# Patient Record
Sex: Female | Born: 1935 | Race: White | Hispanic: No | Smoking: Never smoker
Health system: Southern US, Community
[De-identification: ages and names within clinical notes are randomized; demographics above are authoritative.]

## PROBLEM LIST (undated history)

## (undated) DIAGNOSIS — N183 Chronic kidney disease, stage 3 (moderate): Secondary | ICD-10-CM

## (undated) DIAGNOSIS — K859 Acute pancreatitis without necrosis or infection, unspecified: Secondary | ICD-10-CM

## (undated) DIAGNOSIS — N185 Chronic kidney disease, stage 5: Secondary | ICD-10-CM

## (undated) DIAGNOSIS — I2510 Atherosclerotic heart disease of native coronary artery without angina pectoris: Secondary | ICD-10-CM

## (undated) DIAGNOSIS — M109 Gout, unspecified: Secondary | ICD-10-CM

## (undated) DIAGNOSIS — E119 Type 2 diabetes mellitus without complications: Secondary | ICD-10-CM

## (undated) DIAGNOSIS — I429 Cardiomyopathy, unspecified: Secondary | ICD-10-CM

## (undated) DIAGNOSIS — I4891 Unspecified atrial fibrillation: Secondary | ICD-10-CM

## (undated) DIAGNOSIS — IMO0002 Reserved for concepts with insufficient information to code with codable children: Secondary | ICD-10-CM

## (undated) DIAGNOSIS — I10 Essential (primary) hypertension: Secondary | ICD-10-CM

## (undated) HISTORY — DX: Type 2 diabetes mellitus without complications: E11.9

## (undated) HISTORY — DX: Unspecified atrial fibrillation: I48.91

## (undated) HISTORY — DX: Atherosclerotic heart disease of native coronary artery without angina pectoris: I25.10

## (undated) HISTORY — PX: TONSILLECTOMY: SUR1361

## (undated) HISTORY — DX: Chronic kidney disease, stage 5: N18.5

## (undated) HISTORY — PX: CHOLECYSTECTOMY: SHX55

## (undated) HISTORY — DX: Chronic kidney disease, stage 3 (moderate): N18.3

## (undated) HISTORY — DX: Cardiomyopathy, unspecified: I42.9

## (undated) HISTORY — DX: Essential (primary) hypertension: I10

---

## 2015-07-22 DIAGNOSIS — L89892 Pressure ulcer of other site, stage 2: Secondary | ICD-10-CM | POA: Diagnosis not present

## 2015-07-22 DIAGNOSIS — E1140 Type 2 diabetes mellitus with diabetic neuropathy, unspecified: Secondary | ICD-10-CM | POA: Diagnosis not present

## 2015-07-22 DIAGNOSIS — E1151 Type 2 diabetes mellitus with diabetic peripheral angiopathy without gangrene: Secondary | ICD-10-CM | POA: Diagnosis not present

## 2015-08-12 DIAGNOSIS — E1140 Type 2 diabetes mellitus with diabetic neuropathy, unspecified: Secondary | ICD-10-CM | POA: Diagnosis not present

## 2015-08-12 DIAGNOSIS — L89892 Pressure ulcer of other site, stage 2: Secondary | ICD-10-CM | POA: Diagnosis not present

## 2015-08-28 DIAGNOSIS — M109 Gout, unspecified: Secondary | ICD-10-CM | POA: Diagnosis not present

## 2015-08-28 DIAGNOSIS — E782 Mixed hyperlipidemia: Secondary | ICD-10-CM | POA: Diagnosis not present

## 2015-08-28 DIAGNOSIS — E1165 Type 2 diabetes mellitus with hyperglycemia: Secondary | ICD-10-CM | POA: Diagnosis not present

## 2015-08-28 DIAGNOSIS — I10 Essential (primary) hypertension: Secondary | ICD-10-CM | POA: Diagnosis not present

## 2015-09-04 DIAGNOSIS — I482 Chronic atrial fibrillation: Secondary | ICD-10-CM | POA: Diagnosis not present

## 2015-09-04 DIAGNOSIS — M545 Low back pain: Secondary | ICD-10-CM | POA: Diagnosis not present

## 2015-09-04 DIAGNOSIS — E782 Mixed hyperlipidemia: Secondary | ICD-10-CM | POA: Diagnosis not present

## 2015-09-04 DIAGNOSIS — R609 Edema, unspecified: Secondary | ICD-10-CM | POA: Diagnosis not present

## 2015-09-04 DIAGNOSIS — M109 Gout, unspecified: Secondary | ICD-10-CM | POA: Diagnosis not present

## 2015-09-04 DIAGNOSIS — E1165 Type 2 diabetes mellitus with hyperglycemia: Secondary | ICD-10-CM | POA: Diagnosis not present

## 2015-09-04 DIAGNOSIS — I10 Essential (primary) hypertension: Secondary | ICD-10-CM | POA: Diagnosis not present

## 2015-09-23 DIAGNOSIS — E1140 Type 2 diabetes mellitus with diabetic neuropathy, unspecified: Secondary | ICD-10-CM | POA: Diagnosis not present

## 2015-09-23 DIAGNOSIS — E1151 Type 2 diabetes mellitus with diabetic peripheral angiopathy without gangrene: Secondary | ICD-10-CM | POA: Diagnosis not present

## 2015-11-04 DIAGNOSIS — E1140 Type 2 diabetes mellitus with diabetic neuropathy, unspecified: Secondary | ICD-10-CM | POA: Diagnosis not present

## 2015-11-04 DIAGNOSIS — E1151 Type 2 diabetes mellitus with diabetic peripheral angiopathy without gangrene: Secondary | ICD-10-CM | POA: Diagnosis not present

## 2015-11-04 DIAGNOSIS — L89892 Pressure ulcer of other site, stage 2: Secondary | ICD-10-CM | POA: Diagnosis not present

## 2015-11-18 DIAGNOSIS — L89892 Pressure ulcer of other site, stage 2: Secondary | ICD-10-CM | POA: Diagnosis not present

## 2015-11-18 DIAGNOSIS — E1140 Type 2 diabetes mellitus with diabetic neuropathy, unspecified: Secondary | ICD-10-CM | POA: Diagnosis not present

## 2015-12-16 DIAGNOSIS — E1140 Type 2 diabetes mellitus with diabetic neuropathy, unspecified: Secondary | ICD-10-CM | POA: Diagnosis not present

## 2015-12-16 DIAGNOSIS — E1151 Type 2 diabetes mellitus with diabetic peripheral angiopathy without gangrene: Secondary | ICD-10-CM | POA: Diagnosis not present

## 2016-01-27 DIAGNOSIS — E1151 Type 2 diabetes mellitus with diabetic peripheral angiopathy without gangrene: Secondary | ICD-10-CM | POA: Diagnosis not present

## 2016-01-27 DIAGNOSIS — E1140 Type 2 diabetes mellitus with diabetic neuropathy, unspecified: Secondary | ICD-10-CM | POA: Diagnosis not present

## 2016-02-17 DIAGNOSIS — I10 Essential (primary) hypertension: Secondary | ICD-10-CM | POA: Diagnosis not present

## 2016-02-17 DIAGNOSIS — E782 Mixed hyperlipidemia: Secondary | ICD-10-CM | POA: Diagnosis not present

## 2016-02-17 DIAGNOSIS — I482 Chronic atrial fibrillation: Secondary | ICD-10-CM | POA: Diagnosis not present

## 2016-02-17 DIAGNOSIS — E1165 Type 2 diabetes mellitus with hyperglycemia: Secondary | ICD-10-CM | POA: Diagnosis not present

## 2016-02-19 DIAGNOSIS — I482 Chronic atrial fibrillation: Secondary | ICD-10-CM | POA: Diagnosis not present

## 2016-02-19 DIAGNOSIS — Z6841 Body Mass Index (BMI) 40.0 and over, adult: Secondary | ICD-10-CM | POA: Diagnosis not present

## 2016-02-19 DIAGNOSIS — M109 Gout, unspecified: Secondary | ICD-10-CM | POA: Diagnosis not present

## 2016-02-19 DIAGNOSIS — M545 Low back pain: Secondary | ICD-10-CM | POA: Diagnosis not present

## 2016-02-19 DIAGNOSIS — I10 Essential (primary) hypertension: Secondary | ICD-10-CM | POA: Diagnosis not present

## 2016-02-19 DIAGNOSIS — E782 Mixed hyperlipidemia: Secondary | ICD-10-CM | POA: Diagnosis not present

## 2016-02-19 DIAGNOSIS — R609 Edema, unspecified: Secondary | ICD-10-CM | POA: Diagnosis not present

## 2016-02-19 DIAGNOSIS — E1165 Type 2 diabetes mellitus with hyperglycemia: Secondary | ICD-10-CM | POA: Diagnosis not present

## 2016-02-26 DIAGNOSIS — H2513 Age-related nuclear cataract, bilateral: Secondary | ICD-10-CM | POA: Diagnosis not present

## 2016-02-26 DIAGNOSIS — E119 Type 2 diabetes mellitus without complications: Secondary | ICD-10-CM | POA: Diagnosis not present

## 2016-03-23 DIAGNOSIS — E1140 Type 2 diabetes mellitus with diabetic neuropathy, unspecified: Secondary | ICD-10-CM | POA: Diagnosis not present

## 2016-03-23 DIAGNOSIS — E1151 Type 2 diabetes mellitus with diabetic peripheral angiopathy without gangrene: Secondary | ICD-10-CM | POA: Diagnosis not present

## 2016-06-22 DIAGNOSIS — I10 Essential (primary) hypertension: Secondary | ICD-10-CM | POA: Diagnosis not present

## 2016-06-22 DIAGNOSIS — R609 Edema, unspecified: Secondary | ICD-10-CM | POA: Diagnosis not present

## 2016-06-22 DIAGNOSIS — I482 Chronic atrial fibrillation: Secondary | ICD-10-CM | POA: Diagnosis not present

## 2016-06-22 DIAGNOSIS — E782 Mixed hyperlipidemia: Secondary | ICD-10-CM | POA: Diagnosis not present

## 2016-06-22 DIAGNOSIS — E1165 Type 2 diabetes mellitus with hyperglycemia: Secondary | ICD-10-CM | POA: Diagnosis not present

## 2016-06-22 DIAGNOSIS — M109 Gout, unspecified: Secondary | ICD-10-CM | POA: Diagnosis not present

## 2016-06-24 DIAGNOSIS — I482 Chronic atrial fibrillation: Secondary | ICD-10-CM | POA: Diagnosis not present

## 2016-06-24 DIAGNOSIS — M109 Gout, unspecified: Secondary | ICD-10-CM | POA: Diagnosis not present

## 2016-06-24 DIAGNOSIS — E782 Mixed hyperlipidemia: Secondary | ICD-10-CM | POA: Diagnosis not present

## 2016-06-24 DIAGNOSIS — R609 Edema, unspecified: Secondary | ICD-10-CM | POA: Diagnosis not present

## 2016-06-24 DIAGNOSIS — E1165 Type 2 diabetes mellitus with hyperglycemia: Secondary | ICD-10-CM | POA: Diagnosis not present

## 2016-06-24 DIAGNOSIS — R079 Chest pain, unspecified: Secondary | ICD-10-CM | POA: Diagnosis not present

## 2016-06-24 DIAGNOSIS — M545 Low back pain: Secondary | ICD-10-CM | POA: Diagnosis not present

## 2016-06-24 DIAGNOSIS — I10 Essential (primary) hypertension: Secondary | ICD-10-CM | POA: Diagnosis not present

## 2016-08-26 DIAGNOSIS — I10 Essential (primary) hypertension: Secondary | ICD-10-CM | POA: Diagnosis not present

## 2016-08-26 DIAGNOSIS — E11621 Type 2 diabetes mellitus with foot ulcer: Secondary | ICD-10-CM | POA: Diagnosis not present

## 2016-08-29 ENCOUNTER — Inpatient Hospital Stay (HOSPITAL_COMMUNITY)
Admission: EM | Admit: 2016-08-29 | Discharge: 2016-09-16 | DRG: 246 | Disposition: A | Discharge status: Home Health | Payer: PPO | Attending: Family Medicine | Admitting: Family Medicine

## 2016-08-29 ENCOUNTER — Encounter (HOSPITAL_COMMUNITY): Payer: Self-pay | Admitting: Emergency Medicine

## 2016-08-29 ENCOUNTER — Emergency Department (HOSPITAL_COMMUNITY): Payer: PPO

## 2016-08-29 DIAGNOSIS — N184 Chronic kidney disease, stage 4 (severe): Secondary | ICD-10-CM | POA: Diagnosis not present

## 2016-08-29 DIAGNOSIS — Z79899 Other long term (current) drug therapy: Secondary | ICD-10-CM

## 2016-08-29 DIAGNOSIS — I25110 Atherosclerotic heart disease of native coronary artery with unstable angina pectoris: Principal | ICD-10-CM | POA: Diagnosis present

## 2016-08-29 DIAGNOSIS — I482 Chronic atrial fibrillation: Secondary | ICD-10-CM | POA: Diagnosis present

## 2016-08-29 DIAGNOSIS — E1151 Type 2 diabetes mellitus with diabetic peripheral angiopathy without gangrene: Secondary | ICD-10-CM | POA: Diagnosis not present

## 2016-08-29 DIAGNOSIS — N170 Acute kidney failure with tubular necrosis: Secondary | ICD-10-CM | POA: Diagnosis not present

## 2016-08-29 DIAGNOSIS — I248 Other forms of acute ischemic heart disease: Secondary | ICD-10-CM | POA: Diagnosis not present

## 2016-08-29 DIAGNOSIS — Z955 Presence of coronary angioplasty implant and graft: Secondary | ICD-10-CM

## 2016-08-29 DIAGNOSIS — I97630 Postprocedural hematoma of a circulatory system organ or structure following a cardiac catheterization: Secondary | ICD-10-CM | POA: Diagnosis not present

## 2016-08-29 DIAGNOSIS — D62 Acute posthemorrhagic anemia: Secondary | ICD-10-CM | POA: Diagnosis not present

## 2016-08-29 DIAGNOSIS — Z6841 Body Mass Index (BMI) 40.0 and over, adult: Secondary | ICD-10-CM

## 2016-08-29 DIAGNOSIS — I509 Heart failure, unspecified: Secondary | ICD-10-CM

## 2016-08-29 DIAGNOSIS — I5021 Acute systolic (congestive) heart failure: Secondary | ICD-10-CM

## 2016-08-29 DIAGNOSIS — I2583 Coronary atherosclerosis due to lipid rich plaque: Secondary | ICD-10-CM | POA: Diagnosis not present

## 2016-08-29 DIAGNOSIS — I959 Hypotension, unspecified: Secondary | ICD-10-CM | POA: Diagnosis not present

## 2016-08-29 DIAGNOSIS — I4819 Other persistent atrial fibrillation: Secondary | ICD-10-CM | POA: Diagnosis present

## 2016-08-29 DIAGNOSIS — R262 Difficulty in walking, not elsewhere classified: Secondary | ICD-10-CM | POA: Diagnosis not present

## 2016-08-29 DIAGNOSIS — R0602 Shortness of breath: Secondary | ICD-10-CM | POA: Diagnosis not present

## 2016-08-29 DIAGNOSIS — Z7901 Long term (current) use of anticoagulants: Secondary | ICD-10-CM

## 2016-08-29 DIAGNOSIS — E1165 Type 2 diabetes mellitus with hyperglycemia: Secondary | ICD-10-CM | POA: Diagnosis not present

## 2016-08-29 DIAGNOSIS — R609 Edema, unspecified: Secondary | ICD-10-CM | POA: Diagnosis not present

## 2016-08-29 DIAGNOSIS — I130 Hypertensive heart and chronic kidney disease with heart failure and stage 1 through stage 4 chronic kidney disease, or unspecified chronic kidney disease: Secondary | ICD-10-CM | POA: Diagnosis present

## 2016-08-29 DIAGNOSIS — I5043 Acute on chronic combined systolic (congestive) and diastolic (congestive) heart failure: Secondary | ICD-10-CM | POA: Diagnosis present

## 2016-08-29 DIAGNOSIS — E1159 Type 2 diabetes mellitus with other circulatory complications: Secondary | ICD-10-CM | POA: Diagnosis not present

## 2016-08-29 DIAGNOSIS — L97509 Non-pressure chronic ulcer of other part of unspecified foot with unspecified severity: Secondary | ICD-10-CM

## 2016-08-29 DIAGNOSIS — Z7902 Long term (current) use of antithrombotics/antiplatelets: Secondary | ICD-10-CM

## 2016-08-29 DIAGNOSIS — R0600 Dyspnea, unspecified: Secondary | ICD-10-CM | POA: Diagnosis not present

## 2016-08-29 DIAGNOSIS — Z794 Long term (current) use of insulin: Secondary | ICD-10-CM | POA: Diagnosis not present

## 2016-08-29 DIAGNOSIS — I200 Unstable angina: Secondary | ICD-10-CM

## 2016-08-29 DIAGNOSIS — Z8249 Family history of ischemic heart disease and other diseases of the circulatory system: Secondary | ICD-10-CM

## 2016-08-29 DIAGNOSIS — I70208 Unspecified atherosclerosis of native arteries of extremities, other extremity: Secondary | ICD-10-CM | POA: Diagnosis not present

## 2016-08-29 DIAGNOSIS — S7001XA Contusion of right hip, initial encounter: Secondary | ICD-10-CM | POA: Diagnosis not present

## 2016-08-29 DIAGNOSIS — L97519 Non-pressure chronic ulcer of other part of right foot with unspecified severity: Secondary | ICD-10-CM | POA: Diagnosis present

## 2016-08-29 DIAGNOSIS — E1122 Type 2 diabetes mellitus with diabetic chronic kidney disease: Secondary | ICD-10-CM | POA: Diagnosis not present

## 2016-08-29 DIAGNOSIS — I481 Persistent atrial fibrillation: Secondary | ICD-10-CM | POA: Diagnosis not present

## 2016-08-29 DIAGNOSIS — I9789 Other postprocedural complications and disorders of the circulatory system, not elsewhere classified: Secondary | ICD-10-CM | POA: Diagnosis not present

## 2016-08-29 DIAGNOSIS — E1169 Type 2 diabetes mellitus with other specified complication: Secondary | ICD-10-CM

## 2016-08-29 DIAGNOSIS — I2510 Atherosclerotic heart disease of native coronary artery without angina pectoris: Secondary | ICD-10-CM

## 2016-08-29 DIAGNOSIS — R197 Diarrhea, unspecified: Secondary | ICD-10-CM | POA: Diagnosis not present

## 2016-08-29 DIAGNOSIS — N179 Acute kidney failure, unspecified: Secondary | ICD-10-CM | POA: Diagnosis not present

## 2016-08-29 DIAGNOSIS — Z23 Encounter for immunization: Secondary | ICD-10-CM | POA: Diagnosis not present

## 2016-08-29 DIAGNOSIS — I5023 Acute on chronic systolic (congestive) heart failure: Secondary | ICD-10-CM | POA: Diagnosis not present

## 2016-08-29 DIAGNOSIS — S301XXA Contusion of abdominal wall, initial encounter: Secondary | ICD-10-CM | POA: Diagnosis not present

## 2016-08-29 DIAGNOSIS — I97410 Intraoperative hemorrhage and hematoma of a circulatory system organ or structure complicating a cardiac catheterization: Secondary | ICD-10-CM | POA: Diagnosis not present

## 2016-08-29 DIAGNOSIS — N183 Chronic kidney disease, stage 3 (moderate): Secondary | ICD-10-CM | POA: Diagnosis not present

## 2016-08-29 DIAGNOSIS — M109 Gout, unspecified: Secondary | ICD-10-CM | POA: Diagnosis present

## 2016-08-29 DIAGNOSIS — R0601 Orthopnea: Secondary | ICD-10-CM | POA: Diagnosis not present

## 2016-08-29 DIAGNOSIS — E7800 Pure hypercholesterolemia, unspecified: Secondary | ICD-10-CM | POA: Diagnosis not present

## 2016-08-29 DIAGNOSIS — E11621 Type 2 diabetes mellitus with foot ulcer: Secondary | ICD-10-CM | POA: Diagnosis not present

## 2016-08-29 DIAGNOSIS — E119 Type 2 diabetes mellitus without complications: Secondary | ICD-10-CM

## 2016-08-29 DIAGNOSIS — I252 Old myocardial infarction: Secondary | ICD-10-CM

## 2016-08-29 DIAGNOSIS — Z7982 Long term (current) use of aspirin: Secondary | ICD-10-CM

## 2016-08-29 HISTORY — DX: Acute pancreatitis without necrosis or infection, unspecified: K85.90

## 2016-08-29 HISTORY — DX: Gout, unspecified: M10.9

## 2016-08-29 HISTORY — DX: Reserved for concepts with insufficient information to code with codable children: IMO0002

## 2016-08-29 LAB — CBC
HCT: 42.6 % (ref 36.0–46.0)
Hemoglobin: 13.8 g/dL (ref 12.0–15.0)
MCH: 28.5 pg (ref 26.0–34.0)
MCHC: 32.4 g/dL (ref 30.0–36.0)
MCV: 87.8 fL (ref 78.0–100.0)
PLATELETS: 245 10*3/uL (ref 150–400)
RBC: 4.85 MIL/uL (ref 3.87–5.11)
RDW: 14.7 % (ref 11.5–15.5)
WBC: 10.1 10*3/uL (ref 4.0–10.5)

## 2016-08-29 LAB — I-STAT TROPONIN, ED: TROPONIN I, POC: 0.03 ng/mL (ref 0.00–0.08)

## 2016-08-29 LAB — BASIC METABOLIC PANEL
Anion gap: 10 (ref 5–15)
BUN: 35 mg/dL — ABNORMAL HIGH (ref 6–20)
CALCIUM: 9.2 mg/dL (ref 8.9–10.3)
CO2: 21 mmol/L — ABNORMAL LOW (ref 22–32)
CREATININE: 1.64 mg/dL — AB (ref 0.44–1.00)
Chloride: 103 mmol/L (ref 101–111)
GFR, EST AFRICAN AMERICAN: 33 mL/min — AB (ref >60)
GFR, EST NON AFRICAN AMERICAN: 29 mL/min — AB (ref >60)
Glucose, Bld: 285 mg/dL — ABNORMAL HIGH (ref 65–99)
Potassium: 3.9 mmol/L (ref 3.5–5.1)
SODIUM: 134 mmol/L — AB (ref 135–145)

## 2016-08-29 LAB — BRAIN NATRIURETIC PEPTIDE: B Natriuretic Peptide: 862.2 pg/mL — ABNORMAL HIGH (ref 0.0–100.0)

## 2016-08-29 MED ORDER — NITROGLYCERIN 2 % TD OINT
1.0000 [in_us] | TOPICAL_OINTMENT | Freq: Once | TRANSDERMAL | Status: AC
  Administered 2016-08-29: 1 [in_us] via TOPICAL
  Filled 2016-08-29: qty 1

## 2016-08-29 MED ORDER — FUROSEMIDE 10 MG/ML IJ SOLN
40.0000 mg | Freq: Once | INTRAMUSCULAR | Status: AC
  Administered 2016-08-29: 40 mg via INTRAVENOUS
  Filled 2016-08-29: qty 4

## 2016-08-29 MED ORDER — METOPROLOL TARTRATE 12.5 MG HALF TABLET
12.5000 mg | ORAL_TABLET | Freq: Four times a day (QID) | ORAL | Status: DC
  Administered 2016-08-30 (×2): 12.5 mg via ORAL
  Filled 2016-08-29 (×2): qty 1

## 2016-08-29 MED ORDER — ONDANSETRON HCL 4 MG/2ML IJ SOLN: 4.0000 mg | Freq: Four times a day (QID) | INTRAMUSCULAR | Status: DC | PRN

## 2016-08-29 MED ORDER — SODIUM CHLORIDE 0.9 % IV SOLN: 250.0000 mL | INTRAVENOUS | Status: DC | PRN

## 2016-08-29 MED ORDER — INSULIN GLARGINE 100 UNIT/ML ~~LOC~~ SOLN
10.0000 [IU] | Freq: Every day | SUBCUTANEOUS | Status: DC
  Administered 2016-08-30: 10 [IU] via SUBCUTANEOUS
  Filled 2016-08-29: qty 0.1

## 2016-08-29 MED ORDER — SODIUM CHLORIDE 0.9% FLUSH
3.0000 mL | Freq: Two times a day (BID) | INTRAVENOUS | Status: DC
  Administered 2016-08-30 – 2016-09-07 (×12): 3 mL via INTRAVENOUS

## 2016-08-29 MED ORDER — SODIUM CHLORIDE 0.9% FLUSH: 3.0000 mL | INTRAVENOUS | Status: DC | PRN

## 2016-08-29 MED ORDER — ALLOPURINOL 300 MG PO TABS
300.0000 mg | ORAL_TABLET | Freq: Every day | ORAL | Status: DC
  Administered 2016-08-30 – 2016-09-16 (×17): 300 mg via ORAL
  Filled 2016-08-29 (×17): qty 1

## 2016-08-29 MED ORDER — ACETAMINOPHEN 325 MG PO TABS: 650.0000 mg | ORAL_TABLET | ORAL | Status: DC | PRN

## 2016-08-29 MED ORDER — APIXABAN 5 MG PO TABS: 5.0000 mg | ORAL_TABLET | Freq: Every day | ORAL | Status: DC

## 2016-08-29 MED ORDER — APIXABAN 2.5 MG PO TABS
2.5000 mg | ORAL_TABLET | Freq: Two times a day (BID) | ORAL | Status: DC
  Administered 2016-08-30 – 2016-08-31 (×4): 2.5 mg via ORAL
  Filled 2016-08-29 (×4): qty 1

## 2016-08-29 MED ORDER — FUROSEMIDE 10 MG/ML IJ SOLN
40.0000 mg | Freq: Every day | INTRAMUSCULAR | Status: DC
  Administered 2016-08-30: 40 mg via INTRAVENOUS
  Filled 2016-08-29: qty 4

## 2016-08-29 MED ORDER — INSULIN ASPART 100 UNIT/ML ~~LOC~~ SOLN
0.0000 [IU] | Freq: Three times a day (TID) | SUBCUTANEOUS | Status: DC
  Administered 2016-08-30: 3 [IU] via SUBCUTANEOUS
  Administered 2016-08-30: 7 [IU] via SUBCUTANEOUS
  Administered 2016-08-30 – 2016-08-31 (×3): 3 [IU] via SUBCUTANEOUS
  Administered 2016-08-31: 5 [IU] via SUBCUTANEOUS
  Administered 2016-09-01: 3 [IU] via SUBCUTANEOUS

## 2016-08-29 NOTE — ED Provider Notes (Signed)
[facility] DEPT Provider Note   CSN: [id number] Arrival date & time: 08/29/16  1844     History   Chief Complaint Chief Complaint  Patient presents with  . Shortness of Breath  . Leg Swelling    HPI Kristina Howell is a 81 y.o. female.  This a 81 year old female who is morbidly obese with number of chronic medical conditions which include hypertension, diabetes, atrial fibrillation who reports that for the past 3 weeks.  She's had progressively increasing shortness of breath.  She's had to use more pillows at night to sleep comfortably.  She states for the past 2 days.  She's been unable to ambulate due to the shortness of breath. She does have a diabetic ulcer on her right foot and has been referred to the wound care clinic by her PCP.       Past Medical History:  Diagnosis Date  . A-fib ([place])   . Diabetes mellitus without complication ([place])   . Gout   . Hypertension   . Pancreatitis, recurrent [facility])     Patient Active Problem List   Diagnosis Date Noted  . Coronary artery disease involving native coronary artery of native heart with unstable angina pectoris ([place])   . Coronary artery disease due to lipid rich plaque   . Chronic anticoagulation   . Acute systolic heart failure ([place])   . Unstable angina ([place])   . Type 2 diabetes mellitus with diabetic foot ulcer ([place]) 08/30/2016  . Acute kidney injury ([place]) 08/30/2016  . Dyspnea 08/30/2016  . Peripheral edema   . Diabetes mellitus ([place])   . Persistent atrial fibrillation ([place])   . Morbidly obese [facility])     Past Surgical History:  Procedure Laterality Date  . CHOLECYSTECTOMY    . CORONARY STENT INTERVENTION N/A 09/08/2016   Procedure: Coronary Stent Intervention;  Surgeon: Belva Crome, MD;  Location: [facility] CV LAB;  Service: Cardiovascular;  Laterality: N/A;  . LEFT HEART CATH AND CORONARY ANGIOGRAPHY N/A 09/03/2016   Procedure: Left Heart Cath and Coronary Angiography;  Surgeon: Nelva Bush, MD;   Location: [place] CV LAB;  Service: Cardiovascular;  Laterality: N/A;  . TONSILLECTOMY      OB History    No data available       Home Medications    Prior to Admission medications   Medication Sig Start Date End Date Taking? Authorizing Provider  allopurinol (ZYLOPRIM) 300 MG tablet Take 300 mg by mouth daily.   Yes Historical Provider, MD  apixaban (ELIQUIS) 5 MG TABS tablet Take 5 mg by mouth daily.   Yes Historical Provider, MD  benazepril (LOTENSIN) 40 MG tablet Take 40 mg by mouth daily. 08/26/16  Yes Historical Provider, MD  furosemide (LASIX) 20 MG tablet Take 20 mg by mouth daily. 08/11/16  Yes Historical Provider, MD  insulin glargine (LANTUS) 100 UNIT/ML injection Inject 20 Units into the skin at bedtime.   Yes Historical Provider, MD  metoprolol succinate (TOPROL-XL) 50 MG 24 hr tablet Take 50 mg by mouth at bedtime. 08/26/16  Yes Historical Provider, MD    Family History Family History  Problem Relation Age of Onset  . Heart disease Father     Social History Social History  Substance Use Topics  . Smoking status: Never Smoker  . Smokeless tobacco: Never Used  . Alcohol use No     Allergies   Patient has no known allergies.   Review of Systems Review of Systems  Constitutional: Negative for fever.  Respiratory: Positive for shortness of breath. Negative for cough.   Cardiovascular: Positive for leg swelling. Negative for chest pain.  Skin: Positive for wound.  All other systems reviewed and are negative.    Physical Exam Updated Vital Signs BP (!) 122/45   Pulse 73   Temp 97.6 F (36.4 C) (Oral)   Resp (!) 22   Ht 5\' 6"  (1.676 m)   Wt 129.6 kg   SpO2 99%   BMI 46.12 kg/m   Physical Exam  Constitutional: She appears well-developed and well-nourished.  Eyes: Pupils are equal, round, and reactive to light.  Cardiovascular: Normal rate.   Pulmonary/Chest: Effort normal.  Musculoskeletal: She exhibits edema and tenderness.        Legs: Neurological: She is alert.  Skin: Skin is warm. There is pallor.  Nursing note and vitals reviewed.    ED Treatments / Results  Labs (all labs ordered are listed, but only abnormal results are displayed) Labs Reviewed  BASIC METABOLIC PANEL - Abnormal; Notable for the following:       Result Value   Sodium 134 (*)    CO2 21 (*)    Glucose, Bld 285 (*)    BUN 35 (*)    Creatinine, Ser 1.64 (*)    GFR calc non Af Amer 29 (*)    GFR calc Af Amer 33 (*)    All other components within normal limits  BRAIN NATRIURETIC PEPTIDE - Abnormal; Notable for the following:    B Natriuretic Peptide 862.2 (*)    All other components within normal limits  BASIC METABOLIC PANEL - Abnormal; Notable for the following:    Potassium 3.4 (*)    Glucose, Bld 246 (*)    BUN 32 (*)    Creatinine, Ser 1.58 (*)    GFR calc non Af Amer 30 (*)    GFR calc Af Amer 35 (*)    All other components within normal limits  HEMOGLOBIN A1C - Abnormal; Notable for the following:    Hgb A1c MFr Bld 10.3 (*)    All other components within normal limits  TROPONIN I - Abnormal; Notable for the following:    Troponin I 0.05 (*)    All other components within normal limits  TROPONIN I - Abnormal; Notable for the following:    Troponin I 0.05 (*)    All other components within normal limits  LIPID PANEL - Abnormal; Notable for the following:    Cholesterol 242 (*)    HDL 36 (*)    LDL Cholesterol 180 (*)    All other components within normal limits  GLUCOSE, CAPILLARY - Abnormal; Notable for the following:    Glucose-Capillary 206 (*)    All other components within normal limits  GLUCOSE, CAPILLARY - Abnormal; Notable for the following:    Glucose-Capillary 288 (*)    All other components within normal limits  GLUCOSE, CAPILLARY - Abnormal; Notable for the following:    Glucose-Capillary 240 (*)    All other components within normal limits  GLUCOSE, CAPILLARY - Abnormal; Notable for the following:     Glucose-Capillary 302 (*)    All other components within normal limits  BASIC METABOLIC PANEL - Abnormal; Notable for the following:    Glucose, Bld 216 (*)    BUN 32 (*)    Creatinine, Ser 1.72 (*)    GFR calc non Af Amer 27 (*)    GFR calc Af Amer 32 (*)    All other  components within normal limits  GLUCOSE, CAPILLARY - Abnormal; Notable for the following:    Glucose-Capillary 314 (*)    All other components within normal limits  GLUCOSE, CAPILLARY - Abnormal; Notable for the following:    Glucose-Capillary 213 (*)    All other components within normal limits  GLUCOSE, CAPILLARY - Abnormal; Notable for the following:    Glucose-Capillary 254 (*)    All other components within normal limits  BASIC METABOLIC PANEL - Abnormal; Notable for the following:    Glucose, Bld 271 (*)    BUN 38 (*)    Creatinine, Ser 1.87 (*)    Calcium 8.8 (*)    GFR calc non Af Amer 25 (*)    GFR calc Af Amer 29 (*)    All other components within normal limits  HEPARIN LEVEL (UNFRACTIONATED) - Abnormal; Notable for the following:    Heparin Unfractionated 1.05 (*)    All other components within normal limits  GLUCOSE, CAPILLARY - Abnormal; Notable for the following:    Glucose-Capillary 230 (*)    All other components within normal limits  GLUCOSE, CAPILLARY - Abnormal; Notable for the following:    Glucose-Capillary 388 (*)    All other components within normal limits  APTT - Abnormal; Notable for the following:    aPTT 48 (*)    All other components within normal limits  APTT - Abnormal; Notable for the following:    aPTT 114 (*)    All other components within normal limits  GLUCOSE, CAPILLARY - Abnormal; Notable for the following:    Glucose-Capillary 250 (*)    All other components within normal limits  GLUCOSE, CAPILLARY - Abnormal; Notable for the following:    Glucose-Capillary 246 (*)    All other components within normal limits  APTT - Abnormal; Notable for the following:    aPTT 61  (*)    All other components within normal limits  GLUCOSE, CAPILLARY - Abnormal; Notable for the following:    Glucose-Capillary 278 (*)    All other components within normal limits  HEPARIN LEVEL (UNFRACTIONATED) - Abnormal; Notable for the following:    Heparin Unfractionated 0.99 (*)    All other components within normal limits  GLUCOSE, CAPILLARY - Abnormal; Notable for the following:    Glucose-Capillary 283 (*)    All other components within normal limits  APTT - Abnormal; Notable for the following:    aPTT 139 (*)    All other components within normal limits  GLUCOSE, CAPILLARY - Abnormal; Notable for the following:    Glucose-Capillary 208 (*)    All other components within normal limits  APTT - Abnormal; Notable for the following:    aPTT 122 (*)    All other components within normal limits  HEPARIN LEVEL (UNFRACTIONATED) - Abnormal; Notable for the following:    Heparin Unfractionated 0.90 (*)    All other components within normal limits  BASIC METABOLIC PANEL - Abnormal; Notable for the following:    Glucose, Bld 230 (*)    BUN 39 (*)    Creatinine, Ser 1.91 (*)    GFR calc non Af Amer 24 (*)    GFR calc Af Amer 28 (*)    All other components within normal limits  GLUCOSE, CAPILLARY - Abnormal; Notable for the following:    Glucose-Capillary 194 (*)    All other components within normal limits  APTT - Abnormal; Notable for the following:    aPTT 119 (*)  All other components within normal limits  GLUCOSE, CAPILLARY - Abnormal; Notable for the following:    Glucose-Capillary 242 (*)    All other components within normal limits  BASIC METABOLIC PANEL - Abnormal; Notable for the following:    Glucose, Bld 262 (*)    BUN 42 (*)    Creatinine, Ser 1.82 (*)    Calcium 8.6 (*)    GFR calc non Af Amer 25 (*)    GFR calc Af Amer 30 (*)    All other components within normal limits  HEPARIN LEVEL (UNFRACTIONATED) - Abnormal; Notable for the following:    Heparin  Unfractionated 0.74 (*)    All other components within normal limits  CBC - Abnormal; Notable for the following:    Hemoglobin 11.7 (*)    All other components within normal limits  HEPATIC FUNCTION PANEL - Abnormal; Notable for the following:    Total Protein 5.5 (*)    Albumin 2.5 (*)    Bilirubin, Direct <0.1 (*)    All other components within normal limits  GLUCOSE, CAPILLARY - Abnormal; Notable for the following:    Glucose-Capillary 244 (*)    All other components within normal limits  GLUCOSE, CAPILLARY - Abnormal; Notable for the following:    Glucose-Capillary 259 (*)    All other components within normal limits  APTT - Abnormal; Notable for the following:    aPTT 87 (*)    All other components within normal limits  BASIC METABOLIC PANEL - Abnormal; Notable for the following:    Sodium 134 (*)    CO2 21 (*)    Glucose, Bld 249 (*)    BUN 40 (*)    Creatinine, Ser 1.78 (*)    Calcium 8.8 (*)    GFR calc non Af Amer 26 (*)    GFR calc Af Amer 30 (*)    All other components within normal limits  GLUCOSE, CAPILLARY - Abnormal; Notable for the following:    Glucose-Capillary 242 (*)    All other components within normal limits  GLUCOSE, CAPILLARY - Abnormal; Notable for the following:    Glucose-Capillary 225 (*)    All other components within normal limits  GLUCOSE, CAPILLARY - Abnormal; Notable for the following:    Glucose-Capillary 169 (*)    All other components within normal limits  HEPARIN LEVEL (UNFRACTIONATED) - Abnormal; Notable for the following:    Heparin Unfractionated 0.19 (*)    All other components within normal limits  APTT - Abnormal; Notable for the following:    aPTT 49 (*)    All other components within normal limits  GLUCOSE, CAPILLARY - Abnormal; Notable for the following:    Glucose-Capillary 256 (*)    All other components within normal limits  CBC - Abnormal; Notable for the following:    Hemoglobin 11.9 (*)    All other components  within normal limits  BASIC METABOLIC PANEL - Abnormal; Notable for the following:    Glucose, Bld 207 (*)    BUN 36 (*)    Creatinine, Ser 1.78 (*)    GFR calc non Af Amer 26 (*)    GFR calc Af Amer 30 (*)    All other components within normal limits  GLUCOSE, CAPILLARY - Abnormal; Notable for the following:    Glucose-Capillary 227 (*)    All other components within normal limits  HEPARIN LEVEL (UNFRACTIONATED) - Abnormal; Notable for the following:    Heparin Unfractionated 0.29 (*)  All other components within normal limits  GLUCOSE, CAPILLARY - Abnormal; Notable for the following:    Glucose-Capillary 186 (*)    All other components within normal limits  GLUCOSE, CAPILLARY - Abnormal; Notable for the following:    Glucose-Capillary 230 (*)    All other components within normal limits  GLUCOSE, CAPILLARY - Abnormal; Notable for the following:    Glucose-Capillary 251 (*)    All other components within normal limits  BASIC METABOLIC PANEL - Abnormal; Notable for the following:    Glucose, Bld 211 (*)    BUN 40 (*)    Creatinine, Ser 2.05 (*)    GFR calc non Af Amer 22 (*)    GFR calc Af Amer 26 (*)    All other components within normal limits  GLUCOSE, CAPILLARY - Abnormal; Notable for the following:    Glucose-Capillary 271 (*)    All other components within normal limits  GLUCOSE, CAPILLARY - Abnormal; Notable for the following:    Glucose-Capillary 196 (*)    All other components within normal limits  GLUCOSE, CAPILLARY - Abnormal; Notable for the following:    Glucose-Capillary 272 (*)    All other components within normal limits  GLUCOSE, CAPILLARY - Abnormal; Notable for the following:    Glucose-Capillary 278 (*)    All other components within normal limits  GLUCOSE, CAPILLARY - Abnormal; Notable for the following:    Glucose-Capillary 287 (*)    All other components within normal limits  GLUCOSE, CAPILLARY - Abnormal; Notable for the following:     Glucose-Capillary 212 (*)    All other components within normal limits  BASIC METABOLIC PANEL - Abnormal; Notable for the following:    Chloride 100 (*)    Glucose, Bld 258 (*)    BUN 37 (*)    Creatinine, Ser 1.92 (*)    GFR calc non Af Amer 24 (*)    GFR calc Af Amer 28 (*)    All other components within normal limits  BASIC METABOLIC PANEL - Abnormal; Notable for the following:    Chloride 98 (*)    Glucose, Bld 295 (*)    BUN 39 (*)    Creatinine, Ser 2.08 (*)    GFR calc non Af Amer 22 (*)    GFR calc Af Amer 25 (*)    All other components within normal limits  GLUCOSE, CAPILLARY - Abnormal; Notable for the following:    Glucose-Capillary 244 (*)    All other components within normal limits  GLUCOSE, CAPILLARY - Abnormal; Notable for the following:    Glucose-Capillary 316 (*)    All other components within normal limits  BASIC METABOLIC PANEL - Abnormal; Notable for the following:    Chloride 100 (*)    Glucose, Bld 165 (*)    BUN 36 (*)    Creatinine, Ser 1.78 (*)    GFR calc non Af Amer 26 (*)    GFR calc Af Amer 30 (*)    All other components within normal limits  CBC - Abnormal; Notable for the following:    Hemoglobin 11.9 (*)    All other components within normal limits  GLUCOSE, CAPILLARY - Abnormal; Notable for the following:    Glucose-Capillary 219 (*)    All other components within normal limits  GLUCOSE, CAPILLARY - Abnormal; Notable for the following:    Glucose-Capillary 141 (*)    All other components within normal limits  GLUCOSE, CAPILLARY - Abnormal; Notable for the  following:    Glucose-Capillary 174 (*)    All other components within normal limits  BASIC METABOLIC PANEL - Abnormal; Notable for the following:    Glucose, Bld 210 (*)    BUN 32 (*)    Creatinine, Ser 1.84 (*)    GFR calc non Af Amer 25 (*)    GFR calc Af Amer 29 (*)    All other components within normal limits  CBC - Abnormal; Notable for the following:    Hemoglobin 11.5 (*)     All other components within normal limits  GLUCOSE, CAPILLARY - Abnormal; Notable for the following:    Glucose-Capillary 263 (*)    All other components within normal limits  GLUCOSE, CAPILLARY - Abnormal; Notable for the following:    Glucose-Capillary 278 (*)    All other components within normal limits  GLUCOSE, CAPILLARY - Abnormal; Notable for the following:    Glucose-Capillary 189 (*)    All other components within normal limits  GLUCOSE, CAPILLARY - Abnormal; Notable for the following:    Glucose-Capillary 187 (*)    All other components within normal limits  CBC - Abnormal; Notable for the following:    WBC 17.1 (*)    RBC 3.38 (*)    Hemoglobin 9.6 (*)    HCT 29.9 (*)    All other components within normal limits  BASIC METABOLIC PANEL - Abnormal; Notable for the following:    CO2 21 (*)    Glucose, Bld 269 (*)    BUN 35 (*)    Creatinine, Ser 2.14 (*)    Calcium 8.8 (*)    GFR calc non Af Amer 21 (*)    GFR calc Af Amer 24 (*)    All other components within normal limits  CBC - Abnormal; Notable for the following:    WBC 15.0 (*)    RBC 3.72 (*)    Hemoglobin 10.5 (*)    HCT 33.1 (*)    All other components within normal limits  GLUCOSE, CAPILLARY - Abnormal; Notable for the following:    Glucose-Capillary 255 (*)    All other components within normal limits  GLUCOSE, CAPILLARY - Abnormal; Notable for the following:    Glucose-Capillary 274 (*)    All other components within normal limits  CBC WITH DIFFERENTIAL/PLATELET - Abnormal; Notable for the following:    WBC 13.2 (*)    RBC 3.16 (*)    Hemoglobin 9.0 (*)    HCT 27.8 (*)    Neutro Abs 11.1 (*)    All other components within normal limits  BASIC METABOLIC PANEL - Abnormal; Notable for the following:    Glucose, Bld 213 (*)    BUN 38 (*)    Creatinine, Ser 2.32 (*)    Calcium 8.7 (*)    GFR calc non Af Amer 19 (*)    GFR calc Af Amer 22 (*)    All other components within normal limits  CBC -  Abnormal; Notable for the following:    WBC 12.6 (*)    RBC 2.99 (*)    Hemoglobin 8.5 (*)    HCT 26.2 (*)    All other components within normal limits  GLUCOSE, CAPILLARY - Abnormal; Notable for the following:    Glucose-Capillary 217 (*)    All other components within normal limits  CBC - Abnormal; Notable for the following:    WBC 13.4 (*)    RBC 2.64 (*)    Hemoglobin 7.5 (*)  HCT 23.3 (*)    RDW 16.0 (*)    All other components within normal limits  GLUCOSE, CAPILLARY - Abnormal; Notable for the following:    Glucose-Capillary 171 (*)    All other components within normal limits  GLUCOSE, CAPILLARY - Abnormal; Notable for the following:    Glucose-Capillary 276 (*)    All other components within normal limits  CBC - Abnormal; Notable for the following:    WBC 12.2 (*)    RBC 2.85 (*)    Hemoglobin 8.2 (*)    HCT 25.1 (*)    All other components within normal limits  BASIC METABOLIC PANEL - Abnormal; Notable for the following:    Sodium 134 (*)    Glucose, Bld 247 (*)    BUN 44 (*)    Creatinine, Ser 2.41 (*)    Calcium 8.6 (*)    GFR calc non Af Amer 18 (*)    GFR calc Af Amer 21 (*)    All other components within normal limits  HEMOGLOBIN AND HEMATOCRIT, BLOOD - Abnormal; Notable for the following:    Hemoglobin 8.7 (*)    HCT 27.1 (*)    All other components within normal limits  GLUCOSE, CAPILLARY - Abnormal; Notable for the following:    Glucose-Capillary 290 (*)    All other components within normal limits  BASIC METABOLIC PANEL - Abnormal; Notable for the following:    Glucose, Bld 203 (*)    BUN 42 (*)    Creatinine, Ser 2.44 (*)    Calcium 8.6 (*)    GFR calc non Af Amer 18 (*)    GFR calc Af Amer 21 (*)    All other components within normal limits  GLUCOSE, CAPILLARY - Abnormal; Notable for the following:    Glucose-Capillary 205 (*)    All other components within normal limits  HEMOGLOBIN AND HEMATOCRIT, BLOOD - Abnormal; Notable for the  following:    Hemoglobin 8.8 (*)    HCT 26.7 (*)    All other components within normal limits  GLUCOSE, CAPILLARY - Abnormal; Notable for the following:    Glucose-Capillary 275 (*)    All other components within normal limits  GLUCOSE, CAPILLARY - Abnormal; Notable for the following:    Glucose-Capillary 219 (*)    All other components within normal limits  MRSA PCR SCREENING  CBC  CBC  CBC  HEPARIN LEVEL (UNFRACTIONATED)  CBC  PROTIME-INR  HEPARIN LEVEL (UNFRACTIONATED)  CBC  HEPARIN LEVEL (UNFRACTIONATED)  HEPARIN LEVEL (UNFRACTIONATED)  CBC  HEPARIN LEVEL (UNFRACTIONATED)  PROTIME-INR  HEPARIN LEVEL (UNFRACTIONATED)  I-STAT TROPOININ, ED  POCT ACTIVATED CLOTTING TIME  POCT ACTIVATED CLOTTING TIME  POCT ACTIVATED CLOTTING TIME  POCT ACTIVATED CLOTTING TIME  TYPE AND SCREEN  ABO/RH  PREPARE RBC (CROSSMATCH)    EKG  EKG Interpretation  Date/Time:  Sunday August 29 2016 19:06:50 EST Ventricular Rate:  86 PR Interval:    QRS Duration: 88 QT Interval:  376 QTC Calculation: 449 R Axis:   80 Text Interpretation:  Atrial fibrillation Anteroseptal infarct , age undetermined Abnormal ECG No previous tracing Confirmed by KNOTT MD, DANIEL ([id number]) on 08/29/2016 7:39:35 PM       Radiology No results found.  Procedures Procedures (including critical care time)  Medications Ordered in ED Medications  allopurinol (ZYLOPRIM) tablet 300 mg (300 mg Oral Given 09/10/16 0904)  silver sulfADIAZINE (SILVADENE) 1 % cream ( Topical Given 09/10/16 1728)  amLODipine (NORVASC) tablet 10 mg (10 mg  Oral Given 09/10/16 0904)  metoprolol succinate (TOPROL-XL) 24 hr tablet 50 mg (50 mg Oral Given 09/10/16 0904)  hydrALAZINE (APRESOLINE) tablet 25 mg (25 mg Oral Given 09/10/16 1500)  rosuvastatin (CRESTOR) tablet 20 mg (20 mg Oral Given 09/10/16 1727)  insulin aspart (novoLOG) injection 0-20 Units (7 Units Subcutaneous Given 09/10/16 1727)  insulin aspart (novoLOG) injection 0-5 Units (3  Units Subcutaneous Given 09/09/16 2221)  labetalol (NORMODYNE,TRANDATE) injection 10 mg (not administered)  hydrALAZINE (APRESOLINE) injection 5 mg (not administered)  acetaminophen (TYLENOL) tablet 650 mg (not administered)  ondansetron (ZOFRAN) injection 4 mg (not administered)  sodium chloride flush (NS) 0.9 % injection 3 mL (3 mLs Intravenous Given 09/10/16 1000)  sodium chloride flush (NS) 0.9 % injection 3 mL (not administered)  0.9 %  sodium chloride infusion (not administered)  aspirin chewable tablet 81 mg (81 mg Oral Given 09/10/16 0904)  clopidogrel (PLAVIX) tablet 75 mg (75 mg Oral Given 09/10/16 0846)  HYDROcodone-acetaminophen (NORCO/VICODIN) 5-325 MG per tablet 1-2 tablet (not administered)  0.9 %  sodium chloride infusion ( Intravenous Stopped 09/09/16 1047)  insulin glargine (LANTUS) injection 15 Units (15 Units Subcutaneous Given 09/09/16 2221)  nitroGLYCERIN (NITROGLYN) 2 % ointment 1 inch (1 inch Topical Given 08/29/16 2033)  furosemide (LASIX) injection 40 mg (40 mg Intravenous Given 08/29/16 2036)  potassium chloride SA (K-DUR,KLOR-CON) CR tablet 20 mEq (20 mEq Oral Given 08/30/16 2137)  metoprolol tartrate (LOPRESSOR) tablet 12.5 mg (12.5 mg Oral Given 08/31/16 2125)  living well with diabetes book MISC ( Does not apply Given 09/06/16 1702)  aspirin chewable tablet 81 mg (81 mg Oral Given 09/03/16 0500)  insulin glargine (LANTUS) injection 8 Units (8 Units Subcutaneous Given 09/02/16 2218)  pneumococcal 23 valent vaccine (PNU-IMMUNE) injection 0.5 mL (0.5 mLs Intramuscular Given 09/05/16 1338)  aspirin chewable tablet 81 mg (81 mg Oral Given 09/08/16 0524)  insulin glargine (LANTUS) injection 10 Units (10 Units Subcutaneous Given 09/07/16 2217)  furosemide (LASIX) injection 40 mg (40 mg Intravenous Given 09/08/16 1948)  insulin glargine (LANTUS) injection 10 Units (10 Units Subcutaneous Given 09/08/16 2214)  angioplasty book ( Does not apply Given 09/08/16 2045)  0.9 %  sodium chloride  infusion ( Intravenous New Bag/Given 09/09/16 1700)  furosemide (LASIX) injection 40 mg (40 mg Intravenous Given 09/10/16 1511)     Initial Impression / Assessment and Plan / ED Course  I have reviewed the triage vital signs and the nursing notes.  Pertinent labs & imaging results that were available during my care of the patient were reviewed by me and considered in my medical decision making (see chart for details).        Final Clinical Impressions(s) / ED Diagnoses   Final diagnoses:  Peripheral edema  Acute congestive heart failure, unspecified congestive heart failure type [facility])    New Prescriptions Current Discharge Medication List       Junius Creamer, NP 09/10/16 2046    Leo Grosser, MD 09/11/16 [phone]

## 2016-08-29 NOTE — H&P (Signed)
[facility] Admission History and Physical Service Pager: [phone]  Patient name: Kristina Howell Medical record number: [id number] Date of birth: [birth year] Age: 81 y.o. Gender: female  Primary Care Provider: Tawni Carnes, PA-C Consultants: None Code Status: Full (confirmed on admission)  Chief Complaint: Dyspnea  Assessment and Plan: Elayah Klooster is a 81 y.o. female presenting with worsening dyspnea. PMH is significant for T2DM on insulin, persistent afib, morbid obesity, HTN, gout, and pancreatitis.    Worsening Dyspnea: Patient grossly fluid overloaded on exam with pitting edema to buttocks and decreased breath sounds at bases. CXR with c/w mild CHF with bibasilar atelectatic changes and effusions. EKG with afib (chronic) and possible old anterolateral infarct. BNP 862.2. I-stat troponin 0.03. Symptoms improved by nitroglycerin and IV diuresis, making CHF exacerbation most likely diagnosis. Patient reports dry weight close to 270 but was 280 at doctor's visit last week. Possible inciting factors include persistent afib and uncontrolled HTN (but could also be 2/2 fluid overload).  - Admit inpatient to telemetry, attending Dr. McDiarmid - s/p 40 mg IV lasix and nitroglycerin patch in ED - Initial good UOP with 40 mg lasix IV; Continue - Strict I/Os - Daily weights - Obtain ECHO - Trend troponins - Check lipid panel  - Consider Heart Failure consult, pending ECHO - PT/OT  T2DM: Takes lantus 20 U QHS, took prior to admission.  - Check hgb A1c - CBGs qACHS - Lantus 10 U nightly (half home dose) - Sensitive SSI  HTN: BP elevated up to 202/133 prior to administration of IV lasix. On benazepril 40 mg daily (takes in morning) and toprol 50 mg daily (at night). - Continue IV lasix in setting of suspected CHF exacerbation. - Switch toprol to metoprolol tartrate 12.5 mg q6h until EF known - Restart benazepril as able based on renal function - May need IV  prns in the interim  AKI: SCr 1.64 on admission. Baseline may be ~ 1.0 (2015). Suspect prerenal in setting of CHF exacerbation as BUN:Cr 21.  - Hold home benazepril until SCr improves  R foot ulcer: Callused, does not appear infected. PCP had referred to wound care for further debridement but has not yet had appointment.  - Consult WOC while inpatient  Persistent afib: On eliquis 5 mg daily. Used to be on BID dosing but caused too much pinpoint bleeding per patient. Rate controlled on metoprolol.  - Switch to 2.5 mg BID, per pharmacy  Hx of gout: - Continue home allopurinol  FEN/GI: Heart healthy/carb modified Prophylaxis: On eliquis  Disposition: Pending improvement in volume status  History of Present Illness:  Kristina Howell is a 81 y.o. female presenting with worsening dyspnea. Symptoms began a couple of months ago but became acutely worse over last 1.5 weeks. When her children found out about her symptoms, they urged her to go to the hospital. She reports dyspnea with minimal exertion, even "changing positions," but says she has been able to do chores around the house. Standing to cook dinner, however, has been difficult lately. She sleeps upright in an easy chair. She has had sensation of chest tightness, "like [she] can't take a deep breath or get enough air," that has improved with taking her husband's nitroglycerin he has for CAD. She takes lasix 20 mg at home for "general fluid overload." She says lower extremity edema has been an issue for years but does seem worse lately and didn't use to involve R leg. Thinks dry weight is around 270 lbs.  Was scheduled  to see Dr. Domenic Polite of [facility] in January but had to reschedule to 09/07/16 due to snowstorm. Per Gastroenterology note in Care Everywhere from 03/2014, pt had normal ECHO in 2014 but actual ECHO not available. She denies cough, chest palpitations, fever, chills.  In ED, patient was given 40 mg IV lasix, and  nitroglycerin paste was applied. This improved sensation of dyspnea. She did not have O2 desaturations and never required supplemental O2.   Review Of Systems: Per HPI with the following additions:  Review of Systems  Constitutional: Negative for chills and fever.  HENT: Negative for congestion and sore throat.   Eyes: Negative for blurred vision and pain.  Respiratory: Positive for shortness of breath. Negative for cough.   Cardiovascular: Positive for chest pain (chest tightness), orthopnea and leg swelling.  Gastrointestinal: Negative for abdominal pain, constipation, diarrhea, nausea and vomiting.  Genitourinary: Negative for dysuria and urgency.  Musculoskeletal: Negative for falls and joint pain.  Skin: Negative for itching and rash.  Neurological: Negative for focal weakness and headaches.    There are no active problems to display for this patient.   Past Medical History: Past Medical History:  Diagnosis Date  . A-fib ([place])   . Diabetes mellitus without complication ([place])   . Hypertension     Past Surgical History: Past Surgical History:  Procedure Laterality Date  . CHOLECYSTECTOMY    . TONSILLECTOMY      Social History: Social History  Substance Use Topics  . Smoking status: Never Smoker  . Smokeless tobacco: Never Used  . Alcohol use No   Additional social history: Lives with husband. Both now retired.   Please also refer to relevant sections of EMR.  Family History: No family history on file. Parents both had heart disease, T2DM  Allergies and Medications: No Known Allergies No current facility-administered medications on file prior to encounter.    No current outpatient prescriptions on file prior to encounter.  Avoids glipizide and HCTZ due to her history of pancreatitis.   Objective: BP (!) 202/133 (BP Location: Right Arm)   Pulse 84   Temp 98.2 F (36.8 C) (Oral)   Resp 20   Ht 5\' 5"  (1.651 m)   Wt 280 lb (127 kg)   SpO2 99%   BMI 46.59  kg/m  Exam: General: Very pleasant female, in NAD, sitting up in bed Eyes: PERRLA, EOMI ENTM: MMM. Normal posterior oropharynx. Poor dentition.  Neck: Supple, FROM Cardiovascular: Irregularly, irregular. No m/r/g noted. Difficult to assess for JVD given habitus.  Respiratory: Decreased breath sounds over lung bases Gastrointestinal: Obese abdomen. +BS, soft, NT. Small umbilical hernia.  MSK: 3+ pitting edema of feet and ankles. 2+ pitting edema to shins. 1+ pitting edema to upper thigh and buttocks.  Derm: Hyperpigmentation venous status changes to lower leg. Callused region to R plantar eminence below great toe about 2cmx2.5cm.  Neuro: AOx3. No focal deficits. Psych: Normal mood and affect.      Labs and Imaging: CBC BMET   Recent Labs Lab 08/29/16 1918  WBC 10.1  HGB 13.8  HCT 42.6  PLT 245    Recent Labs Lab 08/29/16 1918  NA 134*  K 3.9  CL 103  CO2 21*  BUN 35*  CREATININE 1.64*  GLUCOSE 285*  CALCIUM 9.2     Dg Chest 2 View  Result Date: 08/29/2016 CLINICAL DATA:  Shortness of breath for 2 months EXAM: CHEST  2 VIEW COMPARISON:  None. FINDINGS: Cardiac shadow is  at the upper limits of normal in size. Mild vascular congestion is seen. Small bilateral pleural effusions and bibasilar atelectatic changes are seen. No bony abnormality is noted. IMPRESSION: Mild CHF with bibasilar atelectatic changes and effusions. Electronically Signed   By: Inez Catalina M.D.   On: 08/29/2016 19:56    Hillary Corinda Gubler, MD 08/29/2016, 9:54 PM PGY-2, [place] Intern pager: [phone], text pages welcome

## 2016-08-29 NOTE — ED Triage Notes (Signed)
Pt c/o shortness of breath for more than a week. Pt had EMS come out to house and her BP  210/88. Pt did not want to ride in the ambulance. Pt BP here is 196/97

## 2016-08-29 NOTE — Progress Notes (Signed)
Patient arrived on [place]. Patient alert and oriented. Patient advised of belongings policy. Patient verbalized understanding. CCMD notified. Call bell within reach. Patient in bed resting. Will continue to monitor patient.

## 2016-08-30 ENCOUNTER — Encounter (HOSPITAL_COMMUNITY): Payer: Self-pay | Admitting: Family Medicine

## 2016-08-30 ENCOUNTER — Inpatient Hospital Stay (HOSPITAL_COMMUNITY): Payer: PPO

## 2016-08-30 DIAGNOSIS — E11621 Type 2 diabetes mellitus with foot ulcer: Secondary | ICD-10-CM | POA: Diagnosis present

## 2016-08-30 DIAGNOSIS — I4819 Other persistent atrial fibrillation: Secondary | ICD-10-CM | POA: Diagnosis present

## 2016-08-30 DIAGNOSIS — I509 Heart failure, unspecified: Secondary | ICD-10-CM

## 2016-08-30 DIAGNOSIS — L97509 Non-pressure chronic ulcer of other part of unspecified foot with unspecified severity: Secondary | ICD-10-CM

## 2016-08-30 DIAGNOSIS — E119 Type 2 diabetes mellitus without complications: Secondary | ICD-10-CM

## 2016-08-30 DIAGNOSIS — R609 Edema, unspecified: Secondary | ICD-10-CM | POA: Diagnosis present

## 2016-08-30 DIAGNOSIS — R600 Localized edema: Secondary | ICD-10-CM | POA: Diagnosis present

## 2016-08-30 DIAGNOSIS — R0600 Dyspnea, unspecified: Secondary | ICD-10-CM | POA: Diagnosis present

## 2016-08-30 DIAGNOSIS — N179 Acute kidney failure, unspecified: Secondary | ICD-10-CM | POA: Diagnosis present

## 2016-08-30 LAB — BASIC METABOLIC PANEL
Anion gap: 9 (ref 5–15)
BUN: 32 mg/dL — AB (ref 6–20)
CHLORIDE: 106 mmol/L (ref 101–111)
CO2: 24 mmol/L (ref 22–32)
Calcium: 9.2 mg/dL (ref 8.9–10.3)
Creatinine, Ser: 1.58 mg/dL — ABNORMAL HIGH (ref 0.44–1.00)
GFR calc Af Amer: 35 mL/min — ABNORMAL LOW (ref >60)
GFR calc non Af Amer: 30 mL/min — ABNORMAL LOW (ref >60)
GLUCOSE: 246 mg/dL — AB (ref 65–99)
POTASSIUM: 3.4 mmol/L — AB (ref 3.5–5.1)
Sodium: 139 mmol/L (ref 135–145)

## 2016-08-30 LAB — LIPID PANEL
CHOLESTEROL: 242 mg/dL — AB (ref 0–200)
HDL: 36 mg/dL — AB (ref >40)
LDL Cholesterol: 180 mg/dL — ABNORMAL HIGH (ref 0–99)
TRIGLYCERIDES: 130 mg/dL (ref <150)
Total CHOL/HDL Ratio: 6.7 RATIO
VLDL: 26 mg/dL (ref 0–40)

## 2016-08-30 LAB — GLUCOSE, CAPILLARY
GLUCOSE-CAPILLARY: 240 mg/dL — AB (ref 65–99)
GLUCOSE-CAPILLARY: 302 mg/dL — AB (ref 65–99)
GLUCOSE-CAPILLARY: 314 mg/dL — AB (ref 65–99)
Glucose-Capillary: 206 mg/dL — ABNORMAL HIGH (ref 65–99)
Glucose-Capillary: 288 mg/dL — ABNORMAL HIGH (ref 65–99)

## 2016-08-30 LAB — TROPONIN I
TROPONIN I: 0.05 ng/mL — AB (ref <0.03)
TROPONIN I: 0.05 ng/mL — AB (ref <0.03)

## 2016-08-30 LAB — ECHOCARDIOGRAM COMPLETE
Height: 66 in
Weight: 4440 oz

## 2016-08-30 MED ORDER — METOPROLOL TARTRATE 12.5 MG HALF TABLET
12.5000 mg | ORAL_TABLET | Freq: Two times a day (BID) | ORAL | Status: AC
  Administered 2016-08-30 – 2016-08-31 (×3): 12.5 mg via ORAL
  Filled 2016-08-30 (×4): qty 1

## 2016-08-30 MED ORDER — AMLODIPINE BESYLATE 5 MG PO TABS
5.0000 mg | ORAL_TABLET | Freq: Every day | ORAL | Status: DC
  Administered 2016-08-30: 5 mg via ORAL
  Filled 2016-08-30: qty 1

## 2016-08-30 MED ORDER — POTASSIUM CHLORIDE CRYS ER 20 MEQ PO TBCR
20.0000 meq | EXTENDED_RELEASE_TABLET | Freq: Two times a day (BID) | ORAL | Status: AC
  Administered 2016-08-30 (×2): 20 meq via ORAL
  Filled 2016-08-30 (×2): qty 1

## 2016-08-30 MED ORDER — PERFLUTREN LIPID MICROSPHERE
1.0000 mL | INTRAVENOUS | Status: DC | PRN
  Administered 2016-08-30: 2 mL via INTRAVENOUS
  Filled 2016-08-30: qty 10

## 2016-08-30 MED ORDER — HYDRALAZINE HCL 20 MG/ML IJ SOLN
5.0000 mg | INTRAMUSCULAR | Status: DC | PRN
  Administered 2016-08-30 – 2016-08-31 (×3): 5 mg via INTRAVENOUS
  Filled 2016-08-30 (×3): qty 1

## 2016-08-30 MED ORDER — SILVER SULFADIAZINE 1 % EX CREA
TOPICAL_CREAM | Freq: Every day | CUTANEOUS | Status: DC
  Administered 2016-08-30 – 2016-09-12 (×12): via TOPICAL
  Administered 2016-09-13: 1 via TOPICAL
  Administered 2016-09-14 – 2016-09-16 (×3): via TOPICAL
  Filled 2016-08-30: qty 85

## 2016-08-30 NOTE — Consult Note (Signed)
[facility] Nurse wound consult note Reason for Consult: right foot wound Patient reports she had an open wound before and was followed by podiatry.  It had healed and recently opened back up, she is mostly non ambulatory.  She does not have an offloading boot/shoe.  I have educated her on the rationale for offloading this ulcer.  Her primary care MD has made a referral for the [place] [facility] care center recently.  I have contacted the wound care center and they report that they will need to discuss with primary care MD related to insurance, they will contact the patient early next week for a new patient appointment. Wound type: neuropathic foot ulcer Measurement: 2.5cm x 2.5cm x 0.5cm  Wound bed: 98% yellow, but hardened like callous, 2% centrally is pink Drainage (amount, consistency, odor) none at the time of my assessment, no dressing in place Periwound:hyperkeratosis Dressing procedure/placement/frequency: silvadene for antimicrobial effect, cover with non adherent dressing.  Follow up with wound care center as soon as possible    Discussed POC with patient and bedside nurse.  Re consult if needed, will not follow at this time. Thanks  Kylii Ennis [employer], RN,CWOCN, CNS [phone])

## 2016-08-30 NOTE — Progress Notes (Signed)
  Echocardiogram 2D Echocardiogram with Definity has been performed.  Diamond Nickel 08/30/2016, 1:31 PM

## 2016-08-30 NOTE — Discharge Instructions (Addendum)
Your first follow up appointment will be with PA in [place] then he will have you follow up after that with Dr. Domenic Polite in [place].  Call if problems with your leg.  Always take the Eliquis twice a day.    No strenous activity for at least a week.    Information on my medicine - ELIQUIS (apixaban)  This medication education was reviewed with me or my healthcare representative as part of my discharge preparation.  The pharmacist that spoke with me during my hospital stay was:  Wayland Salinas, [facility]  Why was Eliquis prescribed for you? Eliquis was prescribed for you to reduce the risk of a blood clot forming that can cause a stroke if you have a medical condition called atrial fibrillation (a type of irregular heartbeat).  What do You need to know about Eliquis ? Take your Eliquis TWICE DAILY - one tablet in the morning and one tablet in the evening with or without food. If you have difficulty swallowing the tablet whole please discuss with your pharmacist how to take the medication safely.  Take Eliquis exactly as prescribed by your doctor and DO NOT stop taking Eliquis without talking to the doctor who prescribed the medication.  Stopping may increase your risk of developing a stroke.  Refill your prescription before you run out.  After discharge, you should have regular check-up appointments with your healthcare provider that is prescribing your Eliquis.  In the future your dose may need to be changed if your kidney function or weight changes by a significant amount or as you get older.  What do you do if you miss a dose? If you miss a dose, take it as soon as you remember on the same day and resume taking twice daily.  Do not take more than one dose of ELIQUIS at the same time to make up a missed dose.  Important Safety Information A possible side effect of Eliquis is bleeding. You should call your healthcare provider right away if you experience any of the  following: ? Bleeding from an injury or your nose that does not stop. ? Unusual colored urine (red or dark brown) or unusual colored stools (red or black). ? Unusual bruising for unknown reasons. ? A serious fall or if you hit your head (even if there is no bleeding).  Some medicines may interact with Eliquis and might increase your risk of bleeding or clotting while on Eliquis. To help avoid this, consult your healthcare provider or pharmacist prior to using any new prescription or non-prescription medications, including herbals, vitamins, non-steroidal anti-inflammatory drugs (NSAIDs) and supplements.  This website has more information on Eliquis (apixaban): http://www.eliquis.com/eliquis/home

## 2016-08-30 NOTE — Progress Notes (Signed)
Nurse received report of patients elevated Bp. Nurse rechecked BP manually. Patients BP 180/100. MD paged.

## 2016-08-30 NOTE — Progress Notes (Signed)
CRITICAL VALUE ALERT  Critical value received:  Troponin 0.05  Date of notification:  08/30/2016  Time of notification:  0305  Critical value read back: Yes  Nurse who received alert:  Hermina Barters  MD notified (1st page):  Tye Savoy  Time of first page:  0306  MD notified (2nd page):  Time of second page:  Responding MD:  Sharman Cheek  Time MD responded:  [phone]

## 2016-08-30 NOTE — Evaluation (Signed)
Physical Therapy Evaluation Patient Details Name: Kristina Howell MRN: [id number] DOB: [birth year] Today's Date: 08/30/2016   History of Present Illness  81 y.o. female presenting with worsening dyspnea and admitted with CHF exacerbation. PMH is significant for T2DM on insulin, persistent afib, morbid obesity, HTN, gout, and pancreatitis.    Clinical Impression  Pt admitted with above diagnosis. Pt currently with functional limitations due to the deficits listed below (see PT Problem List). On eval, pt required min guard assist transfer and gait 15 feet with RW. Gait distance limited by fatigue and SOB. Pt will benefit from skilled PT to increase their independence and safety with mobility to allow discharge to the venue listed below.       Follow Up Recommendations Home health PT;Supervision for mobility/OOB    Equipment Recommendations  None recommended by PT    Recommendations for Other Services       Precautions / Restrictions Precautions Precautions: None      Mobility  Bed Mobility Overal bed mobility: Modified Independent                Transfers Overall transfer level: Needs assistance Equipment used: Ambulation equipment used Transfers: Sit to/from Stand;Stand Pivot Transfers Sit to Stand: Min guard Stand pivot transfers: Min guard       General transfer comment: min guard for safety only  Ambulation/Gait Ambulation/Gait assistance: Min guard Ambulation Distance (Feet): 15 Feet Assistive device: Rolling walker (2 wheeled) Gait Pattern/deviations: Step-through pattern;Decreased stride length Gait velocity: decreased Gait velocity interpretation: Below normal speed for age/gender General Gait Details: gait distance limited by fatigue and SOB  Stairs            Wheelchair Mobility    Modified Rankin (Stroke Patients Only)       Balance Overall balance assessment: Needs assistance Sitting-balance support: No upper extremity supported;Feet  supported Sitting balance-Leahy Scale: Good     Standing balance support: Bilateral upper extremity supported;During functional activity Standing balance-Leahy Scale: Fair Standing balance comment: RW needed for ambulation                             Pertinent Vitals/Pain Pain Assessment: No/denies pain    Home Living Family/patient expects to be discharged to:: Private residence Living Arrangements: Spouse/significant other Available Help at Discharge: Family;Available 24 hours/day Type of Home: House Home Access: Level entry     Home Layout: One level (one step into den) Home Equipment: Gilford Rile - 2 wheels;Cane - single point;Bedside commode;Shower seat      Prior Function Level of Independence: Independent with assistive device(s)         Comments: Cane or RW used for ambulation. Independent with all ADLs.     Hand Dominance        Extremity/Trunk Assessment   Upper Extremity Assessment Upper Extremity Assessment: Defer to OT evaluation    Lower Extremity Assessment Lower Extremity Assessment: Generalized weakness    Cervical / Trunk Assessment Cervical / Trunk Assessment: Normal  Communication   Communication: No difficulties  Cognition Arousal/Alertness: Awake/alert Behavior During Therapy: WFL for tasks assessed/performed Overall Cognitive Status: Within Functional Limits for tasks assessed                      General Comments      Exercises     Assessment/Plan    PT Assessment Patient needs continued PT services  PT Problem List Decreased strength;Decreased activity tolerance;Decreased mobility;Cardiopulmonary status limiting  activity;Decreased balance          PT Treatment Interventions Gait training;Stair training;Functional mobility training;Balance training;Therapeutic exercise;Therapeutic activities;Patient/family education    PT Goals (Current goals can be found in the Care Plan section)  Acute Rehab PT  Goals Patient Stated Goal: independence PT Goal Formulation: With patient Time For Goal Achievement: 08/30/16 Potential to Achieve Goals: Good    Frequency Min 3X/week   Barriers to discharge        Co-evaluation               End of Session Equipment Utilized During Treatment: Gait belt Activity Tolerance: Patient tolerated treatment well Patient left: in chair;with call bell/phone within reach;with nursing/sitter in room Nurse Communication: Mobility status         Time: 8867-7373 PT Time Calculation (min) (ACUTE ONLY): 16 min   Charges:   PT Evaluation $PT Eval Moderate Complexity: 1 Procedure     PT G Codes:        Lorriane Shire 08/30/2016, 11:53 AM

## 2016-08-30 NOTE — Progress Notes (Signed)
Family Medicine Teaching Service Daily Progress Note Intern Pager: [phone]  Patient name: Kristina Howell Medical record number: [id number] Date of birth: June 17, 1936 Age: 81 y.o. Gender: female  Primary Care Provider: Tawni Carnes, PA-C Consultants: None Code Status: Full (confirmed this admission)  Pt Overview and Major Events to Date:  08/29/2016: Admit to FPTS  Assessment and Plan: Kristina Howell is a 81 y.o. female presenting with worsening dyspnea. PMH is significant for T2DM on insulin, persistent afib, morbid obesity, HTN, gout, and pancreatitis.    Worsening Dyspnea: Fluid overloaded. CXR with c/w mild CHF with bibasilar atelectatic changes and effusions. EKG with afib (chronic) and possible old anterolateral infarct. BNP 862.2.  Pt reports dry weight ~270lbs. Possible inciting factors include persistent afib and uncontrolled HTN (but could also be 2/2 fluid overload).  - s/p 40 mg IV lasix and nitroglycerin patch in ED - Initial good UOP with 40 mg lasix IV; Continue - Strict I/Os: net -1.5 since admit - Daily wts 280 lbs >>277 lb 8 oz - Echo pending - Trend troponin - 0.03 > 0.05>0.05 likely demand ischemia - Consider Heart Failure consult, pending ECHO - PT/OT  T2DM: Takes lantus 20 U QHS, took prior to admission.  - Check hgb A1c - CBGs qACHS - Lantus 10 U nightly (half home dose) - Sensitive SSI  Diarrhea - non-bloody, two episodes this morning - monitor, no recent abx use, likely not c diff - may be caused by potassium given this admission  HTN: BP elevated up to 202/133 prior to administration of IV lasix. On benazepril 40 mg daily (takes in morning) and toprol 50 mg daily (at night). Elevated this AM at 182/106. Patient received PRN hydralazine prior to new 5 mg amlodipine. BP 144/70. Will continue to monitor. - Continue IV lasix in setting of suspected CHF exacerbation. - Switch toprol to metoprolol tartrate 12.5 mg BID - Restart benazepril as able based on  renal function - PRN Hydral added this AM for SBP > 180, DBP > 110 - Added amlodipine 5 mg this AM while holding ACE for AKI  AKI: SCr 1.64 > 1.58  Baseline may be ~ 1.0 (2015). Suspect prerenal in setting of CHF exacerbation as BUN:Cr 21.  - Hold home benazepril until SCr improves  R foot ulcer: Callused, does not appear infected. PCP had referred to wound care for further debridement but has not yet had appointment.  - dead skin edges of the callus removed, ulcer present below, does not seem to palpate to bone - Consult WOC while inpatient  Persistent afib: On eliquis 5 mg daily. Used to be on BID dosing but caused too much pinpoint bleeding per patient. Rate controlled on metoprolol.  - Switch to 2.5 mg Eliquis BID, per pharmacy - rate control metop 12.5 mg BID  Hx of gout: - Continue home allopurinol  FEN/GI: Heart healthy/carb modified Prophylaxis: On eliquis  Disposition: will follow PT recs, maybe home  Subjective:  Patient endorses 2 episodes of nonbloody diarrhea this morning, with some burning in the anal region. No abdominal pain, no N/V.   Objective: Temp:  [97.5 F (36.4 C)-98.5 F (36.9 C)] 97.8 F (36.6 C) (02/12 1135) Pulse Rate:  [71-92] 88 (02/12 1135) Resp:  [18-33] 18 (02/12 0602) BP: (144-202)/(70-133) 165/110 (02/12 1135) SpO2:  [96 %-100 %] 100 % (02/12 1135) Weight:  [277 lb 8 oz (125.9 kg)-281 lb 9.6 oz (127.7 kg)] 277 lb 8 oz (125.9 kg) (02/12 0602) Physical Exam: General: NAD, rests comfortably Cardiovascular: RRR,  no m/r/g Respiratory: CTA bil, no W/R/R Abdomen: obese, soft, nontender, nondistended Extremities: 2+ pitting edema to shins Derm: chronic hyperpigmentation venous changes over lower leg. Callused region to R plantar eminence below great toe about 2cmx2.5cm.   Laboratory:  Recent Labs Lab 08/29/16 1918  WBC 10.1  HGB 13.8  HCT 42.6  PLT 245    Recent Labs Lab 08/29/16 1918 08/30/16 0206  NA 134* 139  K 3.9 3.4*   CL 103 106  CO2 21* 24  BUN 35* 32*  CREATININE 1.64* 1.58*  CALCIUM 9.2 9.2  GLUCOSE 285* 246*   Pertinent labs this admission Lipid panel Chol 242 Tri 130 HDL 36 LDL 180 Troponin 0.03 >0.05>0.05  Imaging/Diagnostic Tests: Dg Chest 2 View: 08/29/2016 FINDINGS: Cardiac shadow is at the upper limits of normal in size. Mild vascular congestion is seen. Small bilateral pleural effusions and bibasilar atelectatic changes are seen. No bony abnormality is noted. IMPRESSION: Mild CHF with bibasilar atelectatic changes and effusions.   Everrett Coombe, MD 08/30/2016, 4:32 PM PGY-1, [place] Intern pager: [phone], text pages welcome

## 2016-08-30 NOTE — Progress Notes (Addendum)
Inpatient Diabetes Program Recommendations  AACE/ADA: New Consensus Statement on Inpatient Glycemic Control (2015)  Target Ranges:  Prepandial:   less than 140 mg/dL      Peak postprandial:   less than 180 mg/dL (1-2 hours)      Critically ill patients:  140 - 180 mg/dL   Lab Results  Component Value Date   GLUCAP 206 (H) 08/30/2016    Review of Glycemic Control  Diabetes history:     DM2, Morbid Obesity, EGFR=30 Outpatient Diabetes medications:     Lantus 20 units daily Current orders for Inpatient glycemic control:     Sensitive correction scale Novolog 0-9 units TIDAC    Lantus 10 units QHS  Inpatient Diabetes Program Recommendations:     Noted patient had blood glucose of 285 mg/dL and had taken Lantus 20 units prior to being admitted.     Please consider increasing Lantus to 15 units daily and adding Meal Coverage of Novolog 3 units TIDAC if patient eats > 50% of meal.     Will follow A1C results.  Thank you,  Windy Carina, RN, MSN Diabetes Coordinator Inpatient Diabetes Program [phone] (Team Pager)

## 2016-08-31 ENCOUNTER — Encounter (HOSPITAL_COMMUNITY): Payer: Self-pay | Admitting: Cardiology

## 2016-08-31 DIAGNOSIS — N179 Acute kidney failure, unspecified: Secondary | ICD-10-CM

## 2016-08-31 DIAGNOSIS — I5021 Acute systolic (congestive) heart failure: Secondary | ICD-10-CM

## 2016-08-31 DIAGNOSIS — E11621 Type 2 diabetes mellitus with foot ulcer: Secondary | ICD-10-CM

## 2016-08-31 DIAGNOSIS — L97509 Non-pressure chronic ulcer of other part of unspecified foot with unspecified severity: Secondary | ICD-10-CM

## 2016-08-31 DIAGNOSIS — E1169 Type 2 diabetes mellitus with other specified complication: Secondary | ICD-10-CM

## 2016-08-31 DIAGNOSIS — I200 Unstable angina: Secondary | ICD-10-CM

## 2016-08-31 DIAGNOSIS — Z794 Long term (current) use of insulin: Secondary | ICD-10-CM

## 2016-08-31 DIAGNOSIS — I481 Persistent atrial fibrillation: Secondary | ICD-10-CM

## 2016-08-31 LAB — GLUCOSE, CAPILLARY
GLUCOSE-CAPILLARY: 230 mg/dL — AB (ref 65–99)
Glucose-Capillary: 213 mg/dL — ABNORMAL HIGH (ref 65–99)
Glucose-Capillary: 254 mg/dL — ABNORMAL HIGH (ref 65–99)
Glucose-Capillary: 388 mg/dL — ABNORMAL HIGH (ref 65–99)

## 2016-08-31 LAB — CBC
HEMATOCRIT: 40.5 % (ref 36.0–46.0)
HEMOGLOBIN: 13.1 g/dL (ref 12.0–15.0)
MCH: 28.4 pg (ref 26.0–34.0)
MCHC: 32.3 g/dL (ref 30.0–36.0)
MCV: 87.9 fL (ref 78.0–100.0)
Platelets: 250 10*3/uL (ref 150–400)
RBC: 4.61 MIL/uL (ref 3.87–5.11)
RDW: 14.9 % (ref 11.5–15.5)
WBC: 8.2 10*3/uL (ref 4.0–10.5)

## 2016-08-31 LAB — BASIC METABOLIC PANEL
ANION GAP: 11 (ref 5–15)
BUN: 32 mg/dL — ABNORMAL HIGH (ref 6–20)
CALCIUM: 9.1 mg/dL (ref 8.9–10.3)
CO2: 25 mmol/L (ref 22–32)
Chloride: 103 mmol/L (ref 101–111)
Creatinine, Ser: 1.72 mg/dL — ABNORMAL HIGH (ref 0.44–1.00)
GFR, EST AFRICAN AMERICAN: 32 mL/min — AB (ref >60)
GFR, EST NON AFRICAN AMERICAN: 27 mL/min — AB (ref >60)
GLUCOSE: 216 mg/dL — AB (ref 65–99)
POTASSIUM: 3.6 mmol/L (ref 3.5–5.1)
Sodium: 139 mmol/L (ref 135–145)

## 2016-08-31 LAB — HEMOGLOBIN A1C
Hgb A1c MFr Bld: 10.3 % — ABNORMAL HIGH (ref 4.8–5.6)
MEAN PLASMA GLUCOSE: 249 mg/dL

## 2016-08-31 MED ORDER — AMLODIPINE BESYLATE 10 MG PO TABS
10.0000 mg | ORAL_TABLET | Freq: Every day | ORAL | Status: DC
  Administered 2016-08-31 – 2016-09-13 (×13): 10 mg via ORAL
  Filled 2016-08-31 (×13): qty 1

## 2016-08-31 MED ORDER — METOPROLOL SUCCINATE ER 50 MG PO TB24
50.0000 mg | ORAL_TABLET | Freq: Every day | ORAL | Status: DC
  Administered 2016-09-01 – 2016-09-16 (×15): 50 mg via ORAL
  Filled 2016-08-31 (×15): qty 1

## 2016-08-31 MED ORDER — LIVING WELL WITH DIABETES BOOK
Freq: Once | Status: AC
  Administered 2016-09-06: 17:00:00
  Filled 2016-08-31: qty 1

## 2016-08-31 MED ORDER — HYDRALAZINE HCL 20 MG/ML IJ SOLN
5.0000 mg | INTRAMUSCULAR | Status: DC | PRN
  Filled 2016-08-31: qty 1

## 2016-08-31 MED ORDER — HEPARIN (PORCINE) IN NACL 100-0.45 UNIT/ML-% IJ SOLN
1150.0000 [IU]/h | INTRAMUSCULAR | Status: DC
  Administered 2016-08-31: 1350 [IU]/h via INTRAVENOUS
  Administered 2016-09-01: 1550 [IU]/h via INTRAVENOUS
  Administered 2016-09-02: 1350 [IU]/h via INTRAVENOUS
  Administered 2016-09-02: 1600 [IU]/h via INTRAVENOUS
  Filled 2016-08-31 (×4): qty 250

## 2016-08-31 MED ORDER — ISOSORBIDE MONONITRATE ER 30 MG PO TB24
30.0000 mg | ORAL_TABLET | Freq: Every day | ORAL | Status: DC
  Administered 2016-08-31 – 2016-09-09 (×9): 30 mg via ORAL
  Filled 2016-08-31 (×9): qty 1

## 2016-08-31 MED ORDER — FUROSEMIDE 10 MG/ML IJ SOLN
40.0000 mg | Freq: Every day | INTRAMUSCULAR | Status: DC
  Administered 2016-08-31 – 2016-09-01 (×2): 40 mg via INTRAVENOUS
  Filled 2016-08-31 (×2): qty 4

## 2016-08-31 MED ORDER — HYDRALAZINE HCL 10 MG PO TABS
10.0000 mg | ORAL_TABLET | Freq: Three times a day (TID) | ORAL | Status: DC
  Administered 2016-08-31 – 2016-09-02 (×6): 10 mg via ORAL
  Filled 2016-08-31 (×6): qty 1

## 2016-08-31 MED ORDER — FUROSEMIDE 40 MG PO TABS: 40.0000 mg | ORAL_TABLET | Freq: Every day | ORAL | Status: DC

## 2016-08-31 MED ORDER — INSULIN GLARGINE 100 UNIT/ML ~~LOC~~ SOLN
11.0000 [IU] | Freq: Every day | SUBCUTANEOUS | Status: DC
  Administered 2016-08-31: 11 [IU] via SUBCUTANEOUS
  Filled 2016-08-31: qty 0.11

## 2016-08-31 NOTE — Progress Notes (Signed)
Physical Therapy Treatment Patient Details Name: Kristina Howell MRN: [id number] DOB: 1936/01/05 Today's Date: 08/31/2016    History of Present Illness 81 y.o. female presenting with worsening dyspnea and admitted with CHF exacerbation. PMH is significant for T2DM on insulin, persistent afib, morbid obesity, HTN, gout, and pancreatitis.      PT Comments    Increased DOE noted today as compared to yesterday's session. Pt very motivated to participate with therapy and increase mobility. PT to continue per POC.  Follow Up Recommendations  Home health PT;Supervision for mobility/OOB     Equipment Recommendations  None recommended by PT    Recommendations for Other Services       Precautions / Restrictions Precautions Precautions: None    Mobility  Bed Mobility Overal bed mobility: Modified Independent                Transfers   Equipment used: Ambulation equipment used   Sit to Stand: Min guard Stand pivot transfers: Min guard       General transfer comment: Pt demo good transfer technique for sit to stand and SPT to [facility].  Ambulation/Gait Ambulation/Gait assistance: Min guard Ambulation Distance (Feet): 25 Feet Assistive device: Rolling walker (2 wheeled) Gait Pattern/deviations: Step-through pattern;Decreased stride length Gait velocity: decreased Gait velocity interpretation: Below normal speed for age/gender General Gait Details: DOE with ambulation short distances. Pt ambulated on RA with sats at 95%.   Stairs            Wheelchair Mobility    Modified Rankin (Stroke Patients Only)       Balance   Sitting-balance support: Feet supported;No upper extremity supported Sitting balance-Leahy Scale: Good     Standing balance support: Bilateral upper extremity supported;During functional activity Standing balance-Leahy Scale: Fair Standing balance comment: RW needed for ambulation                    Cognition Arousal/Alertness:  Awake/alert Behavior During Therapy: WFL for tasks assessed/performed Overall Cognitive Status: Within Functional Limits for tasks assessed                      Exercises      General Comments        Pertinent Vitals/Pain Pain Assessment: No/denies pain    Home Living                      Prior Function            PT Goals (current goals can now be found in the care plan section) Acute Rehab PT Goals Patient Stated Goal: independence PT Goal Formulation: With patient Time For Goal Achievement: 09/13/16 Potential to Achieve Goals: Good    Frequency    Min 3X/week      PT Plan      Co-evaluation             End of Session Equipment Utilized During Treatment: Gait belt Activity Tolerance: Patient tolerated treatment well Patient left: in chair;with call bell/phone within reach     Time: 0850-0914 PT Time Calculation (min) (ACUTE ONLY): 24 min  Charges:  $Gait Training: 23-37 mins                    G Codes:      Lorriane Shire 08/31/2016, 9:55 AM

## 2016-08-31 NOTE — Progress Notes (Signed)
ANTICOAGULATION CONSULT NOTE - Initial Consult  Pharmacy Consult for Eliquis to Heparin Indication: chest pain/ACS  No Known Allergies  Patient Measurements: Height: 5\' 6"  (167.6 cm) Weight: 275 lb 8 oz (125 kg) (scale b) IBW/kg (Calculated) : 59.3 Heparin Dosing Weight: 91  Vital Signs: Temp: 98.7 F (37.1 C) (02/13 1333) Temp Source: Oral (02/13 1333) BP: 156/82 (02/13 1333) Pulse Rate: 82 (02/13 1333)  Labs:  Recent Labs  08/29/16 1918 08/30/16 0206 08/30/16 0806 08/31/16 0537  HGB 13.8  --   --  13.1  HCT 42.6  --   --  40.5  PLT 245  --   --  250  CREATININE 1.64* 1.58*  --  1.72*  TROPONINI  --  0.05* 0.05*  --     Estimated Creatinine Clearance: 36.4 mL/min (by C-G formula based on SCr of 1.72 mg/dL (H)).   Medical History: Past Medical History:  Diagnosis Date  . A-fib ([place])   . Diabetes mellitus without complication ([place])   . Gout   . Hypertension   . Pancreatitis, recurrent ([place])     Assessment: Transitioning from Eliquis to heparin for chest pain Last dose of Eliquis this AM at 10 am Will use PTT and heparin level until values coorelate  Goal of Therapy:  aPTT 66-102 seconds seconds Monitor platelets by anticoagulation protocol: Yes  Heparin level = 0.3-0.7   Plan:  To start heparin at 2200 pm at 1350 units / hr Daily heparin level, PTT, CBC  Thank you Anette Guarneri, PharmD [phone]  08/31/2016,4:01 PM

## 2016-08-31 NOTE — Progress Notes (Signed)
Newly diagnosed CHF pt continues with mild shortness of breath. Breath sounds clear and diminished. Pt with hx of A fib, but it is rate controlled. Spoke with MD. No new orders at this time. Will continue to monitor.

## 2016-08-31 NOTE — Progress Notes (Signed)
  Spoke briefly with patient regarding A1C and diabetes management.  Gave her handout on "Know your Numbers" and A1C goals.  She states that she was taken off Metformin due to renal function and is now taking Lantus 20 units daily.  She admits that her fasting blood sugars are the highest.  Discussed the liver and gluconeogenesis, and that this can effect morning blood sugars.  Discussed goal blood sugars in the morning and post-prandial.  She has PA that she see's in [place].  May need titration up of Lantus.   Please increase Lantus to 20 units and titrate according to fasting blood sugars.  Thanks, Adah Perl, RN, BC-ADM Inpatient Diabetes Coordinator Pager [phone] (8a-5p)

## 2016-08-31 NOTE — Progress Notes (Signed)
Family Medicine Teaching Service Daily Progress Note Intern Pager: [phone]  Patient name: Kristina Howell Medical record number: [id number] Date of birth: [birth year] Age: 81 y.o. Gender: female  Primary Care Provider: Tawni Carnes, PA-C Consultants: None Code Status: Full (confirmed this admission)  Pt Overview and Major Events to Date:  08/29/2016: Admit to FPTS  Assessment and Plan: Kristina Howell is a 81 y.o. female presenting with worsening dyspnea. PMH is significant for T2DM on insulin, persistent afib, morbid obesity, HTN, gout, and pancreatitis.    Worsening Dyspnea, new CHF diagnosis: Fluid overloaded. CXR with c/w mild CHF with bibasilar atelectatic changes and effusions. EKG with afib (chronic) and possible old anterolateral infarct. BNP 862.2.  Pt reports dry weight ~270lbs.  - Echo 08/30/16 - LVEF 45%, mild LV hypertrophy, +severe hypokinesis of mid anteroseptal wall, apical septal wall, true apex, mid to apical ant wall. Indeterminant diastolic function (afib) - Consider transitioning from 40 IV lasix Qd to 40 PO Lasix Qd as a maintenance dose - will call HF team - Initial good UOP with 40 mg lasix IV; Continue - Strict I/Os: net -3.8L since admit - Daily wts 280 lbs >>275 lb 8 oz - Troponin plateued - 0.03 > 0.05>0.05 > 0.05  likely demand ischemia - PT/OT - HH PT (ordered F2F)  T2DM: Takes lantus 20 U QHS, took prior to admission.  - Check hgb A1c - CBGs qACHS - Lantus 10 U nightly (half home dose) >> inc to 11u nightly  - Sensitive SSI  Diarrhea - non-bloody, two episodes yesterday - monitor, no recent abx use, likely not c diff - may be caused by potassium given this admission  HTN: BP elevated up to 202/133 on admission, has stayed hypertensive this admission. Added 5 mg Amlodipine yesterday because benazepril held for AKI. Added PRN hydralazine for high pressures.  - BP high overnight, 144/77 - 195/78 - hold benazepril, plan to restart as able based on renal  function - inc to norvasc 10 mg this AM while holding ACE for AKI  AKI: SCr 1.64 > 1.72  Baseline may be ~ 1.0 (2015). Suspect prerenal in setting of CHF exacerbation as BUN:Cr 21.  - Hold home benazepril until SCr improves  R foot ulcer: Callused, does not appear infected. PCP had referred to wound care for further debridement but has not yet had appointment.  - dead skin edges of the callus removed, ulcer present below, does not seem to palpate to bone - Consult WOC while inpatient  Persistent afib: On eliquis 5 mg daily. Used to be on BID dosing but caused too much pinpoint bleeding per patient. Rate controlled on metoprolol.  - Switch to 2.5 mg Eliquis BID, per pharmacy - rate control metop 12.5 mg BID  Hx of gout: - Continue home allopurinol  FEN/GI: Heart healthy/carb modified Prophylaxis: On eliquis  Disposition: will follow PT recs, maybe home  Subjective:  Patient is eating breakfast this morning. Diarrhea has resolved. Explained the result of her echo. She would like [place] to come by later and explained this to her family when they are available.  Objective: Temp:  [97.8 F (36.6 C)-98.4 F (36.9 C)] 98.2 F (36.8 C) (02/13 0455) Pulse Rate:  [80-98] 91 (02/13 0652) Resp:  [18-22] 20 (02/13 0455) BP: (144-195)/(63-110) 167/73 (02/13 0652) SpO2:  [96 %-100 %] 98 % (02/13 0455) Weight:  [275 lb 8 oz (125 kg)] 275 lb 8 oz (125 kg) (02/13 0455) Physical Exam: General: NAD, rests comfortably Cardiovascular: RRR, no m/r/g Respiratory:  CTA bil, diminished sounds at the lung bases, no W/R/R Abdomen: obese, soft, nontender, nondistended Extremities: 2+ pitting edema to shins Derm: chronic hyperpigmentation venous changes over lower leg. Callused region to R plantar eminence below great toe about 2cmx2.5cm.   Laboratory:  Recent Labs Lab 08/29/16 1918 08/31/16 0537  WBC 10.1 8.2  HGB 13.8 13.1  HCT 42.6 40.5  PLT 245 250    Recent Labs Lab 08/29/16 1918  08/30/16 0206 08/31/16 0537  NA 134* 139 139  K 3.9 3.4* 3.6  CL 103 106 103  CO2 21* 24 25  BUN 35* 32* 32*  CREATININE 1.64* 1.58* 1.72*  CALCIUM 9.2 9.2 9.1  GLUCOSE 285* 246* 216*   Pertinent labs this admission Lipid panel Chol 242 Tri 130 HDL 36 LDL 180 Troponin 0.03 >0.05>0.05  Imaging/Diagnostic Tests: Dg Chest 2 View: 08/29/2016 FINDINGS: Cardiac shadow is at the upper limits of normal in size. Mild vascular congestion is seen. Small bilateral pleural effusions and bibasilar atelectatic changes are seen. No bony abnormality is noted. IMPRESSION: Mild CHF with bibasilar atelectatic changes and effusions.   Everrett Coombe, MD 08/31/2016, 7:00 AM PGY-1, [facility] Intern pager: [phone], text pages welcome

## 2016-08-31 NOTE — Consult Note (Signed)
Cardiology Consult    Patient ID: Kristina Howell MRN: [id number], DOB/AGE: 09-04-1935   Admit date: 08/29/2016 Date of Consult: 08/31/2016  Primary Physician: Jeri Modena Reason for Consult: CHF/ chest pain Primary Cardiologist: New  Requesting Provider: Dr. McDiarmid   History of Present Illness    Kristina Howell is a 81 y.o. female with past medical history significant for Type 2 DM on insulin, persistent afib since 2015, morbid obesity, HTN, gout, and pancreatitis who presented to [facility] ED on the evening of 08/29/2016 for worsening shortness of breath and chest pain.   She has been having progressive DOE and chest burning/tightness over the past 2 months. Her symptoms occur with activity like walking to the back of her house and resolve with rest. About a week and a half ago she had a more severe episode of chest burning with shortness of breath for which she took a spray of her husband's NTG and the symptoms resolved. She has had chronic LE edema for about the last year. She has orthopnea and sleeps in a recliner. She had an episode of lightheadedness with elevated BP a week ago.   She is being diuresed and reports that breathing is much improved. Her lower leg edema has just started to decrease as of this morning. Her last episode of chest burning was last night and was brief.  She was last seen for atrial fib by Dr. Hamilton Capri at [facility] cardiology in [place] on 04/04/2014 at which time she was stable on beta blocker. She was only seen once there for per-procedure clearance. She had a CHADs score of 3 and anticoagulation was started. She has since been monitored by her PCP. She says that her kidney function was reported to be worse recently and they were planning to do a stress test to evaluate her symptoms.   EKG shows atrial fibrillation 86 bpm with T wave inversion anterolaterally, possible old anterospeptal infarct Troponins have been mildly elevated in a flat  pattern, 0.03, 0.05, 0.05 BNP 862.2 Creatinine is elevated 1.64-->1.58-->1.72  Echocardiogram on 08/30/16 showed reduced LV function with EF 45%, mild LVH, severe hypokinesis of the mid anteroseptal wall, apical septal wall, true apex, mid to apical anterior wall, indeterminant diastolic function.  Past Medical History   Past Medical History:  Diagnosis Date  . A-fib ([place])   . Diabetes mellitus without complication ([place])   . Gout   . Hypertension   . Pancreatitis, recurrent ([place])     Past Surgical History:  Procedure Laterality Date  . CHOLECYSTECTOMY    . TONSILLECTOMY       Allergies  No Known Allergies  Inpatient Medications    . allopurinol  300 mg Oral Daily  . amLODipine  10 mg Oral Daily  . furosemide  40 mg Intravenous Daily  . hydrALAZINE  10 mg Oral Q8H  . insulin aspart  0-9 Units Subcutaneous TID WC  . insulin glargine  11 Units Subcutaneous QHS  . isosorbide mononitrate  30 mg Oral Daily  . [START ON 09/01/2016] metoprolol succinate  50 mg Oral Daily  . metoprolol tartrate  12.5 mg Oral BID  . silver sulfADIAZINE   Topical Daily  . sodium chloride flush  3 mL Intravenous Q12H    Family History    Family History  Problem Relation Age of Onset  . Heart disease Father     Social History    Social History   Social History  . Marital status: Married  Spouse name: N/A  . Number of children: N/A  . Years of education: N/A   Occupational History  . Not on file.   Social History Main Topics  . Smoking status: Never Smoker  . Smokeless tobacco: Never Used  . Alcohol use No  . Drug use: No  . Sexual activity: Not on file   Other Topics Concern  . Not on file   Social History Narrative  . No narrative on file     Review of Systems   General:  No chills, fever, night sweats or weight changes.  Cardiovascular:  Positive for exertional chest pain and dyspnea on exertion, lower leg edema orthopnea, No palpitations, paroxysmal nocturnal  dyspnea. Dermatological: No rash, lesions/masses Respiratory: No cough Urologic: No hematuria, dysuria Abdominal:   No nausea, vomiting, diarrhea, bright red blood per rectum, melena, or hematemesis Neurologic:  No visual changes, wkns, changes in mental status. All other systems reviewed and are otherwise negative except as noted above.  Physical Exam   Blood pressure (!) 156/82, pulse 82, temperature 98.7 F (37.1 C), temperature source Oral, resp. rate 18, height 5\' 6"  (1.676 m), weight 275 lb 8 oz (125 kg), SpO2 98 %.  General: Pleasant, NAD Psych: Normal affect. Neuro: Alert and oriented X 3. Moves all extremities spontaneously. HEENT: Normal  Neck: Supple without bruits or JVD. Lungs:  Resp regular and unlabored, CTA. Heart: RRR no s3, s4, or murmurs. Abdomen: Soft, non-tender, non-distended, BS + x 4.  Extremities: No clubbing, cyanosis. 3+ lower leg edema. DP/PT/Radials 2+ and equal bilaterally.  Labs    Troponin [facility] of Care Test)  Recent Labs  08/29/16 1939  TROPIPOC 0.03    Recent Labs  08/30/16 0206 08/30/16 0806  TROPONINI 0.05* 0.05*   Lab Results  Component Value Date   WBC 8.2 08/31/2016   HGB 13.1 08/31/2016   HCT 40.5 08/31/2016   MCV 87.9 08/31/2016   PLT 250 08/31/2016     Recent Labs Lab 08/31/16 0537  NA 139  K 3.6  CL 103  CO2 25  BUN 32*  CREATININE 1.72*  CALCIUM 9.1  GLUCOSE 216*   Lab Results  Component Value Date   CHOL 242 (H) 08/30/2016   HDL 36 (L) 08/30/2016   LDLCALC 180 (H) 08/30/2016   TRIG 130 08/30/2016   No results found for: [facility]   Radiology Studies    Dg Chest 2 View  Result Date: 08/29/2016 CLINICAL DATA:  Shortness of breath for 2 months EXAM: CHEST  2 VIEW COMPARISON:  None. FINDINGS: Cardiac shadow is at the upper limits of normal in size. Mild vascular congestion is seen. Small bilateral pleural effusions and bibasilar atelectatic changes are seen. No bony abnormality is noted. IMPRESSION: Mild  CHF with bibasilar atelectatic changes and effusions. Electronically Signed   By: Inez Catalina M.D.   On: 08/29/2016 19:56    EKG & Cardiac Imaging   EKG: EKG shows atrial fibrillation 86 bpm with T wave inversion anterolaterally, Q waves in V1-V3- -possible old anterospeptal infarct   Echocardiogram:  08/30/2016 Study Conclusions  - Left ventricle: The cavity size was normal. Wall thickness was   increased in a pattern of mild LVH. Severe hypokinesis of the mid   anteroseptal wall, apical septal wall, true apex, mid to apical   anterior wall. Indeterminant diastolic function (atrial   fibrillation). The estimated ejection fraction was 45%. - Aortic valve: There was no stenosis. - Mitral valve: Mildly to moderately calcified annulus. There  was   trivial regurgitation. - Left atrium: The atrium was mildly to moderately dilated. - Right ventricle: The cavity size was normal. Systolic function   was normal. - Pulmonary arteries: No complete TR doppler jet so unable to   estimate PA systolic pressure. - Systemic veins: IVC measured 2.3 cm with < 50% respirophasic   variation, suggesting RA pressure 15 mmHg.  Impressions:  - Normal LV size with mild LV hypertrophy. EF 45% with wall motion   abnormalities as described above, in LAD coronary distribution.   Normal RV size and systolic function. No significant valvular   abnormalities.  Assessment & Plan    Acute systolic heart failure -BNP 862.2 -Creatinine is elevated 1.64-->1.58-->1.72 -Echocardiogram on 08/30/16 showed reduced LV function with EF 45%, mild LVH, severe hypokinesis of the mid anteroseptal wall, apical septal wall, true apex, mid to apical anterior wall, indeterminant diastolic function. -Lasix 40 mg IV daily with good response.  -Wt 281-->277-->275.  Dry weight is about 270 pounds.  -I&O net negative 2230 over 24h, net negative 3827 since admission -Continue to diurese -Will switch metoprolol to her home  Toprol 50 mg   Chest pain -Is typical in nature with DOE and chest burnign/tightness with exertion, resolving with rest. Progressive over the last 2 months -EKG shows atrial fibrillation 86 bpm with T wave inversion anterolaterally,possible old anterospeptal infarct -Troponins have been mildly elevated in a flat pattern, 0.03, 0.05, 0.05 -Wall motion abnormalities on echo and EKG suggestive of CAD  -Continue beta blocker. Will add Imdur. -Advise continue with diuresis through tomorrow and hold diuresis on Thursday in anticipation of cardiac cath on Friday. Monitor renal function. -Will add Imdur -Discontinue Eliquis and start IV heparin  Acute renal insufficiency -Creatinine is elevated 1.64-->1.58-->1.72. Baseline around 1.0 -Probably related to acute CHF.  -ACE-I on hold until Scr improves  Persistent atrial fibrillation -Since at least 2015 (seen at [facility]) -Rate controlled on toprol XL 50 mg daily, continue. BP stable. -This patients CHA2DS2-VASc Score is 5  (HTN, CHF, age (2), female). She is anticoagulated with Eliquis, switched to 2.5 mg bid. Will discontinue Eliquis in anticipation of cardiac cath and start heparin per pharmacy.  Hypertension -Elevated BP 144-186/73-102 -ACE-I on hold due to renal function -Continue amlodipine (increased this morning to 10 mg) and metoprolol, with add hydralazine 10 mg tid and Imdur  Type 2 diabetes -Hgb A1c 10.3 -Needs better diabetes control for optimal cardiac health -managed by IM  Hypercholesterolemia: LDL 180 - recommend statin therapy- will start Crestor 20 mg daily.   Signed, Timoth Schara [place], NP-C 08/31/2016, 4:08 PM Pager: [phone] Patient seen and examined and history reviewed. Agree with above findings and plan. 81 yo WF with history of DM, HTN, obesity, HLD, and family history of CAD presents with 2 month history of worsening CHF and chest pain c/w unstable angina. She has chronic AFib managed with rate control and  Eliquis. [place] Vasc score of 5. Typical anginal symptoms with exertion. Since in hospital symptoms have improved significantly with diuresis. Weight is down 6 lbs. She has CKD stage 3.  On exam she is obese. Unable to assess JV pulse Lungs are clear.  CV IRR without gallop or murmur Abd obese soft NT 2+ edema.  Ecg shows AFib with controlled rate. Evidence of old septal infarct. Troponin minimally elevated. Echo shows EF 45% with regional variation.  Impression:  1. Acute on chronic combined systolic and diastolic CHF. Agree with IV diuresis. Resume Toprol XL 50  mg daily. Will add oral nitrates and hydralazine. Hold ACEi/ARB/Aldactone due to CKD. Would like to diurese more next 2 days then hold diuretics on Thursday in anticipation for cardiac cath Fri. 2. Unstable angina. Patient at high risk for CAD with typical symptoms, Ecg  And Echo findings. I don't think stress testing would be very useful here. Would recommend cardiac cath this admission. Will hold Eliquis. Cover with IV heparin. Will need to protect renal function as much as possible. Tentatively plan for angiogram on Fri. 3. HTN. Adjust meds as noted in #1 4. CKD. Monitor with diuresis. Probably hold diuretics for day prior to cath 5. HLD will start crestor 20 mg daily. 6. AFib continue to manage with rate control and anticoagulation 7. DM poorly controlled- management per primary team.  Elyon Zoll [place], [place] 08/31/2016 4:09 PM

## 2016-08-31 NOTE — Evaluation (Deleted)
Physical Therapy Evaluation Patient Details Name: Nohelia Valenza MRN: [id number] DOB: 11/19/1935 Today's Date: 08/31/2016   History of Present Illness  81 y.o. female presenting with worsening dyspnea and admitted with CHF exacerbation. PMH is significant for T2DM on insulin, persistent afib, morbid obesity, HTN, gout, and pancreatitis.    Clinical Impression  Increased DOE noted today as compared to yesterday's session. Pt very motivated to participate with therapy and increase mobility. PT to continue per POC.    Follow Up Recommendations Home health PT;Supervision for mobility/OOB    Equipment Recommendations  None recommended by PT    Recommendations for Other Services       Precautions / Restrictions Precautions Precautions: None      Mobility  Bed Mobility Overal bed mobility: Modified Independent                Transfers   Equipment used: Ambulation equipment used   Sit to Stand: Min guard Stand pivot transfers: Min guard       General transfer comment: Pt demo good transfer technique for sit to stand and SPT to [facility].  Ambulation/Gait Ambulation/Gait assistance: Min guard Ambulation Distance (Feet): 25 Feet Assistive device: Rolling walker (2 wheeled) Gait Pattern/deviations: Step-through pattern;Decreased stride length Gait velocity: decreased Gait velocity interpretation: Below normal speed for age/gender General Gait Details: DOE with ambulation short distances. Pt ambulated on RA with sats at 95%.  Stairs            Wheelchair Mobility    Modified Rankin (Stroke Patients Only)       Balance   Sitting-balance support: Feet supported;No upper extremity supported Sitting balance-Leahy Scale: Good     Standing balance support: Bilateral upper extremity supported;During functional activity Standing balance-Leahy Scale: Fair Standing balance comment: RW needed for ambulation                             Pertinent  Vitals/Pain Pain Assessment: No/denies pain    Home Living                        Prior Function                 Hand Dominance        Extremity/Trunk Assessment                Communication      Cognition Arousal/Alertness: Awake/alert Behavior During Therapy: WFL for tasks assessed/performed Overall Cognitive Status: Within Functional Limits for tasks assessed                      General Comments      Exercises     Assessment/Plan    PT Assessment Patient needs continued PT services  PT Problem List Decreased strength;Decreased activity tolerance;Decreased mobility;Cardiopulmonary status limiting activity;Decreased balance          PT Treatment Interventions Gait training;Stair training;Functional mobility training;Balance training;Therapeutic exercise;Therapeutic activities;Patient/family education    PT Goals (Current goals can be found in the Care Plan section)  Acute Rehab PT Goals Patient Stated Goal: independence PT Goal Formulation: With patient Time For Goal Achievement: 09/13/16 Potential to Achieve Goals: Good    Frequency Min 3X/week   Barriers to discharge        Co-evaluation               End of Session Equipment Utilized During Treatment: Gait belt Activity Tolerance:  Patient tolerated treatment well Patient left: in chair;with call bell/phone within reach Nurse Communication: Mobility status         Time: 4373-5789 PT Time Calculation (min) (ACUTE ONLY): 24 min   Charges:     PT Treatments $Gait Training: 23-37 mins   PT G Codes:        Lorriane Shire 08/31/2016, 9:53 AM

## 2016-08-31 NOTE — Progress Notes (Addendum)
Inpatient Diabetes Program Recommendations  AACE/ADA: New Consensus Statement on Inpatient Glycemic Control (2015)  Target Ranges:  Prepandial:   less than 140 mg/dL      Peak postprandial:   less than 180 mg/dL (1-2 hours)      Critically ill patients:  140 - 180 mg/dL   Lab Results  Component Value Date   GLUCAP 213 (H) 08/31/2016   HGBA1C 10.3 (H) 08/30/2016    Review of Glycemic Control:  Results for CORALEIGH, SHEERAN (MRN [id number]) as of 08/31/2016 11:26  Ref. Range 08/30/2016 07:42 08/30/2016 11:35 08/30/2016 14:10 08/30/2016 16:37 08/30/2016 21:11 08/31/2016 07:47  Glucose-Capillary Latest Ref Range: 65 - 99 mg/dL 206 (H) 288 (H) 240 (H) 302 (H) 314 (H) 213 (H)   Inpatient Diabetes Program Recommendations:   Please consider increasing Lantus to 20 units q HS-this is home dose.  Note that A1C is elevated as well.  Needs improved glycemic control.  Thanks, Adah Perl, RN, BC-ADM Inpatient Diabetes Coordinator Pager [phone] (8a-5p)

## 2016-08-31 NOTE — Evaluation (Signed)
Occupational Therapy Evaluation Patient Details Name: Kristina Howell MRN: [id number] DOB: 10/26/1935 Today's Date: 08/31/2016    History of Present Illness 81 y.o. female presenting with worsening dyspnea and admitted with CHF exacerbation. PMH is significant for T2DM on insulin, persistent afib, morbid obesity, HTN, gout, and pancreatitis.     Clinical Impression   Pt was walking with a walker and avoided socks due to inability to reach her feet.. Presents with decreased activity tolerance, generalized weakness, obesity and impaired standing balance interfering with ability to perform ADL and ADL transfers at her baseline. Will follow acutely and educate in energy conservation and use of AE for LB bathing and dressing.     Follow Up Recommendations  Home health OT    Equipment Recommendations  None recommended by OT    Recommendations for Other Services       Precautions / Restrictions Precautions Precautions: None Restrictions Weight Bearing Restrictions: No      Mobility Bed Mobility Pt in chair.                Transfers Overall transfer level: Needs assistance Equipment used: Rolling walker (2 wheeled) Transfers: Sit to/from Stand Sit to Stand: Supervision        General transfer comment: no physical assist, supervision for safety    Balance   Sitting-balance support: Feet supported;No upper extremity supported Sitting balance-Leahy Scale: Good     Standing balance support: Bilateral upper extremity supported;During functional activity Standing balance-Leahy Scale: Fair Standing balance comment: RW needed for ambulation                            ADL Overall ADL's : Needs assistance/impaired Eating/Feeding: Independent;Sitting   Grooming: Oral care;Standing;Supervision/safety;Wash/dry hands;Wash/dry face;Set up;Sitting   Upper Body Bathing: Minimal assistance;Sitting Upper Body Bathing Details (indicate cue type and reason):  assisted for back Lower Body Bathing: Maximal assistance;Sit to/from stand   Upper Body Dressing : Set up;Sitting   Lower Body Dressing: Maximal assistance;Sit to/from stand   Toilet Transfer: Supervision/safety;Ambulation;BSC;RW   Toileting- Clothing Manipulation and Hygiene: Minimal assistance;Sit to/from stand       Functional mobility during ADLs: Supervision/safety;Rolling walker       Vision     Perception     Praxis      Pertinent Vitals/Pain Pain Assessment: No/denies pain     Hand Dominance Right   Extremity/Trunk Assessment Upper Extremity Assessment Upper Extremity Assessment: Generalized weakness   Lower Extremity Assessment Lower Extremity Assessment: Defer to PT evaluation   Cervical / Trunk Assessment Cervical / Trunk Assessment: Normal   Communication Communication Communication: No difficulties   Cognition Arousal/Alertness: Awake/alert Behavior During Therapy: WFL for tasks assessed/performed Overall Cognitive Status: Within Functional Limits for tasks assessed                     General Comments       Exercises       Shoulder Instructions      Home Living Family/patient expects to be discharged to:: Private residence Living Arrangements: Spouse/significant other (spouse can help minimally) Available Help at Discharge: Family;Available 24 hours/day Type of Home: House (one step into den) Home Access: Level entry     Home Layout: One level     Bathroom Shower/Tub: Occupational psychologist: Handicapped height     Home Equipment: Environmental consultant - 2 wheels;Cane - single point;Bedside commode;Shower seat  Prior Functioning/Environment Level of Independence: Independent with assistive device(s)        Comments: used cane or RW, avoided socks, husband cut toenails        OT Problem List: Decreased activity tolerance;Decreased strength;Impaired balance (sitting and/or standing);Decreased knowledge of use  of DME or AE;Cardiopulmonary status limiting activity;Obesity   OT Treatment/Interventions: Self-care/ADL training;Energy conservation;DME and/or AE instruction;Patient/family education;Therapeutic activities    OT Goals(Current goals can be found in the care plan section) Acute Rehab OT Goals Patient Stated Goal: independence OT Goal Formulation: With patient Time For Goal Achievement: 09/07/16 Potential to Achieve Goals: Good ADL Goals Pt Will Perform Grooming: with modified independence;standing (3 activites) Pt Will Perform Lower Body Bathing: with modified independence;with adaptive equipment;sit to/from stand Pt Will Perform Lower Body Dressing: with modified independence;sit to/from stand;with adaptive equipment Pt Will Transfer to Toilet: with modified independence;ambulating Pt Will Perform Toileting - Clothing Manipulation and hygiene: with modified independence;sit to/from stand Pt Will Perform Tub/Shower Transfer: Shower transfer;ambulating;shower seat;with supervision;rolling walker Additional ADL Goal #1: Pt will state at least 3 energy conservation strategies as instructed.  OT Frequency: Min 2X/week   Barriers to D/C:            Co-evaluation              End of Session Equipment Utilized During Treatment: Rolling walker;Oxygen (1L)  Activity Tolerance: Patient limited by fatigue (DOE) Patient left:     Time: 7619-5093 OT Time Calculation (min): 40 min Charges:  OT General Charges $OT Visit: 1 Procedure OT Evaluation $OT Eval Moderate Complexity: 1 Procedure OT Treatments $Self Care/Home Management : 23-37 mins G-Codes:    Malka So 08/31/2016, 10:37 AM  [phone]

## 2016-09-01 DIAGNOSIS — Z7901 Long term (current) use of anticoagulants: Secondary | ICD-10-CM

## 2016-09-01 LAB — APTT
APTT: 61 s — AB (ref 24–36)
aPTT: 48 seconds — ABNORMAL HIGH (ref 24–36)
aPTT: 114 seconds — ABNORMAL HIGH (ref 24–36)

## 2016-09-01 LAB — CBC
HEMATOCRIT: 41.1 % (ref 36.0–46.0)
HEMOGLOBIN: 13.2 g/dL (ref 12.0–15.0)
MCH: 28.4 pg (ref 26.0–34.0)
MCHC: 32.1 g/dL (ref 30.0–36.0)
MCV: 88.6 fL (ref 78.0–100.0)
PLATELETS: 253 10*3/uL (ref 150–400)
RBC: 4.64 MIL/uL (ref 3.87–5.11)
RDW: 15.2 % (ref 11.5–15.5)
WBC: 10.1 10*3/uL (ref 4.0–10.5)

## 2016-09-01 LAB — BASIC METABOLIC PANEL
ANION GAP: 10 (ref 5–15)
BUN: 38 mg/dL — ABNORMAL HIGH (ref 6–20)
CHLORIDE: 101 mmol/L (ref 101–111)
CO2: 25 mmol/L (ref 22–32)
Calcium: 8.8 mg/dL — ABNORMAL LOW (ref 8.9–10.3)
Creatinine, Ser: 1.87 mg/dL — ABNORMAL HIGH (ref 0.44–1.00)
GFR calc Af Amer: 29 mL/min — ABNORMAL LOW (ref >60)
GFR, EST NON AFRICAN AMERICAN: 25 mL/min — AB (ref >60)
GLUCOSE: 271 mg/dL — AB (ref 65–99)
POTASSIUM: 3.8 mmol/L (ref 3.5–5.1)
SODIUM: 136 mmol/L (ref 135–145)

## 2016-09-01 LAB — GLUCOSE, CAPILLARY
GLUCOSE-CAPILLARY: 246 mg/dL — AB (ref 65–99)
GLUCOSE-CAPILLARY: 278 mg/dL — AB (ref 65–99)
Glucose-Capillary: 250 mg/dL — ABNORMAL HIGH (ref 65–99)
Glucose-Capillary: 283 mg/dL — ABNORMAL HIGH (ref 65–99)

## 2016-09-01 LAB — HEPARIN LEVEL (UNFRACTIONATED)
HEPARIN UNFRACTIONATED: 1.05 [IU]/mL — AB (ref 0.30–0.70)
Heparin Unfractionated: 0.7 IU/mL (ref 0.30–0.70)

## 2016-09-01 MED ORDER — INSULIN GLARGINE 100 UNIT/ML ~~LOC~~ SOLN
13.0000 [IU] | Freq: Every day | SUBCUTANEOUS | Status: DC
  Administered 2016-09-01: 13 [IU] via SUBCUTANEOUS
  Filled 2016-09-01: qty 0.13

## 2016-09-01 MED ORDER — INSULIN ASPART 100 UNIT/ML ~~LOC~~ SOLN
0.0000 [IU] | Freq: Three times a day (TID) | SUBCUTANEOUS | Status: DC
  Administered 2016-09-01: 5 [IU] via SUBCUTANEOUS
  Administered 2016-09-01: 8 [IU] via SUBCUTANEOUS
  Administered 2016-09-02: 3 [IU] via SUBCUTANEOUS
  Administered 2016-09-02 (×2): 5 [IU] via SUBCUTANEOUS

## 2016-09-01 NOTE — Progress Notes (Signed)
Family Medicine Teaching Service Daily Progress Note Intern Pager: [phone]  Patient name: Kristina Howell Medical record number: [id number] Date of birth: June 07, 1936 Age: 81 y.o. Gender: female  Primary Care Provider: Tawni Carnes, PA-C Consultants: None Code Status: Full (confirmed this admission)  Pt Overview and Major Events to Date:  08/29/2016: Admit to FPTS  Assessment and Plan: Kristina Howell is a 81 y.o. female presenting with worsening dyspnea. PMH is significant for T2DM on insulin, persistent afib, morbid obesity, HTN, gout, and pancreatitis.    Worsening Dyspnea, new CHF diagnosis: Fluid overloaded. CXR with c/w mild CHF with bibasilar atelectatic changes and effusions. EKG with afib (chronic) and possible old anterolateral infarct. BNP 862.2.  Pt reports dry weight ~270lbs.  - Echo 08/30/16 - LVEF 45%, mild LV hypertrophy, +severe hypokinesis of mid anteroseptal wall, apical septal wall, true apex, mid to apical ant wall. Indeterminant diastolic function (afib) - Consider transitioning from 40 IV lasix Qd to 40 PO Lasix Qd as a maintenance dose - Cards Recs: continue to diurese (but hold thurs for cath friday), added imdur  - Initial good UOP with 40 mg lasix IV; Continue - Strict I/Os: net -4.5L since admit - Daily wts 280 lbs >>275 lb 8 oz - Troponin plateued - 0.03 > 0.05>0.05 > 0.05  likely demand ischemia - PT/OT - HH PT (ordered F2F)  T2DM: Takes lantus 20 U QHS, took prior to admission.  - hgb A1c 13.1 - CBGs qACHS - Lantus 11 U nightly >> inc to 13u nightly due to elevated sugars  - Sensitive SSI >> moderate sliding scale  Diarrhea - non-bloody, two episodes yesterday - monitor, no recent abx use, likely not c diff - may be caused by potassium given this admission  HTN: BP elevated up to 202/133 on admission, has stayed hypertensive this admission. Added 5 mg Amlodipine yesterday because benazepril held for AKI. Added PRN hydralazine for high pressures.  -  BP better controlled overnight - hold benazepril, consider switching to losartan at DC - cont norvasc 10 mg while holding ACE for AKI  AKI: SCr 1.64 > >> >1.87  Baseline may be ~ 1.0 (2015). Suspect prerenal in setting of CHF exacerbation as BUN:Cr 21.  - Hold home benazepril until SCr improves  Hypercholesterolemia: LDL 180 - recommend statin therapy- Crestor 20 mg daily started per cards  R foot ulcer: Callused, does not appear infected. PCP had referred to wound care for further debridement but has not yet had appointment.  - dead skin edges of the callus removed, ulcer present below, does not seem to palpate to bone - Consult WOC while inpatient  Persistent afib: On eliquis 5 mg daily. Used to be on BID dosing but caused too much pinpoint bleeding per patient. Rate controlled on metoprolol.  - Per cards recs >>DC Eliquis and start IV heparin for anticipated cath friday - transitioned back to home toprol 50 mg  Hx of gout: - Continue home allopurinol  FEN/GI: Heart healthy/carb modified Prophylaxis: On eliquis  Disposition: will follow PT recs, maybe home  Subjective:  Patient comfortable and without complaints this morning. No acute events overnight. Voices understanding of the plan for her care from cardiology.  Objective: Temp:  [97.5 F (36.4 C)-98.7 F (37.1 C)] 97.7 F (36.5 C) (02/14 0423) Pulse Rate:  [79-100] 79 (02/14 0423) Resp:  [18] 18 (02/14 0423) BP: (128-157)/(60-82) 157/72 (02/14 0423) SpO2:  [98 %-100 %] 98 % (02/14 0423) Weight:  [277 lb 4.8 oz (125.8 kg)] 277 lb  4.8 oz (125.8 kg) (02/14 0423) Physical Exam: General: NAD, rests comfortably with breakfast tray Cardiovascular: RRR, no m/r/g Respiratory: CTA bil, diminished sounds at the lung bases, no W/R/R Abdomen: obese, soft, nontender, nondistended Extremities: 1-2+ pitting edema, dependent, now in upper thighs Derm: chronic hyperpigmentation venous changes over lower leg. Callused region to R  plantar eminence below great toe about 2cmx2.5cm.   Laboratory:  Recent Labs Lab 08/29/16 1918 08/31/16 0537  WBC 10.1 8.2  HGB 13.8 13.1  HCT 42.6 40.5  PLT 245 250    Recent Labs Lab 08/30/16 0206 08/31/16 0537 09/01/16 0353  NA 139 139 136  K 3.4* 3.6 3.8  CL 106 103 101  CO2 24 25 25   BUN 32* 32* 38*  CREATININE 1.58* 1.72* 1.87*  CALCIUM 9.2 9.1 8.8*  GLUCOSE 246* 216* 271*   Pertinent labs this admission Lipid panel Chol 242 Tri 130 HDL 36 LDL 180 Troponin 0.03 >0.05>0.05  Imaging/Diagnostic Tests: Dg Chest 2 View: 08/29/2016 FINDINGS: Cardiac shadow is at the upper limits of normal in size. Mild vascular congestion is seen. Small bilateral pleural effusions and bibasilar atelectatic changes are seen. No bony abnormality is noted. IMPRESSION: Mild CHF with bibasilar atelectatic changes and effusions.   Everrett Coombe, MD 09/01/2016, 9:00 AM PGY-1, [place] Intern pager: [phone], text pages welcome

## 2016-09-01 NOTE — Progress Notes (Signed)
ANTICOAGULATION CONSULT NOTE - Follow Up Consult  Pharmacy Consult for Heparin Indication: chest pain/ACS + ACS  No Known Allergies  Patient Measurements: Height: 5\' 6"  (167.6 cm) Weight: 277 lb 4.8 oz (125.8 kg) IBW/kg (Calculated) : 59.3 Heparin Dosing Weight:   Vital Signs: Temp: 98.2 F (36.8 C) (02/14 1154) Temp Source: Oral (02/14 1154) BP: 152/68 (02/14 1154) Pulse Rate: 90 (02/14 1154)  Labs:  Recent Labs  08/29/16 1918 08/30/16 0206 08/30/16 0806 08/31/16 0537 09/01/16 0353 09/01/16 1336  HGB 13.8  --   --  13.1  --  13.2  HCT 42.6  --   --  40.5  --  41.1  PLT 245  --   --  250  --  253  APTT  --   --   --   --  48* 114*  HEPARINUNFRC  --   --   --   --  1.05*  --   CREATININE 1.64* 1.58*  --  1.72* 1.87*  --   TROPONINI  --  0.05* 0.05*  --   --   --     Estimated Creatinine Clearance: 33.6 mL/min (by C-G formula based on SCr of 1.87 mg/dL (H)).  Assessment:  Anticoag: Eliquis for afib.  Eliquis>>IV heparin on 2/13 for planned cath on Friday.. - aPTT 114. CBC WNL  Goal of Therapy:  aPTT 66-102 seconds Monitor platelets by anticoagulation protocol: Yes   Plan:  Decrease IV heparin to 1550 units/hr Recheck aPTT and HL in 6 hrs.   Ulis Kaps S. Alford Highland, PharmD, BCPS Clinical Staff Pharmacist Pager [phone]  Eilene Ghazi Stillinger 09/01/2016,2:40 PM

## 2016-09-01 NOTE — Progress Notes (Signed)
Progress Note  Patient Name: Kristina Howell Date of Encounter: 09/01/2016  Primary Cardiologist: New/Jenetta Wease  Subjective   Feeling better today. Breathing is improved. Less edema. No chest burning.  Inpatient Medications    Scheduled Meds: . allopurinol  300 mg Oral Daily  . amLODipine  10 mg Oral Daily  . furosemide  40 mg Intravenous Daily  . hydrALAZINE  10 mg Oral Q8H  . insulin aspart  0-9 Units Subcutaneous TID WC  . insulin glargine  11 Units Subcutaneous QHS  . isosorbide mononitrate  30 mg Oral Daily  . living well with diabetes book   Does not apply Once  . metoprolol succinate  50 mg Oral Daily  . silver sulfADIAZINE   Topical Daily  . sodium chloride flush  3 mL Intravenous Q12H   Continuous Infusions: . heparin 1,700 Units/hr (09/01/16 0545)   PRN Meds: sodium chloride, acetaminophen, hydrALAZINE, ondansetron (ZOFRAN) IV, sodium chloride flush   Vital Signs    Vitals:   08/31/16 1705 08/31/16 2028 09/01/16 0423 09/01/16 1016  BP: (!) 152/82 128/60 (!) 157/72 (!) 168/66  Pulse:  90 79 73  Resp:  18 18   Temp:  97.5 F (36.4 C) 97.7 F (36.5 C)   TempSrc:  Oral Oral   SpO2:  100% 98%   Weight:   277 lb 4.8 oz (125.8 kg)   Height:        Intake/Output Summary (Last 24 hours) at 09/01/16 1027 Last data filed at 09/01/16 1020  Gross per 24 hour  Intake          1302.93 ml  Output             1700 ml  Net          -397.07 ml   Filed Weights   08/30/16 0602 08/31/16 0455 09/01/16 0423  Weight: 277 lb 8 oz (125.9 kg) 275 lb 8 oz (125 kg) 277 lb 4.8 oz (125.8 kg)    Telemetry    Atrial fibrillation with controlled rate- Personally Reviewed  ECG    Afib with anterolateral T wave inversion. Q V1-3- Personally Reviewed  Physical Exam   GEN: No acute distress.  obese Neck: No JVD Cardiac: IRRR, no murmurs, rubs, or gallops. 2+ LE edema Respiratory: Clear to auscultation bilaterally. GI: Soft, nontender, non-distended  MS: No edema; No  deformity. Neuro:  Nonfocal  Psych: Normal affect   Labs    Chemistry Recent Labs Lab 08/30/16 0206 08/31/16 0537 09/01/16 0353  NA 139 139 136  K 3.4* 3.6 3.8  CL 106 103 101  CO2 24 25 25   GLUCOSE 246* 216* 271*  BUN 32* 32* 38*  CREATININE 1.58* 1.72* 1.87*  CALCIUM 9.2 9.1 8.8*  GFRNONAA 30* 27* 25*  GFRAA 35* 32* 29*  ANIONGAP 9 11 10      Hematology Recent Labs Lab 08/29/16 1918 08/31/16 0537  WBC 10.1 8.2  RBC 4.85 4.61  HGB 13.8 13.1  HCT 42.6 40.5  MCV 87.8 87.9  MCH 28.5 28.4  MCHC 32.4 32.3  RDW 14.7 14.9  PLT 245 250    Cardiac Enzymes Recent Labs Lab 08/30/16 0206 08/30/16 0806  TROPONINI 0.05* 0.05*    Recent Labs Lab 08/29/16 1939  TROPIPOC 0.03     BNP Recent Labs Lab 08/29/16 1940  BNP 862.2*     DDimer No results for input(s): DDIMER in the last 168 hours.   Radiology    No results found.  Cardiac Studies  Echocardiogram:  08/30/2016 Study Conclusions  - Left ventricle: The cavity size was normal. Wall thickness was increased in a pattern of mild LVH. Severe hypokinesis of the mid anteroseptal wall, apical septal wall, true apex, mid to apical anterior wall. Indeterminant diastolic function (atrial fibrillation). The estimated ejection fraction was 45%. - Aortic valve: There was no stenosis. - Mitral valve: Mildly to moderately calcified annulus. There was trivial regurgitation. - Left atrium: The atrium was mildly to moderately dilated. - Right ventricle: The cavity size was normal. Systolic function was normal. - Pulmonary arteries: No complete TR doppler jet so unable to estimate PA systolic pressure. - Systemic veins: IVC measured 2.3 cm with < 50% respirophasic variation, suggesting RA pressure 15 mmHg.  Impressions:  - Normal LV size with mild LV hypertrophy. EF 45% with wall motion abnormalities as described above, in LAD coronary distribution. Normal RV size and systolic  function. No significant valvular abnormalities.  Patient Profile     81 y.o. female with past medical history significant for Type 2 DM on insulin, persistent afib since 2015, morbid obesity, HTN, gout, and pancreatitis who presented to [facility] ED on the evening of 08/29/2016 for worsening shortness of breath and chest pain. She has evidence of acute CHF and elevated troponin.  Assessment & Plan    1. Acute systolic heart failure -BNP 862.2 -Creatinine is elevated 1.64-->1.58-->1.72-->1.87 -Echocardiogram on 08/30/16 showed reduced LV function with EF 45%, mild LVH, severe hypokinesis of the mid anteroseptal wall, apical septal wall, true apex, mid to apical anterior wall, indeterminant diastolic function. -Lasix 40 mg IV daily with good response.  -Wt 281-->277-->275.  Dry weight is about 270 pounds.  -I&O net negative  4284 since admission -Continue to diurese today. Plan to hold lasix after this am dose to allow kidney function to equilibrate prior to cardiac cath on Friday. - Toprol 50 mg  - Avoid ACEi,ARB,aldactone due to acute on chronic RF.  Chest pain -Is typical in nature with DOE and chest burnign/tightness with exertion, resolving with rest. Progressive over the last 2 months -EKG shows atrial fibrillation 86 bpm with T wave inversion anterolaterally,possible old anterospeptal infarct -Troponins have been mildly elevated in a flat pattern, 0.03, 0.05, 0.05 -Wall motion abnormalities on echo and EKG suggestive of CAD  -Continue beta blocker. Will add Imdur. -Plan cardiac cath on Friday if renal function stable. -Will add Imdur -Discontinue Eliquis and start IV heparin  Acute renal insufficiency -Creatinine is elevated 1.64-->1.58-->1.72. Baseline around 1.0 -Probably related to acute CHF.  -ACE-I on hold until Scr improves  Persistent atrial fibrillation -Since at least 2015 (seen at [facility]) -Rate controlled on toprol XL 50 mg daily, continue. BP  stable. -This patients CHA2DS2-VASc Score is 5  (HTN, CHF, age (2), female). She is anticoagulated with Eliquis, switched to 2.5 mg bid. Will discontinue Eliquis in anticipation of cardiac cath and start heparin per pharmacy.  Hypertension -Elevated BP 144-186/73-102- improved -ACE-I on hold due to renal function -Continue amlodipine , and metoprolol, with add hydralazine 10 mg tid and Imdur  Type 2 diabetes -Hgb A1c 10.3 -Needs better diabetes control for optimal cardiac health -managed by IM  Hypercholesterolemia: LDL 180 - recommend statin therapy- will start Crestor 20 mg daily.    Signed, Jaunita Mikels [place], MD  09/01/2016, 10:27 AM

## 2016-09-01 NOTE — Progress Notes (Signed)
ANTICOAGULATION CONSULT NOTE - Follow Up Consult  Pharmacy Consult for heparin Indication: Afib and USAP  Labs:  Recent Labs  08/29/16 1918 08/30/16 0206 08/30/16 0806 08/31/16 0537 09/01/16 0353  HGB 13.8  --   --  13.1  --   HCT 42.6  --   --  40.5  --   PLT 245  --   --  250  --   APTT  --   --   --   --  48*  HEPARINUNFRC  --   --   --   --  1.05*  CREATININE 1.64* 1.58*  --  1.72* 1.87*  TROPONINI  --  0.05* 0.05*  --   --     Assessment: 80yo female subtherapeutic on heparin with initial dosing while Eliquis on hold.  Goal of Therapy:  aPTT 66-102 seconds   Plan:  Will increase heparin gtt by ~3 units/kgABW/hr to 1700 units/hr and check PTT in 8hr.  Wynona Neat, PharmD, BCPS  09/01/2016,5:37 AM

## 2016-09-01 NOTE — Progress Notes (Signed)
Occupational Therapy Treatment Patient Details Name: Kristina Howell MRN: [id number] DOB: 09-30-1935 Today's Date: 09/01/2016    History of present illness 81 y.o. female presenting with worsening dyspnea and admitted with CHF exacerbation. PMH is significant for T2DM on insulin, persistent afib, morbid obesity, HTN, gout, and pancreatitis.     OT comments  Educated pt in energy conservation and provided handout. Practiced use of AE for LB dressing. Pt is aware where she may purchase.  Follow Up Recommendations  Home health OT    Equipment Recommendations  None recommended by OT    Recommendations for Other Services      Precautions / Restrictions Restrictions Weight Bearing Restrictions: No       Mobility Bed Mobility Overal bed mobility: Modified Independent             General bed mobility comments: increased time  Transfers Overall transfer level: Needs assistance Equipment used: Rolling walker (2 wheeled) Transfers: Sit to/from Stand Sit to Stand: Supervision         General transfer comment: no physical assist, supervision for safety, uses momentum from bed    Balance               Standing balance comment: RW needed for ambulation                   ADL Overall ADL's : Needs assistance/impaired               Lower Body Bathing Details (indicate cue type and reason): educated in use of long handled bath sponge and reacher     Lower Body Dressing: Min guard;Sit to/from stand Lower Body Dressing Details (indicate cue type and reason): educated and practiced use of sock aide, reacher, long shoe horn Toilet Transfer: Supervision/safety;Ambulation;BSC;RW   Toileting- Clothing Manipulation and Hygiene: Sit to/from stand;Min guard       Functional mobility during ADLs: Supervision/safety;Rolling walker General ADL Comments: Instructed in energy conservation and provided handout to reinforce.      Vision                      Perception     Praxis      Cognition   Behavior During Therapy: WFL for tasks assessed/performed Overall Cognitive Status: Within Functional Limits for tasks assessed                       Extremity/Trunk Assessment               Exercises     Shoulder Instructions       General Comments      Pertinent Vitals/ Pain       Pain Assessment: No/denies pain  Home Living                                          Prior Functioning/Environment              Frequency  Min 2X/week        Progress Toward Goals  OT Goals(current goals can now be found in the care plan section)  Progress towards OT goals: Progressing toward goals  Acute Rehab OT Goals Patient Stated Goal: independence Time For Goal Achievement: 09/07/16 Potential to Achieve Goals: Good  Plan Discharge plan remains appropriate    Co-evaluation  End of Session Equipment Utilized During Treatment: Rolling walker;Oxygen   Activity Tolerance Patient tolerated treatment well   Patient Left in chair;with call bell/phone within reach   Nurse Communication          Time: 9471-2527 OT Time Calculation (min): 21 min  Charges: OT General Charges $OT Visit: 1 Procedure OT Treatments $Self Care/Home Management : 8-22 mins  Malka So 09/01/2016, 10:11 AM  [phone]

## 2016-09-01 NOTE — Progress Notes (Signed)
ANTICOAGULATION CONSULT NOTE - Follow Up Consult  Pharmacy Consult for Heparin Indication: chest pain/ACS   No Known Allergies  Patient Measurements: Height: 5\' 6"  (167.6 cm) Weight: 277 lb 4.8 oz (125.8 kg) IBW/kg (Calculated) : 59.3 Heparin Dosing Weight:   Vital Signs: Temp: 97.9 F (36.6 C) (02/14 1928) Temp Source: Oral (02/14 1928) BP: 134/42 (02/14 1928) Pulse Rate: 73 (02/14 1928)  Labs:  Recent Labs  08/30/16 0206 08/30/16 0806 08/31/16 0537 09/01/16 0353 09/01/16 1336 09/01/16 2050  HGB  --   --  13.1  --  13.2  --   HCT  --   --  40.5  --  41.1  --   PLT  --   --  250  --  253  --   APTT  --   --   --  48* 114* 61*  HEPARINUNFRC  --   --   --  1.05*  --  0.70  CREATININE 1.58*  --  1.72* 1.87*  --   --   TROPONINI 0.05* 0.05*  --   --   --   --     Estimated Creatinine Clearance: 33.6 mL/min (by C-G formula based on SCr of 1.87 mg/dL (H)).  Assessment:  Anticoag: Eliquis for afib.  Eliquis>>IV heparin on 2/13 for planned cath on Friday.. - aPTT 114. CBC WNL  Repeat aPTT is slightly subtherapeutic at 61 seconds on heparin 1550 units/hr. Nurse reports no issues with infusion or bleeding.   Goal of Therapy:  aPTT 66-102 seconds Monitor platelets by anticoagulation protocol: Yes   Plan:  Increase heparin to 1600 units/hr Daily aPTT/HL Monitor s/sx of bleeding   Andrey Cota. Diona Foley, PharmD, BCPS Clinical Pharmacist [phone] 09/01/2016,9:20 PM

## 2016-09-01 NOTE — Consult Note (Signed)
   [facility] CM Inpatient Consult   09/01/2016  Kristina Howell [birth year] [id number]   Patient evaluated for community based chronic disease management services with [facility] as a benefit of patient's [employer] Medicare Insurance. Chart review reveals the patient is Kristina Howell a 81 y.o.femalepresenting with worsening dyspnea. PMH is significant for T2DM on insulin, persistent afib, morbid obesity, HTN, gout, and pancreatitis. Also, noted that the patient's Hemoglobin A1c is 10.3 per charted documentation.  Spoke with patient at bedside to explain [place] [facility].Patient states she is on insulin but she probably doesn't monitor her blood sugar as she needs to.  She states she has no difficulty with medications, her daughter is a Software engineer.  She lives with her husband has good transportation and support. Consent form signed.  Patient will receive post hospital discharge calls for post hospital monitoring and education.  visits for assessments and disease process education.  Left contact information and [facility] literature at bedside. Of note, [facility] Care Management services does not replace or interfere with any services that are arranged by inpatient case management or social work.  For additional questions or referrals please contact:     Natividad Brood, RN BSN [facility] Liaison  [phone] business mobile phone Toll free office [phone]

## 2016-09-02 DIAGNOSIS — Z7901 Long term (current) use of anticoagulants: Secondary | ICD-10-CM

## 2016-09-02 LAB — CBC
HCT: 39.7 % (ref 36.0–46.0)
Hemoglobin: 12.7 g/dL (ref 12.0–15.0)
MCH: 28.4 pg (ref 26.0–34.0)
MCHC: 32 g/dL (ref 30.0–36.0)
MCV: 88.8 fL (ref 78.0–100.0)
PLATELETS: 237 10*3/uL (ref 150–400)
RBC: 4.47 MIL/uL (ref 3.87–5.11)
RDW: 15.1 % (ref 11.5–15.5)
WBC: 7.4 10*3/uL (ref 4.0–10.5)

## 2016-09-02 LAB — BASIC METABOLIC PANEL
Anion gap: 9 (ref 5–15)
BUN: 39 mg/dL — ABNORMAL HIGH (ref 6–20)
CO2: 28 mmol/L (ref 22–32)
Calcium: 8.9 mg/dL (ref 8.9–10.3)
Chloride: 102 mmol/L (ref 101–111)
Creatinine, Ser: 1.91 mg/dL — ABNORMAL HIGH (ref 0.44–1.00)
GFR calc Af Amer: 28 mL/min — ABNORMAL LOW (ref >60)
GFR calc non Af Amer: 24 mL/min — ABNORMAL LOW (ref >60)
Glucose, Bld: 230 mg/dL — ABNORMAL HIGH (ref 65–99)
Potassium: 4.1 mmol/L (ref 3.5–5.1)
SODIUM: 139 mmol/L (ref 135–145)

## 2016-09-02 LAB — GLUCOSE, CAPILLARY
GLUCOSE-CAPILLARY: 194 mg/dL — AB (ref 65–99)
GLUCOSE-CAPILLARY: 242 mg/dL — AB (ref 65–99)
GLUCOSE-CAPILLARY: 259 mg/dL — AB (ref 65–99)
Glucose-Capillary: 208 mg/dL — ABNORMAL HIGH (ref 65–99)
Glucose-Capillary: 244 mg/dL — ABNORMAL HIGH (ref 65–99)

## 2016-09-02 LAB — HEPARIN LEVEL (UNFRACTIONATED)
HEPARIN UNFRACTIONATED: 0.99 [IU]/mL — AB (ref 0.30–0.70)
Heparin Unfractionated: 0.9 IU/mL — ABNORMAL HIGH (ref 0.30–0.70)

## 2016-09-02 LAB — APTT
APTT: 122 s — AB (ref 24–36)
aPTT: 139 seconds — ABNORMAL HIGH (ref 24–36)

## 2016-09-02 MED ORDER — SODIUM CHLORIDE 0.9 % IV SOLN: 250.0000 mL | INTRAVENOUS | Status: DC | PRN

## 2016-09-02 MED ORDER — ROSUVASTATIN CALCIUM 20 MG PO TABS
20.0000 mg | ORAL_TABLET | Freq: Every day | ORAL | Status: DC
  Administered 2016-09-02 – 2016-09-12 (×11): 20 mg via ORAL
  Filled 2016-09-02 (×11): qty 1

## 2016-09-02 MED ORDER — SODIUM CHLORIDE 0.9% FLUSH
3.0000 mL | Freq: Two times a day (BID) | INTRAVENOUS | Status: DC
  Administered 2016-09-03: 3 mL via INTRAVENOUS

## 2016-09-02 MED ORDER — INSULIN GLARGINE 100 UNIT/ML ~~LOC~~ SOLN
16.0000 [IU] | Freq: Every day | SUBCUTANEOUS | Status: DC
Start: 2016-09-02 — End: 2016-09-02

## 2016-09-02 MED ORDER — SODIUM CHLORIDE 0.9 % IV SOLN: INTRAVENOUS | Status: DC

## 2016-09-02 MED ORDER — INSULIN GLARGINE 100 UNIT/ML ~~LOC~~ SOLN: 16.0000 [IU] | Freq: Every day | SUBCUTANEOUS | Status: DC

## 2016-09-02 MED ORDER — ASPIRIN 81 MG PO CHEW
81.0000 mg | CHEWABLE_TABLET | ORAL | Status: AC
  Administered 2016-09-03: 81 mg via ORAL
  Filled 2016-09-02: qty 1

## 2016-09-02 MED ORDER — INSULIN GLARGINE 100 UNIT/ML ~~LOC~~ SOLN
8.0000 [IU] | Freq: Every day | SUBCUTANEOUS | Status: AC
  Administered 2016-09-02: 8 [IU] via SUBCUTANEOUS
  Filled 2016-09-02: qty 0.08

## 2016-09-02 MED ORDER — SODIUM CHLORIDE 0.9% FLUSH: 3.0000 mL | INTRAVENOUS | Status: DC | PRN

## 2016-09-02 MED ORDER — HYDRALAZINE HCL 25 MG PO TABS
25.0000 mg | ORAL_TABLET | Freq: Three times a day (TID) | ORAL | Status: DC
  Administered 2016-09-02 – 2016-09-16 (×40): 25 mg via ORAL
  Filled 2016-09-02 (×41): qty 1

## 2016-09-02 MED ORDER — INSULIN GLARGINE 100 UNIT/ML ~~LOC~~ SOLN
16.0000 [IU] | Freq: Every day | SUBCUTANEOUS | Status: DC
  Administered 2016-09-03 – 2016-09-04 (×2): 16 [IU] via SUBCUTANEOUS
  Filled 2016-09-02 (×2): qty 0.16

## 2016-09-02 NOTE — Progress Notes (Signed)
ANTICOAGULATION CONSULT NOTE - Follow Up Consult  Pharmacy Consult for Heparin Indication: atrial fibrillation + ACS  No Known Allergies  Patient Measurements: Height: 5\' 6"  (167.6 cm) Weight: 278 lb 3.2 oz (126.2 kg) (Scale B) IBW/kg (Calculated) : 59.3 Heparin Dosing Weight: 90.2 kg  Vital Signs: Temp: 97.5 F (36.4 C) (02/15 1231) Temp Source: Oral (02/15 1231) BP: 165/76 (02/15 1231) Pulse Rate: 76 (02/15 1231)  Labs:  Recent Labs  08/31/16 0537  09/01/16 0353 09/01/16 1336 09/01/16 2050 09/02/16 0440 09/02/16 0757 09/02/16 0958 09/02/16 1525  HGB 13.1  --   --  13.2  --   --   --  12.7  --   HCT 40.5  --   --  41.1  --   --   --  39.7  --   PLT 250  --   --  253  --   --   --  237  --   APTT  --   < > 48* 114* 61*  --  139*  --  122*  HEPARINUNFRC  --   < > 1.05*  --  0.70 0.99*  --   --  0.90*  CREATININE 1.72*  --  1.87*  --   --   --   --  1.91*  --   < > = values in this interval not displayed.  Estimated Creatinine Clearance: 33 mL/min (by C-G formula based on SCr of 1.91 mg/dL (H)).   Assessment: daily - she just decided to do this a few months ago). Eliquis>>IV heparin on 2/13 for planned cath. CHADS2VASC 5 - aPTT 122, HL 0.9. CBC WNL; nurse reports no issues with infusion or bleeding.  Goal of Therapy:  Heparin level 0.3-0.7 units/ml aPTT 66-102 seconds  Monitor platelets by anticoagulation protocol: Yes   Plan:  Decrease heparin to 1350 units/hr 8h HL/aPTT Daily HL and CBC Will need CIN prophylaxis for cath  Andrey Cota. Diona Foley, PharmD, BCPS Clinical Pharmacist [phone] 09/02/2016,4:23 PM

## 2016-09-02 NOTE — Progress Notes (Signed)
Physical Therapy Treatment Patient Details Name: Kristina Howell MRN: [id number] DOB: [birth year] Today's Date: 09/02/2016    History of Present Illness 81 y.o. female presenting with worsening dyspnea and admitted with CHF exacerbation. PMH is significant for T2DM on insulin, persistent afib, morbid obesity, HTN, gout, and pancreatitis.      PT Comments    Pt is up to walk with PT and noted significant improvement from last visit, although pt is worried about how she will take back on her usual home care activities.  She does have help from husband, and may need to be assisted by more help from home care services.  Continue to follow acutely for strengthening and gait as tolerated.  Did need a sitting rest today but is able to maintain her O2 sats with the effort on room air to 97%.  Follow Up Recommendations  Home health PT;Supervision for mobility/OOB     Equipment Recommendations  None recommended by PT    Recommendations for Other Services       Precautions / Restrictions Precautions Precautions: Fall Restrictions Weight Bearing Restrictions: No    Mobility  Bed Mobility               General bed mobility comments: up when PT arrived  Transfers Overall transfer level: Needs assistance Equipment used: Rolling walker (2 wheeled) Transfers: Sit to/from Stand Sit to Stand: Min assist Stand pivot transfers: Min assist       General transfer comment: pt needed help to power up today  Ambulation/Gait Ambulation/Gait assistance: Min guard Ambulation Distance (Feet): 45 Feet (in 2 trips) Assistive device: Rolling walker (2 wheeled) Gait Pattern/deviations: Step-through pattern;Decreased stride length Gait velocity: decreased Gait velocity interpretation: Below normal speed for age/gender     Stairs            Wheelchair Mobility    Modified Rankin (Stroke Patients Only)       Balance     Sitting balance-Leahy Scale: Good     Standing balance  support: Bilateral upper extremity supported;During functional activity Standing balance-Leahy Scale: Fair Standing balance comment: RW needed for ambulation                    Cognition Arousal/Alertness: Awake/alert Behavior During Therapy: WFL for tasks assessed/performed Overall Cognitive Status: Within Functional Limits for tasks assessed                      Exercises General Exercises - Lower Extremity Ankle Circles/Pumps: AROM;Both;10 reps Long Arc Quad: AROM;Both;10 reps Heel Slides: AROM;Both;10 reps Hip ABduction/ADduction: AROM;Both;10 reps    General Comments        Pertinent Vitals/Pain Pain Assessment: No/denies pain    Home Living                      Prior Function            PT Goals (current goals can now be found in the care plan section) Acute Rehab PT Goals Patient Stated Goal: get home to usual activities Progress towards PT goals: Progressing toward goals    Frequency    Min 3X/week      PT Plan Current plan remains appropriate    Co-evaluation             End of Session Equipment Utilized During Treatment: Gait belt Activity Tolerance: Patient tolerated treatment well Patient left: in chair;with call bell/phone within reach;with chair alarm set  Time: 3354-5625 PT Time Calculation (min) (ACUTE ONLY): 34 min  Charges:  $Gait Training: 8-22 mins $Therapeutic Exercise: 8-22 mins                    G Codes:      Ramond Dial 03-Sep-2016, 1:27 PM   Mee Hives, PT MS Acute Rehab Dept. Number: [place] and [place]

## 2016-09-02 NOTE — Progress Notes (Signed)
Progress Note  Patient Name: Kristina Howell Date of Encounter: 09/02/2016  Primary Cardiologist: Dr. Martinique  Subjective   Still with orthopnea and lower extremity edema. No chest discomfort or palpitations overnight or this morning.   Inpatient Medications    Scheduled Meds: . allopurinol  300 mg Oral Daily  . amLODipine  10 mg Oral Daily  . hydrALAZINE  10 mg Oral Q8H  . insulin aspart  0-15 Units Subcutaneous TID WC  . insulin glargine  16 Units Subcutaneous QHS  . isosorbide mononitrate  30 mg Oral Daily  . living well with diabetes book   Does not apply Once  . metoprolol succinate  50 mg Oral Daily  . silver sulfADIAZINE   Topical Daily  . sodium chloride flush  3 mL Intravenous Q12H   Continuous Infusions: . heparin 1,600 Units/hr (09/02/16 0434)   PRN Meds: sodium chloride, acetaminophen, hydrALAZINE, ondansetron (ZOFRAN) IV, sodium chloride flush   Vital Signs    Vitals:   09/01/16 1016 09/01/16 1154 09/01/16 1928 09/02/16 0632  BP: (!) 168/66 (!) 152/68 (!) 134/42 (!) 173/66  Pulse: 73 90 73 75  Resp:  20 19 18   Temp:  98.2 F (36.8 C) 97.9 F (36.6 C) 98 F (36.7 C)  TempSrc:  Oral Oral Oral  SpO2:  100% 99% 98%  Weight:    278 lb 3.2 oz (126.2 kg)  Height:        Intake/Output Summary (Last 24 hours) at 09/02/16 0827 Last data filed at 09/02/16 7371  Gross per 24 hour  Intake              900 ml  Output             1450 ml  Net             -550 ml   Filed Weights   08/31/16 0455 09/01/16 0423 09/02/16 0626  Weight: 275 lb 8 oz (125 kg) 277 lb 4.8 oz (125.8 kg) 278 lb 3.2 oz (126.2 kg)    Telemetry    Atrial fibrillation, HR in mid-50's to 60's.  - Personally Reviewed  ECG   No new tracings.   Physical Exam   General: Well developed, overweight Caucasian female appearing in no acute distress. Head: Normocephalic, atraumatic.  Neck: Supple without bruits, JVD at 10cm. Lungs:  Resp regular and unlabored, decreased breath sounds at  bases bilaterally. Heart: Irregularly irregular, S1, S2, no S3, S4, or murmur; no rub. Abdomen: Soft, non-tender, non-distended with normoactive bowel sounds. No hepatomegaly. No rebound/guarding. No obvious abdominal masses. Extremities: No clubbing or cyanosis. 2+ pitting edema up to mid-shins bilaterally. Distal pedal pulses are 2+ bilaterally. Neuro: Alert and oriented X 3. Moves all extremities spontaneously. Psych: Normal affect.  Labs    Chemistry Recent Labs Lab 08/30/16 0206 08/31/16 0537 09/01/16 0353  NA 139 139 136  K 3.4* 3.6 3.8  CL 106 103 101  CO2 24 25 25   GLUCOSE 246* 216* 271*  BUN 32* 32* 38*  CREATININE 1.58* 1.72* 1.87*  CALCIUM 9.2 9.1 8.8*  GFRNONAA 30* 27* 25*  GFRAA 35* 32* 29*  ANIONGAP 9 11 10      Hematology Recent Labs Lab 08/29/16 1918 08/31/16 0537 09/01/16 1336  WBC 10.1 8.2 10.1  RBC 4.85 4.61 4.64  HGB 13.8 13.1 13.2  HCT 42.6 40.5 41.1  MCV 87.8 87.9 88.6  MCH 28.5 28.4 28.4  MCHC 32.4 32.3 32.1  RDW 14.7 14.9 15.2  PLT 245  250 253    Cardiac Enzymes Recent Labs Lab 08/30/16 0206 08/30/16 0806  TROPONINI 0.05* 0.05*    Recent Labs Lab 08/29/16 1939  TROPIPOC 0.03     BNP Recent Labs Lab 08/29/16 1940  BNP 862.2*     DDimer No results for input(s): DDIMER in the last 168 hours.   Radiology    No results found.  Cardiac Studies   Echocardiogram: 08/30/2016 Study Conclusions  - Left ventricle: The cavity size was normal. Wall thickness was increased in a pattern of mild LVH. Severe hypokinesis of the mid anteroseptal wall, apical septal wall, true apex, mid to apical anterior wall. Indeterminant diastolic function (atrial fibrillation). The estimated ejection fraction was 45%. - Aortic valve: There was no stenosis. - Mitral valve: Mildly to moderately calcified annulus. There was trivial regurgitation. - Left atrium: The atrium was mildly to moderately dilated. - Right ventricle: The cavity  size was normal. Systolic function was normal. - Pulmonary arteries: No complete TR doppler jet so unable to estimate PA systolic pressure. - Systemic veins: IVC measured 2.3 cm with < 50% respirophasic variation, suggesting RA pressure 15 mmHg.  Impressions:  - Normal LV size with mild LV hypertrophy. EF 45% with wall motion abnormalities as described above, in LAD coronary distribution. Normal RV size and systolic function. No significant valvular abnormalities.  Patient Profile     81 y.o. female w/ PMH of IDDM, persistent afib since 2015, morbid obesity, HTN, gout, and pancreatitis who presented to [facility] ED on the evening of 08/29/2016 for worsening shortness of breath and chest pain. She had evidence of acute CHF and elevated troponin. Echo shows reduced EF of 45%.  Assessment & Plan    1. Acute systolic heart failure -BNP 862.2 on admission. Echocardiogram on 08/30/16 showed reduced LV function with EF 45%, mild LVH, severe hypokinesis of the mid anteroseptal wall, apical septal wall, true apex, mid to apical anterior wall, indeterminant diastolic function. - diuresed with IV Lasix 40mg  daily, however creatinine has continued to rise (1.64-->1.58-->1.72-->1.87). Weight at 281 lbs on admission, 278 lbs today. Dry weight is about 275 pounds according to the patient. Net I&O -5.0L since admission.  - Lasix held after AM dose on 2/14 to allow kidney function to equilibrate prior to cardiac cath on Friday. - Continue Toprol-XL 50 mg daily along with Hydralazine and Imdur.  - Avoid ACE-I, ARB, and Aldactone due to acute on chronic RF.  2. Chest pain - Is typical in nature with DOE and chest burning/tightness with exertion, resolving with rest. Progressive over the last 2 months - EKG shows anterolateral TWI with possible old anterospeptal infarct. Troponin flat at 0.03, 0.05, and 0.05. - Wall motion abnormalities on echo and EKG suggestive of CAD  - Plan is for  cardiac cath tomorrow if renal function remains stable. Eliquis held and started on Heparin.   3. Acute renal insufficiency -Creatinine is elevated 1.64-->1.58-->1.72 --> 1.87. Baseline around 1.0 -Probably related to acute CHF.  - repeat BMET pending for this AM.   4. Persistent atrial fibrillation -Since at least 2015 (seen at [facility]). Rate controlled on Toprol-XL 50 mg daily. -This patients CHA2DS2-VASc Score is 5 (HTN, CHF, age (2), female). On Eliquis PTA. Held in anticipation of cardiac catheterization. Started on Heparin.   5. Hypertension - BP at 134/42 - 173/68 in the past 24 hours.  -Continue Amlodipine, Toprol-XL, Imdur, and Hydralazine. Will titrate Hydralazine from 10mg  TID to 25mg  TID with elevated readings.  6. Type 2 diabetes - Hgb A1c 10.3 - per admitting team  7. Hypercholesterolemia - lipid panel this admission shows total cholesterol 242, HDL 36, and LDL 180. - recommend statin therapy- will start Crestor 20 mg daily. Will check LFT's.     Arna Medici , PA-C 8:27 AM 09/02/2016 Pager: [phone] Patient seen and examined and history reviewed. Agree with above findings and plan. She is feeling Ok. Weight up one lb since lasix held. I/O negative 550 cc. Creatinine is higher today 1.91. Continuing to hold diuretics now. Will repeat BMET in am. If renal function stable or improved we will plan on hydrating in the am and proceed with cardiac cath as planned. If renal function worse we will consult nephrology. BP is still high so will increase hydralazine further.  Peter Martinique, [place] 09/02/2016 3:34 PM

## 2016-09-02 NOTE — Care Management Important Message (Signed)
Important Message  Patient Details  Name: Penni Penado MRN: [id number] Date of Birth: 1935/09/18   Medicare Important Message Given:  Yes    Meilani Edmundson 09/02/2016, 11:06 AM

## 2016-09-02 NOTE — Progress Notes (Signed)
Family Medicine Teaching Service Daily Progress Note Intern Pager: [phone]  Patient name: Kristina Howell Medical record number: [id number] Date of birth: Nov 23, 1935 Age: 81 y.o. Gender: female  Primary Care Provider: Tawni Carnes, PA-C Consultants: None Code Status: Full (confirmed this admission)  Pt Overview and Major Events to Date:  08/29/2016: Admit to FPTS  Assessment and Plan: Kristina Howell is a 81 y.o. female presenting with worsening dyspnea. PMH is significant for T2DM on insulin, persistent afib, morbid obesity, HTN, gout, and pancreatitis.    #Worsening Dyspnea, new CHF diagnosis, stable: Still hypervolemic. EKG with afib (chronic) and possible old anterolateral infarct.   Strict I/Os: net -5.0L since admit ~270lbs. - Daily wts 280 lbs >>278 - Cards Recs: continue to diurese (but hold today for cath friday), added imdur  - NPO at midnight  #T2DM: Takes lantus 20 U QHS. - CBGs qACHS - Lantus 16u nightly - dec to 8u tonight for NPO - moderate sliding scale - Q4H CBGs while NPO after midnight  #Diarrhea, resolved - non-bloody, two episodes,  may be caused by potassium given this admission - monitor, no recent abx use, likely not c diff - resolved  #HTN, improved - hold benazepril, consider switching to losartan at DC - cont norvasc 10 mg while holding ACE for AKI  #AKI: SCr 1.64 > >> >1.87  Baseline may be ~ 1.0 (2015). Suspect prerenal in setting of CHF exacerbation as BUN:Cr 21.  - Hold home ACE until SCr improves  #Hypercholesterolemia: LDL 180 - Crestor 20 mg daily started per cards  #R foot ulcer: Callused, does not appear infected.  - dead skin edges of the callus removed, ulcer present below, does not seem to palpate to bone - Consult WOC while inpatient  #Persistent afib: On eliquis 5 mg daily. Used to be on BID dosing but caused too much pinpoint bleeding per patient. Rate controlled on metoprolol.  - cont heparin gtt for cath tomorrow - cont home  toprol 50 mg  #Hx of gout: - Continue home allopurinol  FEN/GI: Heart healthy/carb modified Prophylaxis: On eliquis  Disposition: will follow PT recs, maybe home  Subjective:  No acute events overnight. Patient endorses continued shortness of breath on exertion. Notes two loose stools but no diarrhea.  Objective: Temp:  [97.5 F (36.4 C)-98 F (36.7 C)] 97.5 F (36.4 C) (02/15 1231) Pulse Rate:  [73-76] 76 (02/15 1231) Resp:  [18-20] 20 (02/15 1231) BP: (134-173)/(42-76) 165/76 (02/15 1231) SpO2:  [98 %-99 %] 98 % (02/15 1231) Weight:  [278 lb 3.2 oz (126.2 kg)] 278 lb 3.2 oz (126.2 kg) (02/15 5732) Physical Exam: General: NAD, rests comfortably with breakfast tray Cardiovascular: RRR, no m/r/g Respiratory: CTA bil, diminished sounds at the lung bases, no W/R/R Abdomen: obese, soft, nontender, nondistended Extremities: 1-2+ pitting edema, dependent, now in upper thighs Derm: chronic hyperpigmentation venous changes over lower leg. Callused region to R plantar eminence below great toe about 2cmx2.5cm.   Laboratory: Pertinent Labs BNP 862.2.  hgb A1c 13.1   Recent Labs Lab 08/31/16 0537 09/01/16 1336 09/02/16 0958  WBC 8.2 10.1 7.4  HGB 13.1 13.2 12.7  HCT 40.5 41.1 39.7  PLT 250 253 237    Recent Labs Lab 08/31/16 0537 09/01/16 0353 09/02/16 0958  NA 139 136 139  K 3.6 3.8 4.1  CL 103 101 102  CO2 25 25 28   BUN 32* 38* 39*  CREATININE 1.72* 1.87* 1.91*  CALCIUM 9.1 8.8* 8.9  GLUCOSE 216* 271* 230*   Pertinent labs  this admission Lipid panel Chol 242 Tri 130 HDL 36 LDL 180 Troponin 0.03 >0.05>0.05  Imaging/Diagnostic Tests: Dg Chest 2 View: 08/29/2016 FINDINGS: Cardiac shadow is at the upper limits of normal in size. Mild vascular congestion is seen. Small bilateral pleural effusions and bibasilar atelectatic changes are seen. No bony abnormality is noted. IMPRESSION: Mild CHF with bibasilar atelectatic changes and effusions.   Everrett Coombe,  MD 09/02/2016, 1:00 PM PGY-1, [place] Intern pager: [phone], text pages welcome

## 2016-09-02 NOTE — Progress Notes (Signed)
ANTICOAGULATION CONSULT NOTE - Follow Up Consult  Pharmacy Consult for Heparin Indication: atrial fibrillation + ACS  No Known Allergies  Patient Measurements: Height: 5\' 6"  (167.6 cm) Weight: 278 lb 3.2 oz (126.2 kg) (Scale B) IBW/kg (Calculated) : 59.3 Heparin Dosing Weight: 90.2 kg  Vital Signs: Temp: 98 F (36.7 C) (02/15 0632) Temp Source: Oral (02/15 0632) BP: 173/66 (02/15 5974) Pulse Rate: 75 (02/15 0632)  Labs:  Recent Labs  08/31/16 0537  09/01/16 0353 09/01/16 1336 09/01/16 2050 09/02/16 0440 09/02/16 0757  HGB 13.1  --   --  13.2  --   --   --   HCT 40.5  --   --  41.1  --   --   --   PLT 250  --   --  253  --   --   --   APTT  --   < > 48* 114* 61*  --  139*  HEPARINUNFRC  --   --  1.05*  --  0.70 0.99*  --   CREATININE 1.72*  --  1.87*  --   --   --   --   < > = values in this interval not displayed.  Estimated Creatinine Clearance: 33.7 mL/min (by C-G formula based on SCr of 1.87 mg/dL (H)).   Assessment: daily - she just decided to do this a few months ago). Eliquis>>IV heparin on 2/13 for planned cath. CHADS2VASC 5 - aPTT 0.99, aPTT 139. CBC WNL  Goal of Therapy:  Heparin level 0.3-0.7 units/ml  APTT 66-102 Monitor platelets by anticoagulation protocol: Yes   Plan:  Decrease IV heparin to 1450 units/hr Recheck HL and aPTT in 6 hrs. Daily HL and CBC Will need CIN prophylaxis for cath.  Madeeha Costantino S. Alford Highland, PharmD, BCPS Clinical Staff Pharmacist Pager [phone]  Eilene Ghazi Stillinger 09/02/2016,9:26 AM

## 2016-09-03 ENCOUNTER — Encounter (HOSPITAL_COMMUNITY): Payer: Self-pay | Admitting: Internal Medicine

## 2016-09-03 ENCOUNTER — Encounter (HOSPITAL_COMMUNITY)
Admission: EM | Disposition: A | Discharge status: Home Health | Payer: Self-pay | Source: Home / Self Care | Attending: Family Medicine

## 2016-09-03 ENCOUNTER — Other Ambulatory Visit: Payer: Self-pay | Admitting: *Deleted

## 2016-09-03 ENCOUNTER — Ambulatory Visit (HOSPITAL_COMMUNITY): Admission: RE | Admit: 2016-09-03 | Payer: PPO | Source: Ambulatory Visit | Admitting: Internal Medicine

## 2016-09-03 DIAGNOSIS — I2583 Coronary atherosclerosis due to lipid rich plaque: Secondary | ICD-10-CM

## 2016-09-03 DIAGNOSIS — I2510 Atherosclerotic heart disease of native coronary artery without angina pectoris: Secondary | ICD-10-CM

## 2016-09-03 DIAGNOSIS — I25110 Atherosclerotic heart disease of native coronary artery with unstable angina pectoris: Principal | ICD-10-CM

## 2016-09-03 HISTORY — PX: LEFT HEART CATH AND CORONARY ANGIOGRAPHY: CATH118249

## 2016-09-03 LAB — CBC
HCT: 37.1 % (ref 36.0–46.0)
Hemoglobin: 11.7 g/dL — ABNORMAL LOW (ref 12.0–15.0)
MCH: 27.7 pg (ref 26.0–34.0)
MCHC: 31.5 g/dL (ref 30.0–36.0)
MCV: 87.7 fL (ref 78.0–100.0)
PLATELETS: 257 10*3/uL (ref 150–400)
RBC: 4.23 MIL/uL (ref 3.87–5.11)
RDW: 14.9 % (ref 11.5–15.5)
WBC: 8.8 10*3/uL (ref 4.0–10.5)

## 2016-09-03 LAB — BASIC METABOLIC PANEL
Anion gap: 10 (ref 5–15)
Anion gap: 11 (ref 5–15)
BUN: 42 mg/dL — ABNORMAL HIGH (ref 6–20)
BUN: 40 mg/dL — AB (ref 6–20)
CALCIUM: 8.6 mg/dL — AB (ref 8.9–10.3)
CHLORIDE: 102 mmol/L (ref 101–111)
CO2: 21 mmol/L — ABNORMAL LOW (ref 22–32)
CO2: 23 mmol/L (ref 22–32)
CREATININE: 1.82 mg/dL — AB (ref 0.44–1.00)
Calcium: 8.8 mg/dL — ABNORMAL LOW (ref 8.9–10.3)
Chloride: 103 mmol/L (ref 101–111)
Creatinine, Ser: 1.78 mg/dL — ABNORMAL HIGH (ref 0.44–1.00)
GFR calc Af Amer: 30 mL/min — ABNORMAL LOW (ref >60)
GFR calc non Af Amer: 25 mL/min — ABNORMAL LOW (ref >60)
GFR, EST AFRICAN AMERICAN: 30 mL/min — AB (ref >60)
GFR, EST NON AFRICAN AMERICAN: 26 mL/min — AB (ref >60)
Glucose, Bld: 262 mg/dL — ABNORMAL HIGH (ref 65–99)
Glucose, Bld: 249 mg/dL — ABNORMAL HIGH (ref 65–99)
POTASSIUM: 4 mmol/L (ref 3.5–5.1)
Potassium: 3.8 mmol/L (ref 3.5–5.1)
SODIUM: 136 mmol/L (ref 135–145)
Sodium: 134 mmol/L — ABNORMAL LOW (ref 135–145)

## 2016-09-03 LAB — APTT
APTT: 87 s — AB (ref 24–36)
aPTT: 119 seconds — ABNORMAL HIGH (ref 24–36)

## 2016-09-03 LAB — GLUCOSE, CAPILLARY
GLUCOSE-CAPILLARY: 225 mg/dL — AB (ref 65–99)
Glucose-Capillary: 242 mg/dL — ABNORMAL HIGH (ref 65–99)
Glucose-Capillary: 169 mg/dL — ABNORMAL HIGH (ref 65–99)
Glucose-Capillary: 256 mg/dL — ABNORMAL HIGH (ref 65–99)
Glucose-Capillary: 227 mg/dL — ABNORMAL HIGH (ref 65–99)

## 2016-09-03 LAB — HEPATIC FUNCTION PANEL
ALT: 25 U/L (ref 14–54)
AST: 24 U/L (ref 15–41)
Albumin: 2.5 g/dL — ABNORMAL LOW (ref 3.5–5.0)
Alkaline Phosphatase: 79 U/L (ref 38–126)
Total Bilirubin: 0.4 mg/dL (ref 0.3–1.2)
Total Protein: 5.5 g/dL — ABNORMAL LOW (ref 6.5–8.1)

## 2016-09-03 LAB — PROTIME-INR
INR: 1.06
Prothrombin Time: 13.8 seconds (ref 11.4–15.2)

## 2016-09-03 LAB — HEPARIN LEVEL (UNFRACTIONATED): HEPARIN UNFRACTIONATED: 0.74 [IU]/mL — AB (ref 0.30–0.70)

## 2016-09-03 SURGERY — LEFT HEART CATH AND CORONARY ANGIOGRAPHY
Anesthesia: LOCAL

## 2016-09-03 MED ORDER — SODIUM CHLORIDE 0.9 % IV SOLN
INTRAVENOUS | Status: DC
  Administered 2016-09-03: 08:00:00 via INTRAVENOUS

## 2016-09-03 MED ORDER — LIDOCAINE HCL (PF) 1 % IJ SOLN
INTRAMUSCULAR | Status: DC | PRN
  Administered 2016-09-03: 2 mL

## 2016-09-03 MED ORDER — INSULIN ASPART 100 UNIT/ML ~~LOC~~ SOLN
0.0000 [IU] | Freq: Three times a day (TID) | SUBCUTANEOUS | Status: DC
  Administered 2016-09-03: 8 [IU] via SUBCUTANEOUS
  Administered 2016-09-03: 5 [IU] via SUBCUTANEOUS
  Administered 2016-09-03: 3 [IU] via SUBCUTANEOUS
  Administered 2016-09-04: 5 [IU] via SUBCUTANEOUS
  Administered 2016-09-04: 3 [IU] via SUBCUTANEOUS
  Administered 2016-09-04 – 2016-09-05 (×3): 8 [IU] via SUBCUTANEOUS
  Administered 2016-09-05: 3 [IU] via SUBCUTANEOUS

## 2016-09-03 MED ORDER — HEPARIN SODIUM (PORCINE) 1000 UNIT/ML IJ SOLN
INTRAMUSCULAR | Status: DC | PRN
Start: 2016-09-03 — End: 2016-09-03
  Administered 2016-09-03: 5000 [IU] via INTRAVENOUS

## 2016-09-03 MED ORDER — SODIUM CHLORIDE 0.9 % IV SOLN: 250.0000 mL | INTRAVENOUS | Status: DC | PRN

## 2016-09-03 MED ORDER — VERAPAMIL HCL 2.5 MG/ML IV SOLN
INTRAVENOUS | Status: DC | PRN
  Administered 2016-09-03: 10 mL via INTRA_ARTERIAL

## 2016-09-03 MED ORDER — FUROSEMIDE 10 MG/ML IJ SOLN
80.0000 mg | Freq: Two times a day (BID) | INTRAMUSCULAR | Status: DC
  Administered 2016-09-03 – 2016-09-05 (×5): 80 mg via INTRAVENOUS
  Filled 2016-09-03 (×5): qty 8

## 2016-09-03 MED ORDER — SODIUM CHLORIDE 0.9% FLUSH: 3.0000 mL | INTRAVENOUS | Status: DC | PRN

## 2016-09-03 MED ORDER — SODIUM CHLORIDE 0.9% FLUSH
3.0000 mL | Freq: Two times a day (BID) | INTRAVENOUS | Status: DC
  Administered 2016-09-04 – 2016-09-06 (×3): 3 mL via INTRAVENOUS

## 2016-09-03 MED ORDER — HEPARIN (PORCINE) IN NACL 100-0.45 UNIT/ML-% IJ SOLN
1450.0000 [IU]/h | INTRAMUSCULAR | Status: DC
  Administered 2016-09-03: 1150 [IU]/h via INTRAVENOUS
  Administered 2016-09-04 – 2016-09-06 (×3): 1450 [IU]/h via INTRAVENOUS
  Filled 2016-09-03 (×6): qty 250

## 2016-09-03 MED ORDER — IOPAMIDOL (ISOVUE-370) INJECTION 76%
INTRAVENOUS | Status: DC | PRN
Start: 2016-09-03 — End: 2016-09-03
  Administered 2016-09-03: 30 mL via INTRA_ARTERIAL

## 2016-09-03 SURGICAL SUPPLY — 11 items
CATH IMPULSE 5F ANG/FL3.5 (CATHETERS) ×2 IMPLANT
DEVICE RAD COMP TR BAND LRG (VASCULAR PRODUCTS) ×2 IMPLANT
GLIDESHEATH SLEND SS 6F .021 (SHEATH) ×2 IMPLANT
GUIDEWIRE INQWIRE 1.5J.035X260 (WIRE) ×1 IMPLANT
INQWIRE 1.5J .035X260CM (WIRE) ×2
KIT HEART LEFT (KITS) ×2 IMPLANT
PACK CARDIAC CATHETERIZATION (CUSTOM PROCEDURE TRAY) ×2 IMPLANT
SET INTRODUCER MICROPUNCT 5F (INTRODUCER) ×2 IMPLANT
TRANSDUCER W/STOPCOCK (MISCELLANEOUS) ×2 IMPLANT
TUBING CIL FLEX 10 FLL-RA (TUBING) ×2 IMPLANT
WIRE HI TORQ VERSACORE-J 145CM (WIRE) ×2 IMPLANT

## 2016-09-03 NOTE — Progress Notes (Signed)
Small hematoma to rt radial wrist PA Rosita Fire made aware came up to see patient. No concerns and will cont to monitor.

## 2016-09-03 NOTE — Progress Notes (Signed)
ANTICOAGULATION CONSULT NOTE - Follow Up Consult  Pharmacy Consult for Heparin (apixaban on hold) Indication: chest pain/ACS and atrial fibrillation  No Known Allergies  Patient Measurements: Height: 5\' 6"  (167.6 cm) Weight: 278 lb 3.2 oz (126.2 kg) (Scale B) IBW/kg (Calculated) : 59.3  Vital Signs: Temp: 98.3 F (36.8 C) (02/15 2014) Temp Source: Oral (02/15 2014) BP: 134/72 (02/15 2014) Pulse Rate: 75 (02/15 2014)  Labs:  Recent Labs  08/31/16 0537  09/01/16 0353 09/01/16 1336 09/01/16 2050 09/02/16 0440 09/02/16 0757 09/02/16 0958 09/02/16 1525 09/03/16 0017 09/03/16 0031  HGB 13.1  --   --  13.2  --   --   --  12.7  --   --  11.7*  HCT 40.5  --   --  41.1  --   --   --  39.7  --   --  37.1  PLT 250  --   --  253  --   --   --  237  --   --  257  APTT  --   < > 48* 114* 61*  --  139*  --  122* 119*  --   LABPROT  --   --   --   --   --   --   --   --   --   --  13.8  INR  --   --   --   --   --   --   --   --   --   --  1.06  HEPARINUNFRC  --   < > 1.05*  --  0.70 0.99*  --   --  0.90*  --   --   CREATININE 1.72*  --  1.87*  --   --   --   --  1.91*  --   --   --   < > = values in this interval not displayed.  Estimated Creatinine Clearance: 33 mL/min (by C-G formula based on SCr of 1.91 mg/dL (H)).  Assessment: Heparin while apixaban on hold in anticipation of cath, aPTT remains elevated despite rate decrease, using aPTT to dose for now given apixaban influence on anti-Xa levels. No issues per RN.  Goal of Therapy:  Heparin level 0.3-0.7 units/ml aPTT 66-102 seconds Monitor platelets by anticoagulation protocol: Yes   Plan:  -Dec heparin to 1150 units/hr -1000 aPTT/HL  Narda Bonds 09/03/2016,1:12 AM

## 2016-09-03 NOTE — Interval H&P Note (Signed)
History and Physical Interval Note:  09/03/2016 10:44 AM  Kristina Howell  has presented today for cardiac catheterization, with the diagnosis of unstable angina.  The various methods of treatment have been discussed with the patient and family. After consideration of risks, benefits and other options for treatment, the patient has consented to  Procedure(s): Left Heart Cath and Coronary Angiography (N/A) as a surgical intervention .  The patient's history has been reviewed, patient examined, no change in status, stable for surgery.  I have reviewed the patient's chart and labs.  Questions were answered to the patient's satisfaction.    Cath Lab Visit (complete for each Cath Lab visit)  Clinical Evaluation Leading to the Procedure:   ACS: Yes.    Non-ACS:    Anginal Classification: CCS IV  Anti-ischemic medical therapy: Minimal Therapy (1 class of medications)  Non-Invasive Test Results: No non-invasive testing performed (Echo with moderately reduced LV function and anterior wall motion abnormality).  Prior CABG: No previous CABG  Bunny Kleist

## 2016-09-03 NOTE — Progress Notes (Signed)
Family Medicine Teaching Service Daily Progress Note Intern Pager: [phone]  Patient name: Kristina Howell         Medical record number: [id number] Date of birth: 24-Oct-1935          Age: 81 y.o.    Gender: female  Primary Care Provider: Tawni Carnes, PA-C Consultants: None Code Status: Full (confirmed this admission)  Pt Overview and Major Events to Date:  08/29/2016: Admit to FPTS  Assessment and Plan: Kristina Howell a 81 y.o.femalepresenting with worsening dyspnea. PMH is significant for T2DM on insulin, persistent afib, morbid obesity, HTN, gout, and pancreatitis.   #new CHF diagnosis, stable: Still hypervolemic. Strict I/Os: net -6.1L since admit ~270lbs. - Daily wts 280 lbs >>279 - Cards Recs: possible cath today - plan to restart diet after - likely restart diuresis 2/17 after cath (40 IV Lasix qd)  #T2DM: Takes lantus 20 U QHS at home. On 16u nightly here, reduced dose 8u 2/15 NPO. CBG 242,259 overnight. Required 13u short acting over last 24h. - CBGs qACHS - Lantus 16u nightly - moderate sliding scale - q4H CBG while NPO  #HTN, worse 169/63 this AM - hold benazepril, consider switching to losartan at DC - cont imdur, hydralazine 25 mg TID, metoprolol XL norvasc 10 mg (new this admit while holding ACE) - monitor  #AKI:SCr 1.64 >>1.78  Baseline may be ~ 1.0 (2015). Suspect prerenal in setting of CHF exacerbation.  - Hold home ACE until SCr improves  #Hypercholesterolemia: LDL 180 - cont Crestor 20 mg qd  #R foot ulcer: Callused, does not appear infected. - dead skin edges of the callus removed, ulcer present below, does not seem to palpate to bone - Consult WOC while inpatient  #Persistent afib: On eliquis 5 mg daily at home. Rate controlled on metoprolol.  - cont heparin gtt for cath tomorrow - cont home toprol 50 mg  #Hx of gout: - Continue home allopurinol  FEN/GI: Heart healthy/carb modified Prophylaxis: On eliquis  Disposition:  will follow PT recs, maybe home  Subjective:  No acute events overnight. She rests comfortably this morning with no complaints.  Objective: Temp:  [97.5 F (36.4 C)-98.3 F (36.8 C)] 97.9 F (36.6 C) (02/16 0437) Pulse Rate:  [75-78] 78 (02/16 0437) Resp:  [18-20] 20 (02/16 0437) BP: (134-169)/(63-76) 169/63 (02/16 0437) SpO2:  [97 %-98 %] 97 % (02/16 0437) Weight:  [279 lb 14.4 oz (127 kg)] 279 lb 14.4 oz (127 kg) (02/16 0437) Physical Exam: General: NAD, rests comfortably in bed, pleasant Cardiovascular: RRR, no m/r/g Respiratory: CTA bil, diminished sounds at the lung bases, no W/R/R Abdomen: obese, soft, nontender, nondistended Extremities: 1-2+ pitting edema up to knees Derm: chronic hyperpigmentation venous changes over lower leg. Callused region to R plantar eminence below great toe about 2cmx2.5cm.   Laboratory: Pertinent Labs BNP 862.2.  hgb A1c 13.1   LastLabs   Recent Labs Lab 09/01/16 1336 09/02/16 0958 09/03/16 0031  WBC 10.1 7.4 8.8  HGB 13.2 12.7 11.7*  HCT 41.1 39.7 37.1  PLT 253 237 257      LastLabs   Recent Labs Lab 09/02/16 0958 09/03/16 0031 09/03/16 0430  NA 139 136 134*  K 4.1 3.8 4.0  CL 102 103 102  CO2 28 23 21*  BUN 39* 42* 40*  CREATININE 1.91* 1.82* 1.78*  CALCIUM 8.9 8.6* 8.8*  PROT  --  5.5*  --   BILITOT  --  0.4  --   ALKPHOS  --  79  --  ALT  --  25  --   AST  --  24  --   GLUCOSE 230* 262* 249*     Pertinent labs this admission Lipid panel Chol 242 Tri 130 HDL 36 LDL 180 Troponin 0.03 >0.05>0.05  Imaging/Diagnostic Tests: Dg Chest 2 View: 08/29/2016 FINDINGS: Cardiac shadow is at the upper limits of normal in size. Mild vascular congestion is seen. Small bilateral pleural effusions and bibasilar atelectatic changes are seen. No bony abnormality is noted. IMPRESSION: Mild CHF with bibasilar atelectatic changes and effusions.   Kristina Coombe, MD 09/03/2016, 7:32 AM PGY-1, [place] Intern pager: [phone], text pages welcome

## 2016-09-03 NOTE — Progress Notes (Signed)
Progress Note  Patient Name: Kristina Howell Date of Encounter: 09/03/2016  Primary Cardiologist: Dr. Martinique  Subjective   Still with orthopnea and lower extremity edema. No chest discomfort or palpitations overnight or this morning.   Inpatient Medications    Scheduled Meds: . allopurinol  300 mg Oral Daily  . amLODipine  10 mg Oral Daily  . hydrALAZINE  25 mg Oral Q8H  . insulin aspart  0-15 Units Subcutaneous TID WC  . insulin glargine  16 Units Subcutaneous QHS  . isosorbide mononitrate  30 mg Oral Daily  . living well with diabetes book   Does not apply Once  . metoprolol succinate  50 mg Oral Daily  . rosuvastatin  20 mg Oral q1800  . silver sulfADIAZINE   Topical Daily  . sodium chloride flush  3 mL Intravenous Q12H  . sodium chloride flush  3 mL Intravenous Q12H   Continuous Infusions: . sodium chloride    . sodium chloride 75 mL/hr at 09/03/16 0753  . heparin Stopped (09/03/16 1019)   PRN Meds: sodium chloride, sodium chloride, acetaminophen, hydrALAZINE, ondansetron (ZOFRAN) IV, sodium chloride flush, sodium chloride flush   Vital Signs    Vitals:   09/02/16 2014 09/03/16 0437 09/03/16 0810 09/03/16 1010  BP: 134/72 (!) 169/63  113/69  Pulse: 75 78  87  Resp: 18 20    Temp: 98.3 F (36.8 C) 97.9 F (36.6 C)    TempSrc: Oral Oral    SpO2: 98% 97% 97%   Weight:  279 lb 14.4 oz (127 kg)    Height:        Intake/Output Summary (Last 24 hours) at 09/03/16 1023 Last data filed at 09/03/16 0900  Gross per 24 hour  Intake           852.85 ml  Output             1550 ml  Net          -697.15 ml   Filed Weights   09/01/16 0423 09/02/16 0632 09/03/16 0437  Weight: 277 lb 4.8 oz (125.8 kg) 278 lb 3.2 oz (126.2 kg) 279 lb 14.4 oz (127 kg)    Telemetry    Atrial fibrillation, HR in mid-50's to 60's.  - Personally Reviewed  ECG   No new tracings.   Physical Exam   General: Well developed, overweight Caucasian female appearing in no acute  distress. Head: Normocephalic, atraumatic.  Neck: Supple without bruits, JVD at 10cm. Lungs:  Resp regular and unlabored, decreased breath sounds at bases bilaterally. Heart: Irregularly irregular, S1, S2, no S3, S4, or murmur; no rub. Abdomen: Soft, non-tender, non-distended with normoactive bowel sounds. No hepatomegaly. No rebound/guarding. No obvious abdominal masses. Extremities: No clubbing or cyanosis. 2+ pitting edema up to mid-shins bilaterally. Distal pedal pulses are 2+ bilaterally. Neuro: Alert and oriented X 3. Moves all extremities spontaneously. Psych: Normal affect.  Labs    Chemistry  Recent Labs Lab 09/02/16 0958 09/03/16 0031 09/03/16 0430  NA 139 136 134*  K 4.1 3.8 4.0  CL 102 103 102  CO2 28 23 21*  GLUCOSE 230* 262* 249*  BUN 39* 42* 40*  CREATININE 1.91* 1.82* 1.78*  CALCIUM 8.9 8.6* 8.8*  PROT  --  5.5*  --   ALBUMIN  --  2.5*  --   AST  --  24  --   ALT  --  25  --   ALKPHOS  --  79  --  BILITOT  --  0.4  --   GFRNONAA 24* 25* 26*  GFRAA 28* 30* 30*  ANIONGAP 9 10 11      Hematology  Recent Labs Lab 09/01/16 1336 09/02/16 0958 09/03/16 0031  WBC 10.1 7.4 8.8  RBC 4.64 4.47 4.23  HGB 13.2 12.7 11.7*  HCT 41.1 39.7 37.1  MCV 88.6 88.8 87.7  MCH 28.4 28.4 27.7  MCHC 32.1 32.0 31.5  RDW 15.2 15.1 14.9  PLT 253 237 257    Cardiac Enzymes  Recent Labs Lab 08/30/16 0206 08/30/16 0806  TROPONINI 0.05* 0.05*     Recent Labs Lab 08/29/16 1939  TROPIPOC 0.03     BNP  Recent Labs Lab 08/29/16 1940  BNP 862.2*     DDimer No results for input(s): DDIMER in the last 168 hours.   Radiology    No results found.  Cardiac Studies   Echocardiogram: 08/30/2016 Study Conclusions  - Left ventricle: The cavity size was normal. Wall thickness was increased in a pattern of mild LVH. Severe hypokinesis of the mid anteroseptal wall, apical septal wall, true apex, mid to apical anterior wall. Indeterminant diastolic  function (atrial fibrillation). The estimated ejection fraction was 45%. - Aortic valve: There was no stenosis. - Mitral valve: Mildly to moderately calcified annulus. There was trivial regurgitation. - Left atrium: The atrium was mildly to moderately dilated. - Right ventricle: The cavity size was normal. Systolic function was normal. - Pulmonary arteries: No complete TR doppler jet so unable to estimate PA systolic pressure. - Systemic veins: IVC measured 2.3 cm with < 50% respirophasic variation, suggesting RA pressure 15 mmHg.  Impressions:  - Normal LV size with mild LV hypertrophy. EF 45% with wall motion abnormalities as described above, in LAD coronary distribution. Normal RV size and systolic function. No significant valvular abnormalities.  Patient Profile     81 y.o. female w/ PMH of IDDM, persistent afib since 2015, morbid obesity, HTN, gout, and pancreatitis who presented to [facility] ED on the evening of 08/29/2016 for worsening shortness of breath and chest pain. She had evidence of acute CHF and elevated troponin. Echo shows reduced EF of 45%.  Assessment & Plan    1. Acute systolic heart failure -BNP 862.2 on admission. Echocardiogram on 08/30/16 showed reduced LV function with EF 45%, mild LVH, severe hypokinesis of the mid anteroseptal wall, apical septal wall, true apex, mid to apical anterior wall, indeterminant diastolic function. - diuresed with IV Lasix 40mg  daily, held after [facility] 2/14. creatinine (1.64-->1.58-->1.72-->1.87> 1.92>>1.78). Weight at 281 lbs on admission, 279 lbs today. Dry weight is about 275 pounds according to the patient. Net I&O -6.0L since admission.  - Lasix held after AM dose on 2/14 to allow kidney function to equilibrate prior to cardiac cath today - Continue Toprol-XL 50 mg daily along with Hydralazine and Imdur.  - Avoid ACE-I, ARB, and Aldactone due to acute on chronic RF.  2. Chest pain - Is typical in nature with  DOE and chest burning/tightness with exertion, resolving with rest. Progressive over the last 2 months - EKG shows anterolateral TWI with possible old anterospeptal infarct. Troponin flat at 0.03, 0.05, and 0.05. - Wall motion abnormalities on echo and EKG suggestive of CAD  - Plan is for cardiac cath today. Will hydrate with IV saline this am. Eliquis held and started on Heparin.   3. Acute renal insufficiency -Creatinine is elevated 1.64-->1.58-->1.72 --> 1.87-->1.92-->1.78. Baseline around 1.0 -Probably related to acute CHF.  - diuretics  on hold for cardiac cath. She is still volume overloaded and may need additional diuresis post cath.   4. Persistent atrial fibrillation -Since at least 2015 (seen at [facility]). Rate controlled on Toprol-XL 50 mg daily. -This patients CHA2DS2-VASc Score is 5 (HTN, CHF, age (2), female). On Eliquis PTA. Held in anticipation of cardiac catheterization. Started on Heparin. Plan to resume Eliquis post cath.  5. Hypertension - BP at 113/69- 169/63 in the past 24 hours.  -Continue Amlodipine, Toprol-XL, Imdur, and Hydralazine.   6. Type 2 diabetes - Hgb A1c 10.3 - per admitting team  7. Hypercholesterolemia - lipid panel this admission shows total cholesterol 242, HDL 36, and LDL 180. - recommend statin therapy- will start Crestor 20 mg daily. Will check LFT's.     Signed,  Lilac Hoff [place], [place] 09/03/2016 10:23 AM

## 2016-09-03 NOTE — Patient Outreach (Signed)
[facility]) Care Management  09/03/2016  Masa Lubin [birth year] [id number]  Referral received from hospital liaison 09/01/16 to follow upon patient's discharge. Admission date 08/29/2016. Patient is currently inpatient status.   Plan; Will follow upon hospital discharge.   [name], RN BSN [place] Management Coordinator [facility] Care Management  [phone]

## 2016-09-03 NOTE — H&P (View-Only) (Signed)
Progress Note  Patient Name: Kristina Howell Date of Encounter: 09/03/2016  Primary Cardiologist: Dr. Martinique  Subjective   Still with orthopnea and lower extremity edema. No chest discomfort or palpitations overnight or this morning.   Inpatient Medications    Scheduled Meds: . allopurinol  300 mg Oral Daily  . amLODipine  10 mg Oral Daily  . hydrALAZINE  25 mg Oral Q8H  . insulin aspart  0-15 Units Subcutaneous TID WC  . insulin glargine  16 Units Subcutaneous QHS  . isosorbide mononitrate  30 mg Oral Daily  . living well with diabetes book   Does not apply Once  . metoprolol succinate  50 mg Oral Daily  . rosuvastatin  20 mg Oral q1800  . silver sulfADIAZINE   Topical Daily  . sodium chloride flush  3 mL Intravenous Q12H  . sodium chloride flush  3 mL Intravenous Q12H   Continuous Infusions: . sodium chloride    . sodium chloride 75 mL/hr at 09/03/16 0753  . heparin Stopped (09/03/16 1019)   PRN Meds: sodium chloride, sodium chloride, acetaminophen, hydrALAZINE, ondansetron (ZOFRAN) IV, sodium chloride flush, sodium chloride flush   Vital Signs    Vitals:   09/02/16 2014 09/03/16 0437 09/03/16 0810 09/03/16 1010  BP: 134/72 (!) 169/63  113/69  Pulse: 75 78  87  Resp: 18 20    Temp: 98.3 F (36.8 C) 97.9 F (36.6 C)    TempSrc: Oral Oral    SpO2: 98% 97% 97%   Weight:  279 lb 14.4 oz (127 kg)    Height:        Intake/Output Summary (Last 24 hours) at 09/03/16 1023 Last data filed at 09/03/16 0900  Gross per 24 hour  Intake           852.85 ml  Output             1550 ml  Net          -697.15 ml   Filed Weights   09/01/16 0423 09/02/16 0632 09/03/16 0437  Weight: 277 lb 4.8 oz (125.8 kg) 278 lb 3.2 oz (126.2 kg) 279 lb 14.4 oz (127 kg)    Telemetry    Atrial fibrillation, HR in mid-50's to 60's.  - Personally Reviewed  ECG   No new tracings.   Physical Exam   General: Well developed, overweight Caucasian female appearing in no acute  distress. Head: Normocephalic, atraumatic.  Neck: Supple without bruits, JVD at 10cm. Lungs:  Resp regular and unlabored, decreased breath sounds at bases bilaterally. Heart: Irregularly irregular, S1, S2, no S3, S4, or murmur; no rub. Abdomen: Soft, non-tender, non-distended with normoactive bowel sounds. No hepatomegaly. No rebound/guarding. No obvious abdominal masses. Extremities: No clubbing or cyanosis. 2+ pitting edema up to mid-shins bilaterally. Distal pedal pulses are 2+ bilaterally. Neuro: Alert and oriented X 3. Moves all extremities spontaneously. Psych: Normal affect.  Labs    Chemistry  Recent Labs Lab 09/02/16 0958 09/03/16 0031 09/03/16 0430  NA 139 136 134*  K 4.1 3.8 4.0  CL 102 103 102  CO2 28 23 21*  GLUCOSE 230* 262* 249*  BUN 39* 42* 40*  CREATININE 1.91* 1.82* 1.78*  CALCIUM 8.9 8.6* 8.8*  PROT  --  5.5*  --   ALBUMIN  --  2.5*  --   AST  --  24  --   ALT  --  25  --   ALKPHOS  --  79  --  BILITOT  --  0.4  --   GFRNONAA 24* 25* 26*  GFRAA 28* 30* 30*  ANIONGAP 9 10 11      Hematology  Recent Labs Lab 09/01/16 1336 09/02/16 0958 09/03/16 0031  WBC 10.1 7.4 8.8  RBC 4.64 4.47 4.23  HGB 13.2 12.7 11.7*  HCT 41.1 39.7 37.1  MCV 88.6 88.8 87.7  MCH 28.4 28.4 27.7  MCHC 32.1 32.0 31.5  RDW 15.2 15.1 14.9  PLT 253 237 257    Cardiac Enzymes  Recent Labs Lab 08/30/16 0206 08/30/16 0806  TROPONINI 0.05* 0.05*     Recent Labs Lab 08/29/16 1939  TROPIPOC 0.03     BNP  Recent Labs Lab 08/29/16 1940  BNP 862.2*     DDimer No results for input(s): DDIMER in the last 168 hours.   Radiology    No results found.  Cardiac Studies   Echocardiogram: 08/30/2016 Study Conclusions  - Left ventricle: The cavity size was normal. Wall thickness was increased in a pattern of mild LVH. Severe hypokinesis of the mid anteroseptal wall, apical septal wall, true apex, mid to apical anterior wall. Indeterminant diastolic  function (atrial fibrillation). The estimated ejection fraction was 45%. - Aortic valve: There was no stenosis. - Mitral valve: Mildly to moderately calcified annulus. There was trivial regurgitation. - Left atrium: The atrium was mildly to moderately dilated. - Right ventricle: The cavity size was normal. Systolic function was normal. - Pulmonary arteries: No complete TR doppler jet so unable to estimate PA systolic pressure. - Systemic veins: IVC measured 2.3 cm with < 50% respirophasic variation, suggesting RA pressure 15 mmHg.  Impressions:  - Normal LV size with mild LV hypertrophy. EF 45% with wall motion abnormalities as described above, in LAD coronary distribution. Normal RV size and systolic function. No significant valvular abnormalities.  Patient Profile     81 y.o. female w/ PMH of IDDM, persistent afib since 2015, morbid obesity, HTN, gout, and pancreatitis who presented to [facility] ED on the evening of 08/29/2016 for worsening shortness of breath and chest pain. She had evidence of acute CHF and elevated troponin. Echo shows reduced EF of 45%.  Assessment & Plan    1. Acute systolic heart failure -BNP 862.2 on admission. Echocardiogram on 08/30/16 showed reduced LV function with EF 45%, mild LVH, severe hypokinesis of the mid anteroseptal wall, apical septal wall, true apex, mid to apical anterior wall, indeterminant diastolic function. - diuresed with IV Lasix 40mg  daily, held after [facility] 2/14. creatinine (1.64-->1.58-->1.72-->1.87> 1.92>>1.78). Weight at 281 lbs on admission, 279 lbs today. Dry weight is about 275 pounds according to the patient. Net I&O -6.0L since admission.  - Lasix held after AM dose on 2/14 to allow kidney function to equilibrate prior to cardiac cath today - Continue Toprol-XL 50 mg daily along with Hydralazine and Imdur.  - Avoid ACE-I, ARB, and Aldactone due to acute on chronic RF.  2. Chest pain - Is typical in nature with  DOE and chest burning/tightness with exertion, resolving with rest. Progressive over the last 2 months - EKG shows anterolateral TWI with possible old anterospeptal infarct. Troponin flat at 0.03, 0.05, and 0.05. - Wall motion abnormalities on echo and EKG suggestive of CAD  - Plan is for cardiac cath today. Will hydrate with IV saline this am. Eliquis held and started on Heparin.   3. Acute renal insufficiency -Creatinine is elevated 1.64-->1.58-->1.72 --> 1.87-->1.92-->1.78. Baseline around 1.0 -Probably related to acute CHF.  - diuretics  on hold for cardiac cath. She is still volume overloaded and may need additional diuresis post cath.   4. Persistent atrial fibrillation -Since at least 2015 (seen at [facility]). Rate controlled on Toprol-XL 50 mg daily. -This patients CHA2DS2-VASc Score is 5 (HTN, CHF, age (2), female). On Eliquis PTA. Held in anticipation of cardiac catheterization. Started on Heparin. Plan to resume Eliquis post cath.  5. Hypertension - BP at 113/69- 169/63 in the past 24 hours.  -Continue Amlodipine, Toprol-XL, Imdur, and Hydralazine.   6. Type 2 diabetes - Hgb A1c 10.3 - per admitting team  7. Hypercholesterolemia - lipid panel this admission shows total cholesterol 242, HDL 36, and LDL 180. - recommend statin therapy- will start Crestor 20 mg daily. Will check LFT's.     Signed,  Jerrilynn Mikowski [place], [place] 09/03/2016 10:23 AM

## 2016-09-03 NOTE — Progress Notes (Signed)
ANTICOAGULATION CONSULT NOTE - Follow Up Consult  Pharmacy Consult for Heparin Indication: atrial fibrillation + ACS  No Known Allergies  Patient Measurements: Height: 5\' 6"  (167.6 cm) Weight: 279 lb 14.4 oz (127 kg) IBW/kg (Calculated) : 59.3 Heparin Dosing Weight: 90.2 kg  Vital Signs: Temp: 97.4 F (36.3 C) (02/16 1238) Temp Source: Oral (02/16 1238) BP: 151/60 (02/16 1238) Pulse Rate: 72 (02/16 1238)  Labs:  Recent Labs  09/01/16 1336  09/02/16 0440  09/02/16 0958 09/02/16 1525 09/03/16 0017 09/03/16 0031 09/03/16 0036 09/03/16 0430 09/03/16 1241  HGB 13.2  --   --   --  12.7  --   --  11.7*  --   --   --   HCT 41.1  --   --   --  39.7  --   --  37.1  --   --   --   PLT 253  --   --   --  237  --   --  257  --   --   --   APTT 114*  < >  --   < >  --  122* 119*  --   --   --  87*  LABPROT  --   --   --   --   --   --   --  13.8  --   --   --   INR  --   --   --   --   --   --   --  1.06  --   --   --   HEPARINUNFRC  --   < > 0.99*  --   --  0.90*  --   --  0.74*  --   --   CREATININE  --   --   --   --  1.91*  --   --  1.82*  --  1.78*  --   < > = values in this interval not displayed.  Estimated Creatinine Clearance: 35.5 mL/min (by C-G formula based on SCr of 1.78 mg/dL (H)).   Assessment: Heparin to re-start post-cath 4 hours after TR band removal. Patient was on Eliquis at home for atrial fibrillation and was transitioned to heparin in anticipation of cath. Cath completed today and TR band removed around 1400. Prior to cath, Eliquis was still affecting the heparin levels.  Patient received 1 dose of heparin 5000 units in cath.  CBC is stable and no bleeding noted post procedure.    Goal of Therapy:  Heparin level 0.3-0.7 units/ml aPTT 66-102 seconds  Monitor platelets by anticoagulation protocol: Yes   Plan:  Re-start heparin at 1150 units/hr 4 hours post TR band removal- 1800  0000 heparin level and aPTT  Monitor for signs/symptoms of bleeding    Uvaldo Bristle, PharmD PGY1 Pharmacy Resident  Pager: [phone] 09/03/2016,3:36 PM

## 2016-09-03 NOTE — Brief Op Note (Signed)
Brief Cardiac Catheterization Note (Full Report to Follow)  Date: 09/03/2016 Time: 11:47 AM  PATIENT:  Kristina Howell  81 y.o. female  PRE-OPERATIVE DIAGNOSIS:  unstable angina, acute systolic heart failure  POST-OPERATIVE DIAGNOSIS:  Multivessel coronary artery disease  PROCEDURE:  Procedure(s): Left Heart Cath and Coronary Angiography (N/A)  SURGEON:  Surgeon(s) and Role:    * Erminia Mcnew, MD - Primary  FINDINGS: 1. Multivessel CAD, including 90% mid LAD, 70% ramus, 30% OM, and 80% proximal/mid RCA lesions. 2. Mildly elevated left ventricular filling pressure. 3. Right radial artery stenosis (likely combination of fixed plaque and spasm) successfully navigated with Versacore wire.  RECOMMENDATIONS: 1. Medical optimization and aggressive diuresis. Lasix 80 mg IV BID ordered; may need to be titrated to achieve net negative fluid balance. 2. Restart heparin infusion 4 hours after TR band removal. 3. Plan for staged PCI to mid LAD and RCA next week once volume status optimized and renal function improved/stable.  Nelva Bush, MD [facility] Pager: [phone]

## 2016-09-04 DIAGNOSIS — I2510 Atherosclerotic heart disease of native coronary artery without angina pectoris: Secondary | ICD-10-CM

## 2016-09-04 DIAGNOSIS — I2583 Coronary atherosclerosis due to lipid rich plaque: Secondary | ICD-10-CM

## 2016-09-04 LAB — GLUCOSE, CAPILLARY
GLUCOSE-CAPILLARY: 186 mg/dL — AB (ref 65–99)
GLUCOSE-CAPILLARY: 230 mg/dL — AB (ref 65–99)
Glucose-Capillary: 251 mg/dL — ABNORMAL HIGH (ref 65–99)
Glucose-Capillary: 271 mg/dL — ABNORMAL HIGH (ref 65–99)

## 2016-09-04 LAB — BASIC METABOLIC PANEL
ANION GAP: 12 (ref 5–15)
BUN: 36 mg/dL — AB (ref 6–20)
CHLORIDE: 101 mmol/L (ref 101–111)
CO2: 24 mmol/L (ref 22–32)
Calcium: 8.9 mg/dL (ref 8.9–10.3)
Creatinine, Ser: 1.78 mg/dL — ABNORMAL HIGH (ref 0.44–1.00)
GFR calc Af Amer: 30 mL/min — ABNORMAL LOW (ref >60)
GFR, EST NON AFRICAN AMERICAN: 26 mL/min — AB (ref >60)
GLUCOSE: 207 mg/dL — AB (ref 65–99)
POTASSIUM: 3.9 mmol/L (ref 3.5–5.1)
Sodium: 137 mmol/L (ref 135–145)

## 2016-09-04 LAB — CBC
HCT: 37.9 % (ref 36.0–46.0)
HEMOGLOBIN: 11.9 g/dL — AB (ref 12.0–15.0)
MCH: 27.9 pg (ref 26.0–34.0)
MCHC: 31.4 g/dL (ref 30.0–36.0)
MCV: 88.8 fL (ref 78.0–100.0)
PLATELETS: 243 10*3/uL (ref 150–400)
RBC: 4.27 MIL/uL (ref 3.87–5.11)
RDW: 15.1 % (ref 11.5–15.5)
WBC: 7.1 10*3/uL (ref 4.0–10.5)

## 2016-09-04 LAB — APTT: aPTT: 49 seconds — ABNORMAL HIGH (ref 24–36)

## 2016-09-04 LAB — HEPARIN LEVEL (UNFRACTIONATED)
HEPARIN UNFRACTIONATED: 0.29 [IU]/mL — AB (ref 0.30–0.70)
Heparin Unfractionated: 0.19 IU/mL — ABNORMAL LOW (ref 0.30–0.70)
Heparin Unfractionated: 0.37 IU/mL (ref 0.30–0.70)

## 2016-09-04 MED ORDER — PNEUMOCOCCAL VAC POLYVALENT 25 MCG/0.5ML IJ INJ
0.5000 mL | INJECTION | INTRAMUSCULAR | Status: AC
  Administered 2016-09-05: 0.5 mL via INTRAMUSCULAR
  Filled 2016-09-04: qty 0.5

## 2016-09-04 NOTE — Progress Notes (Signed)
ANTICOAGULATION CONSULT NOTE - Follow Up Consult  Pharmacy Consult for Heparin (apixaban on hold) Indication: chest pain/ACS and atrial fibrillation, s/p cath  No Known Allergies  Patient Measurements: Height: 5\' 6"  (167.6 cm) Weight: 276 lb 11.2 oz (125.5 kg) IBW/kg (Calculated) : 59.3  HDW: 90.2kg  Vital Signs: Temp: 97.8 F (36.6 C) (02/17 1928) Temp Source: Oral (02/17 1928) BP: 150/56 (02/17 1928) Pulse Rate: 78 (02/17 1928)  Labs:  Recent Labs  09/02/16 0958  09/03/16 0017 09/03/16 0031  09/03/16 0430 09/03/16 1241 09/03/16 2329 09/04/16 0521 09/04/16 1051 09/04/16 2003  HGB 12.7  --   --  11.7*  --   --   --   --  11.9*  --   --   HCT 39.7  --   --  37.1  --   --   --   --  37.9  --   --   PLT 237  --   --  257  --   --   --   --  243  --   --   APTT  --   < > 119*  --   --   --  87* 49*  --   --   --   LABPROT  --   --   --  13.8  --   --   --   --   --   --   --   INR  --   --   --  1.06  --   --   --   --   --   --   --   HEPARINUNFRC  --   < >  --   --   < >  --   --  0.19*  --  0.29* 0.37  CREATININE 1.91*  --   --  1.82*  --  1.78*  --   --  1.78*  --   --   < > = values in this interval not displayed.  Estimated Creatinine Clearance: 35.3 mL/min (by C-G formula based on SCr of 1.78 mg/dL (H)).  Assessment: Heparin while apixaban on hold for cath which was done 2/16, planning for heparin and staged PCI next week, heparin level is now therapeutic   Goal of Therapy:  Heparin level 0.3-0.7 units/mL Monitor platelets by anticoagulation protocol: Yes   Plan:  Continue heparin at 1450 units / hr Follow up AM labs  Thank you Anette Guarneri, PharmD [phone] 09/04/2016 8:53 PM

## 2016-09-04 NOTE — Progress Notes (Signed)
ANTICOAGULATION CONSULT NOTE - Follow Up Consult  Pharmacy Consult for Heparin (apixaban on hold) Indication: chest pain/ACS and atrial fibrillation, s/p cath  No Known Allergies  Patient Measurements: Height: 5\' 6"  (167.6 cm) Weight: 276 lb 11.2 oz (125.5 kg) IBW/kg (Calculated) : 59.3  HDW: 90.2kg  Vital Signs: Temp: 97.8 F (36.6 C) (02/17 0432) Temp Source: Oral (02/17 0432) BP: 122/74 (02/17 0432) Pulse Rate: 71 (02/17 0432)  Labs:  Recent Labs  09/02/16 0958  09/03/16 0017 09/03/16 0031 09/03/16 0036 09/03/16 0430 09/03/16 1241 09/03/16 2329 09/04/16 0521 09/04/16 1051  HGB 12.7  --   --  11.7*  --   --   --   --  11.9*  --   HCT 39.7  --   --  37.1  --   --   --   --  37.9  --   PLT 237  --   --  257  --   --   --   --  243  --   APTT  --   < > 119*  --   --   --  87* 49*  --   --   LABPROT  --   --   --  13.8  --   --   --   --   --   --   INR  --   --   --  1.06  --   --   --   --   --   --   HEPARINUNFRC  --   < >  --   --  0.74*  --   --  0.19*  --  0.29*  CREATININE 1.91*  --   --  1.82*  --  1.78*  --   --  1.78*  --   < > = values in this interval not displayed.  Estimated Creatinine Clearance: 35.3 mL/min (by C-G formula based on SCr of 1.78 mg/dL (H)).  Assessment: Heparin while apixaban on hold for cath which was done 2/16, planning for heparin and staged PCI next week, heparin level is subtherapeutic after re-start s/p cath with most recent HL 0.29,   Goal of Therapy:  Heparin level 0.3-0.7 units/mL Monitor platelets by anticoagulation protocol: Yes   Plan:  -Inc heparin to 1450 units/hr, no bolus -8 hour HL -Daily CBC/HL, monitor S/Sx bleeding  Myer Peer Grayland Ormond), PharmD  PGY1 Pharmacy Resident Pager: [phone] 09/04/2016 11:59 AM

## 2016-09-04 NOTE — Progress Notes (Signed)
Subjective:  Complains of worsening shortness of breath earlier today.  Catheterization showed significant mid LAD stenosis as well as sequential RCA stenosis.  Objective:  Vital Signs in the last 24 hours: BP 122/74 (BP Location: Left Arm)   Pulse 71   Temp 97.8 F (36.6 C) (Oral)   Resp 18   Ht 5\' 6"  (1.676 m)   Wt 125.5 kg (276 lb 11.2 oz)   SpO2 95%   BMI 44.66 kg/m   Physical Exam: Significant obese white female currently in no acute distress sitting in side of bed Lungs:  Use breath sounds at base  Cardiac:  Regular rhythm, normal S1 and S2, no S3 Abdomen:  Soft, nontender, no masses Extremities: 1+ edema present, radial cath site clean and dry  Intake/Output from previous day: 02/16 0701 - 02/17 0700 In: 810.6 [P.O.:700; I.V.:110.6] Out: 2100 [Urine:2100]  Weight Filed Weights   09/02/16 0301 09/03/16 0437 09/04/16 0432  Weight: 126.2 kg (278 lb 3.2 oz) 127 kg (279 lb 14.4 oz) 125.5 kg (276 lb 11.2 oz)    Lab Results: Basic Metabolic Panel:  Recent Labs  09/03/16 0430 09/04/16 0521  NA 134* 137  K 4.0 3.9  CL 102 101  CO2 21* 24  GLUCOSE 249* 207*  BUN 40* 36*  CREATININE 1.78* 1.78*   CBC:  Recent Labs  09/03/16 0031 09/04/16 0521  WBC 8.8 7.1  HGB 11.7* 11.9*  HCT 37.1 37.9  MCV 87.7 88.8  PLT 257 243   Telemetry: Reviewed by me, sinus rhythm  Assessment/Plan:  1.  Acute systolic congestive heart failure 2.  CAD with LAD and right coronary artery disease 3.  Acute renal failure may be due to contrast 4.  Insulin-dependent diabetes mellitus  Recommendations:  Currently on heparin.  The plan is for her to have possible PCI next week.  Will need to have continued diuresis and we'll watch her renal function.      Kerry Hough  MD [facility]  09/04/2016, 11:33 AM

## 2016-09-04 NOTE — Progress Notes (Signed)
Patient had no complaints, visiting with family.

## 2016-09-04 NOTE — Progress Notes (Signed)
ANTICOAGULATION CONSULT NOTE - Follow Up Consult  Pharmacy Consult for Heparin (apixaban on hold) Indication: chest pain/ACS and atrial fibrillation, s/p cath  No Known Allergies  Patient Measurements: Height: 5\' 6"  (167.6 cm) Weight: 279 lb 14.4 oz (127 kg) IBW/kg (Calculated) : 59.3  Vital Signs: Temp: 97.5 F (36.4 C) (02/16 2028) Temp Source: Oral (02/16 2028) BP: 165/57 (02/16 2028) Pulse Rate: 63 (02/16 2028)  Labs:  Recent Labs  09/01/16 1336  09/02/16 0958 09/02/16 1525 09/03/16 0017 09/03/16 0031 09/03/16 0036 09/03/16 0430 09/03/16 1241 09/03/16 2329  HGB 13.2  --  12.7  --   --  11.7*  --   --   --   --   HCT 41.1  --  39.7  --   --  37.1  --   --   --   --   PLT 253  --  237  --   --  257  --   --   --   --   APTT 114*  < >  --  122* 119*  --   --   --  87* 49*  LABPROT  --   --   --   --   --  13.8  --   --   --   --   INR  --   --   --   --   --  1.06  --   --   --   --   HEPARINUNFRC  --   < >  --  0.90*  --   --  0.74*  --   --  0.19*  CREATININE  --   --  1.91*  --   --  1.82*  --  1.78*  --   --   < > = values in this interval not displayed.  Estimated Creatinine Clearance: 35.5 mL/min (by C-G formula based on SCr of 1.78 mg/dL (H)).  Assessment: Heparin while apixaban on hold for cath which was done 2/16, planning for heparin and staged PCI next week, heparin level is low after re-start s/p cath, we were using aPTT due to apixaban use but aPTT and heparin level appear to correlate so will use heparin level only now, no issues per RN.   Goal of Therapy:  Heparin level 0.3-0.7 units/mL Monitor platelets by anticoagulation protocol: Yes   Plan:  -Inc heparin to 1300 units/hr -0900 HL  Narda Bonds 09/04/2016,12:49 AM

## 2016-09-04 NOTE — Progress Notes (Signed)
Family Medicine Teaching Service Daily Progress Note Intern Pager: [phone]  Patient name: Kristina Howell         Medical record number: [id number] Date of birth: December 25, 1935          Age: 81 y.o.    Gender: female  Primary Care Provider: Tawni Carnes, PA-C Consultants: None Code Status: Full (confirmed this admission)  Pt Overview and Major Events to Date:  08/29/2016: Admit to [facility] 09/03/2016: Left Heart Cath -- Multivessel CAD, including 90% mid LAD stenosis involving small diagonal branch, 70% ostial/proximal ramus lesion, 30% OM stenosis and sequential 70-80% proximal/mid RCA stenoses  Assessment and Plan: Kristina Scottonis a 81 y.o.femalepresenting with worsening dyspnea. PMH is significant for T2DM on insulin, persistent afib, morbid obesity, HTN, gout, and pancreatitis.   #new CHF diagnosis, stable: Still hypervolemic. Strict I/Os: net -7.2 L since admit ~270lbs. - Daily wts 280 lbs >>277 - Cards Recs: plan for stage PCI to mid LAD and proximal/mid RCA next week once volume status and renal function improved - Restart diuresis 2/17 after cath (80 IV Lasix BID per cards)  #T2DM: Takes lantus 20 U QHS at home. On 16u nightly here. CBG 227,256 overnight. Required 16u short acting over last 24h. - CBGs qACHS - Lantus 16u nightly - moderate sliding scale  #HTN, improved 122/74 this AM - hold benazepril, consider switching to losartan at DC - cont imdur, hydralazine 25 mg TID, metoprolol XL 50 mg, norvasc 10 mg (new this admit while holding ACE) - monitor  #AKI:Stable. SCr 1.64 >>1.78  Baseline may be ~ 1.0 (2015). Suspect prerenal in setting of CHF exacerbation.  - Hold home ACE until SCr improves - continue to monitor   #Hypercholesterolemia: LDL 180 - cont Crestor 20 mg qd  #R foot ulcer: Callused, does not appear infected. - dead skin edges of the callus removed, ulcer present below, does not seem to palpate to bone - Consult WOC while  inpatient  #Persistent afib: On eliquis 5 mg daily at home. Rate controlled on metoprolol.  - cont heparin gtt in preparation for re-cath; resume eliquis afterwards - cont home toprol 50 mg  #Hx of gout: - Continue home allopurinol  FEN/GI: Heart healthy/carb modified Prophylaxis: On eliquis  Disposition: will follow PT recs, maybe home  Subjective:  No acute events overnight. She feels shortness of breath has been improved but a little worse yesterday when had to lay flat for procedure. Says she can tell she has a lot of fluid off but "has a ways to go."  Objective: Temp:  [97.5 F (36.4 C)-98.3 F (36.8 C)] 97.9 F (36.6 C) (02/16 0437) Pulse Rate:  [75-78] 78 (02/16 0437) Resp:  [18-20] 20 (02/16 0437) BP: (134-169)/(63-76) 169/63 (02/16 0437) SpO2:  [97 %-98 %] 97 % (02/16 0437) Weight:  [279 lb 14.4 oz (127 kg)] 279 lb 14.4 oz (127 kg) (02/16 0437) Physical Exam: General: NAD, sitting up in chair, eating breakfast Cardiovascular: RRR, no m/r/g Respiratory: CTA bil, diminished sounds at the lung bases, no W/R/R Abdomen: obese, soft, nontender, nondistended Extremities: 1-2+ pitting edema up to knees, worst across dorsum of feet Derm: chronic hyperpigmentation venous changes over lower leg. Callused region to R plantar eminence below great toe about 2cmx2.5cm.   Laboratory: Pertinent Labs BNP 862.2.  hgb A1c 13.1   LastLabs   Recent Labs Lab 09/01/16 1336 09/02/16 0958 09/03/16 0031  WBC 10.1 7.4 8.8  HGB 13.2 12.7 11.7*  HCT 41.1 39.7 37.1  PLT 253 237 257  LastLabs   Recent Labs Lab 09/02/16 0958 09/03/16 0031 09/03/16 0430  NA 139 136 134*  K 4.1 3.8 4.0  CL 102 103 102  CO2 28 23 21*  BUN 39* 42* 40*  CREATININE 1.91* 1.82* 1.78*  CALCIUM 8.9 8.6* 8.8*  PROT  --  5.5*  --   BILITOT  --  0.4  --   ALKPHOS  --  79  --   ALT  --  25  --   AST  --  24  --   GLUCOSE 230* 262* 249*     Pertinent labs this  admission Lipid panel Chol 242 Tri 130 HDL 36 LDL 180 Troponin 0.03 >0.05>0.05  Imaging/Diagnostic Tests: Dg Chest 2 View: 08/29/2016 FINDINGS: Cardiac shadow is at the upper limits of normal in size. Mild vascular congestion is seen. Small bilateral pleural effusions and bibasilar atelectatic changes are seen. No bony abnormality is noted. IMPRESSION: Mild CHF with bibasilar atelectatic changes and effusions.  Olene Floss, MD 09/04/2016, 9:37 AM PGY-2, [place] Intern pager: [phone], text pages welcome

## 2016-09-05 DIAGNOSIS — R0600 Dyspnea, unspecified: Secondary | ICD-10-CM

## 2016-09-05 DIAGNOSIS — I10 Essential (primary) hypertension: Secondary | ICD-10-CM

## 2016-09-05 DIAGNOSIS — I482 Chronic atrial fibrillation: Secondary | ICD-10-CM

## 2016-09-05 LAB — BASIC METABOLIC PANEL
ANION GAP: 9 (ref 5–15)
BUN: 40 mg/dL — ABNORMAL HIGH (ref 6–20)
CHLORIDE: 101 mmol/L (ref 101–111)
CO2: 25 mmol/L (ref 22–32)
Calcium: 9.1 mg/dL (ref 8.9–10.3)
Creatinine, Ser: 2.05 mg/dL — ABNORMAL HIGH (ref 0.44–1.00)
GFR calc non Af Amer: 22 mL/min — ABNORMAL LOW (ref >60)
GFR, EST AFRICAN AMERICAN: 26 mL/min — AB (ref >60)
Glucose, Bld: 211 mg/dL — ABNORMAL HIGH (ref 65–99)
POTASSIUM: 3.7 mmol/L (ref 3.5–5.1)
Sodium: 135 mmol/L (ref 135–145)

## 2016-09-05 LAB — CBC
HEMATOCRIT: 38 % (ref 36.0–46.0)
Hemoglobin: 12 g/dL (ref 12.0–15.0)
MCH: 28 pg (ref 26.0–34.0)
MCHC: 31.6 g/dL (ref 30.0–36.0)
MCV: 88.6 fL (ref 78.0–100.0)
Platelets: 271 10*3/uL (ref 150–400)
RBC: 4.29 MIL/uL (ref 3.87–5.11)
RDW: 15.1 % (ref 11.5–15.5)
WBC: 8.8 10*3/uL (ref 4.0–10.5)

## 2016-09-05 LAB — GLUCOSE, CAPILLARY
GLUCOSE-CAPILLARY: 272 mg/dL — AB (ref 65–99)
GLUCOSE-CAPILLARY: 278 mg/dL — AB (ref 65–99)
GLUCOSE-CAPILLARY: 287 mg/dL — AB (ref 65–99)
Glucose-Capillary: 196 mg/dL — ABNORMAL HIGH (ref 65–99)

## 2016-09-05 LAB — HEPARIN LEVEL (UNFRACTIONATED): Heparin Unfractionated: 0.4 IU/mL (ref 0.30–0.70)

## 2016-09-05 MED ORDER — FUROSEMIDE 10 MG/ML IJ SOLN
40.0000 mg | Freq: Two times a day (BID) | INTRAMUSCULAR | Status: DC
  Administered 2016-09-05 – 2016-09-06 (×2): 40 mg via INTRAVENOUS
  Filled 2016-09-05 (×2): qty 4

## 2016-09-05 MED ORDER — INSULIN GLARGINE 100 UNIT/ML ~~LOC~~ SOLN
18.0000 [IU] | Freq: Every day | SUBCUTANEOUS | Status: DC
  Administered 2016-09-05: 18 [IU] via SUBCUTANEOUS
  Filled 2016-09-05: qty 0.18

## 2016-09-05 NOTE — Progress Notes (Signed)
Progress Note  Patient Name: Kristina Howell Date of Encounter: 09/05/2016  Primary Cardiologist: [place]  Subjective   Denies chest pain. Sitting up in chair. Says breathing and leg swelling have improved since yesterday.  Inpatient Medications    Scheduled Meds: . allopurinol  300 mg Oral Daily  . amLODipine  10 mg Oral Daily  . furosemide  80 mg Intravenous BID  . hydrALAZINE  25 mg Oral Q8H  . insulin aspart  0-15 Units Subcutaneous TID WC  . insulin glargine  18 Units Subcutaneous QHS  . isosorbide mononitrate  30 mg Oral Daily  . living well with diabetes book   Does not apply Once  . metoprolol succinate  50 mg Oral Daily  . pneumococcal 23 valent vaccine  0.5 mL Intramuscular Tomorrow-1000  . rosuvastatin  20 mg Oral q1800  . silver sulfADIAZINE   Topical Daily  . sodium chloride flush  3 mL Intravenous Q12H  . sodium chloride flush  3 mL Intravenous Q12H   Continuous Infusions: . heparin 1,450 Units/hr (09/04/16 2329)   PRN Meds: sodium chloride, sodium chloride, acetaminophen, hydrALAZINE, ondansetron (ZOFRAN) IV, sodium chloride flush, sodium chloride flush   Vital Signs    Vitals:   09/04/16 1206 09/04/16 1928 09/05/16 0457 09/05/16 0940  BP: (!) 166/58 (!) 150/56 (!) 117/57 (!) 146/45  Pulse: 81 78 67 73  Resp: 18 20 18 18   Temp: 97.8 F (36.6 C) 97.8 F (36.6 C) 97.9 F (36.6 C) 97.7 F (36.5 C)  TempSrc: Oral Oral Oral Oral  SpO2: 96% 98% 99% 98%  Weight:   275 lb 12.8 oz (125.1 kg)   Height:        Intake/Output Summary (Last 24 hours) at 09/05/16 1011 Last data filed at 09/05/16 0900  Gross per 24 hour  Intake          1277.35 ml  Output             2000 ml  Net          -722.65 ml   Filed Weights   09/03/16 0437 09/04/16 0432 09/05/16 0457  Weight: 279 lb 14.4 oz (127 kg) 276 lb 11.2 oz (125.5 kg) 275 lb 12.8 oz (125.1 kg)    Telemetry    Rate controlled atrial fibrillation - Personally Reviewed  ECG    A Fib- Personally  Reviewed  Physical Exam   GEN: No acute distress. Sitting up in chair. Neck: No JVD Cardiac: Regular rate, irregular rhythm, normal S1/S2, no S3, 1/6 systolic murmur along RSB,no rubs or gallops.  Respiratory: Clear to auscultation bilaterally. GI: Soft, nontender, obese. MS: 1+ pitting b/l pretibial edema with erythema Neuro:  Nonfocal  Psych: Normal affect   Labs    Chemistry Recent Labs Lab 09/03/16 0031 09/03/16 0430 09/04/16 0521 09/05/16 0311  NA 136 134* 137 135  K 3.8 4.0 3.9 3.7  CL 103 102 101 101  CO2 23 21* 24 25  GLUCOSE 262* 249* 207* 211*  BUN 42* 40* 36* 40*  CREATININE 1.82* 1.78* 1.78* 2.05*  CALCIUM 8.6* 8.8* 8.9 9.1  PROT 5.5*  --   --   --   ALBUMIN 2.5*  --   --   --   AST 24  --   --   --   ALT 25  --   --   --   ALKPHOS 79  --   --   --   BILITOT 0.4  --   --   --  GFRNONAA 25* 26* 26* 22*  GFRAA 30* 30* 30* 26*  ANIONGAP 10 11 12 9      Hematology Recent Labs Lab 09/03/16 0031 09/04/16 0521 09/05/16 0311  WBC 8.8 7.1 8.8  RBC 4.23 4.27 4.29  HGB 11.7* 11.9* 12.0  HCT 37.1 37.9 38.0  MCV 87.7 88.8 88.6  MCH 27.7 27.9 28.0  MCHC 31.5 31.4 31.6  RDW 14.9 15.1 15.1  PLT 257 243 271    Cardiac Enzymes Recent Labs Lab 08/30/16 0206 08/30/16 0806  TROPONINI 0.05* 0.05*    Recent Labs Lab 08/29/16 1939  TROPIPOC 0.03     BNP Recent Labs Lab 08/29/16 1940  BNP 862.2*     DDimer No results for input(s): DDIMER in the last 168 hours.   Radiology    No results found.  Cardiac Studies   Cath (09/03/16):  1. Multivessel CAD, including 90% mid LAD stenosis involving small diagonal branch, 70% ostial/proximal ramus lesion, 30% OM stenosis and sequential 70-80% proximal/mid RCA stenoses. 2. Mildly elevated left ventricular filling pressure. 3. Mild to moderate aortic valve gradient, which is suboptimally evaluated due to heart rate variability related to atrial fibrillation. 4. Right radial artery stenosis, which is  likely a combination of fixed disease and vasospasm. Stenosis was successfully navigated with angled micropuncture and Versacore wires.  Recommendations: 1. Medical optimization, including further diuresis and close monitoring of renal function. 2. Plan for stage PCI to mid LAD and proximal/mid RCA next week when volume status has improved and renal function has improved/stabilized.  Patient Profile     81 y.o. female with severe CAD and ischemic cardiomyopathy admitted with dyspnea and leg swelling and found to be in acute systolic heart failure with severe 2-vessel CAD.  Assessment & Plan    1. Acute systolic heart failure: Symptomatic improvement but creatinine rising. Will reduce IV Lasix to 40 mg bid. Continue Toprol-XL, nitrates, and hydralazine. No ACEI/ARB due to acute renal failure.  2. CAD: Symptomatically stable. On IV heparin. Continue beta blocker, nitrates, and statin. Plan for staged PCI of LAD and RCA this week after HF optimization and renal dysfunction stabilization.  3. Atrial fibrillation: HR controlled. On heparin. On Eliquis at home. Awaiting cath.  4. Hypertension: BP mildly elevated. Monitor.  5. Acute renal failure: Possibly due to contrast-induced nephropathy combined with diuretic requirement. Will reduce Lasix to IV 40 mg bid from 80 mg bid. Creatinine elevated at 2.05 today (1.78 yesterday).  Signed, Kate Sable, MD  09/05/2016, 10:11 AM

## 2016-09-05 NOTE — Progress Notes (Signed)
Pt HR dropped to 38 non sustained then back to 50-60's, no s/s, MD notified, will continue to monitor, Thanks Arvella Nigh RN

## 2016-09-05 NOTE — Progress Notes (Signed)
Family Medicine Teaching Service Daily Progress Note Intern Pager: [phone]  Patient name: Kristina Howell Medical record number: [id number] Date of birth: 1936-02-20 Age: 81 y.o. Gender: female  Primary Care Provider: Jeri Modena Consultants: Cardiology Code Status: full  Pt Overview and Major Events to Date:  08/29/2016: Admit to [facility] 09/03/2016: Left Heart Cath -- Multivessel CAD, including 90% mid LAD stenosis involving small diagonal branch, 70% ostial/proximal ramus lesion, 30% OM stenosis and sequential 70-80% proximal/mid RCA stenoses  Assessment and Plan: Kristina Howell a 81 y.o.femalepresenting with worsening dyspnea. PMH is significant for T2DM on insulin, persistent afib, morbid obesity, HTN, gout, and pancreatitis.   #new CHF diagnosis, stable: Still hypervolemic. 900cc UOP and 2 uncalculated urine occurrences in last 24hrs. Daily wts 280 lbs >>275 (Dry weight 270) - Cards Recs: plan for stage PCI to mid LAD and proximal/mid RCA next week once volume status and renal function improved - Continue to diurese with 80 IV Lasix BID  #T2DM:  CBG 211-271 and required 16u SSI over last 24h. - CBGs qACHS - Increase Lantus to 18u nightly (home dose 20u)  - moderate sliding scale  #HTNWell controlled - hold benazepril, consider switching to losartan at DC for uric acid lowering properties - cont imdur, hydralazine 25 mg TID, metoprolol XL 50 mg, norvasc 10 mg (new this admit while holding ACE)  #AKI:Slight worsening. SCr 1.64 >>1.78 >> 2.05Baseline may be ~ 1.0 (2015). Related to CHF exacerbation and diuresis.  - Hold home ACE until SCr improves - continue to monitor   #Hypercholesterolemia: LDL 180 - cont Crestor 20 mg qd  #R foot ulcer: Callused, does not appear infected. - Consult WOC while inpatient  #Persistent afib: Rate controlled - cont heparin gtt in preparation for re-cath; resume home eliquis afterwards - cont home toprol 50 mg  #Hx of  gout:  Continue home allopurinol  FEN/GI: Heart healthy/carb modified, SLIV Prophylaxis: On eliquis  Disposition: will follow PT recs, maybe home, pending further diuresis and cardiac workup/PCI  Subjective:  Breathing is starting to improve.  Urinating well. Denies abd/chest pain.  Objective: Temp:  [97.8 F (36.6 C)-97.9 F (36.6 C)] 97.9 F (36.6 C) (02/18 0457) Pulse Rate:  [67-81] 67 (02/18 0457) Resp:  [18-20] 18 (02/18 0457) BP: (117-166)/(56-58) 117/57 (02/18 0457) SpO2:  [96 %-99 %] 99 % (02/18 0457) Weight:  [275 lb 12.8 oz (125.1 kg)] 275 lb 12.8 oz (125.1 kg) (02/18 0457) Physical Exam: General: NAD, sitting up in bed Cardiovascular: RRR, no m/r/g Respiratory: CTA bil, diminished sounds at the lung bases, no W/R/R Abdomen: obese, soft, nontender, nondistended Extremities: 2+ pitting edema up to knees, worst across dorsum of feet Derm: chronic hyperpigmentation venous changes over lower leg. Callused region to R plantar eminence below great toe about 2cmx2.5cm.   Laboratory:  Recent Labs Lab 09/03/16 0031 09/04/16 0521 09/05/16 0311  WBC 8.8 7.1 8.8  HGB 11.7* 11.9* 12.0  HCT 37.1 37.9 38.0  PLT 257 243 271    Recent Labs Lab 09/03/16 0031 09/03/16 0430 09/04/16 0521 09/05/16 0311  NA 136 134* 137 135  K 3.8 4.0 3.9 3.7  CL 103 102 101 101  CO2 23 21* 24 25  BUN 42* 40* 36* 40*  CREATININE 1.82* 1.78* 1.78* 2.05*  CALCIUM 8.6* 8.8* 8.9 9.1  PROT 5.5*  --   --   --   BILITOT 0.4  --   --   --   ALKPHOS 79  --   --   --  ALT 25  --   --   --   AST 24  --   --   --   GLUCOSE 262* 249* 207* 211*    Imaging/Diagnostic Tests: No results found.   Virginia Crews, MD 09/05/2016, 6:56 AM PGY-3, [place] Intern pager: [phone], text pages welcome

## 2016-09-05 NOTE — Progress Notes (Signed)
ANTICOAGULATION CONSULT NOTE - Follow Up Consult  Pharmacy Consult for Heparin (apixaban on hold) Indication: chest pain/ACS and atrial fibrillation, s/p cath  No Known Allergies  Patient Measurements: Height: 5\' 6"  (167.6 cm) Weight: 275 lb 12.8 oz (125.1 kg) (scale b) IBW/kg (Calculated) : 59.3  HDW: 90.2kg  Vital Signs: Temp: 97.9 F (36.6 C) (02/18 0457) Temp Source: Oral (02/18 0457) BP: 117/57 (02/18 0457) Pulse Rate: 67 (02/18 0457)  Labs:  Recent Labs  09/03/16 0017 09/03/16 0031  09/03/16 0430 09/03/16 1241 09/03/16 2329 09/04/16 0521 09/04/16 1051 09/04/16 2003 09/05/16 0311  HGB  --  11.7*  --   --   --   --  11.9*  --   --  12.0  HCT  --  37.1  --   --   --   --  37.9  --   --  38.0  PLT  --  257  --   --   --   --  243  --   --  271  APTT 119*  --   --   --  87* 49*  --   --   --   --   LABPROT  --  13.8  --   --   --   --   --   --   --   --   INR  --  1.06  --   --   --   --   --   --   --   --   HEPARINUNFRC  --   --   < >  --   --  0.19*  --  0.29* 0.37 0.40  CREATININE  --  1.82*  --  1.78*  --   --  1.78*  --   --  2.05*  < > = values in this interval not displayed.  Estimated Creatinine Clearance: 30.6 mL/min (by C-G formula based on SCr of 2.05 mg/dL (H)).  Assessment: Heparin while apixaban on hold for cath which was done 2/16, planning for heparin and staged PCI next week -CBC WNL and stable -HL 0.40, therapeutic -No signs of bleeding  Goal of Therapy:  Heparin level 0.3-0.7 units/mL Monitor platelets by anticoagulation protocol: Yes   Plan:  -Continue heparin at 1450 units/hr -Daily CBC/HL, monitor S/Sx bleeding  Myer Peer Grayland Ormond), PharmD  PGY1 Pharmacy Resident Pager: [phone] 09/05/2016 8:47 AM

## 2016-09-06 DIAGNOSIS — I5023 Acute on chronic systolic (congestive) heart failure: Secondary | ICD-10-CM

## 2016-09-06 DIAGNOSIS — N183 Chronic kidney disease, stage 3 (moderate): Secondary | ICD-10-CM

## 2016-09-06 LAB — BASIC METABOLIC PANEL
Anion gap: 12 (ref 5–15)
Anion gap: 12 (ref 5–15)
BUN: 37 mg/dL — AB (ref 6–20)
BUN: 39 mg/dL — AB (ref 6–20)
CALCIUM: 9.2 mg/dL (ref 8.9–10.3)
CALCIUM: 9.4 mg/dL (ref 8.9–10.3)
CHLORIDE: 100 mmol/L — AB (ref 101–111)
CHLORIDE: 98 mmol/L — AB (ref 101–111)
CO2: 25 mmol/L (ref 22–32)
CO2: 27 mmol/L (ref 22–32)
CREATININE: 1.92 mg/dL — AB (ref 0.44–1.00)
CREATININE: 2.08 mg/dL — AB (ref 0.44–1.00)
GFR calc Af Amer: 25 mL/min — ABNORMAL LOW (ref >60)
GFR calc non Af Amer: 24 mL/min — ABNORMAL LOW (ref >60)
GFR calc non Af Amer: 22 mL/min — ABNORMAL LOW (ref >60)
GFR, EST AFRICAN AMERICAN: 28 mL/min — AB (ref >60)
Glucose, Bld: 258 mg/dL — ABNORMAL HIGH (ref 65–99)
Glucose, Bld: 295 mg/dL — ABNORMAL HIGH (ref 65–99)
Potassium: 3.7 mmol/L (ref 3.5–5.1)
Potassium: 3.7 mmol/L (ref 3.5–5.1)
SODIUM: 137 mmol/L (ref 135–145)
SODIUM: 137 mmol/L (ref 135–145)

## 2016-09-06 LAB — CBC
HCT: 40 % (ref 36.0–46.0)
Hemoglobin: 13.1 g/dL (ref 12.0–15.0)
MCH: 28.9 pg (ref 26.0–34.0)
MCHC: 32.8 g/dL (ref 30.0–36.0)
MCV: 88.1 fL (ref 78.0–100.0)
Platelets: 238 10*3/uL (ref 150–400)
RBC: 4.54 MIL/uL (ref 3.87–5.11)
RDW: 15 % (ref 11.5–15.5)
WBC: 8.4 10*3/uL (ref 4.0–10.5)

## 2016-09-06 LAB — GLUCOSE, CAPILLARY
GLUCOSE-CAPILLARY: 212 mg/dL — AB (ref 65–99)
GLUCOSE-CAPILLARY: 244 mg/dL — AB (ref 65–99)
Glucose-Capillary: 316 mg/dL — ABNORMAL HIGH (ref 65–99)
Glucose-Capillary: 219 mg/dL — ABNORMAL HIGH (ref 65–99)

## 2016-09-06 LAB — HEPARIN LEVEL (UNFRACTIONATED): Heparin Unfractionated: 0.33 IU/mL (ref 0.30–0.70)

## 2016-09-06 MED ORDER — INSULIN GLARGINE 100 UNIT/ML ~~LOC~~ SOLN
20.0000 [IU] | Freq: Every day | SUBCUTANEOUS | Status: DC
  Administered 2016-09-06: 20 [IU] via SUBCUTANEOUS
  Filled 2016-09-06: qty 0.2

## 2016-09-06 MED ORDER — INSULIN ASPART 100 UNIT/ML ~~LOC~~ SOLN
0.0000 [IU] | Freq: Every day | SUBCUTANEOUS | Status: DC
  Administered 2016-09-06: 2 [IU] via SUBCUTANEOUS
  Administered 2016-09-07 – 2016-09-09 (×3): 3 [IU] via SUBCUTANEOUS
  Administered 2016-09-10 – 2016-09-14 (×5): 2 [IU] via SUBCUTANEOUS
  Administered 2016-09-15: 3 [IU] via SUBCUTANEOUS

## 2016-09-06 MED ORDER — INSULIN ASPART 100 UNIT/ML ~~LOC~~ SOLN
0.0000 [IU] | Freq: Three times a day (TID) | SUBCUTANEOUS | Status: DC
  Administered 2016-09-06: 15 [IU] via SUBCUTANEOUS
  Administered 2016-09-06: 7 [IU] via SUBCUTANEOUS
  Administered 2016-09-07: 4 [IU] via SUBCUTANEOUS
  Administered 2016-09-07: 11 [IU] via SUBCUTANEOUS
  Administered 2016-09-07: 3 [IU] via SUBCUTANEOUS
  Administered 2016-09-08: 18:00:00 11 [IU] via SUBCUTANEOUS
  Administered 2016-09-09: 4 [IU] via SUBCUTANEOUS
  Administered 2016-09-09: 08:00:00 7 [IU] via SUBCUTANEOUS
  Administered 2016-09-09 – 2016-09-10 (×2): 11 [IU] via SUBCUTANEOUS
  Administered 2016-09-10 – 2016-09-11 (×3): 7 [IU] via SUBCUTANEOUS

## 2016-09-06 MED FILL — Heparin Sodium (Porcine) 2 Unit/ML in Sodium Chloride 0.9%: INTRAMUSCULAR | Qty: 500 | Status: AC

## 2016-09-06 NOTE — Care Management Important Message (Signed)
Important Message  Patient Details  Name: Kristina Howell MRN: [id number] Date of Birth: [birth year]   Medicare Important Message Given:  Yes    Aliviana Burdell 09/06/2016, 12:40 PM

## 2016-09-06 NOTE — Progress Notes (Signed)
Occupational Therapy Treatment Patient Details Name: Kristina Howell MRN: [id number] DOB: [birth year] Today's Date: 09/06/2016    History of present illness 81 y.o. female presenting with worsening dyspnea and admitted with CHF exacerbation. PMH is significant for T2DM on insulin, persistent afib, morbid obesity, HTN, gout, and pancreatitis.     OT comments  Pt with improved activity tolerance for standing grooming, toileting and LB ADL. Reinforced energy conservation strategies and use of AE for LB ADL.  Follow Up Recommendations  Home health OT    Equipment Recommendations  None recommended by OT    Recommendations for Other Services      Precautions / Restrictions Precautions Precautions: Fall Restrictions Weight Bearing Restrictions: No       Mobility Bed Mobility               General bed mobility comments: Received in recliner  Transfers   Equipment used: Rolling walker (2 wheeled) Transfers: Sit to/from Stand Sit to Stand: Min guard         General transfer comment: Pt demo safe technique.    Balance   Sitting-balance support: Feet supported;No upper extremity supported Sitting balance-Leahy Scale: Good     Standing balance support: Bilateral upper extremity supported Standing balance-Leahy Scale: Fair Standing balance comment: RW needed for ambulation                   ADL Overall ADL's : Needs assistance/impaired     Grooming: Wash/dry hands;Standing;Min guard       Lower Body Bathing: Maximal assistance;Sit to/from stand Lower Body Bathing Details (indicate cue type and reason): educated in use of long handled bath sponge and reacher     Lower Body Dressing: Minimal assistance;Sitting/lateral leans Lower Body Dressing Details (indicate cue type and reason): for socks Toilet Transfer: Min guard;Ambulation;RW;BSC   Toileting- Clothing Manipulation and Hygiene: Minimal assistance;Sit to/from stand Toileting - Clothing Manipulation  Details (indicate cue type and reason): assisted for gown     Functional mobility during ADLs: Min guard;Rolling walker General ADL Comments: Pt with improved activity tolerance.      Vision                     Perception     Praxis      Cognition   Behavior During Therapy: WFL for tasks assessed/performed Overall Cognitive Status: Within Functional Limits for tasks assessed                         Exercises     Shoulder Instructions       General Comments      Pertinent Vitals/ Pain       Pain Assessment: No/denies pain  Home Living                                          Prior Functioning/Environment              Frequency  Min 2X/week        Progress Toward Goals  OT Goals(current goals can now be found in the care plan section)  Progress towards OT goals: Progressing toward goals  Acute Rehab OT Goals Patient Stated Goal: get home to usual activities Time For Goal Achievement: 09/07/16 Potential to Achieve Goals: Good  Plan Discharge plan remains appropriate    Co-evaluation  End of Session Equipment Utilized During Treatment: Rolling walker  OT Visit Diagnosis: Other abnormalities of gait and mobility (R26.89);Unsteadiness on feet (R26.81);Muscle weakness (generalized) (M62.81)   Activity Tolerance Patient tolerated treatment well   Patient Left in chair;with call bell/phone within reach   Nurse Communication          Time: 7308-5694 OT Time Calculation (min): 27 min  Charges: OT General Charges $OT Visit: 1 Procedure OT Treatments $Self Care/Home Management : 23-37 mins     Malka So 09/06/2016, 2:31 PM  [phone]

## 2016-09-06 NOTE — Progress Notes (Signed)
Progress Note  Patient Name: Kristina Howell Date of Encounter: 09/06/2016  Primary Cardiologist:  Hamilton Capri  ([facility])    Subjective   Breathing better  Not at baseline  No CP    Inpatient Medications    Scheduled Meds: . allopurinol  300 mg Oral Daily  . amLODipine  10 mg Oral Daily  . furosemide  40 mg Intravenous BID  . hydrALAZINE  25 mg Oral Q8H  . insulin aspart  0-20 Units Subcutaneous TID WC  . insulin aspart  0-5 Units Subcutaneous QHS  . insulin glargine  20 Units Subcutaneous QHS  . isosorbide mononitrate  30 mg Oral Daily  . living well with diabetes book   Does not apply Once  . metoprolol succinate  50 mg Oral Daily  . rosuvastatin  20 mg Oral q1800  . silver sulfADIAZINE   Topical Daily  . sodium chloride flush  3 mL Intravenous Q12H  . sodium chloride flush  3 mL Intravenous Q12H   Continuous Infusions: . heparin 1,450 Units/hr (09/05/16 1922)   PRN Meds: sodium chloride, sodium chloride, acetaminophen, hydrALAZINE, ondansetron (ZOFRAN) IV, sodium chloride flush, sodium chloride flush   Vital Signs    Vitals:   09/05/16 0940 09/05/16 1206 09/05/16 1915 09/06/16 0417  BP: (!) 146/45 (!) 137/56 137/60 (!) 168/94  Pulse: 73 80 71 73  Resp: 18 18 18 18   Temp: 97.7 F (36.5 C) 97.6 F (36.4 C) 98.1 F (36.7 C) 97.8 F (36.6 C)  TempSrc: Oral Oral Oral Oral  SpO2: 98% 98% 98% 98%  Weight:    274 lb 8 oz (124.5 kg)  Height:        Intake/Output Summary (Last 24 hours) at 09/06/16 0819 Last data filed at 09/06/16 0422  Gross per 24 hour  Intake           1095.5 ml  Output             3350 ml  Net          -2254.5 ml    I/O  Neg 9.6 L    Filed Weights   09/04/16 0432 09/05/16 0457 09/06/16 0417  Weight: 276 lb 11.2 oz (125.5 kg) 275 lb 12.8 oz (125.1 kg) 274 lb 8 oz (124.5 kg)    Telemetry    Afib  Rates controlled   ECG      Physical Exam   GEN: No acute distress.   Neck: JVP is increased mildly    Cardiac: RRR, no murmurs,  rubs, or gallops.  Respiratory: Clear to auscultation bilaterally.   Decreased BS at bases   GI: Soft, nontender, non-distended  MS: 1+ edemia with erythema  No deformity. Neuro:  Nonfocal  Psych: Normal affect   Labs    Chemistry Recent Labs Lab 09/03/16 0031 09/03/16 0430 09/04/16 0521 09/05/16 0311  NA 136 134* 137 135  K 3.8 4.0 3.9 3.7  CL 103 102 101 101  CO2 23 21* 24 25  GLUCOSE 262* 249* 207* 211*  BUN 42* 40* 36* 40*  CREATININE 1.82* 1.78* 1.78* 2.05*  CALCIUM 8.6* 8.8* 8.9 9.1  PROT 5.5*  --   --   --   ALBUMIN 2.5*  --   --   --   AST 24  --   --   --   ALT 25  --   --   --   ALKPHOS 79  --   --   --   BILITOT 0.4  --   --   --  GFRNONAA 25* 26* 26* 22*  GFRAA 30* 30* 30* 26*  ANIONGAP 10 11 12 9      Hematology Recent Labs Lab 09/03/16 0031 09/04/16 0521 09/05/16 0311  WBC 8.8 7.1 8.8  RBC 4.23 4.27 4.29  HGB 11.7* 11.9* 12.0  HCT 37.1 37.9 38.0  MCV 87.7 88.8 88.6  MCH 27.7 27.9 28.0  MCHC 31.5 31.4 31.6  RDW 14.9 15.1 15.1  PLT 257 243 271    Cardiac EnzymesNo results for input(s): TROPONINI in the last 168 hours. No results for input(s): TROPIPOC in the last 168 hours.   BNPNo results for input(s): BNP, PROBNP in the last 168 hours.   DDimer No results for input(s): DDIMER in the last 168 hours.   Radiology    No results found.  Cardiac Studies        Cath (09/03/16):  1. Multivessel CAD, including 90% mid LAD stenosis involving small diagonal branch, 70% ostial/proximal ramus lesion, 30% OM stenosis and sequential 70-80% proximal/mid RCA stenoses. 2. Mildly elevated left ventricular filling pressure. 3. Mild to moderate aortic valve gradient, which is suboptimally evaluated due to heart rate variability related to atrial fibrillation. 4. Right radial artery stenosis, which is likely a combination of fixed disease and vasospasm. Stenosis was successfully navigated with angled micropuncture and Versacore  wires.  Recommendations: 1. Medical optimization, including further diuresis and close monitoring of renal function. Plan for stage PCI to mid LAD and proximal/mid RCA next week when volume status has improved and renal function has improved/stabilized 81 y.o. female severe CAD an systolic CHF  Admitted with LE edema and SOB     Assessment & Plan   1  Acute on chronic systolic CHF  Volume is still up but improved  Will hold further lasix for now with bump in Cr  Check later today and in am    2  CAD  Cath last week showed 90% mid LAD; 70% ramus; 30% OM 80% prox/mid  3  Atrial fib  Rates controlled  On heparin  Signed, Dorris Carnes, MD  09/06/2016, 8:19 AM

## 2016-09-06 NOTE — Progress Notes (Signed)
Family Medicine Teaching Service Daily Progress Note Intern Pager: [phone]  Patient name: Kristina Howell Medical record number: [id number] Date of birth: March 04, 1936 Age: 81 y.o. Gender: female  Primary Care Provider: Jeri Modena Consultants: Cardiology  Code Status: full  Pt Overview and Major Events to Date:  08/29/2016: Admit to FPTS 09/03/2016: Left Heart Cath -- Multivessel CAD, including 90% mid LAD stenosis involving small diagonal branch, 70% ostial/proximal ramus lesion, 30% OM stenosis and sequential 70-80% proximal/mid RCA stenoses  Assessment and Plan: Kristina Scottonis a 81 y.o.femalepresenting with worsening dyspnea. PMH is significant for T2DM on insulin, persistent afib, morbid obesity, HTN, gout, and pancreatitis.   new CHF diagnosis, stable: Still hypervolemic. Net -9.8L since admit. Daily wts 280 lbs >>274 (Dry weight 270) - Cards Recs: plan for stage PCI to mid LAD and proximal/mid RCA once volume status and renal function improved - Continue to diurese with 40 IV lasix BID - BMP pending for today  T2DM, stable:  Home lantus 20u nightly. CBG 212-287 and required 19u SSI over last 24h with 18u lantus last night. - CBGs qACHS - Inc Lantus to 20u nightly from 18u nightly.  - moderate sliding scale > resistant sliding scale  HTN, stable - elevated this AM before meds to 168/94. - hold benazepril, consider switching to losartan at DC for uric acid lowering properties - cont imdur, hydralazine 25 mg TID, metoprolol XL 50 mg, norvasc 10 mg (new this admit while holding ACE) - monitor BP  AKI, worseSlight worsening. SCr 1.64 >> 2.05Baseline may be ~ 1.0 (2015). Related to CHF exacerbation and diuresis.  - Hold home ACE until SCr improves - continue to monitor  - AM BMP pending  Hypercholesterolemia, stable LDL 180 - cont Crestor 20 mg qd  R foot ulcer, stable Callused, does not appear infected. - Consult WOC while inpatient  Persistent afib,  stable Rate controlled - cont heparin gtt in preparation for re-cath; resume home eliquis afterwards - cont home toprol 50 mg  Hx of gout, stable  Continue home allopurinol  FEN/GI: Heart healthy/carb modified, SLIV Prophylaxis: On eliquis  Disposition: will follow PT recs, maybe home, pending further diuresis and cardiac workup/PCI  Subjective:  No acute events overnight, patient endorses continued shortness of breath but feels this has improved.  Objective: Temp:  [97.6 F (36.4 C)-98.1 F (36.7 C)] 97.8 F (36.6 C) (02/19 0417) Pulse Rate:  [71-80] 73 (02/19 0417) Resp:  [18] 18 (02/19 0417) BP: (137-168)/(45-94) 168/94 (02/19 0417) SpO2:  [98 %] 98 % (02/19 0417) Weight:  [274 lb 8 oz (124.5 kg)] 274 lb 8 oz (124.5 kg) (02/19 0417) Physical Exam: General: NAD, sitting up in bed Cardiovascular: RRR, no m/r/g Respiratory: CTA bil, diminished sounds at the lung bases, no W/R/R Abdomen: obese, soft, nontender, nondistended Extremities: 1-2+ pitting edema up to knees Derm: chronic hyperpigmentation venous changes over lower leg. Callused region to R plantar eminence below great toe about 2cmx2.5cm.   Laboratory:  Recent Labs Lab 09/03/16 0031 09/04/16 0521 09/05/16 0311  WBC 8.8 7.1 8.8  HGB 11.7* 11.9* 12.0  HCT 37.1 37.9 38.0  PLT 257 243 271    Recent Labs Lab 09/03/16 0031 09/03/16 0430 09/04/16 0521 09/05/16 0311  NA 136 134* 137 135  K 3.8 4.0 3.9 3.7  CL 103 102 101 101  CO2 23 21* 24 25  BUN 42* 40* 36* 40*  CREATININE 1.82* 1.78* 1.78* 2.05*  CALCIUM 8.6* 8.8* 8.9 9.1  PROT 5.5*  --   --   --  BILITOT 0.4  --   --   --   ALKPHOS 79  --   --   --   ALT 25  --   --   --   AST 24  --   --   --   GLUCOSE 262* 249* 207* 211*    Imaging/Diagnostic Tests: No results found.   Everrett Coombe, MD 09/06/2016, 9:29 AM PGY-1, [place] Intern pager: [phone], text pages welcome

## 2016-09-06 NOTE — Progress Notes (Signed)
ANTICOAGULATION CONSULT NOTE - Follow Up Consult  Pharmacy Consult for Heparin (apixaban on hold) Indication: chest pain/ACS and atrial fibrillation, s/p cath  No Known Allergies  Patient Measurements: Height: 5\' 6"  (167.6 cm) Weight: 274 lb 8 oz (124.5 kg) IBW/kg (Calculated) : 59.3  HDW: 90.2kg  Vital Signs: Temp: 97.8 F (36.6 C) (02/19 0417) Temp Source: Oral (02/19 0417) BP: 168/94 (02/19 0417) Pulse Rate: 73 (02/19 0417)  Labs:  Recent Labs  09/03/16 1241 09/03/16 2329 09/04/16 0521  09/04/16 2003 09/05/16 0311 09/06/16 0532  HGB  --   --  11.9*  --   --  12.0  --   HCT  --   --  37.9  --   --  38.0  --   PLT  --   --  243  --   --  271  --   APTT 87* 49*  --   --   --   --   --   HEPARINUNFRC  --  0.19*  --   < > 0.37 0.40 0.33  CREATININE  --   --  1.78*  --   --  2.05*  --   < > = values in this interval not displayed.  Estimated Creatinine Clearance: 30.5 mL/min (by C-G formula based on SCr of 2.05 mg/dL (H)).  Assessment: Heparin while apixaban on hold for cath which was done 2/16, planning for heparin and staged PCI this week. Heparin level remains therapeutic at 0.33 on heparin 1450 units/hr. No issues with infusion or bleeding noted.  Goal of Therapy:  Heparin level 0.3-0.7 units/mL Monitor platelets by anticoagulation protocol: Yes   Plan:  Continue heparin 1450 units/hr Daily CBC/HL, monitor S/Sx bleeding   Andrey Cota. Diona Foley, PharmD, BCPS Clinical Pharmacist [phone] 09/06/2016 8:30 AM

## 2016-09-06 NOTE — Progress Notes (Signed)
Physical Therapy Treatment Patient Details Name: Kristina Howell MRN: [id number] DOB: Dec 07, 1935 Today's Date: 09/06/2016    History of Present Illness 81 y.o. female presenting with worsening dyspnea and admitted with CHF exacerbation. PMH is significant for T2DM on insulin, persistent afib, morbid obesity, HTN, gout, and pancreatitis.      PT Comments    Pt awaiting stent placement. More fluid needs to be removed before able to proceed with procedure. Pt is making good progress with mobility. PT to continue per POC.   Follow Up Recommendations  Home health PT;Supervision for mobility/OOB     Equipment Recommendations  None recommended by PT    Recommendations for Other Services       Precautions / Restrictions Precautions Precautions: Fall    Mobility  Bed Mobility               General bed mobility comments: Received in recliner  Transfers   Equipment used: Rolling walker (2 wheeled)   Sit to Stand: Min guard         General transfer comment: Pt demo safe technique.  Ambulation/Gait Ambulation/Gait assistance: Min guard Ambulation Distance (Feet): 100 Feet Assistive device: Rolling walker (2 wheeled) Gait Pattern/deviations: Step-through pattern;Decreased stride length Gait velocity: decreased Gait velocity interpretation: Below normal speed for age/gender General Gait Details: SpO2 at rest on RA 91%. Pt ambulated on RA with SpO2 92%. Pt has O2 PRN. 2 L O2 via  replaced after session with sats increasing to 98%.   Stairs            Wheelchair Mobility    Modified Rankin (Stroke Patients Only)       Balance   Sitting-balance support: Feet supported;No upper extremity supported Sitting balance-Leahy Scale: Good     Standing balance support: Bilateral upper extremity supported Standing balance-Leahy Scale: Fair Standing balance comment: RW needed for ambulation                    Cognition Arousal/Alertness:  Awake/alert Behavior During Therapy: WFL for tasks assessed/performed Overall Cognitive Status: Within Functional Limits for tasks assessed                      Exercises      General Comments        Pertinent Vitals/Pain Pain Assessment: No/denies pain    Home Living                      Prior Function            PT Goals (current goals can now be found in the care plan section) Acute Rehab PT Goals Patient Stated Goal: get home to usual activities PT Goal Formulation: With patient Time For Goal Achievement: 09/13/16 Potential to Achieve Goals: Good Progress towards PT goals: Progressing toward goals    Frequency    Min 3X/week      PT Plan Current plan remains appropriate    Co-evaluation             End of Session Equipment Utilized During Treatment: Gait belt Activity Tolerance: Patient tolerated treatment well Patient left: in chair;with call bell/phone within reach Nurse Communication: Mobility status PT Visit Diagnosis: Difficulty in walking, not elsewhere classified (R26.2)     Time: 9450-3888 PT Time Calculation (min) (ACUTE ONLY): 13 min  Charges:  $Gait Training: 8-22 mins  G Codes:       Lorriane Shire 09/06/2016, 10:47 AM

## 2016-09-07 ENCOUNTER — Ambulatory Visit: Payer: PPO | Admitting: Cardiology

## 2016-09-07 LAB — CBC
HCT: 37.4 % (ref 36.0–46.0)
HEMOGLOBIN: 11.9 g/dL — AB (ref 12.0–15.0)
MCH: 28.1 pg (ref 26.0–34.0)
MCHC: 31.8 g/dL (ref 30.0–36.0)
MCV: 88.4 fL (ref 78.0–100.0)
PLATELETS: 231 10*3/uL (ref 150–400)
RBC: 4.23 MIL/uL (ref 3.87–5.11)
RDW: 15.4 % (ref 11.5–15.5)
WBC: 8 10*3/uL (ref 4.0–10.5)

## 2016-09-07 LAB — BASIC METABOLIC PANEL
ANION GAP: 10 (ref 5–15)
BUN: 36 mg/dL — ABNORMAL HIGH (ref 6–20)
CALCIUM: 9.3 mg/dL (ref 8.9–10.3)
CO2: 28 mmol/L (ref 22–32)
Chloride: 100 mmol/L — ABNORMAL LOW (ref 101–111)
Creatinine, Ser: 1.78 mg/dL — ABNORMAL HIGH (ref 0.44–1.00)
GFR, EST AFRICAN AMERICAN: 30 mL/min — AB (ref >60)
GFR, EST NON AFRICAN AMERICAN: 26 mL/min — AB (ref >60)
Glucose, Bld: 165 mg/dL — ABNORMAL HIGH (ref 65–99)
Potassium: 3.7 mmol/L (ref 3.5–5.1)
SODIUM: 138 mmol/L (ref 135–145)

## 2016-09-07 LAB — HEPARIN LEVEL (UNFRACTIONATED): Heparin Unfractionated: 0.38 IU/mL (ref 0.30–0.70)

## 2016-09-07 LAB — GLUCOSE, CAPILLARY
GLUCOSE-CAPILLARY: 141 mg/dL — AB (ref 65–99)
GLUCOSE-CAPILLARY: 263 mg/dL — AB (ref 65–99)
Glucose-Capillary: 174 mg/dL — ABNORMAL HIGH (ref 65–99)
Glucose-Capillary: 278 mg/dL — ABNORMAL HIGH (ref 65–99)

## 2016-09-07 LAB — PROTIME-INR
INR: 1.04
Prothrombin Time: 13.7 seconds (ref 11.4–15.2)

## 2016-09-07 MED ORDER — INSULIN GLARGINE 100 UNIT/ML ~~LOC~~ SOLN: 10.0000 [IU] | Freq: Every day | SUBCUTANEOUS | Status: DC

## 2016-09-07 MED ORDER — ASPIRIN 81 MG PO CHEW
81.0000 mg | CHEWABLE_TABLET | ORAL | Status: AC
  Administered 2016-09-08: 81 mg via ORAL
  Filled 2016-09-07: qty 1

## 2016-09-07 MED ORDER — INSULIN GLARGINE 100 UNIT/ML ~~LOC~~ SOLN
10.0000 [IU] | Freq: Every day | SUBCUTANEOUS | Status: AC
  Administered 2016-09-07: 10 [IU] via SUBCUTANEOUS
  Filled 2016-09-07: qty 0.1

## 2016-09-07 MED ORDER — INSULIN GLARGINE 100 UNIT/ML ~~LOC~~ SOLN: 20.0000 [IU] | Freq: Every day | SUBCUTANEOUS | Status: DC

## 2016-09-07 MED ORDER — SODIUM CHLORIDE 0.9 % IV SOLN
INTRAVENOUS | Status: DC
  Administered 2016-09-07: 22:00:00 via INTRAVENOUS

## 2016-09-07 MED ORDER — SODIUM CHLORIDE 0.9 % IV SOLN: 250.0000 mL | INTRAVENOUS | Status: DC | PRN

## 2016-09-07 MED ORDER — SODIUM CHLORIDE 0.9% FLUSH: 3.0000 mL | INTRAVENOUS | Status: DC | PRN

## 2016-09-07 MED ORDER — SODIUM CHLORIDE 0.9% FLUSH
3.0000 mL | Freq: Two times a day (BID) | INTRAVENOUS | Status: DC
  Administered 2016-09-07: 3 mL via INTRAVENOUS

## 2016-09-07 NOTE — Progress Notes (Signed)
Patient dressing changed on right foot. Minimal clear drainage at site of wound. Patient has no complaint of pain at the site of wound.   Alma Friendly, Student RN

## 2016-09-07 NOTE — Progress Notes (Signed)
Family Medicine Teaching Service Daily Progress Note Intern Pager: [phone]  Patient name: Kristina Howell Medical record number: [id number] Date of birth: 27-May-1936 Age: 81 y.o. Gender: female  Primary Care Provider: Jeri Modena Consultants: Cardiology  Code Status: full  Pt Overview and Major Events to Date:  08/29/2016: Admit to FPTS 09/03/2016: Left Heart Cath -- Multivessel CAD, including 90% mid LAD stenosis involving small diagonal branch, 70% ostial/proximal ramus lesion, 30% OM stenosis and sequential 70-80% proximal/mid RCA stenoses  Assessment and Plan: Hero Scottonis a 81 y.o.femalepresenting with worsening dyspnea. PMH is significant for T2DM on insulin, persistent afib, morbid obesity, HTN, gout, and pancreatitis.   new CHF diagnosis, stable: Still hypervolemic. Net -11.7L since admit. Daily wts 280 lbs >>272 (Dry weight 270).  Monitoring creatinine, improved today at 1.78 from 2.08. - Cards Recs: plan for stage PCI to mid LAD and proximal/mid RCA once volume status and renal function improved - Will hold lasix today pending cardiology recs in case planning for PCI tomorrow  T2DM, stable:  Home lantus 20u nightly. CBG 212-287 and required 19u SSI over last 24h with 18u lantus last night. - CBGs qACHS - Lantus 20u QHS >> Will give 10u tonight while NPO for PCI tomorrow - moderate sliding scale > resistant sliding scale  HTN, stable - normotensive this morning - hold benazepril, consider switching to losartan at DC for uric acid lowering properties - cont imdur, hydralazine 25 mg TID, metoprolol XL 50 mg, norvasc 10 mg (new this admit while holding ACE) - monitor BP  AKI, worseSlight worsening. SCr 1.64 > 2.05 >1.78Baseline may be ~ 1.0 (2015). Related to CHF exacerbation and diuresis.  - Hold home ACE until SCr improves - continue to monitor  - AM BMP pending  Hypercholesterolemia, stable LDL 180 - cont Crestor 20 mg qd  R foot ulcer, stable  Callused, does not appear infected. - Consult WOC while inpatient  Persistent afib, stable Rate controlled - cont heparin gtt in preparation for re-cath; resume home eliquis afterwards - cont home toprol 50 mg  Hx of gout, stable  Continue home allopurinol  FEN/GI: Heart healthy/carb modified, SLIV Prophylaxis: On eliquis  Disposition: PT recommends home health PT, supervision for mobility/OOB   Subjective:  No acute events overnight. Patient denies chest pain or dyspnea at this morning.  Objective: Temp:  [97.8 F (36.6 C)-98.1 F (36.7 C)] 97.8 F (36.6 C) (02/20 0400) Pulse Rate:  [69-72] 69 (02/20 0400) Resp:  [18] 18 (02/20 0400) BP: (136-165)/(61-62) 136/62 (02/20 0400) SpO2:  [99 %] 99 % (02/20 0400) Weight:  [272 lb 14.4 oz (123.8 kg)] 272 lb 14.4 oz (123.8 kg) (02/20 0400) Physical Exam: General: NAD, sitting up in bed Cardiovascular: RRR, no m/r/g Respiratory: CTA bil, diminished sounds at the lung bases, no W/R/R Abdomen: obese, soft, nontender, nondistended Extremities: 2+ pitting edema noted over shins bilaterally Derm: chronic hyperpigmentation venous changes over lower leg. Callused region to R plantar eminence below great toe about 2cmx2.5cm.   Laboratory:  Recent Labs Lab 09/05/16 0311 09/06/16 1552 09/07/16 0552  WBC 8.8 8.4 8.0  HGB 12.0 13.1 11.9*  HCT 38.0 40.0 37.4  PLT 271 238 231    Recent Labs Lab 09/03/16 0031  09/06/16 1003 09/06/16 1552 09/07/16 0552  NA 136  < > 137 137 138  K 3.8  < > 3.7 3.7 3.7  CL 103  < > 100* 98* 100*  CO2 23  < > 25 27 28   BUN 42*  < >  37* 39* 36*  CREATININE 1.82*  < > 1.92* 2.08* 1.78*  CALCIUM 8.6*  < > 9.2 9.4 9.3  PROT 5.5*  --   --   --   --   BILITOT 0.4  --   --   --   --   ALKPHOS 79  --   --   --   --   ALT 25  --   --   --   --   AST 24  --   --   --   --   GLUCOSE 262*  < > 258* 295* 165*  < > = values in this interval not displayed.  Imaging/Diagnostic Tests: No results  found.   Everrett Coombe, MD 09/07/2016, 11:56 AM PGY-1, [place] Intern pager: [phone], text pages welcome

## 2016-09-07 NOTE — Progress Notes (Signed)
Nursing student and instructor changing lower leg dressing  Alfredo Spong

## 2016-09-07 NOTE — Progress Notes (Signed)
ANTICOAGULATION CONSULT NOTE - Follow Up Consult  Pharmacy Consult for Heparin (apixaban on hold) Indication: chest pain/ACS and atrial fibrillation  No Known Allergies  Patient Measurements: Height: 5\' 6"  (167.6 cm) Weight: 272 lb 14.4 oz (123.8 kg) (scale b) IBW/kg (Calculated) : 59.3  HDW: 90.2kg  Vital Signs: Temp: 97.8 F (36.6 C) (02/20 0400) Temp Source: Oral (02/20 0400) BP: 136/62 (02/20 0400) Pulse Rate: 69 (02/20 0400)  Labs:  Recent Labs  09/05/16 0311 09/06/16 0532 09/06/16 1003 09/06/16 1552 09/07/16 0552  HGB 12.0  --   --  13.1 11.9*  HCT 38.0  --   --  40.0 37.4  PLT 271  --   --  238 231  HEPARINUNFRC 0.40 0.33  --   --  0.38  CREATININE 2.05*  --  1.92* 2.08* 1.78*    Estimated Creatinine Clearance: 35 mL/min (by C-G formula based on SCr of 1.78 mg/dL (H)).  Assessment: Heparin while apixaban on hold for cath which was done 2/16, planning for heparin and staged PCI this week. Heparin level remains therapeutic at 0.38 on heparin 1450 units/hr. No issues with infusion or bleeding noted.  Goal of Therapy:  Heparin level 0.3-0.7 units/mL Monitor platelets by anticoagulation protocol: Yes   Plan:  Continue heparin 1450 units/hr Daily CBC/HL, monitor S/Sx bleeding   Andrey Cota. Diona Foley, PharmD, BCPS Clinical Pharmacist [phone] 09/07/2016 8:03 AM

## 2016-09-07 NOTE — Progress Notes (Signed)
Pt using incentive spirometer several times an hour. Pt level reached 1250, goal 1500, good effort.  Morrisa Aldaba Leory Plowman

## 2016-09-07 NOTE — Progress Notes (Signed)
Progress Note  Patient Name: Kristina Howell Date of Encounter: 09/07/2016  Primary Cardiologist:  [facility] 2015  Subjective   Patient denies CP  Breathing is better    Inpatient Medications    Scheduled Meds: . allopurinol  300 mg Oral Daily  . amLODipine  10 mg Oral Daily  . hydrALAZINE  25 mg Oral Q8H  . insulin aspart  0-20 Units Subcutaneous TID WC  . insulin aspart  0-5 Units Subcutaneous QHS  . insulin glargine  20 Units Subcutaneous QHS  . isosorbide mononitrate  30 mg Oral Daily  . metoprolol succinate  50 mg Oral Daily  . rosuvastatin  20 mg Oral q1800  . silver sulfADIAZINE   Topical Daily  . sodium chloride flush  3 mL Intravenous Q12H  . sodium chloride flush  3 mL Intravenous Q12H   Continuous Infusions: . heparin 1,450 Units/hr (09/06/16 1536)   PRN Meds: sodium chloride, sodium chloride, acetaminophen, hydrALAZINE, ondansetron (ZOFRAN) IV, sodium chloride flush, sodium chloride flush   Vital Signs    Vitals:   09/06/16 1130 09/06/16 1133 09/06/16 2113 09/07/16 0400  BP: (!) 188/54 (!) 150/65 (!) 165/61 136/62  Pulse: 80  72 69  Resp: 18  18 18   Temp: 98.3 F (36.8 C)  98.1 F (36.7 C) 97.8 F (36.6 C)  TempSrc: Oral  Oral Oral  SpO2: 99%  99% 99%  Weight:    272 lb 14.4 oz (123.8 kg)  Height:        Intake/Output Summary (Last 24 hours) at 09/07/16 0749 Last data filed at 09/07/16 0500  Gross per 24 hour  Intake            856.5 ml  Output             2750 ml  Net          -1893.5 ml   I/O   -11.75 L   Filed Weights   09/05/16 0457 09/06/16 0417 09/07/16 0400  Weight: 275 lb 12.8 oz (125.1 kg) 274 lb 8 oz (124.5 kg) 272 lb 14.4 oz (123.8 kg)    Telemetry   Afib   Rates  80s    ECG      Physical Exam   GEN: No acute distress.   Neck: No JVD Cardiac: RRR, no murmurs, rubs, or gallops.  Respiratory: Clear to auscultation bilaterally. GI: Soft, nontender, non-distended  MS: 1+ edema; No deformity. Neuro:  Nonfocal  Psych:  Normal affect   Labs    Chemistry Recent Labs Lab 09/03/16 0031  09/06/16 1003 09/06/16 1552 09/07/16 0552  NA 136  < > 137 137 138  K 3.8  < > 3.7 3.7 3.7  CL 103  < > 100* 98* 100*  CO2 23  < > 25 27 28   GLUCOSE 262*  < > 258* 295* 165*  BUN 42*  < > 37* 39* 36*  CREATININE 1.82*  < > 1.92* 2.08* 1.78*  CALCIUM 8.6*  < > 9.2 9.4 9.3  PROT 5.5*  --   --   --   --   ALBUMIN 2.5*  --   --   --   --   AST 24  --   --   --   --   ALT 25  --   --   --   --   ALKPHOS 79  --   --   --   --   BILITOT 0.4  --   --   --   --  GFRNONAA 25*  < > 24* 22* 26*  GFRAA 30*  < > 28* 25* 30*  ANIONGAP 10  < > 12 12 10   < > = values in this interval not displayed.   Hematology Recent Labs Lab 09/05/16 0311 09/06/16 1552 09/07/16 0552  WBC 8.8 8.4 8.0  RBC 4.29 4.54 4.23  HGB 12.0 13.1 11.9*  HCT 38.0 40.0 37.4  MCV 88.6 88.1 88.4  MCH 28.0 28.9 28.1  MCHC 31.6 32.8 31.8  RDW 15.1 15.0 15.4  PLT 271 238 231    Cardiac EnzymesNo results for input(s): TROPONINI in the last 168 hours. No results for input(s): TROPIPOC in the last 168 hours.   BNPNo results for input(s): BNP, PROBNP in the last 168 hours.   DDimer No results for input(s): DDIMER in the last 168 hours.   Radiology    No results found.  Cardiac Studies       Cath (09/03/16):  1. Multivessel CAD, including 90% mid LAD stenosis involving small diagonal branch, 70% ostial/proximal ramus lesion, 30% OM stenosis and sequential 70-80% proximal/mid RCA stenoses. 2. Mildly elevated left ventricular filling pressure. 3. Mild to moderate aortic valve gradient, which is suboptimally evaluated due to heart rate variability related to atrial fibrillation. 4. Right radial artery stenosis, which is likely a combination of fixed disease and vasospasm. Stenosis was successfully navigated with angled micropuncture and Versacore wires.  Recommendations: 1. Medical optimization, including further diuresis and close monitoring  of renal function. Plan for stage PCI to mid LAD and proximal/mid RCA next week when volume status has improved and renal function has improved/stabilized    Patient Profile     81 y.o. female with history of CAD and systolic CHF   Cath last week with signif 2 V CAD   Recomm optimazation of fluid status and then staged PCI  Pt has diuresed significantly  REnal function is improved from yestrea   Assessment & Plan    1  CAD  Plan for PCI tomorrow.    2  Acute on chronic systolic CHF  LVEF 50% with severe hypokinesis of mid anteroseptal, apical septla apical wall    RVEF normal   Volume status improved since last week with signif diuresis  Pt comfortable though still with vol increase    3  CKD stage III  Cr 1.7  Near baselinie  WIll hold f  Signed, Dorris Carnes, MD  09/07/2016, 7:49 AM

## 2016-09-07 NOTE — Progress Notes (Signed)
Order pt cath/PCI CCTV, pt watching now  Kristina Howell Leory Plowman

## 2016-09-07 NOTE — Progress Notes (Signed)
Patient resting in bed comfortably, refused bed alarm on, educated on patient's safety plan. Will continue to do hourly rounding.

## 2016-09-07 NOTE — Progress Notes (Signed)
Occupational Therapy Treatment Patient Details Name: Kristina Howell MRN: [id number] DOB: [birth year] Today's Date: 09/07/2016    History of present illness 81 y.o. female presenting with worsening dyspnea and admitted with CHF exacerbation. PMH is significant for T2DM on insulin, persistent afib, morbid obesity, HTN, gout, and pancreatitis.     OT comments  Pt continues to progress steadily. Eager to have stents placed tomorrow and return home. Pt able to perform 2 standing grooming activities, toileting and walking within her room without dyspnea or rest breaks.  Follow Up Recommendations  Home health OT    Equipment Recommendations  None recommended by OT    Recommendations for Other Services      Precautions / Restrictions Precautions Precautions: Fall       Mobility Bed Mobility               General bed mobility comments: Received in recliner  Transfers Overall transfer level: Needs assistance Equipment used: Rolling walker (2 wheeled) Transfers: Sit to/from Stand Sit to Stand: Supervision         General transfer comment: increased time, no physical assist    Balance                                   ADL Overall ADL's : Needs assistance/impaired     Grooming: Oral care;Standing;Supervision/safety           Upper Body Dressing : Set up;Sitting       Toilet Transfer: Supervision/safety;Ambulation;RW;BSC   Toileting- Water quality scientist and Hygiene: Supervision/safety;Sit to/from stand       Functional mobility during ADLs: Supervision/safety;Rolling walker General ADL Comments: activity tolerance continues to improve      Vision                     Perception     Praxis      Cognition   Behavior During Therapy: [facility] for tasks assessed/performed Overall Cognitive Status: Within Functional Limits for tasks assessed                         Exercises     Shoulder Instructions       General  Comments      Pertinent Vitals/ Pain       Pain Assessment: No/denies pain  Home Living                                          Prior Functioning/Environment              Frequency  Min 2X/week        Progress Toward Goals  OT Goals(current goals can now be found in the care plan section)  Progress towards OT goals: Progressing toward goals  Acute Rehab OT Goals Patient Stated Goal: get home to usual activities Time For Goal Achievement: 09/14/16 Potential to Achieve Goals: Good  Plan Discharge plan remains appropriate    Co-evaluation                 End of Session Equipment Utilized During Treatment: Gait belt;Rolling walker  OT Visit Diagnosis: Unsteadiness on feet (R26.81);Other abnormalities of gait and mobility (R26.89)   Activity Tolerance Patient tolerated treatment well   Patient Left in chair;with call bell/phone within reach   Nurse  Communication          Time: 6394-3200 OT Time Calculation (min): 21 min  Charges: OT General Charges $OT Visit: 1 Procedure OT Treatments $Self Care/Home Management : 8-22 mins    Malka So 09/07/2016, 1:09 PM  [phone]

## 2016-09-07 NOTE — Progress Notes (Signed)
Pt slept well during the night, Vitals stable, no any sign of SOB and distress noted, no any complain of pain, IV heparin is continue @14 .5  will continue to monitor the patient.

## 2016-09-08 ENCOUNTER — Encounter (HOSPITAL_COMMUNITY)
Admission: EM | Disposition: A | Discharge status: Home Health | Payer: Self-pay | Source: Home / Self Care | Attending: Family Medicine

## 2016-09-08 ENCOUNTER — Inpatient Hospital Stay (HOSPITAL_COMMUNITY): Payer: PPO | Admitting: Anesthesiology

## 2016-09-08 ENCOUNTER — Encounter (HOSPITAL_COMMUNITY): Payer: Self-pay | Admitting: Interventional Cardiology

## 2016-09-08 ENCOUNTER — Other Ambulatory Visit: Payer: Self-pay

## 2016-09-08 ENCOUNTER — Inpatient Hospital Stay (HOSPITAL_COMMUNITY): Payer: PPO

## 2016-09-08 DIAGNOSIS — R609 Edema, unspecified: Secondary | ICD-10-CM

## 2016-09-08 DIAGNOSIS — I25110 Atherosclerotic heart disease of native coronary artery with unstable angina pectoris: Secondary | ICD-10-CM

## 2016-09-08 DIAGNOSIS — I9789 Other postprocedural complications and disorders of the circulatory system, not elsewhere classified: Secondary | ICD-10-CM

## 2016-09-08 HISTORY — PX: CORONARY STENT INTERVENTION: CATH118234

## 2016-09-08 LAB — BASIC METABOLIC PANEL
ANION GAP: 10 (ref 5–15)
Anion gap: 8 (ref 5–15)
BUN: 32 mg/dL — AB (ref 6–20)
BUN: 35 mg/dL — ABNORMAL HIGH (ref 6–20)
CALCIUM: 9.1 mg/dL (ref 8.9–10.3)
CHLORIDE: 105 mmol/L (ref 101–111)
CO2: 27 mmol/L (ref 22–32)
CO2: 21 mmol/L — ABNORMAL LOW (ref 22–32)
CREATININE: 1.84 mg/dL — AB (ref 0.44–1.00)
Calcium: 8.8 mg/dL — ABNORMAL LOW (ref 8.9–10.3)
Chloride: 101 mmol/L (ref 101–111)
Creatinine, Ser: 2.14 mg/dL — ABNORMAL HIGH (ref 0.44–1.00)
GFR calc Af Amer: 29 mL/min — ABNORMAL LOW (ref >60)
GFR calc Af Amer: 24 mL/min — ABNORMAL LOW (ref >60)
GFR calc non Af Amer: 25 mL/min — ABNORMAL LOW (ref >60)
GFR, EST NON AFRICAN AMERICAN: 21 mL/min — AB (ref >60)
GLUCOSE: 210 mg/dL — AB (ref 65–99)
Glucose, Bld: 269 mg/dL — ABNORMAL HIGH (ref 65–99)
POTASSIUM: 5 mmol/L (ref 3.5–5.1)
Potassium: 4 mmol/L (ref 3.5–5.1)
SODIUM: 136 mmol/L (ref 135–145)
Sodium: 136 mmol/L (ref 135–145)

## 2016-09-08 LAB — GLUCOSE, CAPILLARY
GLUCOSE-CAPILLARY: 187 mg/dL — AB (ref 65–99)
GLUCOSE-CAPILLARY: 274 mg/dL — AB (ref 65–99)
Glucose-Capillary: 189 mg/dL — ABNORMAL HIGH (ref 65–99)
Glucose-Capillary: 255 mg/dL — ABNORMAL HIGH (ref 65–99)

## 2016-09-08 LAB — CBC
HCT: 36.8 % (ref 36.0–46.0)
HCT: 29.9 % — ABNORMAL LOW (ref 36.0–46.0)
HEMATOCRIT: 33.1 % — AB (ref 36.0–46.0)
HEMOGLOBIN: 10.5 g/dL — AB (ref 12.0–15.0)
HEMOGLOBIN: 9.6 g/dL — AB (ref 12.0–15.0)
Hemoglobin: 11.5 g/dL — ABNORMAL LOW (ref 12.0–15.0)
MCH: 27.6 pg (ref 26.0–34.0)
MCH: 28.2 pg (ref 26.0–34.0)
MCH: 28.4 pg (ref 26.0–34.0)
MCHC: 31.3 g/dL (ref 30.0–36.0)
MCHC: 31.7 g/dL (ref 30.0–36.0)
MCHC: 32.1 g/dL (ref 30.0–36.0)
MCV: 88.5 fL (ref 78.0–100.0)
MCV: 89 fL (ref 78.0–100.0)
MCV: 88.5 fL (ref 78.0–100.0)
PLATELETS: 221 10*3/uL (ref 150–400)
PLATELETS: 281 10*3/uL (ref 150–400)
Platelets: 282 10*3/uL (ref 150–400)
RBC: 4.16 MIL/uL (ref 3.87–5.11)
RBC: 3.72 MIL/uL — ABNORMAL LOW (ref 3.87–5.11)
RBC: 3.38 MIL/uL — AB (ref 3.87–5.11)
RDW: 15 % (ref 11.5–15.5)
RDW: 15.1 % (ref 11.5–15.5)
RDW: 15.2 % (ref 11.5–15.5)
WBC: 8.2 10*3/uL (ref 4.0–10.5)
WBC: 15 10*3/uL — AB (ref 4.0–10.5)
WBC: 17.1 10*3/uL — ABNORMAL HIGH (ref 4.0–10.5)

## 2016-09-08 LAB — POCT ACTIVATED CLOTTING TIME
ACTIVATED CLOTTING TIME: 263 s
Activated Clotting Time: 263 seconds
Activated Clotting Time: 274 seconds
Activated Clotting Time: 279 seconds

## 2016-09-08 LAB — HEPARIN LEVEL (UNFRACTIONATED): Heparin Unfractionated: 0.42 IU/mL (ref 0.30–0.70)

## 2016-09-08 SURGERY — CORONARY STENT INTERVENTION

## 2016-09-08 SURGERY — DEBRIDEMENT, INGUINAL REGION
Anesthesia: General

## 2016-09-08 MED ORDER — NITROGLYCERIN IN D5W 200-5 MCG/ML-% IV SOLN
INTRAVENOUS | Status: AC
  Filled 2016-09-08: qty 250

## 2016-09-08 MED ORDER — ASPIRIN 81 MG PO CHEW
81.0000 mg | CHEWABLE_TABLET | Freq: Every day | ORAL | Status: DC
  Administered 2016-09-09 – 2016-09-16 (×8): 81 mg via ORAL
  Filled 2016-09-08 (×8): qty 1

## 2016-09-08 MED ORDER — NITROGLYCERIN IN D5W 200-5 MCG/ML-% IV SOLN
INTRAVENOUS | Status: DC | PRN
  Administered 2016-09-08: 20 ug/min via INTRAVENOUS

## 2016-09-08 MED ORDER — IOPAMIDOL (ISOVUE-370) INJECTION 76%
INTRAVENOUS | Status: DC | PRN
  Administered 2016-09-08: 165 mL via INTRA_ARTERIAL

## 2016-09-08 MED ORDER — CLOPIDOGREL BISULFATE 300 MG PO TABS
ORAL_TABLET | ORAL | Status: DC | PRN
  Administered 2016-09-08: 600 mg via ORAL

## 2016-09-08 MED ORDER — CLOPIDOGREL BISULFATE 75 MG PO TABS
75.0000 mg | ORAL_TABLET | Freq: Every day | ORAL | Status: DC
  Administered 2016-09-09 – 2016-09-16 (×8): 75 mg via ORAL
  Filled 2016-09-08 (×8): qty 1

## 2016-09-08 MED ORDER — HEPARIN SODIUM (PORCINE) 1000 UNIT/ML IJ SOLN
INTRAMUSCULAR | Status: AC
  Filled 2016-09-08: qty 1

## 2016-09-08 MED ORDER — HEPARIN SODIUM (PORCINE) 5000 UNIT/ML IJ SOLN: 5000.0000 [IU] | Freq: Three times a day (TID) | INTRAMUSCULAR | Status: DC

## 2016-09-08 MED ORDER — FUROSEMIDE 10 MG/ML IJ SOLN
40.0000 mg | Freq: Once | INTRAMUSCULAR | Status: AC
  Administered 2016-09-08: 20:00:00 40 mg via INTRAVENOUS
  Filled 2016-09-08: qty 4

## 2016-09-08 MED ORDER — LABETALOL HCL 5 MG/ML IV SOLN: 10.0000 mg | INTRAVENOUS | Status: AC | PRN

## 2016-09-08 MED ORDER — INSULIN GLARGINE 100 UNIT/ML ~~LOC~~ SOLN
10.0000 [IU] | Freq: Every day | SUBCUTANEOUS | Status: AC
  Administered 2016-09-08: 22:00:00 10 [IU] via SUBCUTANEOUS
  Filled 2016-09-08: qty 0.1

## 2016-09-08 MED ORDER — IOPAMIDOL (ISOVUE-370) INJECTION 76%
INTRAVENOUS | Status: AC
  Filled 2016-09-08: qty 125

## 2016-09-08 MED ORDER — MIDAZOLAM HCL 2 MG/2ML IJ SOLN
INTRAMUSCULAR | Status: AC
  Filled 2016-09-08: qty 2

## 2016-09-08 MED ORDER — ONDANSETRON HCL 4 MG/2ML IJ SOLN: 4.0000 mg | Freq: Four times a day (QID) | INTRAMUSCULAR | Status: DC | PRN

## 2016-09-08 MED ORDER — SODIUM CHLORIDE 0.9 % WEIGHT BASED INFUSION: 0.5000 mL/kg/h | INTRAVENOUS | Status: DC

## 2016-09-08 MED ORDER — HEPARIN (PORCINE) IN NACL 2-0.9 UNIT/ML-% IJ SOLN
INTRAMUSCULAR | Status: AC
  Filled 2016-09-08: qty 500

## 2016-09-08 MED ORDER — FENTANYL CITRATE (PF) 100 MCG/2ML IJ SOLN
INTRAMUSCULAR | Status: DC | PRN
  Administered 2016-09-08 (×3): 25 ug via INTRAVENOUS

## 2016-09-08 MED ORDER — SODIUM CHLORIDE 0.9% FLUSH: 3.0000 mL | INTRAVENOUS | Status: DC | PRN

## 2016-09-08 MED ORDER — NITROGLYCERIN 1 MG/10 ML FOR IR/CATH LAB
INTRA_ARTERIAL | Status: AC
  Filled 2016-09-08: qty 10

## 2016-09-08 MED ORDER — LIDOCAINE HCL (PF) 1 % IJ SOLN
INTRAMUSCULAR | Status: DC | PRN
  Administered 2016-09-08: 25 mL via INTRADERMAL

## 2016-09-08 MED ORDER — SODIUM CHLORIDE 0.9 % IV SOLN: 250.0000 mL | INTRAVENOUS | Status: DC | PRN

## 2016-09-08 MED ORDER — HYDROCODONE-ACETAMINOPHEN 5-325 MG PO TABS: 1.0000 | ORAL_TABLET | Freq: Four times a day (QID) | ORAL | Status: DC | PRN

## 2016-09-08 MED ORDER — ACETAMINOPHEN 325 MG PO TABS: 650.0000 mg | ORAL_TABLET | ORAL | Status: DC | PRN

## 2016-09-08 MED ORDER — HEPARIN (PORCINE) IN NACL 2-0.9 UNIT/ML-% IJ SOLN
INTRAMUSCULAR | Status: DC | PRN
  Administered 2016-09-08: 1500 mL via INTRA_ARTERIAL

## 2016-09-08 MED ORDER — NITROGLYCERIN 1 MG/10 ML FOR IR/CATH LAB
INTRA_ARTERIAL | Status: DC | PRN
  Administered 2016-09-08 (×2): 200 ug via INTRACORONARY

## 2016-09-08 MED ORDER — SODIUM CHLORIDE 0.9% FLUSH
3.0000 mL | Freq: Two times a day (BID) | INTRAVENOUS | Status: DC
  Administered 2016-09-09 – 2016-09-10 (×3): 3 mL via INTRAVENOUS
  Administered 2016-09-10: 10 mL via INTRAVENOUS
  Administered 2016-09-11 – 2016-09-16 (×11): 3 mL via INTRAVENOUS

## 2016-09-08 MED ORDER — HEPARIN (PORCINE) IN NACL 2-0.9 UNIT/ML-% IJ SOLN
INTRAMUSCULAR | Status: AC
  Filled 2016-09-08: qty 1000

## 2016-09-08 MED ORDER — CLOPIDOGREL BISULFATE 300 MG PO TABS
ORAL_TABLET | ORAL | Status: AC
  Filled 2016-09-08: qty 1

## 2016-09-08 MED ORDER — FENTANYL CITRATE (PF) 100 MCG/2ML IJ SOLN
INTRAMUSCULAR | Status: AC
  Filled 2016-09-08: qty 2

## 2016-09-08 MED ORDER — HYDRALAZINE HCL 20 MG/ML IJ SOLN: 5.0000 mg | INTRAMUSCULAR | Status: AC | PRN

## 2016-09-08 MED ORDER — HEPARIN SODIUM (PORCINE) 1000 UNIT/ML IJ SOLN
INTRAMUSCULAR | Status: DC | PRN
  Administered 2016-09-08 (×3): 2000 [IU] via INTRAVENOUS
  Administered 2016-09-08: 9500 [IU] via INTRAVENOUS
  Administered 2016-09-08: 1500 [IU] via INTRAVENOUS

## 2016-09-08 MED ORDER — VERAPAMIL HCL 2.5 MG/ML IV SOLN
INTRAVENOUS | Status: AC
  Filled 2016-09-08: qty 2

## 2016-09-08 MED ORDER — SODIUM CHLORIDE 0.9 % IV SOLN: INTRAVENOUS | Status: AC

## 2016-09-08 MED ORDER — MIDAZOLAM HCL 2 MG/2ML IJ SOLN
INTRAMUSCULAR | Status: DC | PRN
  Administered 2016-09-08 (×3): 0.5 mg via INTRAVENOUS

## 2016-09-08 MED ORDER — ANGIOPLASTY BOOK
Freq: Once | Status: AC
  Administered 2016-09-08: 21:00:00
  Filled 2016-09-08: qty 1

## 2016-09-08 MED ORDER — LIDOCAINE HCL (PF) 1 % IJ SOLN
INTRAMUSCULAR | Status: AC
  Filled 2016-09-08: qty 30

## 2016-09-08 SURGICAL SUPPLY — 25 items
BALLN MOZEC 2.25X14 (BALLOONS) ×2
BALLN ~~LOC~~ MOZEC 3.0X18 (BALLOONS) ×3
BALLN ~~LOC~~ MOZEC 4.0X18 (BALLOONS) ×2
BALLOON MOZEC 2.25X14 (BALLOONS) ×1 IMPLANT
BALLOON ~~LOC~~ MOZEC 3.0X18 (BALLOONS) ×1 IMPLANT
BALLOON ~~LOC~~ MOZEC 4.0X18 (BALLOONS) ×1 IMPLANT
CATH VISTA GUIDE 6FR JR4 (CATHETERS) ×3 IMPLANT
CATH VISTA GUIDE 6FR XBLAD3.0 (CATHETERS) ×3 IMPLANT
CATH VISTA GUIDE 6FR XBLAD3.5 (CATHETERS) ×3 IMPLANT
FEM STOP ARCH (HEMOSTASIS) ×2
GLIDESHEATH SLEND A-KIT 6F 22G (SHEATH) ×3 IMPLANT
HOVERMATT SINGLE USE (MISCELLANEOUS) ×3 IMPLANT
KIT ENCORE 26 ADVANTAGE (KITS) ×6 IMPLANT
KIT HEART LEFT (KITS) ×3 IMPLANT
PACK CARDIAC CATHETERIZATION (CUSTOM PROCEDURE TRAY) ×3 IMPLANT
SET INTRODUCER MICROPUNCT 5F (INTRODUCER) ×3 IMPLANT
SHEATH PINNACLE 6F 10CM (SHEATH) ×3 IMPLANT
STENT PROMUS PREM MR 3.5X24 (Permanent Stent) ×3 IMPLANT
STENT PROMUS PREM MR 3.5X8 (Permanent Stent) ×3 IMPLANT
STENT SYNERGY DES 2.75X28 (Permanent Stent) ×3 IMPLANT
SYSTEM COMPRESSION FEMOSTOP (HEMOSTASIS) ×1 IMPLANT
TRANSDUCER W/STOPCOCK (MISCELLANEOUS) ×3 IMPLANT
TUBING CIL FLEX 10 FLL-RA (TUBING) ×3 IMPLANT
WIRE ASAHI PROWATER 180CM (WIRE) ×3 IMPLANT
WIRE EMERALD 3MM-J .035X150CM (WIRE) ×3 IMPLANT

## 2016-09-08 NOTE — Care Management Note (Addendum)
Case Management Note  Patient Details  Name: Verdean Murin MRN: [id number] Date of Birth: May 26, 1936  Subjective/Objective:   Patient transferred from [place] to [facility], today,  S/p coronary intervention will be on plavix and asa, she lives with spouse at home, she has medication coverage and she has a pcp and she has transport at dc. NCM offered choice for HHPT/HHOT, she chose [facility], referral made to [facility] with [facility].  NCM informed patient that there is usually a co pay with her insurance, she states ok they will work it out.  Will need HHPT/OT orders with face to face.    2/22 [place], BSN - post procedure patient with groin hematoma, H/H dropping, patient transferred to ICU.              Action/Plan:   Expected Discharge Date:  09/01/16               Expected Discharge Plan:  [place]  In-House Referral:     Discharge planning Services  CM Consult  Post Acute Care Choice:    Choice offered to:     DME Arranged:    DME Agency:     HH Arranged:   HHPT, [place] Agency:   [facility]  Status of Service:  In process, will continue to follow  If discussed at Long Length of Stay Meetings, dates discussed:    Additional Comments:  Zenon Mayo, RN 09/08/2016, 12:10 PM

## 2016-09-08 NOTE — Consult Note (Signed)
Consult Note  Patient name: Kristina Howell MRN: [id number] DOB: 05-01-1936 Sex: female  Consulting Physician:  Dr. Pernell Dupre  Reason for Consult:  Chief Complaint  Patient presents with  . Shortness of Breath  . Leg Swelling    HISTORY OF PRESENT ILLNESS: 81 year old female with past medical history significant for Type 2 DM on insulin, persistent afib since 2015, morbid obesity, HTN, gout, and pancreatitis who presented to [facility] ED on the evening of 08/29/2016 for worsening shortness of breath and chest pain.  She is  s/p heart cath and PCI this am who has developed a right femoral hematoma.  Manual pressure is being held.  She has been on Eliquis, but that stopped 3 days ago.  She was given ASA and Plavix today.  Past Medical History:  Diagnosis Date  . A-fib ([place])   . Diabetes mellitus without complication ([place])   . Gout   . Hypertension   . Pancreatitis, recurrent ([place])     Past Surgical History:  Procedure Laterality Date  . CHOLECYSTECTOMY    . CORONARY STENT INTERVENTION N/A 09/08/2016   Procedure: Coronary Stent Intervention;  Surgeon: Belva Crome, MD;  Location: [facility];  Service: Cardiovascular;  Laterality: N/A;  . LEFT HEART CATH AND CORONARY ANGIOGRAPHY N/A 09/03/2016   Procedure: Left Heart Cath and Coronary Angiography;  Surgeon: Nelva Bush, MD;  Location: [facility];  Service: Cardiovascular;  Laterality: N/A;  . TONSILLECTOMY      Social History   Social History  . Marital status: Married    Spouse name: N/A  . Number of children: N/A  . Years of education: N/A   Occupational History  . Not on file.   Social History Main Topics  . Smoking status: Never Smoker  . Smokeless tobacco: Never Used  . Alcohol use No  . Drug use: No  . Sexual activity: Not on file   Other Topics Concern  . Not on file   Social History Narrative  . No narrative on file    Family History  Problem Relation Age of Onset  . Heart  disease Father     Allergies as of 08/29/2016  . (No Known Allergies)    No current facility-administered medications on file prior to encounter.    No current outpatient prescriptions on file prior to encounter.     REVIEW OF SYSTEMS: Cardiovascular: right groin pain Pulmonary: SOB Neurologic: No weakness, paresthesias, aphasia, or amaurosis. No dizziness. Hematologic: No bleeding problems or clotting disorders. Musculoskeletal: No joint pain or joint swelling. Gastrointestinal: No blood in stool or hematemesis Genitourinary: No dysuria or hematuria. Psychiatric:: No history of major depression. Integumentary: right foot uler Constitutional: No fever or chills.  PHYSICAL EXAMINATION: General: The patient appears their stated age.  Vital signs are BP (!) 150/64   Pulse 71   Temp 97.4 F (36.3 C) (Oral)   Resp (!) 21   Ht 5\' 6"  (1.676 m)   Wt 275 lb 3.2 oz (124.8 kg) Comment: scale b  SpO2 95%   BMI 44.42 kg/m  Pulmonary: Respirations are non-labored HEENT:  No gross abnormalities Abdomen: hematoma extends around right flank Musculoskeletal: There are no major deformities.   Neurologic: No focal weakness or paresthesias are detected, Skin: dressing to right foot Psychiatric: The patient has normal affect. Cardiovascular: There is a regular rate and rhythm.  Large hematoma to right groin which is very tender  Diagnostic Studies: U/s shows no pseudoaneurysm.  I was present for the exam   Assessment:  Right femoral hematoma Plan: No current active extravasation, therefore would not recommend surgical exploration Fem-Stop placed  Serial Hb , now, 8 pm, and in am Repeat u/s in am to confirm no further bleeding    V. Leia Alf, M.D. [facility] Office: [phone] Pager:  [phone]

## 2016-09-08 NOTE — Progress Notes (Signed)
   No chest pain.  BP 138/72 mmHg, HR 90 bpm  Appears pale. Large right flank and RLQ hematoma. Not significantly different than around 3 PM when I was called. Fem Stop in place.  Hemoglobin has dropped 2 grams since early this AM. Now 9.6 from 11.5 this AM prior to procedure.  Creat already up to 2.12.  Impression is that of cath site hematoma with significant blood loss. Developed around the time of sheath pull. Hemodynamically stable at this time.  Plan follow H and H; Remove Fem-Stop; Continue gentle hydration unless dyspnea.  Spoke to patient and family at length concerrnig management and clinical issues. Specifically rising creatinine, bleeding and correlation with poor outcome, and risk of stent thrombosis and stroke as we pause anticoagulation to avoid aggravating the bleeding problem.

## 2016-09-08 NOTE — Interval H&P Note (Signed)
History and Physical Interval Note:  09/08/2016 7:37 AM  Medical records were extensively reviewed. The patient has a complex high risk indicated procedure (CHIP). Risk includes age, frailty, chronic kidney disease, long-standing diabetes, physical deconditioning, and severe diffuse double vessel coronary disease. She has been deemed a nonsurgical candidate by colleagues. I discussed that with the patient this morning. We discussed the possibility of acute occlusion and possible emergency surgery. She will like to avoid surgery if at all possible. We discussed the difficulty with respiratory function and inability to provide sedation on the last procedure because of hypoxia. I believe this would be improved on this occasion as she has had significant diuresis. I believe the greatest risk will be kidney injury since we are attempting to intervene on 2 vessels. Previous difficulty with radial access. Radial pulse is present and will be reattempted on this occasion.Cath Lab Visit (complete for each Cath Lab visit)  Clinical Evaluation Leading to the Procedure:   ACS: Yes.    Non-ACS:    Anginal Classification: CCS IV  Anti-ischemic medical therapy: Maximal Therapy (2 or more classes of medications)  Non-Invasive Test Results: No non-invasive testing performed  Prior CABG: No previous CABG        Anhelica Kellison  has presented today for surgery, with the diagnosis of cad  The various methods of treatment have been discussed with the patient and family. After consideration of risks, benefits and other options for treatment, the patient has consented to  Procedure(s): Coronary Stent Intervention (N/A) as a surgical intervention .  The patient's history has been reviewed, patient examined, no change in status, stable for surgery.  I have reviewed the patient's chart and labs.  Questions were answered to the patient's satisfaction.     Belva Crome III

## 2016-09-08 NOTE — Progress Notes (Signed)
Called to see patient concerning right groin hematoma.  RN holding pressure on right groin hematoma on arrival and Dr. Trula Slade followed in behind me.  Additional pressure held x 30 minutes and a vascular ultrasound was obtained.  No obvious bleed into the hematoma from the stick site.  A fem stop device was placed per Dr. Stephens Shire request.  Pressure set at 50.  RN will continue to monitor site.

## 2016-09-08 NOTE — Progress Notes (Signed)
Patient received from cath lab at 1100, Right groin site had a swollen area superior to site, ? Hematoma, checked q 15 til 1200. No change noted in site. 1300 noted bruised area lateral to site extended from site to hip, about 1 inch x 5-6 inches. Area hard and tender. ACT 186, rechecked and less that 175. Sheath pulled at 1400 and pressure held for 30 minutes. 1405 pressure dropped to 79/41. Legs elevated and bolus of 250 mls NS given. 1415 BP 107/53. BP stable remainder of time pressure held. No change in lateral hematoma noted. Small hematoma superior and medial to site noted less then 2 cm from site.  Dr. Tamala Julian notified and up to see patient. Noted large area on right side above previous hematoma, RLQ of abdomen, right hip, hard and tender to touch. Consulted Dr. Trula Slade, vascular, to assess groin site and notified cath lab to assist in holding pressure on groin site.  Kristina Howell from cath lab and Dr. Trula Slade into see patient, vascular study done at bedside, femostop applied after ascular study determined that no active bleed was noted. Area of hematoma marked and closely monitored with no noted change. Pressure on femostop remained at 50-55 mmHg as ordered.  Report to Kristina Erm, RN and assessed site together at change of shift. Kristina Howell into see patient and readjusted femostop slightly lateral. No change noted.  Dr. Tamala Julian into see patient and reassess for any changes. Stated he did not notice a change in assessment. Spoke to patient and family.

## 2016-09-08 NOTE — Progress Notes (Signed)
Family Medicine Teaching Service Daily Progress Note Intern Pager: [phone]  Patient name: Kristina Howell Medical record number: [id number] Date of birth: Sep 21, 1935 Age: 81 y.o. Gender: female  Primary Care Provider: Jeri Modena Consultants: Cardiology  Code Status: full  Pt Overview and Major Events to Date:  08/29/2016: Admit to FPTS 09/03/2016: Left Heart Cath -- Multivessel CAD, including 90% mid LAD stenosis involving small diagonal branch, 70% ostial/proximal ramus lesion, 30% OM stenosis and sequential 70-80% proximal/mid RCA stenoses 09/08/2016: PCI  Assessment and Plan: Kristina Scottonis a 81 y.o.femalepresenting with worsening dyspnea. PMH is significant for T2DM on insulin, persistent afib, morbid obesity, HTN, gout, and pancreatitis.   new CHF diagnosis, stable: Still hypervolemic. Net -11.4L since admit. Daily wts 280 lbs >272>275 (Dry weight 270).  Monitoring creatinine, 1.84 from 1.78 yesterday. - Plan for stage PCI to mid LAD and proximal/mid RCA today - Will f/u cardiology recommendations after procedure - restart diuresis tomorrow  T2DM, stable:  Home lantus 20u nightly. CBG 263, 278 and required 21u SSI over last 24h with 10u lantus last night (reduced dose in anticipation of NPO for cardiology procedure today. - CBGs q4H while NPO - Restart lantus 20u QHS once eating after procedure - resistant sliding scale  HTN, stable - hypertensive at 157/56 this morning - hold benazepril, consider switching to losartan at DC for uric acid lowering properties - cont imdur, hydralazine 25 mg TID, metoprolol XL 50 mg, norvasc 10 mg (new this admit while holding ACE) - monitor BP  AKI, worseSlight worsening. SCr 1.64 > 2.05 >1.84Baseline may be ~ 1.0 (2015). Related to CHF exacerbation and diuresis.  - Hold home ACE until SCr improves - continue to monitor   Hypercholesterolemia, stable LDL 180 - cont Crestor 20 mg qd  R foot ulcer, stable Callused, does  not appear infected. - Consult WOC while inpatient  Persistent afib, stable Rate controlled - cont heparin gtt in preparation for re-cath; resume home eliquis afterwards - cont home toprol 50 mg  Hx of gout, stable  Continue home allopurinol  FEN/GI: Heart healthy/carb modified, SLIV Prophylaxis: On eliquis  Disposition: PT recommends home health PT, supervision for mobility/OOB   Subjective:  Patient in cath. Will addend note later.  Objective: Temp:  [98.2 F (36.8 C)-98.3 F (36.8 C)] 98.2 F (36.8 C) (02/21 0407) Pulse Rate:  [65-84] 70 (02/21 0407) Resp:  [17-18] 18 (02/21 0407) BP: (133-158)/(50-57) 157/56 (02/21 0407) SpO2:  [98 %-100 %] 98 % (02/21 0839) Weight:  [275 lb 3.2 oz (124.8 kg)] 275 lb 3.2 oz (124.8 kg) (02/21 0407) Physical Exam: Patient getting cath procedure this AM. Will go back to see the patient later.  Laboratory:  Recent Labs Lab 09/06/16 1552 09/07/16 0552 09/08/16 0336  WBC 8.4 8.0 8.2  HGB 13.1 11.9* 11.5*  HCT 40.0 37.4 36.8  PLT 238 231 221    Recent Labs Lab 09/03/16 0031  09/06/16 1552 09/07/16 0552 09/08/16 0336  NA 136  < > 137 138 136  K 3.8  < > 3.7 3.7 4.0  CL 103  < > 98* 100* 101  CO2 23  < > 27 28 27   BUN 42*  < > 39* 36* 32*  CREATININE 1.82*  < > 2.08* 1.78* 1.84*  CALCIUM 8.6*  < > 9.4 9.3 9.1  PROT 5.5*  --   --   --   --   BILITOT 0.4  --   --   --   --   ALKPHOS 79  --   --   --   --  ALT 25  --   --   --   --   AST 24  --   --   --   --   GLUCOSE 262*  < > 295* 165* 210*  < > = values in this interval not displayed.  Imaging/Diagnostic Tests: No results found.   Everrett Coombe, MD 09/08/2016, 8:55 AM PGY-1, [place] Intern pager: [phone], text pages welcome

## 2016-09-08 NOTE — CV Procedure (Signed)
   CHIP with multivessel diffuse CAD involving the mid LAD and proximal to mid RCA, chronic kidney failure stage IV, hypertension, obesity, and frailty.  Complexity of the procedure was complicated by difficulty with left coronary guide seating. This led to more contrast during LAD intervention than anticipated.  PCI and stent of LAD using 28 x 2.75 Synergy, post dilated to 3.0. 95% stenosis reduced to 0%. TIMI-3 flow.  Proximal to mid 90% RCA treated with 28 x 3.5 Promus Premier and overlapping 8 x 3.5 Promus Premier proximally postdilated to 4.0 mm. Reduced to 0% with TIMI grade 3 flow.  Initial attempts to perform the procedure via the radial was not possible due to difficulty advancing micropuncture wire.  Right femoral approach using micropuncture kit was performed with a single anterior wall stick. No hematoma or complications.  Total contrast 160 cc.

## 2016-09-08 NOTE — Progress Notes (Signed)
*  PRELIMINARY RESULTS* Vascular Ultrasound Pseudoaneurysm check: preliminary results- no obvious pseudoaneurysm seen at this time, large pocket of complex fluid noted.  Everrett Coombe 09/08/2016, 3:52 PM

## 2016-09-08 NOTE — Progress Notes (Signed)
PT Cancellation Note  Patient Details Name: Kristina Howell MRN: [id number] DOB: 1936-05-31   Cancelled Treatment:    Reason Eval/Treat Not Completed: Patient at procedure or test/unavailable. Pt off of the floor for a procedure (cath lab). PT will continue to f/u with pt as available and appropriate.    [place] 09/08/2016, 8:11 AM

## 2016-09-08 NOTE — Progress Notes (Signed)
   Called by the nurse to evaluate right groin. Groin pull was completed greater than an hour ago.  Hugh right lateral lower abdominal wall flank and hip region hematoma. This is new compared to post procedure. Hematomas large and tense. Patient is very uncomfortable.  Distal pulses are intact.  No significant back discomfort.  Impression: Massive procedure related right groin and lower flank hemorrhage. Etiology is intense anticoagulation and poor tissue turgor. Because hematomas so large atorvastatin team to hold pressure.  Recommendation: Emergency vascular surgical consult to determine if evacuation as needed or if watchful waiting is appropriate.

## 2016-09-08 NOTE — Progress Notes (Signed)
Called cath lab this morning regarding pt beta blockers and 1000 medications, cath lab RN stated to hold 1000 medications. Pt taken down to cath lab for procedure. Heparin stopped when transporter arrived. Pt family members took all belongings.   Kristina Howell

## 2016-09-08 NOTE — Progress Notes (Signed)
FPTS Interim Progress Note  S:Went to check on patient to see how she was doing after her procedure (stent placement) this morning. She was resting in bed. She had dropped her pressure prior to me entering the room and nursing protocol was underway. Nitro had been stopped and patient was receiving a bolus. Systolic BP improving at ~950-722 while I was in the room. Patient denies any pain currently, though uncomfortable due to ecchymosis and hematoma at cath site.  Vitals now stable.  O: BP (!) 150/64   Pulse 71   Temp 97.4 F (36.3 C) (Oral)   Resp (!) 21   Ht 5\' 6"  (1.676 m)   Wt 275 lb 3.2 oz (124.8 kg) Comment: scale b  SpO2 95%   BMI 44.42 kg/m   GEN: Patient pale but in no acute distress Card: RRR, no m/r/g Pulm; CTA bil EXT: 2+ pitting Edema LE Bil Skin: warm and well-perfused, +ecchymosis and hematoma in R groin s/p cath Psych: AAOx3  A/P: Plan per progress note from earlier today. Will continue to follow up cardiology recommendations.  Everrett Coombe, MD 09/08/2016, 2:31 PM PGY-1, [place] Medicine Service pager [phone]

## 2016-09-08 NOTE — H&P (View-Only) (Signed)
Progress Note  Patient Name: Kristina Howell Date of Encounter: 09/07/2016  Primary Cardiologist:  [facility] 2015  Subjective   Patient denies CP  Breathing is better    Inpatient Medications    Scheduled Meds: . allopurinol  300 mg Oral Daily  . amLODipine  10 mg Oral Daily  . hydrALAZINE  25 mg Oral Q8H  . insulin aspart  0-20 Units Subcutaneous TID WC  . insulin aspart  0-5 Units Subcutaneous QHS  . insulin glargine  20 Units Subcutaneous QHS  . isosorbide mononitrate  30 mg Oral Daily  . metoprolol succinate  50 mg Oral Daily  . rosuvastatin  20 mg Oral q1800  . silver sulfADIAZINE   Topical Daily  . sodium chloride flush  3 mL Intravenous Q12H  . sodium chloride flush  3 mL Intravenous Q12H   Continuous Infusions: . heparin 1,450 Units/hr (09/06/16 1536)   PRN Meds: sodium chloride, sodium chloride, acetaminophen, hydrALAZINE, ondansetron (ZOFRAN) IV, sodium chloride flush, sodium chloride flush   Vital Signs    Vitals:   09/06/16 1130 09/06/16 1133 09/06/16 2113 09/07/16 0400  BP: (!) 188/54 (!) 150/65 (!) 165/61 136/62  Pulse: 80  72 69  Resp: 18  18 18   Temp: 98.3 F (36.8 C)  98.1 F (36.7 C) 97.8 F (36.6 C)  TempSrc: Oral  Oral Oral  SpO2: 99%  99% 99%  Weight:    272 lb 14.4 oz (123.8 kg)  Height:        Intake/Output Summary (Last 24 hours) at 09/07/16 0749 Last data filed at 09/07/16 0500  Gross per 24 hour  Intake            856.5 ml  Output             2750 ml  Net          -1893.5 ml   I/O   -11.75 L   Filed Weights   09/05/16 0457 09/06/16 0417 09/07/16 0400  Weight: 275 lb 12.8 oz (125.1 kg) 274 lb 8 oz (124.5 kg) 272 lb 14.4 oz (123.8 kg)    Telemetry   Afib   Rates  80s    ECG      Physical Exam   GEN: No acute distress.   Neck: No JVD Cardiac: RRR, no murmurs, rubs, or gallops.  Respiratory: Clear to auscultation bilaterally. GI: Soft, nontender, non-distended  MS: 1+ edema; No deformity. Neuro:  Nonfocal  Psych:  Normal affect   Labs    Chemistry Recent Labs Lab 09/03/16 0031  09/06/16 1003 09/06/16 1552 09/07/16 0552  NA 136  < > 137 137 138  K 3.8  < > 3.7 3.7 3.7  CL 103  < > 100* 98* 100*  CO2 23  < > 25 27 28   GLUCOSE 262*  < > 258* 295* 165*  BUN 42*  < > 37* 39* 36*  CREATININE 1.82*  < > 1.92* 2.08* 1.78*  CALCIUM 8.6*  < > 9.2 9.4 9.3  PROT 5.5*  --   --   --   --   ALBUMIN 2.5*  --   --   --   --   AST 24  --   --   --   --   ALT 25  --   --   --   --   ALKPHOS 79  --   --   --   --   BILITOT 0.4  --   --   --   --  GFRNONAA 25*  < > 24* 22* 26*  GFRAA 30*  < > 28* 25* 30*  ANIONGAP 10  < > 12 12 10   < > = values in this interval not displayed.   Hematology Recent Labs Lab 09/05/16 0311 09/06/16 1552 09/07/16 0552  WBC 8.8 8.4 8.0  RBC 4.29 4.54 4.23  HGB 12.0 13.1 11.9*  HCT 38.0 40.0 37.4  MCV 88.6 88.1 88.4  MCH 28.0 28.9 28.1  MCHC 31.6 32.8 31.8  RDW 15.1 15.0 15.4  PLT 271 238 231    Cardiac EnzymesNo results for input(s): TROPONINI in the last 168 hours. No results for input(s): TROPIPOC in the last 168 hours.   BNPNo results for input(s): BNP, PROBNP in the last 168 hours.   DDimer No results for input(s): DDIMER in the last 168 hours.   Radiology    No results found.  Cardiac Studies       Cath (09/03/16):  1. Multivessel CAD, including 90% mid LAD stenosis involving small diagonal branch, 70% ostial/proximal ramus lesion, 30% OM stenosis and sequential 70-80% proximal/mid RCA stenoses. 2. Mildly elevated left ventricular filling pressure. 3. Mild to moderate aortic valve gradient, which is suboptimally evaluated due to heart rate variability related to atrial fibrillation. 4. Right radial artery stenosis, which is likely a combination of fixed disease and vasospasm. Stenosis was successfully navigated with angled micropuncture and Versacore wires.  Recommendations: 1. Medical optimization, including further diuresis and close monitoring  of renal function. Plan for stage PCI to mid LAD and proximal/mid RCA next week when volume status has improved and renal function has improved/stabilized    Patient Profile     81 y.o. female with history of CAD and systolic CHF   Cath last week with signif 2 V CAD   Recomm optimazation of fluid status and then staged PCI  Pt has diuresed significantly  REnal function is improved from yestrea   Assessment & Plan    1  CAD  Plan for PCI tomorrow.    2  Acute on chronic systolic CHF  LVEF 03% with severe hypokinesis of mid anteroseptal, apical septla apical wall    RVEF normal   Volume status improved since last week with signif diuresis  Pt comfortable though still with vol increase    3  CKD stage III  Cr 1.7  Near baselinie  WIll hold f  Signed, Dorris Carnes, MD  09/07/2016, 7:49 AM

## 2016-09-09 ENCOUNTER — Inpatient Hospital Stay (HOSPITAL_COMMUNITY): Payer: PPO

## 2016-09-09 DIAGNOSIS — R609 Edema, unspecified: Secondary | ICD-10-CM

## 2016-09-09 LAB — BASIC METABOLIC PANEL
ANION GAP: 11 (ref 5–15)
BUN: 38 mg/dL — ABNORMAL HIGH (ref 6–20)
CHLORIDE: 103 mmol/L (ref 101–111)
CO2: 22 mmol/L (ref 22–32)
Calcium: 8.7 mg/dL — ABNORMAL LOW (ref 8.9–10.3)
Creatinine, Ser: 2.32 mg/dL — ABNORMAL HIGH (ref 0.44–1.00)
GFR calc non Af Amer: 19 mL/min — ABNORMAL LOW (ref >60)
GFR, EST AFRICAN AMERICAN: 22 mL/min — AB (ref >60)
GLUCOSE: 213 mg/dL — AB (ref 65–99)
POTASSIUM: 4.6 mmol/L (ref 3.5–5.1)
Sodium: 136 mmol/L (ref 135–145)

## 2016-09-09 LAB — CBC WITH DIFFERENTIAL/PLATELET
BASOS ABS: 0 10*3/uL (ref 0.0–0.1)
BASOS PCT: 0 %
Eosinophils Absolute: 0 10*3/uL (ref 0.0–0.7)
Eosinophils Relative: 0 %
HEMATOCRIT: 27.8 % — AB (ref 36.0–46.0)
HEMOGLOBIN: 9 g/dL — AB (ref 12.0–15.0)
LYMPHS PCT: 10 %
Lymphs Abs: 1.3 10*3/uL (ref 0.7–4.0)
MCH: 28.5 pg (ref 26.0–34.0)
MCHC: 32.4 g/dL (ref 30.0–36.0)
MCV: 88 fL (ref 78.0–100.0)
Monocytes Absolute: 0.7 10*3/uL (ref 0.1–1.0)
Monocytes Relative: 6 %
NEUTROS PCT: 84 %
Neutro Abs: 11.1 10*3/uL — ABNORMAL HIGH (ref 1.7–7.7)
Platelets: 273 10*3/uL (ref 150–400)
RBC: 3.16 MIL/uL — AB (ref 3.87–5.11)
RDW: 15.2 % (ref 11.5–15.5)
WBC: 13.2 10*3/uL — ABNORMAL HIGH (ref 4.0–10.5)

## 2016-09-09 LAB — CBC
HEMATOCRIT: 26.2 % — AB (ref 36.0–46.0)
HEMATOCRIT: 23.3 % — AB (ref 36.0–46.0)
HEMOGLOBIN: 8.5 g/dL — AB (ref 12.0–15.0)
HEMOGLOBIN: 7.5 g/dL — AB (ref 12.0–15.0)
MCH: 28.4 pg (ref 26.0–34.0)
MCH: 28.4 pg (ref 26.0–34.0)
MCHC: 32.4 g/dL (ref 30.0–36.0)
MCHC: 32.2 g/dL (ref 30.0–36.0)
MCV: 87.6 fL (ref 78.0–100.0)
MCV: 88.3 fL (ref 78.0–100.0)
Platelets: 264 10*3/uL (ref 150–400)
Platelets: 226 10*3/uL (ref 150–400)
RBC: 2.99 MIL/uL — AB (ref 3.87–5.11)
RBC: 2.64 MIL/uL — ABNORMAL LOW (ref 3.87–5.11)
RDW: 15.4 % (ref 11.5–15.5)
RDW: 16 % — ABNORMAL HIGH (ref 11.5–15.5)
WBC: 12.6 10*3/uL — ABNORMAL HIGH (ref 4.0–10.5)
WBC: 13.4 10*3/uL — ABNORMAL HIGH (ref 4.0–10.5)

## 2016-09-09 LAB — GLUCOSE, CAPILLARY
GLUCOSE-CAPILLARY: 217 mg/dL — AB (ref 65–99)
GLUCOSE-CAPILLARY: 276 mg/dL — AB (ref 65–99)
Glucose-Capillary: 171 mg/dL — ABNORMAL HIGH (ref 65–99)
Glucose-Capillary: 290 mg/dL — ABNORMAL HIGH (ref 65–99)

## 2016-09-09 LAB — ABO/RH: ABO/RH(D): A POS

## 2016-09-09 LAB — MRSA PCR SCREENING: MRSA BY PCR: NEGATIVE

## 2016-09-09 LAB — PREPARE RBC (CROSSMATCH)

## 2016-09-09 LAB — HEMOGLOBIN AND HEMATOCRIT, BLOOD
HCT: 27.1 % — ABNORMAL LOW (ref 36.0–46.0)
Hemoglobin: 8.7 g/dL — ABNORMAL LOW (ref 12.0–15.0)

## 2016-09-09 MED ORDER — SODIUM CHLORIDE 0.9 % IV SOLN
Freq: Once | INTRAVENOUS | Status: AC
  Administered 2016-09-09: 17:00:00 via INTRAVENOUS

## 2016-09-09 MED ORDER — INSULIN GLARGINE 100 UNIT/ML ~~LOC~~ SOLN
15.0000 [IU] | Freq: Every day | SUBCUTANEOUS | Status: DC
  Administered 2016-09-09 – 2016-09-10 (×2): 15 [IU] via SUBCUTANEOUS
  Filled 2016-09-09 (×2): qty 0.15

## 2016-09-09 MED FILL — Verapamil HCl IV Soln 2.5 MG/ML: INTRAVENOUS | Qty: 2 | Status: AC

## 2016-09-09 NOTE — Progress Notes (Signed)
Progress Note  Patient Name: Kristina Howell Date of Encounter: 09/09/2016  Primary Cardiologist: Hamilton Capri at [place], here Dr [place]  Patient Profile     81 y.o. female w/ hx Type 2 DM on insulin, persistent afib since 2015, morbid obesity, HTN, gout, CKD IV, and pancreatitis was admitted 08/29/2016 w/ CHF, CP. Cath w/ multi-vessel dz, s/p staged DES LAD & RCA 02/21, groin bleed after procedure, VVS saw, Fem-Stop placed, repeat US pending, H&H continues to drop  Subjective   Good pain control right now, no CP or SOB.  Inpatient Medications    Scheduled Meds: . allopurinol  300 mg Oral Daily  . amLODipine  10 mg Oral Daily  . aspirin  81 mg Oral Daily  . clopidogrel  75 mg Oral Q breakfast  . hydrALAZINE  25 mg Oral Q8H  . insulin aspart  0-20 Units Subcutaneous TID WC  . insulin aspart  0-5 Units Subcutaneous QHS  . isosorbide mononitrate  30 mg Oral Daily  . metoprolol succinate  50 mg Oral Daily  . rosuvastatin  20 mg Oral q1800  . silver sulfADIAZINE   Topical Daily  . sodium chloride flush  3 mL Intravenous Q12H   Continuous Infusions:  PRN Meds: sodium chloride, acetaminophen, HYDROcodone-acetaminophen, ondansetron (ZOFRAN) IV, sodium chloride flush   Vital Signs    Vitals:   09/09/16 0400 09/09/16 0500 09/09/16 0600 09/09/16 0700  BP: (!) 144/46 (!) 170/41 (!) 142/49   Pulse: 92     Resp: (!) 27 (!) 23 (!) 27   Temp: 98.2 F (36.8 C)   98.4 F (36.9 C)  TempSrc: Oral   Axillary  SpO2: 97% 98% 97%   Weight: 275 lb 2.2 oz (124.8 kg)     Height:        Intake/Output Summary (Last 24 hours) at 09/09/16 0843 Last data filed at 09/09/16 0600  Gross per 24 hour  Intake          1683.04 ml  Output              750 ml  Net           933.04 ml   Filed Weights   09/07/16 0400 09/08/16 0407 09/09/16 0400  Weight: 272 lb 14.4 oz (123.8 kg) 275 lb 3.2 oz (124.8 kg) 275 lb 2.2 oz (124.8 kg)    Telemetry    Atrial fib, HR down to 40s once overnight,  generally < 100 - Personally Reviewed  ECG    Atrial fib, T wave inversions lateral leads are old, Inferior T wave inversions are new - Personally Reviewed  Physical Exam   General: Well developed, well nourished, female appearing in no acute distress. Head: Normocephalic, atraumatic.  Neck: Supple without bruits, JVD not elevated. Lungs:  Resp regular and unlabored, CTA. Heart: Irre R&R, S1, S2, no S3, S4, 2/6 murmur; no rub. Abdomen: Soft, non-tender, non-distended with normoactive bowel sounds. No hepatomegaly. No rebound/guarding. No obvious abdominal masses. Large groin/abd hematoma, softening of the most anterior edges. Extremities: No clubbing, cyanosis, no edema. Distal pedal pulses are 2+ bilaterally. Neuro: Alert and oriented X 3. Moves all extremities spontaneously. Psych: Normal affect.  Labs    Hematology Recent Labs Lab 09/08/16 2020 09/09/16 0006 09/09/16 0427  WBC 17.1* 13.2* 12.6*  RBC 3.38* 3.16* 2.99*  HGB 9.6* 9.0* 8.5*  HCT 29.9* 27.8* 26.2*  MCV 88.5 88.0 87.6  MCH 28.4 28.5 28.4  MCHC 32.1 32.4 32.4  RDW 15.2 15.2 15.4  PLT 281 273 264    Chemistry Recent Labs Lab 09/03/16 0031  09/08/16 0336 09/08/16 2020 09/09/16 0427  NA 136  < > 136 136 136  K 3.8  < > 4.0 5.0 4.6  CL 103  < > 101 105 103  CO2 23  < > 27 21* 22  GLUCOSE 262*  < > 210* 269* 213*  BUN 42*  < > 32* 35* 38*  CREATININE 1.82*  < > 1.84* 2.14* 2.32*  CALCIUM 8.6*  < > 9.1 8.8* 8.7*  PROT 5.5*  --   --   --   --   ALBUMIN 2.5*  --   --   --   --   AST 24  --   --   --   --   ALT 25  --   --   --   --   ALKPHOS 79  --   --   --   --   BILITOT 0.4  --   --   --   --   GFRNONAA 25*  < > 25* 21* 19*  GFRAA 30*  < > 29* 24* 22*  ANIONGAP 10  < > _0 < > = values in this interval not displayed.   Cardiac Enzymes Lab Results  Component Value Date   TROPONINI 0.05 ([place]) 08/30/2016    Radiology    No results found.   Cardiac Studies   CATH: 09/08/2016  CHIP  with multivessel diffuse CAD involving the mid LAD and proximal to mid RCA, chronic kidney failure stage IV, hypertension, obesity, and frailty.  Complexity of the procedure was complicated by difficulty with left coronary guide seating. This led to more contrast during LAD intervention than anticipated.  PCI and stent of LAD using 28 x 2.75 Synergy, post dilated to 3.0. 95% stenosis reduced to 0%. TIMI-3 flow.  Proximal to mid 90% RCA treated with 28 x 3.5 Promus Premier and overlapping 8 x 3.5 Promus Premier proximally postdilated to 4.0 mm. Reduced to 0% with TIMI grade 3 flow.  Initial attempts to perform the procedure via the radial was not possible due to difficulty advancing micropuncture wire.  Right femoral approach using micropuncture kit was performed with a single anterior wall stick. No hematoma or complications.  Total contrast 160 cc.  Patient Profile     81 y.o. female w/ hx Type 2 DM on insulin, persistent afib since 2015, morbid obesity, HTN, gout, CKD IV, and pancreatitis was admitted 08/29/2016 w/ CHF, CP. Cath w/ multi-vessel dz, s/p staged DES LAD & RCA 02/21, groin bleed after procedure, VVS saw, Fem-Stop placed, repeat US pending, H&H continues to drop  Assessment & Plan    1.  CAD: s/p PCI 02/21 to LAD & RCA - on ASA, Plavix (curbsided Dr Burt Knack, continue both for now) - on BB, statin  2  Acute on chronic systolic CHF  LVEF 85% with severe hypokinesis of mid anteroseptal, apical septla apical wall    RVEF normal by echo. -  Volume status improved since last week with signif diuresis, I/O neg 10 L since admit, wt down 15 lbs -  Pt comfortable though still with mild vol increase, no respiratory distress  3  CKD stage III - Baseline Cr around 1.0, Cr 1.7 on admit, now 2.32 - follow, per IM  4. Atrial fib, permanent - rate generally ok - some bradycardia overnight, asymptomatic - continue BB at current dose.  5. Chronic anticoag -  CHA2DS2VASc=7 (HTN, CHF,  age (76), female, CAD, DM).  - pt has been off Eliquis (dose decreased to 2.5 mg bid) since 02/13 - off heparin since 02/19 - keep off anticoag for at least a week.  6. Groin hematoma - at cath site, H&H have been trending down, recheck pending at 2 pm  Otherwise, per IM Principal Problem:   Acute systolic heart failure (HCC) Active Problems:   Peripheral edema   Diabetes mellitus ([place])   Persistent atrial fibrillation (HCC)   Morbidly obese (HCC)   Type 2 diabetes mellitus with diabetic foot ulcer ([place])   Acute kidney injury ([place])   Dyspnea   Unstable angina (HCC)   Chronic anticoagulation   Coronary artery disease due to lipid rich plaque   Coronary artery disease involving native coronary artery of native heart with unstable angina pectoris [facility])   Signed, Lenoard Aden 8:43 AM 09/09/2016 Pager: [phone]   Patient seen and examined.  I agree with findings as noted by R Barrett   Pt comfortable  Lungs are CTA   Cardiac RRR  N o S3  Ext  R gri marked  Will need to follow  H/H have trended down  Need to follow later today  Transfer step down  Watch I/O closely.    Dorris Carnes

## 2016-09-09 NOTE — Progress Notes (Signed)
Family Medicine Teaching Service Daily Progress Note Intern Pager: [phone]  Patient name: Kristina Howell Medical record number: [id number] Date of birth: 11/30/1935 Age: 81 y.o. Gender: female  Primary Care Provider: Jeri Modena Consultants: Cardiology  Code Status: full  Pt Overview and Major Events to Date:  08/29/2016: Admit to FPTS 09/03/2016: Left Heart Cath -- Multivessel CAD, including 90% mid LAD stenosis involving small diagonal branch, 70% ostial/proximal ramus lesion, 30% OM stenosis and sequential 70-80% proximal/mid RCA stenoses 09/08/2016: PCI  Assessment and Plan: Kristina Scottonis a 81 y.o.femalepresenting with worsening dyspnea. PMH is significant for T2DM on insulin, persistent afib, morbid obesity, HTN, gout, and pancreatitis.   Acute Blood Loss Anemia, new - 2/2 right groin site hematoma after stent placement yesterday. Vascular surgery was called and femoral U/S showed no extravasation yesterday evening. - following cards recs: No heparin, Type and screen, transfuse if Hgb <7.5, track H/H - femoral Vas U/S pending -  Follow with 12pm CBC, 6pm CBC  CHF diagnosis, worse: Hypervolemic. Daily wts 275 (Dry weight 270).  Monitoring creatinine, 2.32 from 8.4 yesterday - hold diuresis in setting of acute bleed and worsening kidney function  T2DM, stable:  Home lantus 20u nightly. CBG 274, 217 overnight. 14u novolg used yesterday - resistant sliding scale - consider inc lantus  Today if patient tolerates food  HTN, acutely hypotensive with blood loss, stable - Normotensive this morning - hold benazepril, consider switching to losartan at DC for uric acid lowering properties - cont imdur, hydralazine 25 mg TID, metoprolol XL 50 mg, norvasc 10 mg (new this admit while holding ACE) - monitor BP  AKI, worseSCr 1.84 > 2.32Baseline may be ~ 1.0 (2015).  Worsened in setting of recent diuresis, CHF, also PCI stent with contrast and acute hypotension episode  yesterday - Hold home ACE until SCr improves - AM BMP  Hypercholesterolemia, stable LDL 180 - cont Crestor 20 mg qd  R foot ulcer, stable Callused, does not appear infected. - Consult WOC while inpatient  Persistent afib, stable Rate controlled - holding heparin for acute bleed, following cards recs - cont home toprol 50 mg  Hx of gout, stable  Continue home allopurinol  FEN/GI: Heart healthy/carb modified, SLIV Prophylaxis: On eliquis  Disposition: PT recommends home health PT, supervision for mobility/OOB   Subjective:  Patient was noted to be hypotensive with a enlarging hematoma in her right groin at the cath site yesterday as documented in interim progress notes. Morning she denies pain, vitals are stable, she is feeling tired due to lack of sleep. She denies shortness of breath or chest pain.  Objective: Temp:  [97.4 F (36.3 C)-98.4 F (36.9 C)] 98.4 F (36.9 C) (02/22 0700) Pulse Rate:  [65-94] 94 (02/22 0700) Resp:  [8-31] 26 (02/22 0700) BP: (75-180)/(33-118) 142/42 (02/22 0700) SpO2:  [94 %-100 %] 98 % (02/22 0700) Weight:  [275 lb 2.2 oz (124.8 kg)] 275 lb 2.2 oz (124.8 kg) (02/22 0400) Physical Exam: General: NAD, sitting up in bed Cardiovascular: RRR, no m/r/g Respiratory: CTA bil, diminished sounds at the lung bases, no W/R/R Abdomen:  +ecchymosis and tender hematoma RLQ, no rigidity, guarding or distension Extremities: 2+ pitting edema noted over shins bilaterally, SCDs in place Derm: chronic hyperpigmentation venous changes over lower leg. Callused region to R plantar eminence below great toe about 2cmx2.5cm.   Laboratory:  Recent Labs Lab 09/08/16 2020 09/09/16 0006 09/09/16 0427  WBC 17.1* 13.2* 12.6*  HGB 9.6* 9.0* 8.5*  HCT 29.9* 27.8* 26.2*  PLT  281 273 264    Recent Labs Lab 09/03/16 0031  09/08/16 0336 09/08/16 2020 09/09/16 0427  NA 136  < > 136 136 136  K 3.8  < > 4.0 5.0 4.6  CL 103  < > 101 105 103  CO2 23  < > 27 21*  22  BUN 42*  < > 32* 35* 38*  CREATININE 1.82*  < > 1.84* 2.14* 2.32*  CALCIUM 8.6*  < > 9.1 8.8* 8.7*  PROT 5.5*  --   --   --   --   BILITOT 0.4  --   --   --   --   ALKPHOS 79  --   --   --   --   ALT 25  --   --   --   --   AST 24  --   --   --   --   GLUCOSE 262*  < > 210* 269* 213*  < > = values in this interval not displayed.  Imaging/Diagnostic Tests: No results found.   Everrett Coombe, MD 09/09/2016, 9:40 AM PGY-1, [place] Intern pager: [phone], text pages welcome

## 2016-09-09 NOTE — Progress Notes (Signed)
Holding ambulation and education right now. Will f/u. Yves Dill CES, ACSM 8:58 AM 09/09/2016

## 2016-09-09 NOTE — Progress Notes (Signed)
   No heparin  Type and screen  If Hgb lower than 7.5, will need transfusion. Track H and H.

## 2016-09-09 NOTE — Progress Notes (Signed)
Interventional Cardiology   Hemoglobin dipped as low as 7.5. Creatinine 2.14 this PM  Received 1 unit packed cells earlier today.  Post transfusion hemoglobin pending.  Does not appear volume overloaded. I recommend resuming lasix tomorrow and have stopped Imdur as coronary arteries are now patent.  Needs to ambulate starting tomorrow.  If hemoglobin continues to fall, will require additional transfusion.  Still very ill and at risk for DVT/PE and Stent Thrombosis.

## 2016-09-09 NOTE — Progress Notes (Signed)
  Physical Therapy Treatment Patient Details Name: Georgeana Oertel MRN: [id number] DOB: 1935/08/17 Today's Date: 09/09/2016    History of Present Illness 81 y.o. female presenting with worsening dyspnea and admitted with CHF exacerbation. PMH is significant for T2DM on insulin, persistent afib, morbid obesity, HTN, gout, and pancreatitis.      PT Comments    Treatment limited to bed exercise due to mobility limitations with femoral hematoma.   Follow Up Recommendations  Home health PT;Supervision for mobility/OOB     Equipment Recommendations  None recommended by PT    Recommendations for Other Services       Precautions / Restrictions      Mobility  Bed Mobility               General bed mobility comments: not able to mobilize  Transfers                    Ambulation/Gait                 Stairs            Wheelchair Mobility    Modified Rankin (Stroke Patients Only)       Balance                                    Cognition Arousal/Alertness: Awake/alert Behavior During Therapy: WFL for tasks assessed/performed Overall Cognitive Status: Within Functional Limits for tasks assessed                      Exercises General Exercises - Lower Extremity Ankle Circles/Pumps: AROM;20 reps;Supine Heel Slides: AROM;Left;10 reps;Supine (graded resistance) Hip ABduction/ADduction: AROM;Left;10 reps;Supine Other Exercises Other Exercises: bil biceps/triceps press x10 reps    General Comments        Pertinent Vitals/Pain Pain Assessment: Faces Faces Pain Scale: Hurts little more Pain Location: general/joint pain Pain Descriptors / Indicators: Aching;Grimacing Pain Intervention(s): Monitored during session    Home Living                      Prior Function            PT Goals (current goals can now be found in the care plan section) Acute Rehab PT Goals Patient Stated Goal: get home to usual  activities PT Goal Formulation: With patient Time For Goal Achievement: 09/13/16 Potential to Achieve Goals: Good Progress towards PT goals: Not progressing toward goals - comment (limited to bed exercises due to femoral hematoma)    Frequency    Min 3X/week      PT Plan Current plan remains appropriate    Co-evaluation             End of Session   Activity Tolerance: Patient tolerated treatment well Patient left: in bed;with call bell/phone within reach;with family/visitor present Nurse Communication: Mobility status PT Visit Diagnosis: Difficulty in walking, not elsewhere classified (R26.2)     Time: 8299-3716 PT Time Calculation (min) (ACUTE ONLY): 16 min  Charges:  $Therapeutic Exercise: 8-22 mins                    G CodesTessie Fass Vangie Henthorn 09/09/2016, 4:39 PM 09/09/2016  Donnella Sham, PT [phone]  (pager)

## 2016-09-09 NOTE — Progress Notes (Signed)
    Subjective  -   She states that her pain is not quite as bad this morning.   Physical Exam:  The right foot remains well perfused. Significant ecchymosis in the right groin without expansion clinically of the hematoma. Regular rate and rhythm Respirations are nonlabored.    Studies: Follow-up ultrasound this morning shows no evidence of pseudoaneurysm.   Assessment/Plan:    Right groin hematoma: The patient's hematocrit and hemoglobin had drifted down overnight.  This is not been a dramatic decline.  Hopefully this just represents equilibration from her blood loss from the hematoma.  This will need to continuously be monitored.  On ultrasound today as well as yesterday, no pseudoaneurysm or extravasation was visualized.  Therefore, I would recommend nonoperative therapy at this time.  I don't think we can get an additional test to confirm the ultrasound as the patient has chronic renal insufficiency and her creatinine is 2.3 this morning.  We will continue with serial hemoglobin checks.  Kristina Howell 09/09/2016 9:28 AM --  Vitals:   09/09/16 0600 09/09/16 0700  BP: (!) 142/49 (!) 142/42  Pulse:  94  Resp: (!) 27 (!) 26  Temp:  98.4 F (36.9 C)    Intake/Output Summary (Last 24 hours) at 09/09/16 0928 Last data filed at 09/09/16 0700  Gross per 24 hour  Intake          1683.04 ml  Output              850 ml  Net           833.04 ml     Laboratory CBC    Component Value Date/Time   WBC 12.6 (H) 09/09/2016 0427   HGB 8.5 (L) 09/09/2016 0427   HCT 26.2 (L) 09/09/2016 0427   PLT 264 09/09/2016 0427    BMET    Component Value Date/Time   NA 136 09/09/2016 0427   K 4.6 09/09/2016 0427   CL 103 09/09/2016 0427   CO2 22 09/09/2016 0427   GLUCOSE 213 (H) 09/09/2016 0427   BUN 38 (H) 09/09/2016 0427   CREATININE 2.32 (H) 09/09/2016 0427   CALCIUM 8.7 (L) 09/09/2016 0427   GFRNONAA 19 (L) 09/09/2016 0427   GFRAA 22 (L) 09/09/2016 0427    COAG Lab  Results  Component Value Date   INR 1.04 09/07/2016   INR 1.06 09/03/2016   No results found for: PTT  Antibiotics Anti-infectives    None       V. Leia Alf, M.D. [facility] Office: [phone] Pager:  [phone]

## 2016-09-09 NOTE — Progress Notes (Signed)
*  PRELIMINARY RESULTS* Vascular Ultrasound Limited arterial right groin eval has been completed.  Preliminary findings: no evidence of pseudoaneurym. Right lateral hip hematoma still noted. Compared to previous images from 2/21, more internal echoes noted today.   Landry Mellow, RDMS, RVT  09/09/2016, 9:21 AM

## 2016-09-09 NOTE — Progress Notes (Signed)
Inpatient Diabetes Program Recommendations  AACE/ADA: New Consensus Statement on Inpatient Glycemic Control (2015)  Target Ranges:  Prepandial:   less than 140 mg/dL      Peak postprandial:   less than 180 mg/dL (1-2 hours)      Critically ill patients:  140 - 180 mg/dL   Results for MACON, LESESNE (MRN [id number]) as of 09/09/2016 11:09  Ref. Range 09/08/2016 07:47 09/08/2016 12:01 09/08/2016 16:59 09/08/2016 21:47  Glucose-Capillary Latest Ref Range: 65 - 99 mg/dL 189 (H) 187 (H) 255 (H) 274 (H)   Results for JALANI, CULLIFER (MRN [id number]) as of 09/09/2016 11:09  Ref. Range 09/09/2016 06:51  Glucose-Capillary Latest Ref Range: 65 - 99 mg/dL 217 (H)    Home DM Meds: Lantus 20 units QHS  Current Insulin Orders: Novolog Resistant Correction Scale/ SSI (0-20 units) TID AC + HS      -Note patient experienced groin hematoma after Cardiac cath yesterday when line pulled.  -Transferred to unit 4N Heart.  -Patient did receive 10 units Lantus last PM.  Takes 20 units Lantus QHS at home.  -CBG elevated to 217 mg/dl this AM.    MD- Please consider adding back Lantus to regimen-  Recommend Lantus 15 units QHS (75% home dose)    --Will follow patient during hospitalization--  Wyn Quaker RN, MSN, CDE Diabetes Coordinator Inpatient Glycemic Control Team Team Pager: [phone] (8a-5p)

## 2016-09-10 ENCOUNTER — Other Ambulatory Visit: Payer: Self-pay | Admitting: *Deleted

## 2016-09-10 LAB — BASIC METABOLIC PANEL
ANION GAP: 7 (ref 5–15)
ANION GAP: 7 (ref 5–15)
BUN: 44 mg/dL — ABNORMAL HIGH (ref 6–20)
BUN: 42 mg/dL — ABNORMAL HIGH (ref 6–20)
CHLORIDE: 103 mmol/L (ref 101–111)
CHLORIDE: 105 mmol/L (ref 101–111)
CO2: 24 mmol/L (ref 22–32)
CO2: 24 mmol/L (ref 22–32)
Calcium: 8.6 mg/dL — ABNORMAL LOW (ref 8.9–10.3)
Calcium: 8.6 mg/dL — ABNORMAL LOW (ref 8.9–10.3)
Creatinine, Ser: 2.41 mg/dL — ABNORMAL HIGH (ref 0.44–1.00)
Creatinine, Ser: 2.44 mg/dL — ABNORMAL HIGH (ref 0.44–1.00)
GFR calc Af Amer: 21 mL/min — ABNORMAL LOW (ref >60)
GFR calc non Af Amer: 18 mL/min — ABNORMAL LOW (ref >60)
GFR calc non Af Amer: 18 mL/min — ABNORMAL LOW (ref >60)
GFR, EST AFRICAN AMERICAN: 21 mL/min — AB (ref >60)
GLUCOSE: 247 mg/dL — AB (ref 65–99)
GLUCOSE: 203 mg/dL — AB (ref 65–99)
POTASSIUM: 4.3 mmol/L (ref 3.5–5.1)
POTASSIUM: 4.4 mmol/L (ref 3.5–5.1)
Sodium: 134 mmol/L — ABNORMAL LOW (ref 135–145)
Sodium: 136 mmol/L (ref 135–145)

## 2016-09-10 LAB — CBC
HEMATOCRIT: 25.1 % — AB (ref 36.0–46.0)
HEMOGLOBIN: 8.2 g/dL — AB (ref 12.0–15.0)
MCH: 28.8 pg (ref 26.0–34.0)
MCHC: 32.7 g/dL (ref 30.0–36.0)
MCV: 88.1 fL (ref 78.0–100.0)
Platelets: 231 10*3/uL (ref 150–400)
RBC: 2.85 MIL/uL — AB (ref 3.87–5.11)
RDW: 15.2 % (ref 11.5–15.5)
WBC: 12.2 10*3/uL — ABNORMAL HIGH (ref 4.0–10.5)

## 2016-09-10 LAB — HEMOGLOBIN AND HEMATOCRIT, BLOOD
HEMATOCRIT: 26.7 % — AB (ref 36.0–46.0)
HEMOGLOBIN: 8.8 g/dL — AB (ref 12.0–15.0)

## 2016-09-10 LAB — GLUCOSE, CAPILLARY
GLUCOSE-CAPILLARY: 275 mg/dL — AB (ref 65–99)
GLUCOSE-CAPILLARY: 228 mg/dL — AB (ref 65–99)
Glucose-Capillary: 205 mg/dL — ABNORMAL HIGH (ref 65–99)
Glucose-Capillary: 219 mg/dL — ABNORMAL HIGH (ref 65–99)

## 2016-09-10 MED ORDER — FUROSEMIDE 10 MG/ML IJ SOLN
40.0000 mg | Freq: Once | INTRAMUSCULAR | Status: AC
  Administered 2016-09-10: 40 mg via INTRAVENOUS
  Filled 2016-09-10: qty 4

## 2016-09-10 NOTE — Progress Notes (Signed)
CARDIAC REHAB PHASE I   PRE:  Rate/Rhythm: 68 afib    BP: sitting 124/56    SaO2: 99 RA  MODE:  Ambulation: to door   POST:  Rate/Rhythm: 92 afib    BP: sitting 137/48     SaO2: 100 RA  Pt very tired and weak from days activities. Stood assist x2 from recliner, very slow coming up due to weakness.  Slow steps with RW to door. Sts she has done "all she can do". To bed. Very difficult for her to sit back deep enough on EOB. Could not stand again to reposition. Pt exhausted. VSS. Will f/u.  [place], ACSM 09/10/2016 2:20 PM

## 2016-09-10 NOTE — Progress Notes (Signed)
Physical Therapy Treatment Patient Details Name: Kristina Howell MRN: [id number] DOB: October 29, 1935 Today's Date: 09/10/2016    History of Present Illness 81 y.o. female presenting with worsening dyspnea and admitted with CHF exacerbation. s/p stent with groin hematoma complicating hospital course. PMH is significant for T2DM on insulin, persistent afib, morbid obesity, HTN, gout, and pancreatitis.      PT Comments    Pt weak and fatigued with minimal activity. Pt with a decline in functional activities since stent. Now feel pt needs ST-SNF prior to return home.   Follow Up Recommendations  SNF     Equipment Recommendations  None recommended by PT    Recommendations for Other Services       Precautions / Restrictions Precautions Precautions: Fall Restrictions Weight Bearing Restrictions: No    Mobility  Bed Mobility               General bed mobility comments: pt in chair  Transfers Overall transfer level: Needs assistance Equipment used: Rolling walker (2 wheeled) Transfers: Sit to/from Stand Sit to Stand: +2 physical assistance;Mod assist Stand pivot transfers: Min assist       General transfer comment: Assist to bring hips up from recliner. Used bed pad under hips to help bring hips up.  Ambulation/Gait Ambulation/Gait assistance: Min assist Ambulation Distance (Feet): 50 Feet Assistive device: Rolling walker (2 wheeled) Gait Pattern/deviations: Step-through pattern;Decreased step length - right;Decreased step length - left;Shuffle Gait velocity: decr Gait velocity interpretation: Below normal speed for age/gender General Gait Details: Assist for balance and support. Pt fatigues quickly and required one standing rest break. Amb on RA with adequate SpO2.   Stairs            Wheelchair Mobility    Modified Rankin (Stroke Patients Only)       Balance Overall balance assessment: Needs assistance Sitting-balance support: No upper extremity  supported;Feet supported Sitting balance-Leahy Scale: Fair     Standing balance support: Bilateral upper extremity supported Standing balance-Leahy Scale: Poor Standing balance comment: walker and min guard for static standing                    Cognition Arousal/Alertness: Awake/alert Behavior During Therapy: Flat affect Overall Cognitive Status: Within Functional Limits for tasks assessed                 General Comments: pt awake, but keeping eyes closed much of session    Exercises      General Comments        Pertinent Vitals/Pain Pain Assessment: Faces Faces Pain Scale: Hurts even more Pain Location: RLE Pain Descriptors / Indicators: Aching;Grimacing Pain Intervention(s): Limited activity within patient's tolerance;Monitored during session    Home Living                      Prior Function            PT Goals (current goals can now be found in the care plan section) Acute Rehab PT Goals Patient Stated Goal: get home to usual activities Progress towards PT goals: Not progressing toward goals - comment (Pt with fatigue and pain after stent and hematoma)    Frequency    Min 3X/week      PT Plan Discharge plan needs to be updated    Co-evaluation             End of Session Equipment Utilized During Treatment: Gait belt Activity Tolerance: Patient limited by fatigue Patient left:  in chair;with call bell/phone within reach Nurse Communication: Mobility status PT Visit Diagnosis: Muscle weakness (generalized) (M62.81);Unsteadiness on feet (R26.81)     Time: 2774-1287 PT Time Calculation (min) (ACUTE ONLY): 20 min  Charges:  $Gait Training: 8-22 mins                    G Codes:       Shary Decamp [place] 13-Sep-2016, 12:22 PM [employer] PT [phone]

## 2016-09-10 NOTE — Care Management Note (Signed)
Case Management Note  Patient Details  Name: Lanecia Sliva MRN: [id number] Date of Birth: May 11, 1936  Subjective/Objective:    Adm w chf                Action/Plan: lives w husb and act w adv homecare   Expected Discharge Date:  09/01/16               Expected Discharge Plan:  [place]  In-House Referral:  Clinical Social Work  Discharge planning Services  CM Consult  Post Acute Care Choice:    Choice offered to:     DME Arranged:    DME Agency:     HH Arranged:  PT, OT [place] Agency:  [facility]  Status of Service:  In process, will continue to follow  If discussed at Long Length of Stay Meetings, dates discussed:    Additional Comments:phy there saw pt and rec snf for rehab. sw ref placed.  Lacretia Leigh, RN 09/10/2016, 11:44 AM

## 2016-09-10 NOTE — Progress Notes (Signed)
Family Medicine Teaching Service Daily Progress Note Intern Pager: [phone]  Patient name: Kristina Howell Medical record number: [id number] Date of birth: 13-May-1936 Age: 81 y.o. Gender: female  Primary Care Provider: Jeri Modena Consultants: Cardiology  Code Status: full  Pt Overview and Major Events to Date:  08/29/2016: Admit to FPTS 09/03/2016: Left Heart Cath -- Multivessel CAD, including 90% mid LAD stenosis involving small diagonal branch, 70% ostial/proximal ramus lesion, 30% OM stenosis and sequential 70-80% proximal/mid RCA stenoses 09/08/2016: PCI 09/09/16: 1U PRBC, hgb up to 8.2 this AM  Assessment and Plan: Kristina Howell a 81 y.o.femalepresenting with worsening dyspnea. PMH is significant for T2DM on insulin, persistent afib, morbid obesity, HTN, gout, and pancreatitis.   Acute Blood Loss Anemia, new - S/p 1U PRBC yesterday, hgb up to 8.2  - following cards recs: No heparin - f/u 2PM H/H   CHF diagnosis, worse: Hypervolemic. Daily wts 275 (Dry weight 270).  Monitoring creatinine, 2.42 early this AMGiven 40mg  IV lasix x1 by Cardiology and they recommend starting PO tomorrow.  - IV lasix 40mg  x1 - PO tomorrow  T2DM, stable:  Home lantus 20u nightly. CBG 205, 290 overnight. 25u novolg used yesterday and 15U lantus.  - resistant sliding scale - continue current regimen  HTN, acutely hypotensive with blood loss, stable - - hold benazepril, consider switching to losartan at DC for uric acid lowering properties - cont hydralazine 25 mg TID, metoprolol XL 50 mg, norvasc 10 mg (new this admit while holding ACE) - monitor BP  AKI, worseSCr 1.84 > 2.32>2.42Baseline may be ~ 1.0 (2015).  Worsened in setting of recent diuresis, CHF, also PCI stent with contrast and episode of acute hypotension - Hold home ACE until SCr improves - PO lasix tomorrow - AM BMP  Hypercholesterolemia, stable LDL 180 - cont Crestor 20 mg qd  R foot ulcer, stable Callused, does  not appear infected. - Consult WOC while inpatient  Persistent afib, stable Rate controlled - holding heparin for acute bleed, following cards recs - cont home toprol 50 mg  Hx of gout, stable  Continue home allopurinol  FEN/GI: Heart healthy/carb modified, SLIV Prophylaxis: On eliquis  Disposition: PT recommends home health PT, supervision for mobility/OOB   Subjective:  AAOx3, but appears a bit sedated.  No   Objective: Temp:  [97.6 F (36.4 C)-98.5 F (36.9 C)] 97.6 F (36.4 C) (02/23 0700) Pulse Rate:  [70-100] 81 (02/23 0500) Resp:  [15-33] 23 (02/23 0500) BP: (110-148)/(45-101) 135/58 (02/23 0500) SpO2:  [97 %-100 %] 99 % (02/23 0500) Weight:  [277 lb 12.5 oz (126 kg)-285 lb 11.5 oz (129.6 kg)] 285 lb 11.5 oz (129.6 kg) (02/23 0400) Physical Exam: General: NAD, sitting up in bed Cardiovascular: RRR, no m/r/g Respiratory: CTABL, diminished at the bases bilaterally, no wheezing or rhonchi Abdomen:  +ecchymosis and tender hematoma RLQ,  Extremities: 2+ pitting edema noted over shins bilaterally Derm: chronic hyperpigmentation venous changes over lower leg.   Laboratory:  Recent Labs Lab 09/09/16 0427 09/09/16 1300 09/09/16 2013 09/10/16 0227  WBC 12.6* 13.4*  --  12.2*  HGB 8.5* 7.5* 8.7* 8.2*  HCT 26.2* 23.3* 27.1* 25.1*  PLT 264 226  --  231    Recent Labs Lab 09/08/16 2020 09/09/16 0427 09/10/16 0227  NA 136 136 134*  K 5.0 4.6 4.3  CL 105 103 103  CO2 21* 22 24  BUN 35* 38* 44*  CREATININE 2.14* 2.32* 2.41*  CALCIUM 8.8* 8.7* 8.6*  GLUCOSE 269* 213* 247*  Imaging/Diagnostic Tests: No results found.   Eloise Levels, MD 09/10/2016, 8:26 AM PGY-1, [place] Intern pager: [phone], text pages welcome

## 2016-09-10 NOTE — Progress Notes (Signed)
Inpatient Diabetes Program Recommendations  AACE/ADA: New Consensus Statement on Inpatient Glycemic Control (2015)  Target Ranges:  Prepandial:   less than 140 mg/dL      Peak postprandial:   less than 180 mg/dL (1-2 hours)      Critically ill patients:  140 - 180 mg/dL   Lab Results  Component Value Date   GLUCAP 275 (H) 09/10/2016   HGBA1C 10.3 (H) 08/30/2016    Review of Glycemic Control Results for Kristina Howell, Kristina Howell (MRN [id number]) as of 09/10/2016 13:04  Ref. Range 09/09/2016 11:28 09/09/2016 15:51 09/09/2016 21:53 09/10/2016 07:47 09/10/2016 11:41  Glucose-Capillary Latest Ref Range: 65 - 99 mg/dL 171 (H) 276 (H) 290 (H) 205 (H) 275 (H)   Diabetes history: DM2 Outpatient Diabetes medications: Lantus 20 units daily Current orders for Inpatient glycemic control: Lantus 15 units +Novolog correction 0-20 units tid + 0-5 units hs  Inpatient Diabetes Program Recommendations:  Noted elevated fasting.  Please consider increase in Lantus insulin to 20 units daily. Met with patient briefly @ bedside and reviewed elevated A1c of 10.3. Patient states she takes her insulin regularly as prescribed and does not eat much carbohydrate or drinks with regular sugar. Will follow.  Thank you, Nani Gasser. Izrael Peak, RN, MSN, CDE Inpatient Glycemic Control Team Team Pager [phone] (8am-5pm) 09/10/2016 1:10 PM

## 2016-09-10 NOTE — Progress Notes (Signed)
Patient Name: Kristina Howell Date of Encounter: 09/10/2016  Primary Cardiologist: Hamilton Capri at [place], here Dr [place]  Hospital Problem List     Principal Problem:   Acute systolic heart failure [facility]) Active Problems:   Peripheral edema   Diabetes mellitus ([place])   Persistent atrial fibrillation (H[facility])   Morbidly obese (HCC)   Type 2 diabetes mellitus with diabetic foot ulcer ([place])   Acute kidney injury ([place])   Dyspnea   Unstable angina (HCC)   Chronic anticoagulation   Coronary artery disease due to lipid rich plaque   Coronary artery disease involving native coronary artery of native heart with unstable angina pectoris [facility])     Patient Profile        81 y.o. female w/ hx Type 2 DM on insulin, persistent afib since 2015, morbid obesity, HTN, gout, CKD IV, and pancreatitis was admitted 08/29/2016 w/ CHF, CP. Cath w/ multi-vessel dz, s/p staged DES LAD & RCA 02/21, groin bleed after procedure, VVS saw, Fem-Stop placed, repeat US pending, H&H continues to drop, transfused 1 unit PRB, imdur discontinued, plan to resume lasix on 2/23   Subjective   [name] But that she is feeling better, her breathing has improved and that she is experiencing no pain.  Inpatient Medications    Scheduled Meds: . allopurinol  300 mg Oral Daily  . amLODipine  10 mg Oral Daily  . aspirin  81 mg Oral Daily  . clopidogrel  75 mg Oral Q breakfast  . hydrALAZINE  25 mg Oral Q8H  . insulin aspart  0-20 Units Subcutaneous TID WC  . insulin aspart  0-5 Units Subcutaneous QHS  . insulin glargine  15 Units Subcutaneous QHS  . metoprolol succinate  50 mg Oral Daily  . rosuvastatin  20 mg Oral q1800  . silver sulfADIAZINE   Topical Daily  . sodium chloride flush  3 mL Intravenous Q12H   Continuous Infusions:  PRN Meds: sodium chloride, acetaminophen, HYDROcodone-acetaminophen, ondansetron (ZOFRAN) IV, sodium chloride flush   Vital  Signs    Vitals:   09/10/16 0700 09/10/16 0800 09/10/16 0900 09/10/16 1000  BP: (!) 143/47 (!) 120/36 (!) 117/58 (!) 115/54  Pulse: 83 80 97 72  Resp: (!) 23 (!) 26 (!) 30 18  Temp: 97.6 F (36.4 C)     TempSrc: Oral     SpO2: 100% 99% 98% 100%  Weight:      Height:        Intake/Output Summary (Last 24 hours) at 09/10/16 1102 Last data filed at 09/10/16 0800  Gross per 24 hour  Intake              810 ml  Output              300 ml  Net              510 ml   Filed Weights   09/09/16 0400 09/09/16 1000 09/10/16 0400  Weight: 275 lb 2.2 oz (124.8 kg) 277 lb 12.5 oz (126 kg) 285 lb 11.5 oz (129.6 kg)    Telemetry    A. fib - Personally Reviewed  ECG    None since 09/09/16    Physical Exam   GEN: Well nourished, well developed, in no acute distress.  HEENT: Grossly normal.  Neck: Supple, no JVD, carotid bruits, or masses. Cardiac: RRR, no murmurs, rubs, or gallops. No clubbing, cyanosis, + pedal edema.  Radials/DP/PT 2+ and equal bilaterally.  Respiratory:  Respirations regular and unlabored, clear to auscultation bilaterally. GI: Soft, nontender, nondistended, BS + x 4. MS: no deformity or atrophy. Skin: warm and dry, no rash. Neuro:  Strength and sensation are intact. Psych: AAOx3.  Normal affect.  Labs    CBC  Recent Labs  09/09/16 0006  09/09/16 1300 09/09/16 2013 09/10/16 0227  WBC 13.2*  < > 13.4*  --  12.2*  NEUTROABS 11.1*  --   --   --   --   HGB 9.0*  < > 7.5* 8.7* 8.2*  HCT 27.8*  < > 23.3* 27.1* 25.1*  MCV 88.0  < > 88.3  --  88.1  PLT 273  < > 226  --  231  < > = values in this interval not displayed. Basic Metabolic Panel  Recent Labs  09/10/16 0227 09/10/16 0747  NA 134* 136  K 4.3 4.4  CL 103 105  CO2 24 24  GLUCOSE 247* 203*  BUN 44* 42*  CREATININE 2.41* 2.44*  CALCIUM 8.6* 8.6*    Radiology    Chest xray 2 view 08/29/16  IMPRESSION: Mild CHF with bibasilar atelectatic changes and effusions.  Cardiac Studies    Coronary stent intervention 09/08/16   CHIP due to renal insufficiency, long diffuse disease in LAD and right coronary, in an elderly diabetic patient felt to be a poor candidate for CABG. Greatest risk is acute kidney injury and access site bleeding. Contrast during the procedure 160 cc. Small hematoma present above cath site at completion of procedure.  Successful LAD provisional stenting with reduction in 90% stenosis to 0% using a 28 x 2.75 Synergy DES postdilated to 3.0 mm in diameter. TIMI grade 2 flow was noted in the diagonal post procedure as was a case prior to the procedure.  Successful proximal to mid RCA stenting with overlapping 3.5 Promus Premier 24 and 8 mm stents. Stents were postdilated to 4.0 mm reducing stenosis to 0% with TIMI grade 3 flow.  RECOMMENDATIONS:   Sheath removal later today. Care to avoid bleeding. Discussed with floor nurse.  IV hydration with IV Lasix as needed to prevent pulmonary congestion. Will give saline at 125 cc per hour for 10 hours.  Subcutaneous heparin for DVT prophylaxis but not until a.m. at which time NOAC can be resumed if no bleeding. Will need triple drug therapy with aspirin, Plavix, and NOAC x 4 weeks then drop aspirin.  Should not be discharged until 48 hours postprocedure to monitor kidney function.  Post-Intervention Diagram        Left Heart Cath and Coronary Angiography 09/03/16  Conclusions: 1. Multivessel CAD, including 90% mid LAD stenosis involving small diagonal branch, 70% ostial/proximal ramus lesion, 30% OM stenosis and sequential 70-80% proximal/mid RCA stenoses. 2. Mildly elevated left ventricular filling pressure. 3. Mild to moderate aortic valve gradient, which is suboptimally evaluated due to heart rate variability related to atrial fibrillation. 4. Right radial artery stenosis, which is likely a combination of fixed disease and vasospasm. Stenosis was successfully navigated with angled micropuncture and  Versacore wires.  Recommendations: 1. Medical optimization, including further diuresis and close monitoring of renal function. 2. Plan for stage PCI to mid LAD and proximal/mid RCA next week when volume status has improved and renal function has improved/stabilized. 3. Restart heparin infusion 4 hours after TR band removal.  Transthoracic Echocardiography 08/30/16  Study Conclusions  - Left ventricle: The cavity size was normal. Wall thickness was  increased in a pattern of mild LVH. Severe hypokinesis of the mid   anteroseptal wall, apical septal wall, true apex, mid to apical   anterior wall. Indeterminant diastolic function (atrial   fibrillation). The estimated ejection fraction was 45%. - Aortic valve: There was no stenosis. - Mitral valve: Mildly to moderately calcified annulus. There was   trivial regurgitation. - Left atrium: The atrium was mildly to moderately dilated. - Right ventricle: The cavity size was normal. Systolic function   was normal. - Pulmonary arteries: No complete TR doppler jet so unable to   estimate PA systolic pressure. - Systemic veins: IVC measured 2.3 cm with < 50% respirophasic   variation, suggesting RA pressure 15 mmHg.  Impressions:  - Normal LV size with mild LV hypertrophy. EF 45% with wall motion   abnormalities as described above, in LAD coronary distribution.   Normal RV size and systolic function. No significant valvular   abnormalities.  Assessment & Plan    1.CAD: s/p PCI/stents  02/21 to LAD & RCA - on ASA, Plavix (curbsided Dr Burt Knack, continue both for now) - on BB (Toprol), statin (crestor)  2 Acute on chronic systolic CHF LVEF 97% with severe hypokinesis of mid anteroseptal, apical septla apical wall RVEF normal by echo. -Volume status improved since last week with signif diuresis, I/O neg 10 L since admit, wt up 5 lbs since 08/29/16 - Pt comfortable though still with mild vol increase, no respiratory  distress -  Continue to watch IOs closely -  Given lasix 40 mg IV once today -  Start on oral 40 mg daily tomorrow.  3 CKD stage III - Baseline Cr around 1.0, Cr 1.7 on admit, now 2.44 - follow, per IM  4. Atrial fib, permanent - rate generally ok - some bradycardia overnight, asymptomatic - continue BB at current dose.  5. Chronic anticoag - CHA2DS2VASc=7 (HTN, CHF, age (54), female, CAD, DM).  - pt has been off Eliquis (dose decreased to 2.5 mg bid) since 02/13 - off heparin since 02/19 - keep off anticoag for at least a week.  6. Groin hematoma - at cath site, H&H have been trending down, pt transfused 1 unit PRBCs w/ hemoglobin of 7.5 (2/22) which improved to 8.2 (2/23)   Pt required two people to assist ambulating today. Considering discharge in 24 hours but more likely on Sunday based on how she is doing.  Signed, Linus Mako, PA-C  09/10/2016, 11:02 AM     Pt seen and examined   I agree with findings as noted by TGreene above  Pt comfortable ni chair.  Lungs  CTA  Cardiac Irreg Irreg  No S3  R groin:  Stable  Tr LE edema Hgb is rel stable   WIll give 1 dose of IV lasix then switch to PO Ambulate with PT assist.    Dorris Carnes

## 2016-09-10 NOTE — Progress Notes (Deleted)
Thanks. I was planning to give an update on vascular access in March.

## 2016-09-10 NOTE — Discharge Summary (Signed)
[facility] Discharge Summary  Patient name: Kristina Howell Medical record number: [id number] Date of birth: 1936-06-14 Age: 81 y.o. Gender: female Date of Admission: 08/29/2016  Date of Discharge: 09/16/16 Admitting Physician: Blane Ohara McDiarmid, MD  Primary Care Provider: Jeri Modena Consultants: Cardiology  Indication for Hospitalization: Dyspnea  Discharge Diagnoses/Problem List:  Patient Active Problem List   Diagnosis Date Noted  . Femoral artery hematoma complicating cardiac catheterization 09/15/2016  . Status post coronary artery stent placement   . Difficulty in walking, not elsewhere classified   . Coronary artery disease involving native coronary artery of native heart with unstable angina pectoris ([place])   . Coronary artery disease due to lipid rich plaque   . Chronic anticoagulation   . Acute systolic heart failure ([place])   . Unstable angina ([place])   . Type 2 diabetes mellitus with diabetic foot ulcer ([place]) 08/30/2016  . Acute kidney injury ([place]) 08/30/2016  . Dyspnea 08/30/2016  . Peripheral edema   . Diabetes mellitus ([place])   . Persistent atrial fibrillation ([place])   . Morbidly obese ([place])     Disposition: Home, with home health services  Discharge Condition: Stable/Improved  Discharge Exam:  General: NAD, sitting up in chair Cardiovascular: RRR, no m/r/g Respiratory: CTA bilaterally, no W/R/R, decreased breath sounds at the lung bases bilaterally Abdomen:  + Significant  ecchymosis and tender hematoma RLQ extending to R flank, otherwise abdomen is soft and nontender, nondistended Extremities:2+ pitting edema bilateral feet to shins bilaterally Psych: normal mood and affect   Brief Hospital Course:  08/29/2016: Admit to [facility] 09/03/2016: Left Heart Cath -- Multivessel CAD, including 90% mid LAD stenosis involving small diagonal branch, 70% ostial/proximal ramus lesion, 30% OM stenosis and sequential 70-80% proximal/mid RCA  stenoses 09/08/2016: Cardiac Cath PCI placement, with subsequent groin hematoma and symptomatic anemia 09/09/16: Transfused 1u PRBC for Hgb 7.5 09/13/16: Transfused 1u PRBC for Hgb 7.5  Patient is a 81 year old female who presents with worsening dyspnea. Her past medical history is significant for T2DM on insulin, persistent A. fib, morbid obesity, hypertension, gout, and pancreatitis.  HFrEF/CAD Patient presented significantly volume overloaded. She was intermittently diuresed throughout her hospital stay with IV Lasix. Cardiac echo on 2/21 was notable for EF of 45%, mild LVH, and severe hypokinesis of cardiac walls in LAD distribution. Decision was made for patient to go for cardiac cath and subsequently for PCI placement.(Discussed below).  While hospitalized the patient was diuresed with IV Lasix and then transitioned to PO Lasix.  She was still thought to be hypervolemic at discharge (Dry weight ~270, weight at discharge 284), and so she was discharged on 80 mg PO Lasix daily, which is an increase from her previous home dose of 40 mg Lasix PO daily.  Her respiratory status was stable at discharge.  She ambulated with stable oxygen saturations prior to leaving the hospital.  Right groin Hematoma/symptomatic anemia/atrial fibrillation After cardiac cath, patient was noted to have right groin hematoma (significant) with associated symptomatic anemia to Hgb 7.5. Femoral doppler negative for extravasation or pseudoaneurysm. The patient received a total of 2 units of packed red blood cells during her hospitalization. Her anemia complicated care in that she is an atrial fibrillation patient with a significant clotting risk as well as bleeding risk.  Hemoglobin was stable at 9 at discharge.  Her hemoglobin needs to be closely followed up at outpatient follow up.  Cardiology recommended triple therpay for four weeks (ASA, Plavix, Eliquis - 2/28 through 3/28), and  then discontinue aspirin and keep Plavix  and Eliquis going forward. The patient was started on Eliquis 2.5 mg BID in the hospital and hemoglobin was monitored.  Coronary artery disease Severe hypokinesis of cardiac walls in LAD distribution on cardiac echo led to cardiac catheterization and stent placement during this hospital stay.  The patient was stated on ASA and plavix after PCI.  She was monitored by cardiology throughout her hospitalization.  Full cath reports are included below.  AKI The patient received lasix for diuresis and also a significant dye load for cardiac cathetarizations while hospitalized. As a result her creatinine was elevated signifying acute kidney injury.  Her baseline creatinine was thought to be around 1.8 (at admission) and creatinine peaked at 2.52 and was noted to be trending down for several days prior to discharge. Kidney function should be rechecked at hospital follow up to ensure this continues to improve. Cr was 2.28 on the day of discharge.  Chronic Illnesses  - T2DM, HTN, HLD, and right foot ulcer were stable throughout hospitalization.  Issues for Follow Up:  1. CAD and Afib - s/p stent placement.Cardiology recommends triple therapy with ASA, Plavix, and Eliquis for 4 weeks (2/28 through 3/28), then discontinue aspirin. Continue Plavix and Eliquis. 2. Anemia/groin hematoma - Please recheck CBC for Hgb at follow up.  Hgb was 9 at discharge. Patient did receive two blood transfusions while hospitalized.  Please also re-evaluate groin hematoma. 3. AKI - please recheck  BMP for creatinine at hospital follow up. Creatinine was trending down at 2.28 with baseline of ~1.8 at discharge. 4. CHF exacerbation - patient still volume-up at discharge. Sent on 80 mg PO lasix daily.  Please follow up volume status and titrate lasix as appropriate and f/u BMP 5. T2DM  - Lantus was titrated up as needed throughout hospitalization.  Patient was discharged on 20 units of Lantus daily.  Significant Procedures: Cardiac  Cath, PCI (details below).   Significant Labs and Imaging:   Recent Labs Lab 09/14/16 0404 09/15/16 0212 09/16/16 0330  WBC 9.3 9.9 10.4  HGB 8.7* 8.8* 9.0*  HCT 26.3* 27.0* 28.1*  PLT 263 289 331    Recent Labs Lab 09/12/16 0353 09/13/16 0420 09/14/16 0404 09/15/16 0212 09/16/16 0330  NA 132* 133* 132* 134* 133*  K 3.9 3.7 3.7 3.8 4.0  CL 99* 98* 100* 103 101  CO2 23 23 22 23 23   GLUCOSE 194* 197* 165* 137* 239*  BUN 48* 45* 46* 46* 47*  CREATININE 2.52* 2.43* 2.39* 2.36* 2.28*  CALCIUM 8.7* 8.9 8.9 9.1 8.9   Lipid Panel     Component Value Date/Time   CHOL 242 (H) 08/30/2016 0206   TRIG 130 08/30/2016 0206   HDL 36 (L) 08/30/2016 0206   CHOLHDL 6.7 08/30/2016 0206   VLDL 26 08/30/2016 0206   LDLCALC 180 (H) 08/30/2016 0206   HbA1c  - 10.3  Cardiac Echo 08/30/2016 - Left ventricle: The cavity size was normal. Wall thickness was   increased in a pattern of mild LVH. Severe hypokinesis of the mid   anteroseptal wall, apical septal wall, true apex, mid to apical   anterior wall. Indeterminant diastolic function (atrial   fibrillation). The estimated ejection fraction was 45%. - Aortic valve: There was no stenosis. - Mitral valve: Mildly to moderately calcified annulus. There was   trivial regurgitation. - Left atrium: The atrium was mildly to moderately dilated. - Right ventricle: The cavity size was normal. Systolic function   was normal. -  Pulmonary arteries: No complete TR doppler jet so unable to   estimate PA systolic pressure. - Systemic veins: IVC measured 2.3 cm with < 50% respirophasic   variation, suggesting RA pressure 15 mmHg.  Impressions: - Normal LV size with mild LV hypertrophy. EF 45% with wall motion   abnormalities as described above, in LAD coronary distribution.   Normal RV size and systolic function. No significant valvular   abnormalities.  Cardiac Catheterization 09/03/2016 Conclusions: 1. Multivessel CAD, including 90% mid LAD  stenosis involving small diagonal branch, 70% ostial/proximal ramus lesion, 30% OM stenosis and sequential 70-80% proximal/mid RCA stenoses. 2. Mildly elevated left ventricular filling pressure. 3. Mild to moderate aortic valve gradient, which is suboptimally evaluated due to heart rate variability related to atrial fibrillation. 4. Right radial artery stenosis, which is likely a combination of fixed disease and vasospasm. Stenosis was successfully navigated with angled micropuncture and Versacore wires. Recommendations: 1. Medical optimization, including further diuresis and close monitoring of renal function. 2. Plan for stage PCI to mid LAD and proximal/mid RCA next week when volume status has improved and renal function has improved/stabilized. 3. Restart heparin infusion 4 hours after TR band removal.   Cardiac Cath 2/21 Conclusion    CHIP due to renal insufficiency, long diffuse disease in LAD and right coronary, in an elderly diabetic patient felt to be a poor candidate for CABG. Greatest risk is acute kidney injury and access site bleeding. Contrast during the procedure 160 cc. Small hematoma present above cath site at completion of procedure.  Successful LAD provisional stenting with reduction in 90% stenosis to 0% using a 28 x 2.75 Synergy DES postdilated to 3.0 mm in diameter. TIMI grade 2 flow was noted in the diagonal post procedure as was a case prior to the procedure.  Successful proximal to mid RCA stenting with overlapping 3.5 Promus Premier 24 and 8 mm stents. Stents were postdilated to 4.0 mm reducing stenosis to 0% with TIMI grade 3 flow.  RECOMMENDATIONS:   Sheath removal later today. Care to avoid bleeding. Discussed with floor nurse.  IV hydration with IV Lasix as needed to prevent pulmonary congestion. Will give saline at 125 cc per hour for 10 hours.  Subcutaneous heparin for DVT prophylaxis but not until a.m. at which time NOAC can be resumed if no bleeding. Will  need triple drug therapy with aspirin, Plavix, and NOAC x 4 weeks then drop aspirin.  Should not be discharged until 48 hours postprocedure to monitor kidney function.    Results/Tests Pending at Time of Discharge: none  Discharge Medications:  Allergies as of 09/16/2016   No Known Allergies     Medication List    TAKE these medications   allopurinol 300 MG tablet Commonly known as:  ZYLOPRIM Take 300 mg by mouth daily.   apixaban 2.5 MG Tabs tablet Commonly known as:  ELIQUIS Take 1 tablet (2.5 mg total) by mouth 2 (two) times daily. What changed:  medication strength  how much to take  when to take this   aspirin 81 MG chewable tablet Chew 1 tablet (81 mg total) by mouth daily. Start taking on:  09/17/2016   benazepril 40 MG tablet Commonly known as:  LOTENSIN Take 40 mg by mouth daily.   clopidogrel 75 MG tablet Commonly known as:  PLAVIX Take 1 tablet (75 mg total) by mouth daily with breakfast. Start taking on:  09/17/2016   ferrous sulfate 325 (65 FE) MG tablet Take 1 tablet (325 mg total)  by mouth 2 (two) times daily with a meal.   furosemide 80 MG tablet Commonly known as:  LASIX Take 1 tablet (80 mg total) by mouth daily. Start taking on:  09/17/2016 What changed:  medication strength  how much to take   insulin glargine 100 UNIT/ML injection Commonly known as:  LANTUS Inject 20 Units into the skin at bedtime.   metoprolol succinate 50 MG 24 hr tablet Commonly known as:  TOPROL-XL Take 50 mg by mouth at bedtime.   rosuvastatin 10 MG tablet Commonly known as:  CRESTOR Take 2 tablets (20 mg total) by mouth daily.       Discharge Instructions: Please refer to Patient Instructions section of EMR for full details.  Patient was counseled important signs and symptoms that should prompt return to medical care, changes in medications, dietary instructions, activity restrictions, and follow up appointments.   Follow-Up Appointments: Follow-up  Information    SKILLMAN,KATIE, PA-C. Go on 09/21/2016.   Specialty:  Physician Assistant Why:  @11 :15 for hospital follow up appointment Contact information: [place] [address]        Hao Meng, [place] Follow up on 09/24/2016.   Specialties:  Cardiology, Radiology Why:  at 1:30 pm with PA for our group.  Contact information: [address] [place] [address] [phone]           Eloise Levels, MD 09/16/2016, 11:26 AM PGY-1, [place]

## 2016-09-10 NOTE — Progress Notes (Signed)
Occupational Therapy Treatment Patient Details Name: Kristina Howell MRN: [id number] DOB: July 18, 1936 Today's Date: 09/10/2016    History of present illness 81 y.o. female presenting with worsening dyspnea and admitted with CHF exacerbation. s/p stent with groin hematoma complicating hospital course. PMH is significant for T2DM on insulin, persistent afib, morbid obesity, HTN, gout, and pancreatitis.     OT comments  Pt with increased weakness following stent. Requiring 2 person assist to rise from low surfaces this visit and extensive assistance to perform bathing and dressing. Pt obviously not feeling well and keeping her eyes closed much of session , but vital signs stable throughout session. Updated d/c disposition recommendation to SNF for further rehab.  Follow Up Recommendations  SNF;Supervision/Assistance - 24 hour    Equipment Recommendations  None recommended by OT    Recommendations for Other Services      Precautions / Restrictions Precautions Precautions: Fall Restrictions Weight Bearing Restrictions: No       Mobility Bed Mobility               General bed mobility comments: pt in chair  Transfers Overall transfer level: Needs assistance Equipment used: Rolling walker (2 wheeled) Transfers: Sit to/from [facility] Sit to Stand: +2 physical assistance;Mod assist;+2 safety/equipment Stand pivot transfers: Min assist       General transfer comment: assist of 2 to rise from recliner using momentum and increased time, mod of one person from [facility], placed pillows in recliner to assist pt with standing    Balance     Sitting balance-Leahy Scale: Fair       Standing balance-Leahy Scale: Poor Standing balance comment: requires B UE support for standing and transfers                   ADL Overall ADL's : Needs assistance/impaired Eating/Feeding: Independent;Sitting   Grooming: Maximal assistance;Brushing hair;Wash/dry  hands;Sitting   Upper Body Bathing: Total assistance;Sitting   Lower Body Bathing: Total assistance;Sit to/from stand   Upper Body Dressing : Moderate assistance;Sitting       Toilet Transfer: Stand-pivot;RW;BSC;+2 for safety/equipment;Minimal assistance   Toileting- Clothing Manipulation and Hygiene: Total assistance;Sit to/from stand         General ADL Comments: pt with pain and poor activity tolerance      Vision                     Perception     Praxis      Cognition   Behavior During Therapy: Flat affect Overall Cognitive Status: Within Functional Limits for tasks assessed                  General Comments: pt awake, but keeping eyes closed much of session      Exercises     Shoulder Instructions       General Comments      Pertinent Vitals/ Pain       Pain Assessment: Faces Faces Pain Scale: Hurts little more Pain Location: generalized Pain Descriptors / Indicators: Aching;Grimacing Pain Intervention(s): Monitored during session;Repositioned  Home Living                                          Prior Functioning/Environment              Frequency  Min 2X/week        Progress Toward  Goals  OT Goals(current goals can now be found in the care plan section)  Progress towards OT goals: Not progressing toward goals - comment (pt with fatigue following stent)  Acute Rehab OT Goals Patient Stated Goal: get home to usual activities OT Goal Formulation: With patient Time For Goal Achievement: 09/14/16 Potential to Achieve Goals: Good  Plan Discharge plan needs to be updated    Co-evaluation                 End of Session Equipment Utilized During Treatment: Rolling walker  OT Visit Diagnosis: Unsteadiness on feet (R26.81);Muscle weakness (generalized) (M62.81);Pain   Activity Tolerance Patient limited by fatigue   Patient Left in chair;with call bell/phone within reach   Nurse Communication  Mobility status (ok to give cranberry juice)        Time: 4650-3546 OT Time Calculation (min): 24 min  Charges: OT General Charges $OT Visit: 1 Procedure OT Treatments $Self Care/Home Management : 23-37 mins     Malka So 09/10/2016, 9:49 AM [phone]

## 2016-09-10 NOTE — Progress Notes (Signed)
  Progress Note    09/10/2016 8:38 AM 2 Days Post-Op  Subjective:  Having right leg pain   Vitals:   09/10/16 0500 09/10/16 0700  BP: (!) 135/58   Pulse: 81   Resp: (!) 23   Temp:  97.6 F (36.4 C)    Physical Exam: aaox3 Right groin with stable hematoma No palpable mass in right groin dp is palpable on right  CBC    Component Value Date/Time   WBC 12.2 (H) 09/10/2016 0227   RBC 2.85 (L) 09/10/2016 0227   HGB 8.2 (L) 09/10/2016 0227   HCT 25.1 (L) 09/10/2016 0227   PLT 231 09/10/2016 0227   MCV 88.1 09/10/2016 0227   MCH 28.8 09/10/2016 0227   MCHC 32.7 09/10/2016 0227   RDW 15.2 09/10/2016 0227   LYMPHSABS 1.3 09/09/2016 0006   MONOABS 0.7 09/09/2016 0006   EOSABS 0.0 09/09/2016 0006   BASOSABS 0.0 09/09/2016 0006    BMET    Component Value Date/Time   NA 134 (L) 09/10/2016 0227   K 4.3 09/10/2016 0227   CL 103 09/10/2016 0227   CO2 24 09/10/2016 0227   GLUCOSE 247 (H) 09/10/2016 0227   BUN 44 (H) 09/10/2016 0227   CREATININE 2.41 (H) 09/10/2016 0227   CALCIUM 8.6 (L) 09/10/2016 0227   GFRNONAA 18 (L) 09/10/2016 0227   GFRAA 21 (L) 09/10/2016 0227    INR    Component Value Date/Time   INR 1.04 09/07/2016 1526     Intake/Output Summary (Last 24 hours) at 09/10/16 0838 Last data filed at 09/09/16 2000  Gross per 24 hour  Intake              693 ml  Output              300 ml  Net              393 ml   Summary: No evidence of pseudoaneurysm. Hematoma visualized right lateral hip.  Assessment:  81 y.o. female is cardiac cath with right groin hematoma, no psa.  Plan: No plans for vascular intervention. Please call with questions or changes in course.    Johnpaul Gillentine C. Donzetta Matters, MD [facility] Office: [phone] Pager: [phone]  09/10/2016 8:38 AM

## 2016-09-10 NOTE — NC FL2 (Signed)
[place] MEDICAID FL2 LEVEL OF CARE SCREENING TOOL     IDENTIFICATION  Patient Name: Kristina Howell Birthdate: 1936-05-05 Sex: female Admission Date (Current Location): 08/29/2016  [facility] [place] Number:  Herbalist and Address:  [facility], [place] [address], [place], [place] [address]      Provider Number: [id number]  Attending Physician Name and Address:  Blane Ohara McDiarmid, MD  Relative Name and Phone Number:       Current Level of Care: Hospital Recommended Level of Care: [place] Prior Approval Number:    Date Approved/Denied:   PASRR Number: [id number]  Discharge Plan: SNF    Current Diagnoses: Patient Active Problem List   Diagnosis Date Noted  . Coronary artery disease involving native coronary artery of native heart with unstable angina pectoris ([place])   . Coronary artery disease due to lipid rich plaque   . Chronic anticoagulation   . Acute systolic heart failure ([place])   . Unstable angina ([place])   . Type 2 diabetes mellitus with diabetic foot ulcer ([place]) 08/30/2016  . Acute kidney injury ([place]) 08/30/2016  . Dyspnea 08/30/2016  . Peripheral edema   . Diabetes mellitus ([place])   . Persistent atrial fibrillation ([place])   . Morbidly obese ([facility])     Orientation RESPIRATION BLADDER Height & Weight     Self, Time, Situation, Place  Normal Continent Weight: 285 lb 11.5 oz (129.6 kg) Height:  5\' 6"  (167.6 cm)  BEHAVIORAL SYMPTOMS/MOOD NEUROLOGICAL BOWEL NUTRITION STATUS      Continent  (Please see discharge summary)  AMBULATORY STATUS COMMUNICATION OF NEEDS Skin   Limited Assist Verbally Skin abrasions (Diabetic ulcer on right foot, gauze dressing)                       Personal Care Assistance Level of Assistance  Bathing, Feeding, Dressing Bathing Assistance: Limited assistance Feeding assistance: Independent Dressing Assistance: Limited assistance     Functional Limitations Info  Sight,  Hearing, Speech Sight Info: Adequate Hearing Info: Adequate Speech Info: Adequate    SPECIAL CARE FACTORS FREQUENCY  PT (By licensed PT), OT (By licensed OT)     PT Frequency: 3x week OT Frequency: 3x week            Contractures Contractures Info: Not present    Additional Factors Info  Code Status, Allergies Code Status Info: Full Code Allergies Info: No known allergies           Current Medications (09/10/2016):  This is the current hospital active medication list Current Facility-Administered Medications  Medication Dose Route Frequency Provider Last Rate Last Dose  . 0.9 %  sodium chloride infusion  250 mL Intravenous PRN Belva Crome, MD      . acetaminophen (TYLENOL) tablet 650 mg  650 mg Oral Q4H PRN Belva Crome, MD      . allopurinol (ZYLOPRIM) tablet 300 mg  300 mg Oral Daily Rogue Bussing, MD   300 mg at 09/10/16 4431  . amLODipine (NORVASC) tablet 10 mg  10 mg Oral Daily Everrett Coombe, MD   10 mg at 09/10/16 0904  . aspirin chewable tablet 81 mg  81 mg Oral Daily Belva Crome, MD   81 mg at 09/10/16 5400  . clopidogrel (PLAVIX) tablet 75 mg  75 mg Oral Q breakfast Belva Crome, MD   75 mg at 09/10/16 0846  . hydrALAZINE (APRESOLINE) tablet 25 mg  25 mg Oral Q8H Fransisco Hertz [place], [place]   25 mg at 09/10/16 1500  . HYDROcodone-acetaminophen (NORCO/VICODIN) 5-325 MG per tablet 1-2 tablet  1-2 tablet Oral Q6H PRN Almyra Deforest, PA      . insulin aspart (novoLOG) injection 0-20 Units  0-20 Units Subcutaneous TID WC Everrett Coombe, MD   11 Units at 09/10/16 1239  . insulin aspart (novoLOG) injection 0-5 Units  0-5 Units Subcutaneous QHS Everrett Coombe, MD   3 Units at 09/09/16 2221  . insulin glargine (LANTUS) injection 15 Units  15 Units Subcutaneous QHS Everrett Coombe, MD   15 Units at 09/09/16 2221  . metoprolol succinate (TOPROL-XL) 24 hr tablet 50 mg  50 mg Oral Daily Daune Perch, NP   50 mg at 09/10/16 0904  . ondansetron (ZOFRAN) injection 4 mg  4 mg Intravenous  Q6H PRN Belva Crome, MD      . rosuvastatin (CRESTOR) tablet 20 mg  20 mg Oral q1800 Fransisco Hertz [place], [place]   20 mg at 09/09/16 1730  . silver sulfADIAZINE (SILVADENE) 1 % cream   Topical Daily Todd D McDiarmid, MD      . sodium chloride flush (NS) 0.9 % injection 3 mL  3 mL Intravenous Q12H Belva Crome, MD   3 mL at 09/10/16 1000  . sodium chloride flush (NS) 0.9 % injection 3 mL  3 mL Intravenous PRN Belva Crome, MD         Discharge Medications: Please see discharge summary for a list of discharge medications.  Relevant Imaging Results:  Relevant Lab Results:   Additional Information SSN: [id number]  Alla German, LCSW

## 2016-09-10 NOTE — Patient Outreach (Signed)
[facility]) Care Management  09/10/2016  Yenesis Even 1936-01-08 [id number]  Per chart review, patient remains inpatient status.  [name], RN BSN [place] Management Coordinator [facility] Care Management  [phone]

## 2016-09-11 LAB — BASIC METABOLIC PANEL
Anion gap: 10 (ref 5–15)
BUN: 44 mg/dL — AB (ref 6–20)
CHLORIDE: 101 mmol/L (ref 101–111)
CO2: 24 mmol/L (ref 22–32)
CREATININE: 2.43 mg/dL — AB (ref 0.44–1.00)
Calcium: 8.8 mg/dL — ABNORMAL LOW (ref 8.9–10.3)
GFR calc Af Amer: 21 mL/min — ABNORMAL LOW (ref >60)
GFR, EST NON AFRICAN AMERICAN: 18 mL/min — AB (ref >60)
GLUCOSE: 215 mg/dL — AB (ref 65–99)
Potassium: 4.1 mmol/L (ref 3.5–5.1)
SODIUM: 135 mmol/L (ref 135–145)

## 2016-09-11 LAB — GLUCOSE, CAPILLARY
GLUCOSE-CAPILLARY: 223 mg/dL — AB (ref 65–99)
Glucose-Capillary: 212 mg/dL — ABNORMAL HIGH (ref 65–99)
Glucose-Capillary: 229 mg/dL — ABNORMAL HIGH (ref 65–99)
Glucose-Capillary: 229 mg/dL — ABNORMAL HIGH (ref 65–99)

## 2016-09-11 LAB — CBC
HCT: 25 % — ABNORMAL LOW (ref 36.0–46.0)
HCT: 25.7 % — ABNORMAL LOW (ref 36.0–46.0)
Hemoglobin: 8 g/dL — ABNORMAL LOW (ref 12.0–15.0)
Hemoglobin: 8.4 g/dL — ABNORMAL LOW (ref 12.0–15.0)
MCH: 28.6 pg (ref 26.0–34.0)
MCH: 28.9 pg (ref 26.0–34.0)
MCHC: 32 g/dL (ref 30.0–36.0)
MCHC: 32.7 g/dL (ref 30.0–36.0)
MCV: 89.3 fL (ref 78.0–100.0)
MCV: 88.3 fL (ref 78.0–100.0)
PLATELETS: 192 10*3/uL (ref 150–400)
PLATELETS: 230 10*3/uL (ref 150–400)
RBC: 2.8 MIL/uL — ABNORMAL LOW (ref 3.87–5.11)
RBC: 2.91 MIL/uL — ABNORMAL LOW (ref 3.87–5.11)
RDW: 15.4 % (ref 11.5–15.5)
RDW: 15.6 % — AB (ref 11.5–15.5)
WBC: 10.8 10*3/uL — ABNORMAL HIGH (ref 4.0–10.5)
WBC: 11.4 10*3/uL — AB (ref 4.0–10.5)

## 2016-09-11 MED ORDER — INSULIN ASPART 100 UNIT/ML ~~LOC~~ SOLN
0.0000 [IU] | Freq: Three times a day (TID) | SUBCUTANEOUS | Status: DC
  Administered 2016-09-11 (×2): 7 [IU] via SUBCUTANEOUS
  Administered 2016-09-12: 4 [IU] via SUBCUTANEOUS
  Administered 2016-09-12: 7 [IU] via SUBCUTANEOUS
  Administered 2016-09-12: 4 [IU] via SUBCUTANEOUS
  Administered 2016-09-13: 7 [IU] via SUBCUTANEOUS
  Administered 2016-09-13: 4 [IU] via SUBCUTANEOUS
  Administered 2016-09-13: 7 [IU] via SUBCUTANEOUS
  Administered 2016-09-14: 11 [IU] via SUBCUTANEOUS
  Administered 2016-09-14: 7 [IU] via SUBCUTANEOUS
  Administered 2016-09-14: 4 [IU] via SUBCUTANEOUS
  Administered 2016-09-15 – 2016-09-16 (×4): 7 [IU] via SUBCUTANEOUS

## 2016-09-11 MED ORDER — INSULIN GLARGINE 100 UNIT/ML ~~LOC~~ SOLN
20.0000 [IU] | Freq: Every day | SUBCUTANEOUS | Status: DC
  Administered 2016-09-12 (×2): 20 [IU] via SUBCUTANEOUS
  Filled 2016-09-11 (×3): qty 0.2

## 2016-09-11 MED ORDER — HEPARIN SODIUM (PORCINE) 5000 UNIT/ML IJ SOLN
5000.0000 [IU] | Freq: Three times a day (TID) | INTRAMUSCULAR | Status: DC
  Administered 2016-09-11 – 2016-09-13 (×6): 5000 [IU] via SUBCUTANEOUS
  Filled 2016-09-11 (×6): qty 1

## 2016-09-11 MED ORDER — FUROSEMIDE 40 MG PO TABS
40.0000 mg | ORAL_TABLET | Freq: Once | ORAL | Status: AC
  Administered 2016-09-11: 40 mg via ORAL
  Filled 2016-09-11: qty 1

## 2016-09-11 MED ORDER — INSULIN ASPART 100 UNIT/ML ~~LOC~~ SOLN: 0.0000 [IU] | Freq: Three times a day (TID) | SUBCUTANEOUS | Status: DC

## 2016-09-11 MED ORDER — FERROUS SULFATE 325 (65 FE) MG PO TABS
325.0000 mg | ORAL_TABLET | Freq: Two times a day (BID) | ORAL | Status: DC
  Administered 2016-09-11 – 2016-09-16 (×10): 325 mg via ORAL
  Filled 2016-09-11 (×10): qty 1

## 2016-09-11 NOTE — Progress Notes (Signed)
Interventional Cardiology  Not close to DC yet.  Need to track kidney function, Creatinine seems to have crested.  Hemoglobin is low but hopefully now stable and will not require further transfusion.  Has chronic AF and needs anticoagulation resumed at some point for stroke prevention. Recommend resumption of anticoagulation when hemoglobin has clearly stabilized. Hopefully this will be in the next 48-72  hours.

## 2016-09-11 NOTE — Progress Notes (Signed)
Patient Name: Kristina Howell Date of Encounter: 09/11/2016  Primary Cardiologist: Hamilton Capri at [place], here Dr [place]  Hospital Problem List     Principal Problem:   Acute systolic heart failure [facility]) Active Problems:   Peripheral edema   Diabetes mellitus ([place])   Persistent atrial fibrillation (HCC)   Morbidly obese (HCC)   Type 2 diabetes mellitus with diabetic foot ulcer ([place])   Acute kidney injury ([place])   Dyspnea   Unstable angina (HCC)   Chronic anticoagulation   Coronary artery disease due to lipid rich plaque   Coronary artery disease involving native coronary artery of native heart with unstable angina pectoris [facility])     Patient Profile        81 y.o. female w/ hx Type 2 DM on insulin, persistent afib since 2015, morbid obesity, HTN, gout, CKD IV, and pancreatitis was admitted 08/29/2016 w/ CHF, CP. Cath w/ multi-vessel dz, s/p staged DES LAD & RCA 02/21, groin bleed after procedure, VVS saw, Fem-Stop placed.   Subjective   Received 1 dose IV lasix yesterday. Weight down 5 pounds. Breathing much better. Feels stronger. Was able to walk with CR today.  Creatinine stable 2.4 (baseline 1.8)  Inpatient Medications    Scheduled Meds: . allopurinol  300 mg Oral Daily  . amLODipine  10 mg Oral Daily  . aspirin  81 mg Oral Daily  . clopidogrel  75 mg Oral Q breakfast  . hydrALAZINE  25 mg Oral Q8H  . insulin aspart  0-20 Units Subcutaneous TID WC  . insulin aspart  0-5 Units Subcutaneous QHS  . insulin glargine  20 Units Subcutaneous QHS  . metoprolol succinate  50 mg Oral Daily  . rosuvastatin  20 mg Oral q1800  . silver sulfADIAZINE   Topical Daily  . sodium chloride flush  3 mL Intravenous Q12H   Continuous Infusions:  PRN Meds: sodium chloride, acetaminophen, HYDROcodone-acetaminophen, ondansetron (ZOFRAN) IV, sodium chloride flush   Vital Signs    Vitals:   09/11/16 0800 09/11/16 0811 09/11/16 0900 09/11/16 1137  BP: (!) 117/54  (!) 123/50   Pulse:  75  75   Resp: 19  18   Temp:  98.6 F (37 C)  97.7 F (36.5 C)  TempSrc:  Oral  Oral  SpO2: 98%  100%   Weight:      Height:        Intake/Output Summary (Last 24 hours) at 09/11/16 1207 Last data filed at 09/11/16 1000  Gross per 24 hour  Intake              804 ml  Output             1325 ml  Net             -521 ml   Filed Weights   09/09/16 1000 09/10/16 0400 09/11/16 0308  Weight: 126 kg (277 lb 12.5 oz) 129.6 kg (285 lb 11.5 oz) 127.4 kg (280 lb 12.8 oz)    Telemetry    A. Fib rates in 70s - Personally Reviewed  ECG    None since 09/09/16    Physical Exam   GEN: Obese woman sitting in chair  pale. NAD HEENT: Grossly normal.  Neck: Supple, no JVD, carotid bruits, or masses. Cardiac: RRR, no murmurs, rubs, or gallops. No clubbing, cyanosis, + pedal edema.  Radials/DP/PT 2+ and equal bilaterally.  Respiratory:  Respirations regular and unlabored, clear to auscultation bilaterally. GI: Morbidly obese. Soft, nontender, nondistended, BS +  x 4. MS: no deformity or atrophy. Skin: warm and dry, no rash. Neuro:  Strength and sensation are intact. Psych: AAOx3.  Normal affect.  Labs    CBC  Recent Labs  09/09/16 0006  09/10/16 0227 09/10/16 1411 09/11/16 0615  WBC 13.2*  < > 12.2*  --  10.8*  NEUTROABS 11.1*  --   --   --   --   HGB 9.0*  < > 8.2* 8.8* 8.0*  HCT 27.8*  < > 25.1* 26.7* 25.0*  MCV 88.0  < > 88.1  --  89.3  PLT 273  < > 231  --  192  < > = values in this interval not displayed. Basic Metabolic Panel  Recent Labs  09/10/16 0747 09/11/16 0615  NA 136 135  K 4.4 4.1  CL 105 101  CO2 24 24  GLUCOSE 203* 215*  BUN 42* 44*  CREATININE 2.44* 2.43*  CALCIUM 8.6* 8.8*    Radiology    Chest xray 2 view 08/29/16  IMPRESSION: Mild CHF with bibasilar atelectatic changes and effusions.  Cardiac Studies   Coronary stent intervention 09/08/16   CHIP due to renal insufficiency, long diffuse disease in LAD and right coronary, in an  elderly diabetic patient felt to be a poor candidate for CABG. Greatest risk is acute kidney injury and access site bleeding. Contrast during the procedure 160 cc. Small hematoma present above cath site at completion of procedure.  Successful LAD provisional stenting with reduction in 90% stenosis to 0% using a 28 x 2.75 Synergy DES postdilated to 3.0 mm in diameter. TIMI grade 2 flow was noted in the diagonal post procedure as was a case prior to the procedure.  Successful proximal to mid RCA stenting with overlapping 3.5 Promus Premier 24 and 8 mm stents. Stents were postdilated to 4.0 mm reducing stenosis to 0% with TIMI grade 3 flow.  RECOMMENDATIONS:   Sheath removal later today. Care to avoid bleeding. Discussed with floor nurse.  IV hydration with IV Lasix as needed to prevent pulmonary congestion. Will give saline at 125 cc per hour for 10 hours.  Subcutaneous heparin for DVT prophylaxis but not until a.m. at which time NOAC can be resumed if no bleeding. Will need triple drug therapy with aspirin, Plavix, and NOAC x 4 weeks then drop aspirin.  Should not be discharged until 48 hours postprocedure to monitor kidney function.  Post-Intervention Diagram        Left Heart Cath and Coronary Angiography 09/03/16  Conclusions: 1. Multivessel CAD, including 90% mid LAD stenosis involving small diagonal branch, 70% ostial/proximal ramus lesion, 30% OM stenosis and sequential 70-80% proximal/mid RCA stenoses. 2. Mildly elevated left ventricular filling pressure. 3. Mild to moderate aortic valve gradient, which is suboptimally evaluated due to heart rate variability related to atrial fibrillation. 4. Right radial artery stenosis, which is likely a combination of fixed disease and vasospasm. Stenosis was successfully navigated with angled micropuncture and Versacore wires.  Recommendations: 1. Medical optimization, including further diuresis and close monitoring of renal  function. 2. Plan for stage PCI to mid LAD and proximal/mid RCA next week when volume status has improved and renal function has improved/stabilized. 3. Restart heparin infusion 4 hours after TR band removal.  Transthoracic Echocardiography 08/30/16  Study Conclusions  - Left ventricle: The cavity size was normal. Wall thickness was   increased in a pattern of mild LVH. Severe hypokinesis of the mid   anteroseptal wall, apical septal wall, true apex, mid  to apical   anterior wall. Indeterminant diastolic function (atrial   fibrillation). The estimated ejection fraction was 45%. - Aortic valve: There was no stenosis. - Mitral valve: Mildly to moderately calcified annulus. There was   trivial regurgitation. - Left atrium: The atrium was mildly to moderately dilated. - Right ventricle: The cavity size was normal. Systolic function   was normal. - Pulmonary arteries: No complete TR doppler jet so unable to   estimate PA systolic pressure. - Systemic veins: IVC measured 2.3 cm with < 50% respirophasic   variation, suggesting RA pressure 15 mmHg.  Impressions:  - Normal LV size with mild LV hypertrophy. EF 45% with wall motion   abnormalities as described above, in LAD coronary distribution.   Normal RV size and systolic function. No significant valvular   abnormalities.  Assessment & Plan    1.CAD: s/p PCI/stents  02/21 to LAD & RCA - on ASA, Plavix - on BB (Toprol), statin (crestor)  2 Acute on chronic systolic CHF LVEF 97% with severe hypokinesis of mid anteroseptal, apical septla apical wall RVEF normal by echo. -Volume status improved. She is at baseline weight. - Switch to po lasix 40 daily (was on 20 daily at home) - Continue b-blocker and hydral - Off ACE due to AKI   3 AKI on CKD stage IV - Cr 1.7-1.8 on admit, now stable at 2.4 - ? ATN for CIN. - follow, per IM  4. Atrial fib, permanent - rate generally ok - continue BB at current dose. Off ACE-I  due to AKI.  - CHA2DS2VASc=7 (HTN, CHF, age (88), female, CAD, DM).  - pt has been off Eliquis (dose decreased to 2.5 mg bid) since 02/13 - off heparin since 02/19 - plan to keep off anticoag for at least a week with groin bleed. If restart Eliquis would resume at 5 bid - Will start SQ heparin for DVT prophylaxis  6. Groin hematoma - Hgb relatively stable. Follow per priamry team   7. DM2 - per primary team   Pt required two people to assist ambulating today. Considering discharge in 24 hours but more likely on Sunday based on how she is doing.  Treasa School, MD  09/11/2016, 12:07 PM

## 2016-09-11 NOTE — Progress Notes (Signed)
Family Medicine Teaching Service Daily Progress Note Intern Pager: [phone]  Patient name: Kristina Howell Medical record number: [id number] Date of birth: [birth year] Age: 81 y.o. Gender: female  Primary Care Provider: Jeri Modena Consultants: Cardiology  Code Status: full  Pt Overview and Major Events to Date:  08/29/2016: Admit to FPTS 09/03/2016: Left Heart Cath -- Multivessel CAD, including 90% mid LAD stenosis involving small diagonal branch, 70% ostial/proximal ramus lesion, 30% OM stenosis and sequential 70-80% proximal/mid RCA stenoses 09/08/2016: PCI 09/09/16: 1U PRBC for Hgb 7.5  Assessment and Plan: Kristina Scottonis a 81 y.o.femalepresenting with worsening dyspnea. PMH is significant for T2DM on insulin, persistent afib, morbid obesity, HTN, gout, and pancreatitis.    CAD s/p cath and PCI, stable - following cardiology recommendations.  - ASA, Plavix for now - Once Hgb has stabilized - ASA, Plavix, NOAC x4 weeks, then stop ASA - Hgb 8.0 from 8.8 yesterday, will hold off adding NOAC pending cards recs today - repeat CBC pending 2 PM   CHF diagnosis, improving: Hypervolemic. Lasix held during cath procedures and for anemia, 40 mg IV Lasix restarted yesterday with good UOP (1175ml). Daily wts 280 (Dry weight 270).  Cr 2.43, stable from yesterday. - 40 mg Lasix PO today and reassess volume status daily   Acute Blood Loss Anemia, improving - 2/2 hematoma after cardiac cath. s/p 1U PRBC 2/22, hgb 8.0 this AM from 8.8 yesterday PM. Following cardiology recommendations. - following cards recs: Can restart heparin for DVT prophylaxis - if no bleeding, can resume NOAC (Eliquis) today - 2 PM CBC for Hgb recheck   T2DM, stable:  Home lantus 20u nightly. CBG 205 overnight. 27u novolog used yesterday and 15U lantus.  - continue resistant sliding scale - lantus 15u >> lantus 20u QHS   HTN, acutely hypotensive with blood loss, now stable - - hold benazepril, consider  switching to losartan at DC for uric acid lowering properties - cont hydralazine 25 mg TID, metoprolol XL 50 mg, norvasc 10 mg (new this admit while holding ACE) - monitor BP   AKI, stable - SCr 1.84 >>>2.43Baseline may be ~ 1.0 (2015).  Worsened in setting of recent diuresis, CHF, also PCI stent with contrast and episode of acute hypotension - Hold home ACE until SCr improves - AM BMP   Hypercholesterolemia, stable LDL 180 - cont Crestor 20 mg qd  R foot ulcer, stable Callused, does not appear infected. - Consult WOC while inpatient  Persistent afib, stable Rate controlled - holding heparin for acute bleed, following cards recs - cont home toprol 50 mg  Hx of gout, stable  Continue home allopurinol  FEN/GI: Heart healthy/carb modified, SLIV Prophylaxis: On eliquis  Disposition: PT recommends home health SNF  Subjective:  Patient did well overnight with no acute events. States she will consider SNF but would much rather go home and has supervision (her husband) there.  States her pain is well-controlled this AM. Denies dyspnea.  Objective: Temp:  [97.6 F (36.4 C)-98.6 F (37 C)] 97.8 F (36.6 C) (02/24 0300) Pulse Rate:  [66-97] 73 (02/24 0700) Resp:  [18-30] 19 (02/24 0700) BP: (102-154)/(33-95) 123/49 (02/24 0700) SpO2:  [89 %-100 %] 98 % (02/24 0700) Weight:  [280 lb 12.8 oz (127.4 kg)] 280 lb 12.8 oz (127.4 kg) (02/24 0308) Physical Exam: General: NAD, sleeping comfortably, wakes easily Cardiovascular: RRR, no m/r/g Respiratory: CTA bilaterally, no W/R/R Abdomen:  +ecchymosis and tender hematoma RLQ, otherwise abdomen is soft and nontender, nondistended Extremities: 1-2+ pitting edema noted  over shins bilaterally Derm: chronic hyperpigmentation venous changes over lower leg   Laboratory:  Recent Labs Lab 09/09/16 1300  09/10/16 0227 09/10/16 1411 09/11/16 0615  WBC 13.4*  --  12.2*  --  10.8*  HGB 7.5*  < > 8.2* 8.8* 8.0*  HCT 23.3*  < > 25.1*  26.7* 25.0*  PLT 226  --  231  --  192  < > = values in this interval not displayed.  Recent Labs Lab 09/10/16 0227 09/10/16 0747 09/11/16 0615  NA 134* 136 135  K 4.3 4.4 4.1  CL 103 105 101  CO2 24 24 24   BUN 44* 42* 44*  CREATININE 2.41* 2.44* 2.43*  CALCIUM 8.6* 8.6* 8.8*  GLUCOSE 247* 203* 215*    Imaging/Diagnostic Tests: Cardiac Echo 08/30/2016 - Left ventricle: The cavity size was normal. Wall thickness was increased in a pattern of mild LVH. Severe hypokinesis of the mid anteroseptal wall, apical septal wall, true apex, mid to apical anterior wall. Indeterminant diastolic function (atrial fibrillation). The estimated ejection fraction was 45%. - Aortic valve: There was no stenosis. - Mitral valve: Mildly to moderately calcified annulus. There was trivial regurgitation. - Left atrium: The atrium was mildly to moderately dilated. - Right ventricle: The cavity size was normal. Systolic function was normal. - Pulmonary arteries: No complete TR doppler jet so unable to estimate PA systolic pressure. - Systemic veins: IVC measured 2.3 cm with < 50% respirophasic variation, suggesting RA pressure 15 mmHg.  Impressions: - Normal LV size with mild LV hypertrophy. EF 45% with wall motion abnormalities as described above, in LAD coronary distribution. Normal RV size and systolic function. No significant valvular abnormalities.   Everrett Coombe, MD 09/11/2016, 7:34 AM PGY-1, [place] Intern pager: [phone], text pages welcome

## 2016-09-11 NOTE — Progress Notes (Signed)
CARDIAC REHAB PHASE I   PRE:  Rate/Rhythm: 65  BP:  Sitting: 112/41     SaO2: 100ra  MODE:  Ambulation: 40 ft   POST:  Rate/Rhythm: 91  BP:  Sitting: 151/45     SaO2: 100ra  10:05am-10:30am Patient ambulated with walker and x2 assist. Complained of usual back pain. Had to stop a few times for a rest break. Used bedside commode to have a bm. Patient assisted back to chair with feet up and call bell in reach.   G[place], MS 09/11/2016 10:26 AM

## 2016-09-12 LAB — CBC
HCT: 24.7 % — ABNORMAL LOW (ref 36.0–46.0)
Hemoglobin: 8 g/dL — ABNORMAL LOW (ref 12.0–15.0)
MCH: 29 pg (ref 26.0–34.0)
MCHC: 32.4 g/dL (ref 30.0–36.0)
MCV: 89.5 fL (ref 78.0–100.0)
PLATELETS: 250 10*3/uL (ref 150–400)
RBC: 2.76 MIL/uL — AB (ref 3.87–5.11)
RDW: 15.5 % (ref 11.5–15.5)
WBC: 9.9 10*3/uL (ref 4.0–10.5)

## 2016-09-12 LAB — GLUCOSE, CAPILLARY
GLUCOSE-CAPILLARY: 180 mg/dL — AB (ref 65–99)
GLUCOSE-CAPILLARY: 230 mg/dL — AB (ref 65–99)
GLUCOSE-CAPILLARY: 245 mg/dL — AB (ref 65–99)
Glucose-Capillary: 192 mg/dL — ABNORMAL HIGH (ref 65–99)

## 2016-09-12 LAB — BASIC METABOLIC PANEL
ANION GAP: 10 (ref 5–15)
BUN: 48 mg/dL — ABNORMAL HIGH (ref 6–20)
CALCIUM: 8.7 mg/dL — AB (ref 8.9–10.3)
CO2: 23 mmol/L (ref 22–32)
Chloride: 99 mmol/L — ABNORMAL LOW (ref 101–111)
Creatinine, Ser: 2.52 mg/dL — ABNORMAL HIGH (ref 0.44–1.00)
GFR, EST AFRICAN AMERICAN: 20 mL/min — AB (ref >60)
GFR, EST NON AFRICAN AMERICAN: 17 mL/min — AB (ref >60)
Glucose, Bld: 194 mg/dL — ABNORMAL HIGH (ref 65–99)
POTASSIUM: 3.9 mmol/L (ref 3.5–5.1)
SODIUM: 132 mmol/L — AB (ref 135–145)

## 2016-09-12 MED ORDER — FUROSEMIDE 10 MG/ML IJ SOLN
40.0000 mg | Freq: Once | INTRAMUSCULAR | Status: AC
  Administered 2016-09-12: 40 mg via INTRAVENOUS
  Filled 2016-09-12: qty 4

## 2016-09-12 NOTE — Progress Notes (Signed)
Family Medicine Teaching Service Daily Progress Note Intern Pager: [phone]  Patient name: Kristina Howell Medical record number: [id number] Date of birth: 03/25/1936 Age: 81 y.o. Gender: female  Primary Care Provider: Jeri Modena Consultants: Cardiology  Code Status: full  Pt Overview and Major Events to Date:  08/29/2016: Admit to FPTS 09/03/2016: Left Heart Cath -- Multivessel CAD, including 90% mid LAD stenosis involving small diagonal branch, 70% ostial/proximal ramus lesion, 30% OM stenosis and sequential 70-80% proximal/mid RCA stenoses 09/08/2016: PCI 09/09/16: 1U PRBC for Hgb 7.5  Assessment and Plan: Auden Scottonis a 81 y.o.femalepresenting with worsening dyspnea. PMH is significant for T2DM on insulin, persistent afib, morbid obesity, HTN, gout, and pancreatitis.   CAD s/p cath and PCI, stable - following cardiology recommendations.  - ASA, Plavix for now - Once Hgb has stabilized - ASA, Plavix, (one week s/p bleed) NOAC x4 weeks, then stop ASA - Hgb 8.0 from 8.8 yesterday, will hold off adding NOAC pending cards recs  CHF, improving: Hypervolemic. Lasix held during cath procedures and for anemia. Transitioned to 40 mg PO Lasix 2/24 with poor UOP 825 (0.3 ml/kg/hr) yesterday.  Daily wts 281lb (Dry weight 270).  Cr 2.52 from 2.43 yesterday, may be residual injury from dye load this week. - will give 40 IV lasix x1 today and reassess Cr and volume status daily  Acute Blood Loss Anemia, improving - 2/2 hematoma after cardiac cath. s/p 1U PRBC 2/22, hgb stable at 8.0 this AM - plan to keep off anticoag for at least a week with groin bleed, then resume eliquis at 5 BID - restarted SQ heparin for DVT prophylaxis 2/24  T2DM, stable:  Home lantus 20u nightly. CBG 229, 194, 180 overnight. 23u novolog used yesterday and 20u lantus.  - continue resistant sliding scale - lantus 20u QHS  HTN, acutely hypotensive with blood loss, now stable - - hold benazepril, consider  switching to losartan at DC for uric acid lowering properties - cont hydralazine 25 mg TID, metoprolol XL 50 mg, norvasc 10 mg (new this admit while holding ACE) - monitor BP  AKI, stable - SCr 1.84 >>>2.53Baseline may be ~ 1.8. Worsened in setting of recent diuresis, CHF, also PCI stent with contrast and episode of acute hypotension - Hold home ACE until SCr improves - AM BMP  Hypercholesterolemia, stable LDL 180 - cont Crestor 20 mg qd  R foot ulcer, stable Callused, does not appear infected. - Consult WOC while inpatient  Persistent afib, stable Rate controlled - holding heparin for acute bleed, following cards recs - cont home toprol 50 mg  Hx of gout, stable  Continue home allopurinol  FEN/GI: Heart healthy/carb modified, SLIV Prophylaxis: On eliquis  Disposition: PT recommends home health SNF  Subjective:  Patient did well overnight with no acute events. She is sleepy on exam. She states her pain is well controlled.  Objective: Temp:  [97.7 F (36.5 C)-98.2 F (36.8 C)] 97.7 F (36.5 C) (02/25 0332) Pulse Rate:  [63-75] 74 (02/25 0949) Resp:  [16-24] 18 (02/25 0332) BP: (103-154)/(35-84) 122/52 (02/25 0949) SpO2:  [96 %-100 %] 99 % (02/25 0949) Weight:  [281 lb 8 oz (127.7 kg)-283 lb 1.6 oz (128.4 kg)] 281 lb 8 oz (127.7 kg) (02/25 9381) Physical Exam: General: NAD, sleeping, easy to wake Cardiovascular: RRR, no m/r/g Respiratory: CTA bilaterally, no W/R/R Abdomen:  +ecchymosis and tender hematoma RLQ, otherwise abdomen is soft and nontender, nondistended Extremities: 2+ pitting edema noted over shins bilaterally Derm: chronic hyperpigmentation venous changes over  lower leg   Laboratory:  Recent Labs Lab 09/11/16 0615 09/11/16 1511 09/12/16 0353  WBC 10.8* 11.4* 9.9  HGB 8.0* 8.4* 8.0*  HCT 25.0* 25.7* 24.7*  PLT 192 230 250    Recent Labs Lab 09/10/16 0747 09/11/16 0615 09/12/16 0353  NA 136 135 132*  K 4.4 4.1 3.9  CL 105 101 99*  CO2  24 24 23   BUN 42* 44* 48*  CREATININE 2.44* 2.43* 2.52*  CALCIUM 8.6* 8.8* 8.7*  GLUCOSE 203* 215* 194*    Imaging/Diagnostic Tests: Cardiac Echo 08/30/2016 - Left ventricle: The cavity size was normal. Wall thickness was increased in a pattern of mild LVH. Severe hypokinesis of the mid anteroseptal wall, apical septal wall, true apex, mid to apical anterior wall. Indeterminant diastolic function (atrial fibrillation). The estimated ejection fraction was 45%. - Aortic valve: There was no stenosis. - Mitral valve: Mildly to moderately calcified annulus. There was trivial regurgitation. - Left atrium: The atrium was mildly to moderately dilated. - Right ventricle: The cavity size was normal. Systolic function was normal. - Pulmonary arteries: No complete TR doppler jet so unable to estimate PA systolic pressure. - Systemic veins: IVC measured 2.3 cm with < 50% respirophasic variation, suggesting RA pressure 15 mmHg.  Impressions: - Normal LV size with mild LV hypertrophy. EF 45% with wall motion abnormalities as described above, in LAD coronary distribution. Normal RV size and systolic function. No significant valvular abnormalities.   Everrett Coombe, MD 09/12/2016, 10:27 AM PGY-1, [place] Intern pager: [phone], text pages welcome

## 2016-09-12 NOTE — Progress Notes (Signed)
Pt. ambulated to the bathroom placed sacral drsg. And barrier cream pt c/o soreness to buttocks.

## 2016-09-12 NOTE — Progress Notes (Signed)
Pt repositioned in bed, no c/o pain. Large hematoma to rt groin extending to rt flank. No signs of active bleeding. Rt foot drsg in place.

## 2016-09-12 NOTE — Progress Notes (Signed)
Subjective:    No chest pain, no SOB  Objective:   Temp:  [97.7 F (36.5 C)-98.2 F (36.8 C)] 97.7 F (36.5 C) (02/25 0332) Pulse Rate:  [63-74] 74 (02/25 0949) Resp:  [16-24] 18 (02/25 0332) BP: (103-154)/(40-84) 122/52 (02/25 0949) SpO2:  [96 %-100 %] 99 % (02/25 0949) Weight:  [281 lb 8 oz (127.7 kg)-283 lb 1.6 oz (128.4 kg)] 281 lb 8 oz (127.7 kg) (02/25 0332) Last BM Date: 09/11/16  Filed Weights   09/11/16 0308 09/11/16 1622 09/12/16 0332  Weight: 280 lb 12.8 oz (127.4 kg) 283 lb 1.6 oz (128.4 kg) 281 lb 8 oz (127.7 kg)    Intake/Output Summary (Last 24 hours) at 09/12/16 1217 Last data filed at 09/12/16 0954  Gross per 24 hour  Intake              700 ml  Output              900 ml  Net             -200 ml    Telemetry: afib  Exam:  General: NAD  HEENT: sclera clear, throat clear  Resp: CTAB  Cardiac: irreg, no m/r/g, no jvd  PZ:WCHENID soft, NT, ND  MSK:1+ bilateral LE edema  Neuro: no focal deficits  Psych: appropriate affect  Lab Results:  Basic Metabolic Panel:  Recent Labs Lab 09/10/16 0747 09/11/16 0615 09/12/16 0353  NA 136 135 132*  K 4.4 4.1 3.9  CL 105 101 99*  CO2 24 24 23   GLUCOSE 203* 215* 194*  BUN 42* 44* 48*  CREATININE 2.44* 2.43* 2.52*  CALCIUM 8.6* 8.8* 8.7*    Liver Function Tests: No results for input(s): AST, ALT, ALKPHOS, BILITOT, PROT, ALBUMIN in the last 168 hours.  CBC:  Recent Labs Lab 09/11/16 0615 09/11/16 1511 09/12/16 0353  WBC 10.8* 11.4* 9.9  HGB 8.0* 8.4* 8.0*  HCT 25.0* 25.7* 24.7*  MCV 89.3 88.3 89.5  PLT 192 230 250    Cardiac Enzymes: No results for input(s): CKTOTAL, CKMB, CKMBINDEX, TROPONINI in the last 168 hours.  BNP: No results for input(s): PROBNP in the last 8760 hours.  Coagulation:  Recent Labs Lab 09/07/16 1526  INR 1.04    ECG:   Medications:   Scheduled Medications: . allopurinol  300 mg Oral Daily  . amLODipine  10 mg Oral Daily  . aspirin  81 mg  Oral Daily  . clopidogrel  75 mg Oral Q breakfast  . ferrous sulfate  325 mg Oral BID WC  . furosemide  40 mg Intravenous Once  . heparin  5,000 Units Subcutaneous Q8H  . hydrALAZINE  25 mg Oral Q8H  . insulin aspart  0-20 Units Subcutaneous TID WC  . insulin aspart  0-5 Units Subcutaneous QHS  . insulin glargine  20 Units Subcutaneous QHS  . metoprolol succinate  50 mg Oral Daily  . rosuvastatin  20 mg Oral q1800  . silver sulfADIAZINE   Topical Daily  . sodium chloride flush  3 mL Intravenous Q12H     Infusions:   PRN Medications:  sodium chloride, acetaminophen, HYDROcodone-acetaminophen, ondansetron (ZOFRAN) IV, sodium chloride flush     Assessment/Plan   1. CAD - s/p stents to LAD and RCA on 09/08/16 - medical therapy with ASA 81, plavix 75, Toprol XL 50, crestor 20. No ACE due to poor renal function  2. Acute on chronic systolic HF - echo 7/82/42 LVEF 45%, Severe hypokinesis  of the mid   anteroseptal wall, apical septal wall, true apex, mid to apical   anterior wall - Toprol XL 50. No ACE/ARB due to poor renal function.  - patient for IV dose of lasix 40mg  this AM per primary team. Would consider changing to oral tomorrow.   3. AKI on CKD - followed by primary team.   4. Afib - per notes off anticoag due to recent groin bleed.  - rate control with Toprol XL  5. Groin hematoma - 09/09/16 [place] with right hip hematoma.  - off eliquis at this time. Initial plan per interventional cardiology was triple therapy x 4 weeks with ASA, NOAC, plavix. - continue to hold NOAC at least 1 week, consider restarting around 09/15/16 if counts remains stable and clinically stable.     Carlyle Dolly, M.D.

## 2016-09-13 LAB — CBC
HCT: 23 % — ABNORMAL LOW (ref 36.0–46.0)
HEMATOCRIT: 27.8 % — AB (ref 36.0–46.0)
HEMOGLOBIN: 9.1 g/dL — AB (ref 12.0–15.0)
Hemoglobin: 7.5 g/dL — ABNORMAL LOW (ref 12.0–15.0)
MCH: 29.1 pg (ref 26.0–34.0)
MCH: 28.6 pg (ref 26.0–34.0)
MCHC: 32.6 g/dL (ref 30.0–36.0)
MCHC: 32.7 g/dL (ref 30.0–36.0)
MCV: 89.1 fL (ref 78.0–100.0)
MCV: 87.4 fL (ref 78.0–100.0)
PLATELETS: 240 10*3/uL (ref 150–400)
Platelets: 254 10*3/uL (ref 150–400)
RBC: 2.58 MIL/uL — ABNORMAL LOW (ref 3.87–5.11)
RBC: 3.18 MIL/uL — ABNORMAL LOW (ref 3.87–5.11)
RDW: 15.7 % — AB (ref 11.5–15.5)
RDW: 15.7 % — ABNORMAL HIGH (ref 11.5–15.5)
WBC: 9.4 10*3/uL (ref 4.0–10.5)
WBC: 10.7 10*3/uL — AB (ref 4.0–10.5)

## 2016-09-13 LAB — TYPE AND SCREEN
BLOOD PRODUCT EXPIRATION DATE: 201803132359
Blood Product Expiration Date: 201803142359
ISSUE DATE / TIME: 201802221611
Unit Type and Rh: 6200
Unit Type and Rh: 6200

## 2016-09-13 LAB — BASIC METABOLIC PANEL
Anion gap: 12 (ref 5–15)
BUN: 45 mg/dL — AB (ref 6–20)
CALCIUM: 8.9 mg/dL (ref 8.9–10.3)
CO2: 23 mmol/L (ref 22–32)
CREATININE: 2.43 mg/dL — AB (ref 0.44–1.00)
Chloride: 98 mmol/L — ABNORMAL LOW (ref 101–111)
GFR calc Af Amer: 21 mL/min — ABNORMAL LOW (ref >60)
GFR calc non Af Amer: 18 mL/min — ABNORMAL LOW (ref >60)
GLUCOSE: 197 mg/dL — AB (ref 65–99)
Potassium: 3.7 mmol/L (ref 3.5–5.1)
Sodium: 133 mmol/L — ABNORMAL LOW (ref 135–145)

## 2016-09-13 LAB — GLUCOSE, CAPILLARY
GLUCOSE-CAPILLARY: 229 mg/dL — AB (ref 65–99)
Glucose-Capillary: 194 mg/dL — ABNORMAL HIGH (ref 65–99)
Glucose-Capillary: 223 mg/dL — ABNORMAL HIGH (ref 65–99)
Glucose-Capillary: 212 mg/dL — ABNORMAL HIGH (ref 65–99)

## 2016-09-13 LAB — POCT ACTIVATED CLOTTING TIME
Activated Clotting Time: 186 seconds
Activated Clotting Time: 153 seconds

## 2016-09-13 LAB — PREPARE RBC (CROSSMATCH)

## 2016-09-13 MED ORDER — ROSUVASTATIN CALCIUM 10 MG PO TABS
10.0000 mg | ORAL_TABLET | Freq: Every day | ORAL | Status: DC
  Administered 2016-09-13 – 2016-09-15 (×3): 10 mg via ORAL
  Filled 2016-09-13 (×3): qty 1

## 2016-09-13 MED ORDER — INSULIN GLARGINE 100 UNIT/ML ~~LOC~~ SOLN
25.0000 [IU] | Freq: Every day | SUBCUTANEOUS | Status: DC
  Administered 2016-09-13: 25 [IU] via SUBCUTANEOUS
  Filled 2016-09-13: qty 0.25

## 2016-09-13 MED ORDER — HEPARIN SODIUM (PORCINE) 5000 UNIT/ML IJ SOLN
5000.0000 [IU] | Freq: Three times a day (TID) | INTRAMUSCULAR | Status: AC
  Administered 2016-09-13 – 2016-09-14 (×5): 5000 [IU] via SUBCUTANEOUS
  Filled 2016-09-13 (×5): qty 1

## 2016-09-13 MED ORDER — SODIUM CHLORIDE 0.9 % IV SOLN
Freq: Once | INTRAVENOUS | Status: AC
  Administered 2016-09-13: 10 mL/h via INTRAVENOUS

## 2016-09-13 MED ORDER — FUROSEMIDE 10 MG/ML IJ SOLN
40.0000 mg | Freq: Once | INTRAMUSCULAR | Status: AC
  Administered 2016-09-13: 40 mg via INTRAVENOUS
  Filled 2016-09-13: qty 4

## 2016-09-13 NOTE — Progress Notes (Signed)
Family Medicine Teaching Service Daily Progress Note Intern Pager: [phone]  Patient name: Kristina Howell Medical record number: [id number] Date of birth: Nov 12, 1935 Age: 81 y.o. Gender: female  Primary Care Provider: Jeri Modena Consultants: Cardiology  Code Status: full  Pt Overview and Major Events to Date:  08/29/2016: Admit to FPTS 09/03/2016: Left Heart Cath -- Multivessel CAD, including 90% mid LAD stenosis involving small diagonal branch, 70% ostial/proximal ramus lesion, 30% OM stenosis and sequential 70-80% proximal/mid RCA stenoses 09/08/2016: PCI 09/09/16: 1U PRBC for Hgb 7.5 2/26 1u PRBC for Hgb 7.5  Assessment and Plan: Mckensey Scottonis a 81 y.o.femalepresenting with worsening dyspnea. PMH is significant for T2DM on insulin, persistent afib, morbid obesity, HTN, gout, and pancreatitis.   Acute Anemia, worse - Hgb low 7.5-8.4 since cardiac cath 2/21 with right groin hematoma. S/p 1U PRBC 2/22. Hgb low at 7.5 from 8.0 yesterday. Had been using 7.5 as transfusion threshold after cath after speaking with Dr. Tamala Julian about this patient's care. - plan to keep off anticoag for at least a week with groin bleed - restarted SQ heparin for DVT prophylaxis 2/24>>>transition back to SCDs today - Hgb trending down 8.4>8.0>7.5 today - transfusion 1u PRBC today  CAD s/p cath and PCI, stable - following cardiology recommendations.  - ASA, Plavix for one week until Hgb stable  - Plan per cards:  09/15/16 start Eliquis (5 mg BID) and continue triple therapy x4 weeks (3/28), then stop ASA  CHF, improving: Hypervolemic. S/p 40 mg IV Lasix yesterday.  UOP 1325 ml. Daily wts 281lb unchanged from yesterday (Dry weight 270).  Cr 2.43 improved from 2.52, may be residual injury from dye load this week.  -  40 mg IV lasix after transfusion today  AKI, improving - SCr 2.43down from 2.52 yesterday. Baseline may be ~ 1.8. Worsened in setting of recent diuresis, CHF, also PCI stent with  contrast and episode of acute hypotension - Hold home ACE until SCr improves - AM BMP  T2DM, stable:  Home lantus 20u nightly. CBG 230, 245 overnight. 17u novolog used.  - continue resistant sliding scale - increase lantus 20u QHS >> Lantus 25u QHS  HTN, acutely hypotensive with blood loss, now stable - - hold benazepril for AKI, consider switching to losartan at DC for uric acid lowering properties - cont hydralazine 25 mg TID, metoprolol XL 50 mg, norvasc 10 mg (new this admit while holding ACE) - monitor BP  Hypercholesterolemia, stable LDL 180 - Crestor 20 mg qd >> reduce 10 mg QD for poor renal function  R foot ulcer, stable Callused, does not appear infected. - Wound care   Persistent afib, stable Rate controlled - holding heparin for acute bleed, following cards recs - cont home toprol 50 mg  Hx of gout, stable  Continue home allopurinol  FEN/GI: Heart healthy/carb modified, SLIV Prophylaxis: On eliquis  Disposition: PT recommends home health SNF  Subjective:  Patient did well overnight with no acute events. She is sleepy on exam. She states that she feels weak, but she is able to ambulate to the bathroom. She denies any signs of bleeding. Her primary concern is that she's been having pain with needle sticks with blood draws.  Objective: Temp:  [97.8 F (36.6 C)-98.6 F (37 C)] 98.5 F (36.9 C) (02/26 0545) Pulse Rate:  [64-74] 68 (02/26 0545) Resp:  [18] 18 (02/26 0545) BP: (118-149)/(33-52) 140/51 (02/26 0545) SpO2:  [99 %-100 %] 99 % (02/26 0545) Weight:  [281 lb 11.2 oz (127.8 kg)]  281 lb 11.2 oz (127.8 kg) (02/26 0545) Physical Exam: General: NAD, sitting up in chair, very pale Cardiovascular: RRR, no m/r/g Respiratory: CTA bilaterally, no W/R/R Abdomen:  +ecchymosis and tender hematoma RLQ extending to R flank, otherwise abdomen is soft and nontender, nondistended Extremities:1- 2+ pitting edema noted over shins bilaterally Derm: chronic  hyperpigmentation venous changes over lower leg   Laboratory:  Recent Labs Lab 09/11/16 1511 09/12/16 0353 09/13/16 0420  WBC 11.4* 9.9 9.4  HGB 8.4* 8.0* 7.5*  HCT 25.7* 24.7* 23.0*  PLT 230 250 240    Recent Labs Lab 09/11/16 0615 09/12/16 0353 09/13/16 0420  NA 135 132* 133*  K 4.1 3.9 3.7  CL 101 99* 98*  CO2 24 23 23   BUN 44* 48* 45*  CREATININE 2.43* 2.52* 2.43*  CALCIUM 8.8* 8.7* 8.9  GLUCOSE 215* 194* 197*    Imaging/Diagnostic Tests: Cardiac Echo 08/30/2016 - Left ventricle: The cavity size was normal. Wall thickness was increased in a pattern of mild LVH. Severe hypokinesis of the mid anteroseptal wall, apical septal wall, true apex, mid to apical anterior wall. Indeterminant diastolic function (atrial fibrillation). The estimated ejection fraction was 45%. - Aortic valve: There was no stenosis. - Mitral valve: Mildly to moderately calcified annulus. There was trivial regurgitation. - Left atrium: The atrium was mildly to moderately dilated. - Right ventricle: The cavity size was normal. Systolic function was normal. - Pulmonary arteries: No complete TR doppler jet so unable to estimate PA systolic pressure. - Systemic veins: IVC measured 2.3 cm with < 50% respirophasic variation, suggesting RA pressure 15 mmHg.  Impressions: - Normal LV size with mild LV hypertrophy. EF 45% with wall motion abnormalities as described above, in LAD coronary distribution. Normal RV size and systolic function. No significant valvular abnormalities.   Everrett Coombe, MD 09/13/2016, 9:25 AM PGY-1, [place] Intern pager: [phone], text pages welcome

## 2016-09-13 NOTE — Progress Notes (Addendum)
Subjective:    Discomfort in the right groin and hip area is improved compared to last week. Complains of headache currently. Dyspneic when she ambulates. Sitting in a recliner. Says she is sleep deprived.  Objective:   Temp:  [97.8 F (36.6 C)-98.6 F (37 C)] 98.5 F (36.9 C) (02/26 0545) Pulse Rate:  [64-70] 68 (02/26 0545) Resp:  [18] 18 (02/26 0545) BP: (118-149)/(33-51) 140/51 (02/26 0545) SpO2:  [99 %-100 %] 99 % (02/26 0545) Weight:  [281 lb 11.2 oz (127.8 kg)] 281 lb 11.2 oz (127.8 kg) (02/26 0545) Last BM Date: 09/11/16  Filed Weights   09/11/16 1622 09/12/16 0332 09/13/16 0545  Weight: 283 lb 1.6 oz (128.4 kg) 281 lb 8 oz (127.7 kg) 281 lb 11.2 oz (127.8 kg)    Intake/Output Summary (Last 24 hours) at 09/13/16 1052 Last data filed at 09/13/16 0917  Gross per 24 hour  Intake             1060 ml  Output             1025 ml  Net               35 ml    TELEMETRY :   afib with controlled rate. - Personally reviewed.  Exam:  General: NAD. Pale.  HEENT: sclera clear, throat clear  Resp: Clear to auscultation except posterior at bases. Faint rales are heard.  Cardiac: irreg, no m/r/g, no jvd  XL:KGMWNUU soft, NT, ND. Large right flank and hip area hematoma, softer now compared to prior.  MSK: 2+ versus 1+ lower extremity edema right versus left.  Neuro: no focal deficits  Psych: Depressed  Lab Results:  Basic Metabolic Panel:  Recent Labs Lab 09/11/16 0615 09/12/16 0353 09/13/16 0420  NA 135 132* 133*  K 4.1 3.9 3.7  CL 101 99* 98*  CO2 24 23 23   GLUCOSE 215* 194* 197*  BUN 44* 48* 45*  CREATININE 2.43* 2.52* 2.43*  CALCIUM 8.8* 8.7* 8.9    Liver Function Tests: No results for input(s): AST, ALT, ALKPHOS, BILITOT, PROT, ALBUMIN in the last 168 hours.  CBC:  Recent Labs Lab 09/11/16 1511 09/12/16 0353 09/13/16 0420  WBC 11.4* 9.9 9.4  HGB 8.4* 8.0* 7.5*  HCT 25.7* 24.7* 23.0*  MCV 88.3 89.5 89.1  PLT 230 250 240    Cardiac  Enzymes: No results for input(s): CKTOTAL, CKMB, CKMBINDEX, TROPONINI in the last 168 hours.  BNP: No results for input(s): PROBNP in the last 8760 hours.  Coagulation:  Recent Labs Lab 09/07/16 1526  INR 1.04   Coronary Angiography/PCI: 09/08/16: Diagnostic Diagram     Post-Intervention Diagram         ECG:   Medications:   Scheduled Medications: . sodium chloride   Intravenous Once  . allopurinol  300 mg Oral Daily  . amLODipine  10 mg Oral Daily  . aspirin  81 mg Oral Daily  . clopidogrel  75 mg Oral Q breakfast  . ferrous sulfate  325 mg Oral BID WC  . furosemide  40 mg Intravenous Once  . hydrALAZINE  25 mg Oral Q8H  . insulin aspart  0-20 Units Subcutaneous TID WC  . insulin aspart  0-5 Units Subcutaneous QHS  . insulin glargine  25 Units Subcutaneous QHS  . metoprolol succinate  50 mg Oral Daily  . rosuvastatin  10 mg Oral q1800  . silver sulfADIAZINE   Topical Daily  . sodium chloride flush  3 mL Intravenous Q12H    Infusions:   PRN Medications: sodium chloride, acetaminophen, HYDROcodone-acetaminophen, ondansetron (ZOFRAN) IV, sodium chloride flush     Assessment/Plan   1. CAD - s/p stents to LAD and RCA on 09/08/16 - medical therapy with ASA 81, plavix 75, Toprol XL 50, crestor 20. No ACE due to poor renal function  2. Acute on chronic systolic HF - echo 7/35/67 LVEF 45%, Severe hypokinesis of the mid   anteroseptal wall, apical septal wall, true apex, mid to apical   anterior wall - Weight on the date of PCI (2/21) was 275 pounds. Weight today 281 pounds. - Toprol XL 50. No ACE/ARB due to poor renal function.  - patient for IV dose of lasix 40mg  this AM per primary team. Would consider changing to oral tomorrow.   3. AKI on CKD - Until creatinine trends downward, would not give additional Lasix.  4. Afib - per notes off anticoag due to recent groin bleed. Resume anticoagulation full dose on Wednesday, 09/15/16 - rate control with Toprol  XL .  5. Groin hematoma - 09/09/16 [place] with right hip hematoma.   - Continue DVT prophy - off eliquis at this time. Initial plan per interventional cardiology was triple therapy x 4 weeks with ASA, NOAC, plavix. - continue to hold NOAC at least 1 week, consider restarting around 09/15/16 if counts remains stable and clinically stable.   6. Anemia - Relatively stable hemoglobin: - Hgb 2/22  8.7>>8.2 (2/23) >> 8.0 (2/24)>> 8.0 (2/25) >> 7.5 (today) - Continue to follow.  Illene Labrador, III, MD [facility]

## 2016-09-13 NOTE — Care Management Important Message (Signed)
Important Message  Patient Details  Name: Kristina Howell MRN: [id number] Date of Birth: [birth year]   Medicare Important Message Given:  Yes    Zarra Geffert 09/13/2016, 2:30 PM

## 2016-09-13 NOTE — Care Management Note (Signed)
Case Management Note  Patient Details  Name: Kristina Howell MRN: [id number] Date of Birth: 20-Dec-1935  Subjective/Objective:                    Action/Plan: Pt continues to receive IV lasix. Plan is for SNF at d/c. CSW and CM following.  Expected Discharge Date:  09/01/16               Expected Discharge Plan:  Skilled Nursing Facility  In-House Referral:  Clinical Social Work  Discharge planning Services  CM Consult  Post Acute Care Choice:    Choice offered to:     DME Arranged:    DME Agency:     HH Arranged:  PT, OT [place] Agency:  [place]  Status of Service:  In process, will continue to follow  If discussed at Long Length of Stay Meetings, dates discussed:    Additional Comments:  Pollie Friar, RN 09/13/2016, 2:39 PM

## 2016-09-13 NOTE — Progress Notes (Signed)
Physical Therapy Treatment Patient Details Name: Kristina Howell MRN: [id number] DOB: [birth year] Today's Date: 09/13/2016    History of Present Illness 81 y.o. female presenting with worsening dyspnea and admitted with CHF exacerbation. s/p stent with groin hematoma complicating hospital course. PMH is significant for T2DM on insulin, persistent afib, morbid obesity, HTN, gout, and pancreatitis.      PT Comments    Pt with improving mobility but continued poor activity tolerance. Pt very reluctant to go to SNF due to husband's prior experience at a SNF. Pt wishes to go home with assistance of husband and her granddaughter. Pt has lift chair, walker, bsc at home and realizes she will have to pace herself. Feel she could benefit from SNF but that pt has reasonable plan for return home.   Follow Up Recommendations  SNF (Pt not agreeable to this so would do HHPT)     Equipment Recommendations  None recommended by PT    Recommendations for Other Services       Precautions / Restrictions Precautions Precautions: Fall Restrictions Weight Bearing Restrictions: No    Mobility  Bed Mobility               General bed mobility comments: pt in chair  Transfers Overall transfer level: Needs assistance Equipment used: Rolling walker (2 wheeled) Transfers: Sit to/from Stand Sit to Stand: Min assist         General transfer comment: Assist to bring hips up from recliner  Ambulation/Gait Ambulation/Gait assistance: Min guard Ambulation Distance (Feet): 100 Feet Assistive device: Rolling walker (2 wheeled) Gait Pattern/deviations: Step-through pattern;Decreased step length - right;Decreased step length - left;Trunk flexed Gait velocity: decr Gait velocity interpretation: Below normal speed for age/gender General Gait Details: Assist for safety. Pt with two standing rest breaks. When resting stood more erect to lessen pressure on UE's   Stairs            Wheelchair  Mobility    Modified Rankin (Stroke Patients Only)       Balance Overall balance assessment: Needs assistance Sitting-balance support: No upper extremity supported;Feet supported Sitting balance-Leahy Scale: Fair     Standing balance support: Bilateral upper extremity supported Standing balance-Leahy Scale: Poor Standing balance comment: walker and supervision for static standing                    Cognition Arousal/Alertness: Awake/alert Behavior During Therapy: WFL for tasks assessed/performed Overall Cognitive Status: Within Functional Limits for tasks assessed                      Exercises      General Comments        Pertinent Vitals/Pain Pain Assessment: 0-10 Pain Score: 3  Pain Location: RLE Pain Descriptors / Indicators: Aching Pain Intervention(s): Limited activity within patient's tolerance;Monitored during session    Home Living                      Prior Function            PT Goals (current goals can now be found in the care plan section) Progress towards PT goals: Progressing toward goals    Frequency    Min 3X/week      PT Plan Current plan remains appropriate    Co-evaluation             End of Session   Activity Tolerance: Patient limited by fatigue Patient left: in chair;with call bell/phone  within reach Nurse Communication: Mobility status PT Visit Diagnosis: Muscle weakness (generalized) (M62.81);Unsteadiness on feet (R26.81)     Time: 2023-3435 PT Time Calculation (min) (ACUTE ONLY): 24 min  Charges:  $Gait Training: 23-37 mins                    G Codes:       Shary Decamp [place] 12-Oct-2016, 2:54 PM [employer] PT [phone]

## 2016-09-13 NOTE — Clinical Social Work Placement (Signed)
   CLINICAL SOCIAL WORK PLACEMENT  NOTE  Date:  09/13/2016  Patient Details  Name: Kristina Howell MRN: [id number] Date of Birth: 05/13/1936  Clinical Social Work is seeking post-discharge placement for this patient at the [place] level of care (*CSW will initial, date and re-position this form in  chart as items are completed):  Yes   Patient/family provided with [facility] Work Department's list of facilities offering this level of care within the geographic area requested by the patient (or if unable, by the patient's family).  Yes   Patient/family informed of their freedom to choose among providers that offer the needed level of care, that participate in Medicare, Medicaid or managed care program needed by the patient, have an available bed and are willing to accept the patient.  Yes   Patient/family informed of [facility]'s ownership interest in [facility] and [facility], as well as of the fact that they are under no obligation to receive care at these facilities.  PASRR submitted to EDS on 09/10/16     PASRR number received on 09/10/16     Existing PASRR number confirmed on       FL2 transmitted to all facilities in geographic area requested by pt/family on 09/13/16     FL2 transmitted to all facilities within larger geographic area on       Patient informed that his/her managed care company has contracts with or will negotiate with certain facilities, including the following:            Patient/family informed of bed offers received.  Patient chooses bed at       Physician recommends and patient chooses bed at      Patient to be transferred to   on  .  Patient to be transferred to facility by       Patient family notified on   of transfer.  Name of family member notified:        PHYSICIAN Please sign FL2     Additional Comment:    _______________________________________________ Candie Chroman, LCSW 09/13/2016, 9:17  AM

## 2016-09-13 NOTE — Clinical Social Work Note (Signed)
Clinical Social Work Assessment  Patient Details  Name: Kristina Howell MRN: [id number] Date of Birth: 1935-10-08  Date of referral:  09/13/16               Reason for consult:  Facility Placement, Discharge Planning                Permission sought to share information with:  Facility Sport and exercise psychologist, Family Supports Permission granted to share information::  Yes, Verbal Permission Granted  Name::     Kristina Howell  Agency::  SNF's  Relationship::  Daughter  Contact Information:  [phone]  Housing/Transportation Living arrangements for the past 2 months:  Single Family Home Source of Information:  Patient, Medical Team Patient Interpreter Needed:  None Criminal Activity/Legal Involvement Pertinent to Current Situation/Hospitalization:  No - Comment as needed Significant Relationships:  Adult Children, Spouse Lives with:  Spouse Do you feel safe going back to the place where you live?  Yes Need for family participation in patient care:  Yes (Comment)  Care giving concerns:  PT has changed their recommendation from HHPT to SNF since surgery.   Social Worker assessment / plan:  CSW met with patient. No supports at bedside. CSW introduced role and explained that PT recommendations would be discussed. Patient stated that she does not want SNF and prefers to go back home. Patient lives at home with her husband. She has four children and they all live relatively close to her. Her husband is only able to help her some at home. Her husband has had home health in the past but she stated they did not do much. CSW explained that RNCM has told her in the past that her insurance does not have as adequate home health benefits. Patient is now agreeable to considering SNF and has given CSW permission to fax out referral. PT aware. No further concerns. CSW encouraged patient to contact CSW as needed. CSW will continue to follow patient for support and facilitate discharge to SNF, if patient  agreeable, once medically stable.  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 Patient's family supportive and involved in patient's care. Patient appreciated social work intervention.  Patient/Family's Understanding of and Emotional Response to Diagnosis, Current Treatment, and Prognosis:  Patient appears to have a good understanding of the reason for admission. Patient appears pleased with hospital care.  Emotional Assessment Appearance:  Appears stated age Attitude/Demeanor/Rapport:  Lethargic Affect (typically observed):  Appropriate, Calm, Pleasant Orientation:  Oriented to Self, Oriented to Place, Oriented to  Time, Oriented to Situation Alcohol / Substance use:  Never Used Psych involvement (Current and /or in the community):  No (Comment)  Discharge Needs  Concerns to be addressed:  Care Coordination Readmission within the last 30 days:  No Current discharge risk:  Dependent with Mobility Barriers to Discharge:  Ship broker, Continued Medical Work up   Kristina Chroman, LCSW 09/13/2016, 9:14 AM

## 2016-09-13 NOTE — Progress Notes (Signed)
Transfused 1 uPRBC, no adverse reaction noted.patient tolerated transfusion  well.

## 2016-09-13 NOTE — Progress Notes (Signed)
Patient feeling better after blood transfusion. Reports being able to walk around room this evening. Denies dyspnea.

## 2016-09-14 DIAGNOSIS — R262 Difficulty in walking, not elsewhere classified: Secondary | ICD-10-CM

## 2016-09-14 DIAGNOSIS — R0601 Orthopnea: Secondary | ICD-10-CM

## 2016-09-14 LAB — CBC
HCT: 26.3 % — ABNORMAL LOW (ref 36.0–46.0)
Hemoglobin: 8.7 g/dL — ABNORMAL LOW (ref 12.0–15.0)
MCH: 28.8 pg (ref 26.0–34.0)
MCHC: 33.1 g/dL (ref 30.0–36.0)
MCV: 87.1 fL (ref 78.0–100.0)
PLATELETS: 263 10*3/uL (ref 150–400)
RBC: 3.02 MIL/uL — ABNORMAL LOW (ref 3.87–5.11)
RDW: 16 % — AB (ref 11.5–15.5)
WBC: 9.3 10*3/uL (ref 4.0–10.5)

## 2016-09-14 LAB — TYPE AND SCREEN
BLOOD PRODUCT EXPIRATION DATE: 201803142359
ISSUE DATE / TIME: 201802261115
UNIT TYPE AND RH: 6200

## 2016-09-14 LAB — BASIC METABOLIC PANEL
Anion gap: 10 (ref 5–15)
BUN: 46 mg/dL — AB (ref 6–20)
CALCIUM: 8.9 mg/dL (ref 8.9–10.3)
CHLORIDE: 100 mmol/L — AB (ref 101–111)
CO2: 22 mmol/L (ref 22–32)
CREATININE: 2.39 mg/dL — AB (ref 0.44–1.00)
GFR calc Af Amer: 21 mL/min — ABNORMAL LOW (ref >60)
GFR calc non Af Amer: 18 mL/min — ABNORMAL LOW (ref >60)
Glucose, Bld: 165 mg/dL — ABNORMAL HIGH (ref 65–99)
Potassium: 3.7 mmol/L (ref 3.5–5.1)
SODIUM: 132 mmol/L — AB (ref 135–145)

## 2016-09-14 LAB — GLUCOSE, CAPILLARY
GLUCOSE-CAPILLARY: 210 mg/dL — AB (ref 65–99)
GLUCOSE-CAPILLARY: 199 mg/dL — AB (ref 65–99)
GLUCOSE-CAPILLARY: 261 mg/dL — AB (ref 65–99)
GLUCOSE-CAPILLARY: 223 mg/dL — AB (ref 65–99)

## 2016-09-14 MED ORDER — APIXABAN 2.5 MG PO TABS
2.5000 mg | ORAL_TABLET | Freq: Two times a day (BID) | ORAL | Status: DC
  Administered 2016-09-15: 2.5 mg via ORAL
  Filled 2016-09-14: qty 1

## 2016-09-14 MED ORDER — FUROSEMIDE 40 MG PO TABS
40.0000 mg | ORAL_TABLET | Freq: Every day | ORAL | Status: DC
  Administered 2016-09-14 – 2016-09-15 (×2): 40 mg via ORAL
  Filled 2016-09-14 (×2): qty 1

## 2016-09-14 MED ORDER — INSULIN GLARGINE 100 UNIT/ML ~~LOC~~ SOLN
28.0000 [IU] | Freq: Every day | SUBCUTANEOUS | Status: DC
  Administered 2016-09-14: 28 [IU] via SUBCUTANEOUS
  Filled 2016-09-14: qty 0.28

## 2016-09-14 NOTE — Progress Notes (Signed)
Transitions of Care Pharmacy Note  Plan:  Educated on changed to medication regimen, dosing, and common side effects  including - Dose change for Eliquis to 2.5 mg twice a day - Addition of DAPT (aspirin/clopidogrel) - Addition of iron supplement (warned about constipation) - Addition of hydralazine  - Increase of lantus dose to 28 units - Addition of Crestor  Educated patient to use a pillbox at home if possible. States that she has one that used to be her husband's.   Addressed concerns regarding cost of the medication. Informed patient that everything had a generic formulation except Eliquis and she stated that her insurance should cover it well.  --------------------------------------------- Kristina Howell is an 81 y.o. female who presents with a chief complaint of worsening dyspnea from new CHF and CAD. In anticipation of discharge, pharmacy has reviewed this patient's prior to admission medication history, as well as current inpatient medications listed per the [facility].  Current medication indications, dosing, frequency, and notable side effects reviewed with patient. patient verbalized understanding of current inpatient medication regimen and is aware that the After Visit Summary when presented, will represent the most accurate medication list at discharge.   Educated patient about possible side effects from new medications. Advised to consider a stool softener if she notices constipation from the iron supplement.   Kristina Howell expressed concerns regarding having Eliquis 5 mg tablets at home. I told her that I thought that it would be okay to split them but it may be difficult. Recommended a pill splitter.    Assessment: Understanding of regimen: good Understanding of indications: good Potential of compliance: good Barriers to Obtaining Medications: No  Patient instructed to contact inpatient pharmacy team with further questions or concerns if needed.    Time spent preparing  for discharge counseling: 15 minutes  Time spent counseling patient: 20 minutes    Thank you for allowing pharmacy to be a part of this patient's care.  Ihor Austin, PharmD PGY1 Pharmacy Resident Pager: [phone]

## 2016-09-14 NOTE — Progress Notes (Signed)
Physical Therapy Treatment Patient Details Name: Kristina Howell MRN: [id number] DOB: February 17, 1936 Today's Date: 09/14/2016    History of Present Illness 81 y.o. female presenting with worsening dyspnea and admitted with CHF exacerbation. s/p stent with groin hematoma complicating hospital course. PMH is significant for T2DM on insulin, persistent afib, morbid obesity, HTN, gout, and pancreatitis.      PT Comments    Patient making slow improvement with mobility and gait.  Patient declining SNF.  Recommend HHPT for continued therapy at d/c.   Follow Up Recommendations  Home health PT;Supervision for mobility/OOB     Equipment Recommendations  None recommended by PT    Recommendations for Other Services       Precautions / Restrictions Precautions Precautions: Fall Restrictions Weight Bearing Restrictions: No    Mobility  Bed Mobility               General bed mobility comments: Patient in chair - sleeps in recliner  Transfers Overall transfer level: Needs assistance Equipment used: Rolling walker (2 wheeled) Transfers: Sit to/from Stand Sit to Stand: Min guard         General transfer comment: increased time  Ambulation/Gait Ambulation/Gait assistance: Min guard Ambulation Distance (Feet): 100 Feet Assistive device: Rolling walker (2 wheeled) Gait Pattern/deviations: Step-through pattern;Decreased step length - right;Decreased step length - left;Trunk flexed Gait velocity: decreased Gait velocity interpretation: Below normal speed for age/gender General Gait Details: Patient with flexed posture relying heavily on UE's on RW for support.  Cues to try to stand upright.   Stairs            Wheelchair Mobility    Modified Rankin (Stroke Patients Only)       Balance           Standing balance support: Single extremity supported Standing balance-Leahy Scale: Poor                      Cognition Arousal/Alertness:  Awake/alert Behavior During Therapy: WFL for tasks assessed/performed Overall Cognitive Status: Within Functional Limits for tasks assessed                      Exercises Other Exercises Other Exercises: In stance, raising UE's with hands together to shoulder level, 5 reps    General Comments        Pertinent Vitals/Pain Pain Assessment: Faces Faces Pain Scale: Hurts little more Pain Location: Rt LE and hip area Pain Descriptors / Indicators: Aching;Sore Pain Intervention(s): Monitored during session;Repositioned    Home Living                      Prior Function            PT Goals (current goals can now be found in the care plan section) Acute Rehab PT Goals Patient Stated Goal: get home to usual activities Progress towards PT goals: Progressing toward goals    Frequency    Min 3X/week      PT Plan Discharge plan needs to be updated    Co-evaluation             End of Session Equipment Utilized During Treatment: Gait belt Activity Tolerance: Patient limited by fatigue Patient left: in chair;with call bell/phone within reach;with SCD's reapplied Nurse Communication: Mobility status (bandage off of Rt foot) PT Visit Diagnosis: Muscle weakness (generalized) (M62.81);Unsteadiness on feet (R26.81)     Time: 4098-1191 PT Time Calculation (min) (ACUTE ONLY): 27 min  Charges:  $Gait Training: 23-37 mins                    G Codes:       Kristina Howell 09/23/2016, 8:15 PM Kristina Howell. Kristina Howell, [place] Pager [phone]

## 2016-09-14 NOTE — Progress Notes (Signed)
Family Medicine Teaching Service Daily Progress Note Intern Pager: [phone]  Patient name: Kristina Howell Medical record number: [id number] Date of birth: 03/30/1936 Age: 81 y.o. Gender: female  Primary Care Provider: Jeri Modena Consultants: Cardiology  Code Status: full  Pt Overview and Major Events to Date:  08/29/2016: Admit to FPTS 09/03/2016: Left Heart Cath -- Multivessel CAD, including 90% mid LAD stenosis involving small diagonal branch, 70% ostial/proximal ramus lesion, 30% OM stenosis and sequential 70-80% proximal/mid RCA stenoses 09/08/2016: PCI 09/09/16: 1U PRBC for Hgb 7.5 2/26 1u PRBC for Hgb 7.5  Assessment and Plan: Kristina Scottonis a 81 y.o.femalepresenting with worsening dyspnea. PMH is significant for T2DM on insulin, persistent afib, morbid obesity, HTN, gout, and pancreatitis.   CAD s/p cath and PCI, stable - following cardiology recommendations.  - Dual therapy with ASA and Plavix until Hgb stable  - Cardiology plan - patient needs 4 weeks of triple therapy (ASA, Plavix, Eliquis 2.5 mg BID), then discontinue ASA and continue Eliquis and Plavix. - Will plan to discharge patient on ASA and Plavix tomorrow if Hgb is stable. Have patient follow up closely in 7-10 days in our clinic to retest Hgb and start Eliquis. Patient will need repeat labs at that appointment.  Acute Anemia, improved - 2/2 bleed into right groin hematoma. Hgb improved at 9.1 > 8.7 s/p 1u PRBC 2/26.  - holding anticoagulation, plan above - SCDs - AM CBC  CHF, improving:  Volume status improved on exam. S/p 40 mg IV Lasix yesterday.  UOP 300 mll. Daily wts 282lb unchanged from yesterday (Dry weight 270).  Cr improving at 2.39 from 2.43 yesterday. - start 40 mg PO lasix today  AKI, improving - Cr improving 2.52>2.43>2.39. Baseline may be ~ 1.8. Worsened in setting of recent diuresis, CHF, also PCI stent with contrast and episode of acute hypotension - Hold home ACE until SCr  improves - AM BMP  Hyponatremia Na trending down 416>606>301, uncertain etiology, though with CHF exacerbation - AM BMP  T2DM, stable:  Home lantus 20u nightly. CBG 229, 212. 20u novolog used, Lantus 25u given yesterday evening - continue resistant sliding scale - increase lantus 25u QHS >> 28u QHS  HTN, acutely hypotensive with blood loss, now stable - patient with lower diastolic pressures, 601/79 this morning. - hold benazepril for AKI, consider switching to losartan at DC for uric acid lowering properties - cont hydralazine 25 mg TID, metoprolol XL 50 mg - will hold norvasc 10 mg (new this admit while holding ACE) for low diastolic pressures this AM (2/27) - monitor BP  Hypercholesterolemia, stable LDL 180 - Crestor 10 mg QD for poor renal function  R foot ulcer, stable Callused, does not appear infected. - Wound care   Persistent afib, stable Rate controlled - anticoagulation held with hematoma and anemia, following cards recs - cont home toprol 50 mg  Hx of gout, stable  Continue home allopurinol  FEN/GI: Heart healthy/carb modified, SLIV Prophylaxis: On eliquis  Disposition: PT recommends home health SNF  Subjective:  No acute events overnight. Patient endorses feeling better this morning, states her breathing is improved. She states that her weakness improved with blood transfusion yesterday.  Objective: Temp:  [97 F (36.1 C)-98.4 F (36.9 C)] 97.9 F (36.6 C) (02/27 1209) Pulse Rate:  [60-75] 60 (02/27 1209) Resp:  [16-18] 18 (02/27 1209) BP: (126-135)/(40-52) 135/47 (02/27 1209) SpO2:  [98 %-100 %] 98 % (02/27 1209) Weight:  [127.9 kg (282 lb)] 127.9 kg (282 lb) (02/27 3235) Physical  Exam: General: NAD, sitting up in chair, more interactive than previously Cardiovascular: RRR, no m/r/g Respiratory: CTA bilaterally, no W/R/R, decreased breath sounds at the lung bases bilaterally Abdomen:  + Significant  ecchymosis and tender hematoma RLQ extending to  R flank, otherwise abdomen is soft and nontender, nondistended Extremities:2+ pitting edema bilateral feet, SCDs in place, leg edema improved with SCDs Derm: chronic hyperpigmentation venous changes over lower leg   Laboratory:  Recent Labs Lab 09/13/16 0420 09/13/16 1558 09/14/16 0404  WBC 9.4 10.7* 9.3  HGB 7.5* 9.1* 8.7*  HCT 23.0* 27.8* 26.3*  PLT 240 254 263    Recent Labs Lab 09/12/16 0353 09/13/16 0420 09/14/16 0404  NA 132* 133* 132*  K 3.9 3.7 3.7  CL 99* 98* 100*  CO2 23 23 22   BUN 48* 45* 46*  CREATININE 2.52* 2.43* 2.39*  CALCIUM 8.7* 8.9 8.9  GLUCOSE 194* 197* 165*    Imaging/Diagnostic Tests: Cardiac Echo 08/30/2016 - Left ventricle: The cavity size was normal. Wall thickness was increased in a pattern of mild LVH. Severe hypokinesis of the mid anteroseptal wall, apical septal wall, true apex, mid to apical anterior wall. Indeterminant diastolic function (atrial fibrillation). The estimated ejection fraction was 45%. - Aortic valve: There was no stenosis. - Mitral valve: Mildly to moderately calcified annulus. There was trivial regurgitation. - Left atrium: The atrium was mildly to moderately dilated. - Right ventricle: The cavity size was normal. Systolic function was normal. - Pulmonary arteries: No complete TR doppler jet so unable to estimate PA systolic pressure. - Systemic veins: IVC measured 2.3 cm with < 50% respirophasic variation, suggesting RA pressure 15 mmHg.  Impressions: - Normal LV size with mild LV hypertrophy. EF 45% with wall motion abnormalities as described above, in LAD coronary distribution. Normal RV size and systolic function. No significant valvular abnormalities.   Everrett Coombe, MD 09/14/2016, 1:33 PM PGY-1, [place] Intern pager: [phone], text pages welcome

## 2016-09-14 NOTE — Progress Notes (Signed)
Pt is alert and oriented has been sitting in the chair since 0400. Has work with PT and bathed, vitals stable

## 2016-09-14 NOTE — Clinical Social Work Note (Signed)
CSW met with patient. No supports at bedside. CSW provided list of bed offers. Patient stated that at this time she wants to return home rather than going to SNF. Patient ambulated 100 feet yesterday in PT. Patient already has a lift chair and walker at home. RNCM notified.  CSW signing off. Consult again if any other social work needs arise.  Dayton Scrape, [place]

## 2016-09-14 NOTE — Progress Notes (Signed)
Pt. son called for an update, stated he was not on the list and would have to talk to the pt and or daughter that's on the list. He stated he understood and would work together with the sister.

## 2016-09-14 NOTE — Progress Notes (Signed)
Subjective:    Feels better. Discomfort and right hip and groin area is diminishing. He received one unit of blood yesterday.  Prior to admission the patient was taking a total of 5 mg of Eliquis daily because of excessive bruising on 5 mg twice a day. This was okayed by her primary care physician  Objective:   Temp:  [97 F (36.1 C)-98.4 F (36.9 C)] 97 F (36.1 C) (02/27 0559) Pulse Rate:  [65-75] 75 (02/27 1100) Resp:  [16-18] 18 (02/27 0559) BP: (126-135)/(40-52) 130/44 (02/27 1100) SpO2:  [98 %-100 %] 98 % (02/27 0559) Weight:  [282 lb (127.9 kg)] 282 lb (127.9 kg) (02/27 0625) Last BM Date: 09/11/16  Filed Weights   09/12/16 0332 09/13/16 0545 09/14/16 7619  Weight: 281 lb 8 oz (127.7 kg) 281 lb 11.2 oz (127.8 kg) 282 lb (127.9 kg)    Intake/Output Summary (Last 24 hours) at 09/14/16 1205 Last data filed at 09/14/16 1100  Gross per 24 hour  Intake             1163 ml  Output              700 ml  Net              463 ml    TELEMETRY :   afib with controlled rate. - Personally reviewed.  Exam:  General: NAD. Complexion is pinker  HEENT: sclera clear, throat clear  Resp: Clear to auscultation except posterior at bases. Faint rales are heard.  Cardiac: irreg, no m/r/g, no jvd.  GI: Abdomen is soft. Large right flank and hip area hematoma/Ecchymosis, much softer now compared to prior.  MSK: 2+ versus 1+ lower extremity edema right versus left.  Neuro: no focal deficits  Psych: Depressed  Lab Results:  Basic Metabolic Panel:  Recent Labs Lab 09/12/16 0353 09/13/16 0420 09/14/16 0404  NA 132* 133* 132*  K 3.9 3.7 3.7  CL 99* 98* 100*  CO2 23 23 22   GLUCOSE 194* 197* 165*  BUN 48* 45* 46*  CREATININE 2.52* 2.43* 2.39*  CALCIUM 8.7* 8.9 8.9    Liver Function Tests: No results for input(s): AST, ALT, ALKPHOS, BILITOT, PROT, ALBUMIN in the last 168 hours.  CBC:  Recent Labs Lab 09/13/16 0420 09/13/16 1558 09/14/16 0404  WBC 9.4 10.7*  9.3  HGB 7.5* 9.1* 8.7*  HCT 23.0* 27.8* 26.3*  MCV 89.1 87.4 87.1  PLT 240 254 263    Cardiac Enzymes: No results for input(s): CKTOTAL, CKMB, CKMBINDEX, TROPONINI in the last 168 hours.  BNP: No results for input(s): PROBNP in the last 8760 hours.  Coagulation:  Recent Labs Lab 09/07/16 1526  INR 1.04   Coronary Angiography/PCI: 09/08/16: Diagnostic Diagram     Post-Intervention Diagram         ECG:   Medications:   Scheduled Medications: . allopurinol  300 mg Oral Daily  . aspirin  81 mg Oral Daily  . clopidogrel  75 mg Oral Q breakfast  . ferrous sulfate  325 mg Oral BID WC  . heparin  5,000 Units Subcutaneous Q8H  . hydrALAZINE  25 mg Oral Q8H  . insulin aspart  0-20 Units Subcutaneous TID WC  . insulin aspart  0-5 Units Subcutaneous QHS  . insulin glargine  28 Units Subcutaneous QHS  . metoprolol succinate  50 mg Oral Daily  . rosuvastatin  10 mg Oral q1800  . silver sulfADIAZINE   Topical Daily  . sodium chloride  flush  3 mL Intravenous Q12H    Infusions:   PRN Medications: sodium chloride, acetaminophen, HYDROcodone-acetaminophen, ondansetron (ZOFRAN) IV, sodium chloride flush     Assessment/Plan    1. CAD - s/p stents to LAD and RCA on 09/08/16. No ischemic symptoms. Aspirin and Plavix to prevent stent thrombosis.  2. Acute on chronic systolic HF - echo 5/00/93 LVEF 45%, Severe hypokinesis of the mid   anteroseptal wall, apical septal wall, true apex, mid to apical   anterior wall. Hopefully LV function will return to normal with above-noted stenting. Will need chronic diuretic therapy, perhaps    40 mg daily when kidney function stabilizes.   3. AKI on CKD - Creatinine trending downward over the past 48 hours.  Continue to hold diuretic therapy.  4. Afib - As an outpatient, she was taking Eliquis 5 mg daily. When it is resumed tomorrow I would recommend 2.5 mg twice a day. If no recurrence of bleeding 24 hours after the first dose,  she will be eligible for discharge.  5. Groin hematoma - Resume Eliquis tomorrow at 2.5 mg BID. If no evidence of bleeding 24 hours later, she will be eligible for discharge on Thursday morning.  6. Anemia - After one unit of blood yesterday, hemoglobin is 8.7.   Illene Labrador, III, MD [facility]

## 2016-09-14 NOTE — Progress Notes (Signed)
Occupational Therapy Treatment Patient Details Name: Kristina Howell MRN: [id number] DOB: 1935/10/19 Today's Date: 09/14/2016    History of present illness 81 y.o. female presenting with worsening dyspnea and admitted with CHF exacerbation. s/p stent with groin hematoma complicating hospital course. PMH is significant for T2DM on insulin, persistent afib, morbid obesity, HTN, gout, and pancreatitis.     OT comments  Pt performed bathing and dressing seated at sink on 3 in 1. Reinforced energy conservation and use of AE for pericare and LB bathing and dressing. Requires total assist for LB and periarea. Continue to recommend post acute rehab in SNF,but pt likely to refuse.  Follow Up Recommendations  SNF;Supervision/Assistance - 24 hour (HHOT and home health aide if pt refuses)    Equipment Recommendations  None recommended by OT    Recommendations for Other Services      Precautions / Restrictions Precautions Precautions: Fall Restrictions Weight Bearing Restrictions: No RLE Weight Bearing: Non weight bearing       Mobility Bed Mobility               General bed mobility comments: pt in chair  Transfers Overall transfer level: Needs assistance Equipment used: Rolling walker (2 wheeled) Transfers: Sit to/from Stand Sit to Stand: Min guard         General transfer comment: increased time    Balance             Standing balance-Leahy Scale: Poor                     ADL Overall ADL's : Needs assistance/impaired     Grooming: Oral care;Standing;Minimal assistance;Wash/dry face;Wash/dry hands;Sitting;Set up   Upper Body Bathing: Minimal assistance;Sitting Upper Body Bathing Details (indicate cue type and reason): assisted for back Lower Body Bathing: Total assistance;Sit to/from stand Lower Body Bathing Details (indicate cue type and reason): reinforced benefits of long handled bath sponge Upper Body Dressing : Set up;Sitting   Lower Body  Dressing: Minimal assistance;Sitting/lateral leans Lower Body Dressing Details (indicate cue type and reason): donned socks with sock wide sock aid Toilet Transfer: Min guard;Ambulation;BSC;RW (BSC over toilet)   Toileting- Clothing Manipulation and Hygiene: Total assistance;Sit to/from stand Toileting - Clothing Manipulation Details (indicate cue type and reason): educated in use of tongs for pericare     Functional mobility during ADLs: Min guard;Rolling walker General ADL Comments: Pt much improved vs last visit. Sat at sink on [facility] for bathing and dressing.      Vision                     Perception     Praxis      Cognition   Behavior During Therapy: WFL for tasks assessed/performed Overall Cognitive Status: Within Functional Limits for tasks assessed                         Exercises     Shoulder Instructions       General Comments      Pertinent Vitals/ Pain       Pain Assessment: No/denies pain  Home Living                                          Prior Functioning/Environment              Frequency  Min 2X/week  Progress Toward Goals  OT Goals(current goals can now be found in the care plan section)  Progress towards OT goals: Progressing toward goals  Acute Rehab OT Goals Patient Stated Goal: get home to usual activities Time For Goal Achievement: 09/14/16 Potential to Achieve Goals: Good  Plan Discharge plan remains appropriate    Co-evaluation                 End of Session Equipment Utilized During Treatment: Rolling walker  OT Visit Diagnosis: Unsteadiness on feet (R26.81);Muscle weakness (generalized) (M62.81);Other abnormalities of gait and mobility (R26.89)   Activity Tolerance Patient tolerated treatment well   Patient Left in chair;with call bell/phone within reach   Nurse Communication          Time: 3734-2876 OT Time Calculation (min): 40 min  Charges: OT General  Charges $OT Visit: 1 Procedure OT Treatments $Self Care/Home Management : 38-52 mins    Malka So 09/14/2016, 10:01 AM  [phone]

## 2016-09-14 NOTE — Progress Notes (Signed)
Pwr Judson Roch SW, patient is refusing SNF placement and plans to return home with [facility] services provided by [facility] as prior to admission; Aneta Mins [phone]

## 2016-09-15 ENCOUNTER — Inpatient Hospital Stay (HOSPITAL_COMMUNITY): Payer: PPO

## 2016-09-15 DIAGNOSIS — R609 Edema, unspecified: Secondary | ICD-10-CM

## 2016-09-15 DIAGNOSIS — Z955 Presence of coronary angioplasty implant and graft: Secondary | ICD-10-CM

## 2016-09-15 DIAGNOSIS — I97410 Intraoperative hemorrhage and hematoma of a circulatory system organ or structure complicating a cardiac catheterization: Secondary | ICD-10-CM

## 2016-09-15 LAB — GLUCOSE, CAPILLARY
GLUCOSE-CAPILLARY: 216 mg/dL — AB (ref 65–99)
GLUCOSE-CAPILLARY: 261 mg/dL — AB (ref 65–99)
Glucose-Capillary: 133 mg/dL — ABNORMAL HIGH (ref 65–99)
Glucose-Capillary: 229 mg/dL — ABNORMAL HIGH (ref 65–99)

## 2016-09-15 LAB — BASIC METABOLIC PANEL
Anion gap: 8 (ref 5–15)
BUN: 46 mg/dL — AB (ref 6–20)
CO2: 23 mmol/L (ref 22–32)
Calcium: 9.1 mg/dL (ref 8.9–10.3)
Chloride: 103 mmol/L (ref 101–111)
Creatinine, Ser: 2.36 mg/dL — ABNORMAL HIGH (ref 0.44–1.00)
GFR calc Af Amer: 22 mL/min — ABNORMAL LOW (ref >60)
GFR, EST NON AFRICAN AMERICAN: 19 mL/min — AB (ref >60)
GLUCOSE: 137 mg/dL — AB (ref 65–99)
POTASSIUM: 3.8 mmol/L (ref 3.5–5.1)
Sodium: 134 mmol/L — ABNORMAL LOW (ref 135–145)

## 2016-09-15 LAB — CBC
HEMATOCRIT: 27 % — AB (ref 36.0–46.0)
Hemoglobin: 8.8 g/dL — ABNORMAL LOW (ref 12.0–15.0)
MCH: 28.5 pg (ref 26.0–34.0)
MCHC: 32.6 g/dL (ref 30.0–36.0)
MCV: 87.4 fL (ref 78.0–100.0)
PLATELETS: 289 10*3/uL (ref 150–400)
RBC: 3.09 MIL/uL — AB (ref 3.87–5.11)
RDW: 15.7 % — ABNORMAL HIGH (ref 11.5–15.5)
WBC: 9.9 10*3/uL (ref 4.0–10.5)

## 2016-09-15 MED ORDER — FUROSEMIDE 80 MG PO TABS
80.0000 mg | ORAL_TABLET | Freq: Every day | ORAL | Status: DC
  Administered 2016-09-16: 80 mg via ORAL
  Filled 2016-09-15: qty 1

## 2016-09-15 MED ORDER — FUROSEMIDE 40 MG PO TABS
40.0000 mg | ORAL_TABLET | Freq: Once | ORAL | Status: AC
  Administered 2016-09-15: 40 mg via ORAL
  Filled 2016-09-15: qty 1

## 2016-09-15 MED ORDER — APIXABAN 2.5 MG PO TABS
2.5000 mg | ORAL_TABLET | Freq: Two times a day (BID) | ORAL | Status: DC
  Administered 2016-09-15 – 2016-09-16 (×2): 2.5 mg via ORAL
  Filled 2016-09-15 (×2): qty 1

## 2016-09-15 MED ORDER — INSULIN GLARGINE 100 UNIT/ML ~~LOC~~ SOLN
30.0000 [IU] | Freq: Every day | SUBCUTANEOUS | Status: DC
  Administered 2016-09-15: 30 [IU] via SUBCUTANEOUS
  Filled 2016-09-15 (×2): qty 0.3

## 2016-09-15 NOTE — Progress Notes (Signed)
CARDIAC REHAB PHASE I   PRE:  Rate/Rhythm: 64 a fib  BP:  Sitting: 143/46        SaO2: 99 RA  MODE:  Ambulation: 84 ft   POST:  Rate/Rhythm: 88 a fib  BP:  Sitting: 159/47         SaO2: 100 RA  Pt ambulated 84 ft on RA, rolling walker, gait belt, assist x2, steady gait, tolerated fairly well.  Pt c/o mild DOE, increased fatigue with distance/gernalized weakness, however much improved. Completed PCI/stent/CHF education.  Reviewed risk factors, anti-platelet therapy, stent card, activity restrictions, ntg, exercise, heart healthy diet, carb counting, sodium restrictions, CHF booklet and zone tool, daily weights and phase 2 cardiac rehab. Pt verbalized understanding, receptive to education. Pt agrees to phase 2 cardiac rehab referral, will send to [place] per pt request. Pt to recliner after walk, feet elevated, call bell within reach. Will follow up tomorrow if pt does not discharge today.     [id number] Lenna Sciara, RN, BSN 09/15/2016 9:01 AM

## 2016-09-15 NOTE — Progress Notes (Signed)
Physical Therapy Treatment Patient Details Name: Kristina Howell MRN: [id number] DOB: [birth year] Today's Date: 09/15/2016    History of Present Illness 81 y.o. female presenting with worsening dyspnea and admitted with CHF exacerbation. s/p stent with groin hematoma complicating hospital course. PMH is significant for T2DM on insulin, persistent afib, morbid obesity, HTN, gout, and pancreatitis.      PT Comments    Pt is up to walk from chair, has SOB and some need to stop and rest briefly but is motivated to keep going as she can.  Will be expecting to go home and will require HHPT for strengthenign and balance, and for monitoring of vitals to maintain safety and advance activity as is reasonable.  Continue acutely for same reasons.   Follow Up Recommendations  Home health PT;Supervision for mobility/OOB     Equipment Recommendations  None recommended by PT    Recommendations for Other Services       Precautions / Restrictions Precautions Precautions: Fall Restrictions Weight Bearing Restrictions: No RLE Weight Bearing: Non weight bearing    Mobility  Bed Mobility Overal bed mobility: Modified Independent             General bed mobility comments: in the chair  Transfers Overall transfer level: Needs assistance Equipment used: Rolling walker (2 wheeled) Transfers: Sit to/from [facility] Sit to Stand: Min guard Stand pivot transfers: Min guard          Ambulation/Gait Ambulation/Gait assistance: Min guard Ambulation Distance (Feet): 140 Feet Assistive device: Rolling walker (2 wheeled);1 person hand held assist Gait Pattern/deviations: Step-through pattern;Decreased stride length;Wide base of support;Trunk flexed;Shuffle;Decreased weight shift to right Gait velocity: decreased Gait velocity interpretation: Below normal speed for age/gender General Gait Details: using walker to steady herself with some mild SOB   Stairs             Wheelchair Mobility    Modified Rankin (Stroke Patients Only)       Balance Overall balance assessment: Needs assistance Sitting-balance support: Feet supported Sitting balance-Leahy Scale: Good   Postural control: Posterior lean Standing balance support: Bilateral upper extremity supported Standing balance-Leahy Scale: Fair Standing balance comment: used walker for standing control and could wait for PT to place commode seat on toilet                    Cognition Arousal/Alertness: Awake/alert Behavior During Therapy: WFL for tasks assessed/performed Overall Cognitive Status: Within Functional Limits for tasks assessed                      Exercises      General Comments        Pertinent Vitals/Pain Pain Assessment: No/denies pain    Home Living                      Prior Function            PT Goals (current goals can now be found in the care plan section) Acute Rehab PT Goals Patient Stated Goal: get home Progress towards PT goals: Progressing toward goals    Frequency    Min 3X/week      PT Plan Current plan remains appropriate    Co-evaluation             End of Session Equipment Utilized During Treatment: Gait belt Activity Tolerance: Patient limited by fatigue Patient left: in chair;with call bell/phone within reach;with chair alarm set Nurse Communication: Mobility  status PT Visit Diagnosis: Muscle weakness (generalized) (M62.81);Other abnormalities of gait and mobility (R26.89)     Time: 8548-8301 PT Time Calculation (min) (ACUTE ONLY): 24 min  Charges:  $Gait Training: 8-22 mins $Therapeutic Activity: 8-22 mins                    G Codes:       Ramond Dial 10-11-16, 12:41 PM   Mee Hives, PT MS Acute Rehab Dept. Number: [place] and [place]

## 2016-09-15 NOTE — Progress Notes (Signed)
Family Medicine Teaching Service Daily Progress Note Intern Pager: [phone]  Patient name: Kristina Howell Medical record number: [id number] Date of birth: [birth year] Age: 81 y.o. Gender: female  Primary Care Provider: Jeri Modena Consultants: Cardiology  Code Status: full  Pt Overview and Major Events to Date:  08/29/2016: Admit to FPTS 09/03/2016: Left Heart Cath -- Multivessel CAD, including 90% mid LAD stenosis involving small diagonal branch, 70% ostial/proximal ramus lesion, 30% OM stenosis and sequential 70-80% proximal/mid RCA stenoses 09/08/2016: PCI 09/09/16: 1U PRBC for Hgb 7.5 2/26 1u PRBC for Hgb 7.5  Assessment and Plan: Jaskirat Scottonis a 81 y.o.femalepresenting with worsening dyspnea. PMH is significant for T2DM on insulin, persistent afib, morbid obesity, HTN, gout, and pancreatitis.   CAD s/p cath and PCI, stable - consider discharging patient today with close follow-up to start Eliquis however after considering risk/benefits agree would be in the patient's best interest if we start Eliquis here in the hospital and monitor her hemoglobin. She already received her first dose of this medication this morning. If hemoglobin is stable tomorrow we can consider discharge tomorrow afternoon. - continue ASA and Plavix - Started Eliquis 2.5 mg BID 2/28 - Patient needs 4 weeks of triple therapy (ASA, Plavix, Eliquis 2.5 mg BID through 3/28), then discontinue ASA and continue Eliquis and Plavix thereafter - Appreciate cardiology recommendations  Acute Anemia, stable - 2/2 bleed into right groin hematoma. Hgb improved at 9.1 > 8.7 >8.8 s/p 1u PRBC 2/26.  - SCDs  CHF, improving:  Volume status improved on exam. Transitioned to PO Lasix 2/27.  UOP 300 ml. Daily wts 282lb (Dry weight 270).  Cr improving at 2.39 from 2.43 on 2/27. - inc to 80 mg Lasix PO. Patient already received 40 mg this AM so will repeat this dose and start inc 3/1  AKI, improving - Cr continues to  improve 2.52>>>2.36. Baseline may be ~ 1.8. Worsened in setting of recent diuresis, CHF, also PCI stent with contrast and episode of acute hypotension - Hold home ACE until SCr improves - AM BMP  Hyponatremia,stable  Na improved at 134,, has been 132-135 over last few days. uncertain etiology, though with CHF exacerbation - AM BMP  T2DM, stable:  Home lantus 20u nightly. CBG 229, 212. 20u novolog used, Lantus 25u given yesterday evening - continue resistant sliding scale - increase lantus 28u QHS >> 30u QHS  HTN, acutely hypotensive with blood loss, now stable - patient with lower diastolic pressures, 284/13 this morning. - hold benazepril for AKI, consider switching to losartan at DC for uric acid lowering properties - cont hydralazine 25 mg TID, metoprolol XL 50 mg - will hold norvasc 10 mg (new this admit while holding ACE) for low diastolic pressures this AM (2/27) - monitor BP  Hypercholesterolemia, stable LDL 180 - Crestor 10 mg QD (dec dose for poor renal function) - once CrCl > 30 can increase back to 20 mg QD  R foot ulcer, stable Callused, does not appear infected. - Wound care   Persistent afib, stable Rate controlled - anticoagulation held with hematoma and anemia, following cards recs - cont home toprol 50 mg  Hx of gout, stable  Continue home allopurinol  FEN/GI: Heart healthy/carb modified, SLIV Prophylaxis: SCDs  Disposition: PT recommends home health SNF, Pt wants go go home w HH  Subjective:  Patient did well overnight with no acute events. She feels stronger today and has been able to ambulate to the bathroom.  Objective: Temp:  [97.8 F (36.6 C)-98.5  F (36.9 C)] 97.8 F (36.6 C) (02/28 1232) Pulse Rate:  [70-78] 78 (02/28 1232) Resp:  [18] 18 (02/28 1232) BP: (133-147)/(50-68) 133/50 (02/28 1232) SpO2:  [98 %-100 %] 100 % (02/28 1232) Weight:  [128.3 kg (282 lb 12.8 oz)] 128.3 kg (282 lb 12.8 oz) (02/28 0445) Physical Exam: General: NAD,  sitting up in chair, more interactive than previously Cardiovascular: RRR, no m/r/g Respiratory: CTA bilaterally, no W/R/R, decreased breath sounds at the lung bases bilaterally Abdomen:  + Significant  ecchymosis and tender hematoma RLQ extending to R flank, otherwise abdomen is soft and nontender, nondistended Extremities:2+ pitting edema bilateral feet, SCDs in place, leg edema improved with SCDs Derm: chronic hyperpigmentation venous changes over lower leg   Laboratory:  Recent Labs Lab 09/13/16 1558 09/14/16 0404 09/15/16 0212  WBC 10.7* 9.3 9.9  HGB 9.1* 8.7* 8.8*  HCT 27.8* 26.3* 27.0*  PLT 254 263 289    Recent Labs Lab 09/13/16 0420 09/14/16 0404 09/15/16 0212  NA 133* 132* 134*  K 3.7 3.7 3.8  CL 98* 100* 103  CO2 23 22 23   BUN 45* 46* 46*  CREATININE 2.43* 2.39* 2.36*  CALCIUM 8.9 8.9 9.1  GLUCOSE 197* 165* 137*    Imaging/Diagnostic Tests: Cardiac Echo 08/30/2016 - Left ventricle: The cavity size was normal. Wall thickness was increased in a pattern of mild LVH. Severe hypokinesis of the mid anteroseptal wall, apical septal wall, true apex, mid to apical anterior wall. Indeterminant diastolic function (atrial fibrillation). The estimated ejection fraction was 45%. - Aortic valve: There was no stenosis. - Mitral valve: Mildly to moderately calcified annulus. There was trivial regurgitation. - Left atrium: The atrium was mildly to moderately dilated. - Right ventricle: The cavity size was normal. Systolic function was normal. - Pulmonary arteries: No complete TR doppler jet so unable to estimate PA systolic pressure. - Systemic veins: IVC measured 2.3 cm with < 50% respirophasic variation, suggesting RA pressure 15 mmHg.  Impressions: - Normal LV size with mild LV hypertrophy. EF 45% with wall motion abnormalities as described above, in LAD coronary distribution. Normal RV size and systolic function. No significant  valvular abnormalities.   Everrett Coombe, MD 09/15/2016, 2:30 PM PGY-1, [place] Intern pager: [phone], text pages welcome

## 2016-09-15 NOTE — Progress Notes (Addendum)
Subjective:    Feels better.Has ambulated with rehabilitation this morning. Oxygen saturations remained at 100%. Tolerated and 84 foot walk relatively well.  Objective:   Temp:  [97.8 F (36.6 C)-98.5 F (36.9 C)] 98.2 F (36.8 C) (02/28 0754) Pulse Rate:  [60-77] 72 (02/28 0754) Resp:  [18] 18 (02/28 0445) BP: (130-147)/(44-68) 147/68 (02/28 0754) SpO2:  [98 %-100 %] 100 % (02/28 0754) Weight:  [282 lb 12.8 oz (128.3 kg)] 282 lb 12.8 oz (128.3 kg) (02/28 0445) Last BM Date: 09/11/16  Filed Weights   09/13/16 0545 09/14/16 0625 09/15/16 0445  Weight: 281 lb 11.2 oz (127.8 kg) 282 lb (127.9 kg) 282 lb 12.8 oz (128.3 kg)    Intake/Output Summary (Last 24 hours) at 09/15/16 1040 Last data filed at 09/15/16 0942  Gross per 24 hour  Intake              566 ml  Output             1800 ml  Net            -1234 ml    TELEMETRY :   afib with controlled rate. - Personally reviewed.  Exam:  General: NAD. Complexion is pinker  HEENT: sclera clear, throat clear  Resp: Clear to auscultation except posterior at bases. Faint rales are heard.  Cardiac: Irregularly irregular rhythm. No JVD is noted.  GI: Abdomen is soft. Large right lower flank/pelvic/hip ecchymosis and hematoma are much softer.   MSK: 2+ versus 1+ lower extremity edema right versus left.  Neuro: no focal deficits  Psych: Depressed  Lab Results:  Basic Metabolic Panel:  Recent Labs Lab 09/13/16 0420 09/14/16 0404 09/15/16 0212  NA 133* 132* 134*  K 3.7 3.7 3.8  CL 98* 100* 103  CO2 23 22 23   GLUCOSE 197* 165* 137*  BUN 45* 46* 46*  CREATININE 2.43* 2.39* 2.36*  CALCIUM 8.9 8.9 9.1    Liver Function Tests: No results for input(s): AST, ALT, ALKPHOS, BILITOT, PROT, ALBUMIN in the last 168 hours.  CBC:  Recent Labs Lab 09/13/16 1558 09/14/16 0404 09/15/16 0212  WBC 10.7* 9.3 9.9  HGB 9.1* 8.7* 8.8*  HCT 27.8* 26.3* 27.0*  MCV 87.4 87.1 87.4  PLT 254 263 289    Cardiac  Enzymes: No results for input(s): CKTOTAL, CKMB, CKMBINDEX, TROPONINI in the last 168 hours.  BNP: No results for input(s): PROBNP in the last 8760 hours.  Coagulation: No results for input(s): INR in the last 168 hours. Coronary Angiography/PCI: 09/08/16: Diagnostic Diagram     Post-Intervention Diagram         ECG:   Medications:   Scheduled Medications: . allopurinol  300 mg Oral Daily  . apixaban  2.5 mg Oral BID  . aspirin  81 mg Oral Daily  . clopidogrel  75 mg Oral Q breakfast  . ferrous sulfate  325 mg Oral BID WC  . furosemide  40 mg Oral Daily  . hydrALAZINE  25 mg Oral Q8H  . insulin aspart  0-20 Units Subcutaneous TID WC  . insulin aspart  0-5 Units Subcutaneous QHS  . insulin glargine  30 Units Subcutaneous QHS  . metoprolol succinate  50 mg Oral Daily  . rosuvastatin  10 mg Oral q1800  . silver sulfADIAZINE   Topical Daily  . sodium chloride flush  3 mL Intravenous Q12H    Infusions:   PRN Medications: sodium chloride, acetaminophen, ondansetron (ZOFRAN) IV, sodium chloride flush  Assessment/Plan    1. CAD - s/p stents to LAD and RCA on 09/08/16. No ischemic symptoms. Aspirin and Plavix to prevent stent thrombosis.  2. Acute on chronic systolic HF - No obvious evidence of volume overload.  3. AKI on CKD - Creatinine continues to trend downward. Would continue to hold diuretic therapy.   4. Afib - With good rate control. Eliquis 2.5 mg started today.  5. Groin hematoma - Resume Eliquis at 2.5 mg BID today. If no evidence of bleeding 24 hours later, she will be eligible for discharge on Thursday morning. - Follow-up right groin duplex scan today to rule out pseudoaneurysm.  6. Anemia - Hemoglobin is now stable currently at 8.8. Recheck hemoglobin in a.m.   Overall plan is for discharge tomorrow assuming stable hemoglobin after resuming Eliquis. Still holding diuretic therapy. Need to determine what outpatient dose to use based upon  laboratory data at discharge.   Illene Labrador, III, MD [facility]

## 2016-09-15 NOTE — Progress Notes (Signed)
Vascular ultrasound preliminary findings:  No evidence of pseudoaneurysm of the right groin. Compared to previous studies. Large hematoma is still visible right lateral hip, but with brighter echoes which is consistent with aging.   Landry Mellow, RDMS, RVT 09/15/2016

## 2016-09-16 DIAGNOSIS — I97410 Intraoperative hemorrhage and hematoma of a circulatory system organ or structure complicating a cardiac catheterization: Secondary | ICD-10-CM

## 2016-09-16 LAB — CBC
HCT: 28.1 % — ABNORMAL LOW (ref 36.0–46.0)
HEMOGLOBIN: 9 g/dL — AB (ref 12.0–15.0)
MCH: 28.2 pg (ref 26.0–34.0)
MCHC: 32 g/dL (ref 30.0–36.0)
MCV: 88.1 fL (ref 78.0–100.0)
Platelets: 331 10*3/uL (ref 150–400)
RBC: 3.19 MIL/uL — ABNORMAL LOW (ref 3.87–5.11)
RDW: 15.7 % — ABNORMAL HIGH (ref 11.5–15.5)
WBC: 10.4 10*3/uL (ref 4.0–10.5)

## 2016-09-16 LAB — BASIC METABOLIC PANEL
ANION GAP: 9 (ref 5–15)
BUN: 47 mg/dL — ABNORMAL HIGH (ref 6–20)
CHLORIDE: 101 mmol/L (ref 101–111)
CO2: 23 mmol/L (ref 22–32)
Calcium: 8.9 mg/dL (ref 8.9–10.3)
Creatinine, Ser: 2.28 mg/dL — ABNORMAL HIGH (ref 0.44–1.00)
GFR, EST AFRICAN AMERICAN: 22 mL/min — AB (ref >60)
GFR, EST NON AFRICAN AMERICAN: 19 mL/min — AB (ref >60)
Glucose, Bld: 239 mg/dL — ABNORMAL HIGH (ref 65–99)
POTASSIUM: 4 mmol/L (ref 3.5–5.1)
SODIUM: 133 mmol/L — AB (ref 135–145)

## 2016-09-16 LAB — GLUCOSE, CAPILLARY
GLUCOSE-CAPILLARY: 230 mg/dL — AB (ref 65–99)
Glucose-Capillary: 237 mg/dL — ABNORMAL HIGH (ref 65–99)

## 2016-09-16 MED ORDER — FUROSEMIDE 80 MG PO TABS: 80.0000 mg | ORAL_TABLET | Freq: Every day | ORAL | 0 refills | Status: DC

## 2016-09-16 MED ORDER — ASPIRIN 81 MG PO CHEW: 81.0000 mg | CHEWABLE_TABLET | Freq: Every day | ORAL | 0 refills | Status: DC

## 2016-09-16 MED ORDER — FERROUS SULFATE 325 (65 FE) MG PO TABS: 325.0000 mg | ORAL_TABLET | Freq: Two times a day (BID) | ORAL | 3 refills | Status: DC

## 2016-09-16 MED ORDER — APIXABAN 2.5 MG PO TABS: 2.5000 mg | ORAL_TABLET | Freq: Two times a day (BID) | ORAL | 0 refills | Status: DC

## 2016-09-16 MED ORDER — CLOPIDOGREL BISULFATE 75 MG PO TABS: 75.0000 mg | ORAL_TABLET | Freq: Every day | ORAL | 0 refills | Status: DC

## 2016-09-16 MED ORDER — ROSUVASTATIN CALCIUM 10 MG PO TABS: 20.0000 mg | ORAL_TABLET | Freq: Every day | ORAL | 0 refills | Status: DC

## 2016-09-16 NOTE — Progress Notes (Signed)
Occupational Therapy Treatment Patient Details Name: Kristina Howell MRN: [id number] DOB: 1936/04/23 Today's Date: 09/16/2016    History of present illness 81 y.o. female presenting with worsening dyspnea and admitted with CHF exacerbation. s/p stent with groin hematoma complicating hospital course. PMH is significant for T2DM on insulin, persistent afib, morbid obesity, HTN, gout, and pancreatitis.     OT comments  Pt states she is going home this day.  Pt will benefit from [facility] OT. Pt agrees  Follow Up Recommendations  Home health OT;Supervision/Assistance - 24 hour          Precautions / Restrictions Precautions Precautions: Fall Restrictions Weight Bearing Restrictions: No       Mobility Bed Mobility            pt in chair      Transfers    S with VC for hand placement from recliner as well as toilet                      ADL Overall ADL's : Needs assistance/impaired     Grooming: Oral care;Standing;Wash/dry face;Wash/dry hands;Set up;Brushing hair                   Toilet Transfer: Supervision/safety;RW;Comfort height toilet;Cueing for safety;Cueing for sequencing   Toileting- Clothing Manipulation and Hygiene: Supervision/safety;Sit to/from stand;Cueing for safety;Cueing for sequencing       Functional mobility during ADLs: Min guard;Rolling walker                          Pertinent Vitals/ Pain       Pain Assessment: No/denies pain     Prior Functioning/Environment              Frequency  Min 2X/week        Progress Toward Goals  OT Goals(current goals can now be found in the care plan section)     Acute Rehab OT Goals Patient Stated Goal: get home to usual activities Time For Goal Achievement: 09/14/16 Potential to Achieve Goals: Good  Plan Discharge plan needs to be updated    Co-evaluation                 End of Session Equipment Utilized During Treatment: Rolling walker  OT Visit Diagnosis:  Unsteadiness on feet (R26.81)   Activity Tolerance Patient tolerated treatment well   Patient Left in chair   Nurse Communication Mobility status        Time: 1130-1150 OT Time Calculation (min): 20 min  Charges: OT General Charges $OT Visit: 1 Procedure OT Treatments $Self Care/Home Management : 8-22 mins  [place], [place]   Payton Mccallum D 09/16/2016, 12:07 PM

## 2016-09-16 NOTE — Progress Notes (Signed)
Pt refused to ambulate this time, pt wants to ambulate in the morning and check her oxygen sat.

## 2016-09-16 NOTE — Progress Notes (Signed)
Progress Note  Patient Name: Kristina Howell Date of Encounter: 09/16/2016  Primary Cardiologist: [facility].  Would like to follow with Dr. Domenic Polite in [place]- her husband's cardiologist-- first appt will be in [place] then follow ups in [place].    Subjective   No chest pain or SOB, occ twinge in Rt hip.  Walked in hallway twice last night and this AM without problems.    Inpatient Medications    Scheduled Meds: . allopurinol  300 mg Oral Daily  . apixaban  2.5 mg Oral BID  . aspirin  81 mg Oral Daily  . clopidogrel  75 mg Oral Q breakfast  . ferrous sulfate  325 mg Oral BID WC  . furosemide  80 mg Oral Daily  . hydrALAZINE  25 mg Oral Q8H  . insulin aspart  0-20 Units Subcutaneous TID WC  . insulin aspart  0-5 Units Subcutaneous QHS  . insulin glargine  30 Units Subcutaneous QHS  . metoprolol succinate  50 mg Oral Daily  . rosuvastatin  10 mg Oral q1800  . silver sulfADIAZINE   Topical Daily  . sodium chloride flush  3 mL Intravenous Q12H   Continuous Infusions:  PRN Meds: sodium chloride, acetaminophen, ondansetron (ZOFRAN) IV, sodium chloride flush   Vital Signs    Vitals:   09/15/16 1232 09/15/16 2058 09/16/16 0508 09/16/16 0527  BP: (!) 133/50 (!) 142/58 (!) 132/52   Pulse: 78 72 65   Resp: 18 18 16    Temp: 97.8 F (36.6 C) 97.6 F (36.4 C) 98 F (36.7 C)   TempSrc: Oral Oral Oral   SpO2: 100% 100% 99% 100%  Weight:   284 lb 9.6 oz (129.1 kg)   Height:        Intake/Output Summary (Last 24 hours) at 09/16/16 0959 Last data filed at 09/16/16 4332  Gross per 24 hour  Intake              820 ml  Output              700 ml  Net              120 ml   Filed Weights   09/14/16 0625 09/15/16 0445 09/16/16 0508  Weight: 282 lb (127.9 kg) 282 lb 12.8 oz (128.3 kg) 284 lb 9.6 oz (129.1 kg)    Telemetry    A fib rate controlled - Personally Reviewed  ECG    No new since the 22nd - Personally Reviewed  Physical Exam   GEN: No acute distress.   Neck: No  JVD Cardiac: irreg irreg, no murmurs, rubs, or gallops.  Respiratory: Clear to auscultation bilaterally. GI: obese, Soft, nontender, non-distended  MS: 1-2+ edema of lower ext from knees down and feet; No deformity. Rt groin cath site with ecchymosis stable.  Neuro:  Nonfocal  Psych: Normal affect   Labs    Chemistry Recent Labs Lab 09/14/16 0404 09/15/16 0212 09/16/16 0330  NA 132* 134* 133*  K 3.7 3.8 4.0  CL 100* 103 101  CO2 22 23 23   GLUCOSE 165* 137* 239*  BUN 46* 46* 47*  CREATININE 2.39* 2.36* 2.28*  CALCIUM 8.9 9.1 8.9  GFRNONAA 18* 19* 19*  GFRAA 21* 22* 22*  ANIONGAP 10 8 9      Hematology Recent Labs Lab 09/14/16 0404 09/15/16 0212 09/16/16 0330  WBC 9.3 9.9 10.4  RBC 3.02* 3.09* 3.19*  HGB 8.7* 8.8* 9.0*  HCT 26.3* 27.0* 28.1*  MCV 87.1  87.4 88.1  MCH 28.8 28.5 28.2  MCHC 33.1 32.6 32.0  RDW 16.0* 15.7* 15.7*  PLT 263 289 331    Cardiac EnzymesNo results for input(s): TROPONINI in the last 168 hours. No results for input(s): TROPIPOC in the last 168 hours.   BNPNo results for input(s): BNP, PROBNP in the last 168 hours.   DDimer No results for input(s): DDIMER in the last 168 hours.   Radiology    No results found.  Cardiac Studies  Rt groin arterial ultrasound 09/14/16 Study information:  Study status:  Routine.  Procedure:  A vascular evaluation was performed with the patient in the supine position. The right common femoral arteries were studied. Image quality was excellent. Limited lower extremity arterial duplex study.     Right lateral evaluation with ankle-brachial index and pulse volume recording. Birthdate:  Patient birthdate: August 19, 1935.  Age:  Patient is 81 yr old.  Sex:  Gender: female.  Study date:  Study date: 09/15/2016. Study time: 02:01 PM.  Location:  Vascular laboratory.  Patient status:  Inpatient.  ------------------------------------------------------------------- Summary: No evidence of pseudoaneurysm of the  right groin. Right lateral hip hematoma is still visualized, but with brighter echoes, consistent with aging.  Procedures   Left Heart Cath and Coronary Angiography  09/03/16  Conclusion   Conclusions: 1. Multivessel CAD, including 90% mid LAD stenosis involving small diagonal branch, 70% ostial/proximal ramus lesion, 30% OM stenosis and sequential 70-80% proximal/mid RCA stenoses. 2. Mildly elevated left ventricular filling pressure. 3. Mild to moderate aortic valve gradient, which is suboptimally evaluated due to heart rate variability related to atrial fibrillation. 4. Right radial artery stenosis, which is likely a combination of fixed disease and vasospasm. Stenosis was successfully navigated with angled micropuncture and Versacore wires.  Recommendations: 1. Medical optimization, including further diuresis and close monitoring of renal function. 2. Plan for stage PCI to mid LAD and proximal/mid RCA next week when volume status has improved and renal function has improved/stabilized. 3. Restart heparin infusion 4 hours after TR band removal.   Procedures PCI 09/08/16   Coronary Stent Intervention  Conclusion    CHIP due to renal insufficiency, long diffuse disease in LAD and right coronary, in an elderly diabetic patient felt to be a poor candidate for CABG. Greatest risk is acute kidney injury and access site bleeding. Contrast during the procedure 160 cc. Small hematoma present above cath site at completion of procedure.  Successful LAD provisional stenting with reduction in 90% stenosis to 0% using a 28 x 2.75 Synergy DES postdilated to 3.0 mm in diameter. TIMI grade 2 flow was noted in the diagonal post procedure as was a case prior to the procedure.  Successful proximal to mid RCA stenting with overlapping 3.5 Promus Premier 24 and 8 mm stents. Stents were postdilated to 4.0 mm reducing stenosis to 0% with TIMI grade 3 flow.  RECOMMENDATIONS:   Sheath removal later  today. Care to avoid bleeding. Discussed with floor nurse.  IV hydration with IV Lasix as needed to prevent pulmonary congestion. Will give saline at 125 cc per hour for 10 hours.  Subcutaneous heparin for DVT prophylaxis but not until a.m. at which time NOAC can be resumed if no bleeding. Will need triple drug therapy with aspirin, Plavix, and NOAC x 4 weeks then drop aspirin.  Should not be discharged until 48 hours postprocedure to monitor kidney function.   ECHO 08/30/16 ------------------------------------------------------------------- Study Conclusions  - Left ventricle: The cavity size was normal. Wall thickness was  increased in a pattern of mild LVH. Severe hypokinesis of the mid   anteroseptal wall, apical septal wall, true apex, mid to apical   anterior wall. Indeterminant diastolic function (atrial   fibrillation). The estimated ejection fraction was 45%. - Aortic valve: There was no stenosis. - Mitral valve: Mildly to moderately calcified annulus. There was   trivial regurgitation. - Left atrium: The atrium was mildly to moderately dilated. - Right ventricle: The cavity size was normal. Systolic function   was normal. - Pulmonary arteries: No complete TR doppler jet so unable to   estimate PA systolic pressure. - Systemic veins: IVC measured 2.3 cm with < 50% respirophasic   variation, suggesting RA pressure 15 mmHg.  Impressions:  - Normal LV size with mild LV hypertrophy. EF 45% with wall motion   abnormalities as described above, in LAD coronary distribution.   Normal RV size and systolic function. No significant valvular   abnormalities.   Patient Profile     81 y.o. female w/ hx Type 2 DM on insulin, persistent afib since 2015, morbid obesity, HTN, gout, CKD IV, and pancreatitis was admitted 08/29/2016 w/ CHF, CP. Cath w/ multi-vessel dz, s/p staged DES LAD & RCA 02/21, groin bleed after procedure, VVS saw, Fem-Stop placed.   Assessment & Plan    1.  CAD - s/p stents to LAD and RCA on 09/08/16. No ischemic symptoms. Aspirin and Plavix to prevent stent thrombosis.  2. Acute on chronic systolic HF - echo 7/37/10 LVEF 45%, Severe hypokinesis of the mid anteroseptal wall, apical septal wall, true apex, mid to apical anterior wall. Hopefully LV function will return to normal with above-noted stenting. Will need chronic diuretic therapy, perhaps 40 mg daily when kidney function stabilizes. - wt climbing from 281 to 284 today and + 446 ml.    3. AKI on CKD - Creatinine trending downward over the past 48 hours.  ?Continue to hold diuretic therapy. Does have increasing edema.   Cr. Today 2.28.   4. Afib - As an outpatient, she was taking Eliquis 5 mg daily. Now resumed and we recommend 2.5 mg twice a day. If no recurrence of bleeding 24 hours after the first dose, and no recurrence and Hgb is stable at 9.0. she is eligible for discharge.  5. Groin hematoma - Resumed Eliquis yesterday at 2.5 mg BID. No evidence of bleeding now 24 hours later, she will be eligible for discharge on Thursday morning, no pseudoaneurysm. On ultrasound.    6. Anemia - After one unit of blood 09/13/16, hemoglobin is now 9.0.   Signed, Cecilie Kicks, NP  09/16/2016, 9:59 AM    The patient has been seen in conjunction with Cecilie Kicks, NP-C. All aspects of care have been considered and discussed. The patient has been personally interviewed, examined, and all clinical data has been reviewed.   Still with mild soreness in the right lower flank inguinal femoral and hip region.  Hematoma this significantly softer.  Hemoglobin is now stable for greater than 24 hours despite the addition of Eliquis 2.5 mg by mouth twice a day.  She is eligible for discharge.  Needs early follow-up hemoglobin and a sig metabolic panel.  Aspirin/Plavix/Eliquis for total of one month post PCI then drop aspirin.  Discussed resumption of diuretic therapy with primary care. I  recommend 80 mg of furosemide daily. We need to be careful not to reinjure kidneys.

## 2016-09-16 NOTE — Progress Notes (Signed)
Transitions of Care Pharmacy Note  Plan:  Educated on apixaban, ASA, Plavix, rosuvastatin, ferrous sulfate, Lasix.  Addressed concerns regarding signs of bleeding.  Follow-up 3/9 with cardiology.  --------------------------------------------- Kristina Howell is an 81 y.o. female who presents with a chief complaint of dyspnea. In anticipation of discharge, pharmacy has reviewed this patient's prior to admission medication history, as well as current inpatient medications listed per the [facility].  Current medication indications, dosing, frequency, and notable side effects reviewed with patient. patient verbalized understanding of current inpatient medication regimen and is aware that the After Visit Summary when presented, will represent the most accurate medication list at discharge.   Kristina Howell expressed concerns regarding signs of bleeding. Discussed potential signs to watch out for: bleeding gums, coughing/vomiting blood, bloody stools or dark black stools, unusual bruising.    Kristina Howell was further counseled on the side effects of her rosuvastatin, including muscle aches. She was counseled on GI side effects of iron. We also reiterated that iron is better taken on an empty stomach for absorption. We counseled her that her furosemide dose was increased to 80mg  daily and her apixaban dose was decreased to 2.5mg  BID.   Assessment: Understanding of regimen: good Understanding of indications: good Potential of compliance: good Barriers to Obtaining Medications: No  Patient instructed to contact inpatient pharmacy team with further questions or concerns if needed.    Time spent preparing for discharge counseling: 10 minutes Time spent counseling patient: 10 minutes   Thank you for allowing pharmacy to be a part of this patient's care.  Jacqulyn Ducking Jih  Wyonia Hough), PharmD  PGY1 Pharmacy Resident Pager: [phone] 09/16/2016 1:43 PM

## 2016-09-16 NOTE — Progress Notes (Signed)
Inpatient Diabetes Program Recommendations  AACE/ADA: New Consensus Statement on Inpatient Glycemic Control (2015)  Target Ranges:  Prepandial:   less than 140 mg/dL      Peak postprandial:   less than 180 mg/dL (1-2 hours)      Critically ill patients:  140 - 180 mg/dL   Lab Results  Component Value Date   GLUCAP 230 (H) 09/16/2016   HGBA1C 10.3 (H) 08/30/2016    Review of Glycemic Control -   Blood sugars continue to be in the 200s. Needs insulin adjustment. Eating 100%.  Inpatient Diabetes Program Recommendations:    Increase Lantus to 40 units QHS Add meal coverage insulin - Novolog 4 units tidwc  Will continue to follow. Thank you. Lorenda Peck, RD, LDN, CDE Inpatient Diabetes Coordinator [phone]

## 2016-09-16 NOTE — Progress Notes (Signed)
Pt ambulated in the hallway, oxygen saturation stayed between 99-100% on room air.

## 2016-09-16 NOTE — Progress Notes (Signed)
Pt has orders to be discharged. Discharge instructions given and pt has no additional questions at this time. Medication regimen reviewed and pt educated. Pt verbalized understanding and has no additional questions. Telemetry box removed. IV removed and site in good condition. Pt stable and waiting for transportation. 

## 2016-09-17 ENCOUNTER — Encounter: Payer: Self-pay | Admitting: *Deleted

## 2016-09-17 ENCOUNTER — Other Ambulatory Visit: Payer: Self-pay | Admitting: *Deleted

## 2016-09-17 NOTE — Patient Outreach (Addendum)
[place] [facility]) Care Management  [place]  09/17/2016   Kristina Howell 1936/03/22 [id number]   Referral from [place] for Transition of care: Hospital inpatient admission 2/11-09/16/2016  Subjective:  Telephone call to patient who voices that she was admitted with severe shortness of breath, chest tightness and swelling to lower body.  States was diagnosed with heart failure. Voices that she is feeling much better now but still has some swelling in feet & ankles. Voices that she has workable scales and plans to start weighing herself daily. States she is home with support from spouse & daughter who is a Software engineer. Voices that she will have home health services and they will call to make appointment next week.  Patient states that she & daughter manage her medications. Voices that she has all of her medications & is taking as prescribed by her doctors. Voices that she has appointment scheduled with primary care provider 3/6 and heart specialist 3/9 and that family will provide transportation to appointment.    Objective: Per medical record; past medical history includes Chronic Atrial fibrillation, DM, HTN, Obesity, neuropathic foot ulcer.    Encounter Medications:  Outpatient Encounter Prescriptions as of 09/17/2016  Medication Sig Note  . allopurinol (ZYLOPRIM) 300 MG tablet Take 300 mg by mouth daily.   Marland Kitchen apixaban (ELIQUIS) 2.5 MG TABS tablet Take 1 tablet (2.5 mg total) by mouth 2 (two) times daily.   Marland Kitchen aspirin 81 MG chewable tablet Chew 1 tablet (81 mg total) by mouth daily.   . benazepril (LOTENSIN) 40 MG tablet Take 40 mg by mouth daily.   . clopidogrel (PLAVIX) 75 MG tablet Take 1 tablet (75 mg total) by mouth daily with breakfast.   . ferrous sulfate 325 (65 FE) MG tablet Take 1 tablet (325 mg total) by mouth 2 (two) times daily with a meal.   . furosemide (LASIX) 80 MG tablet Take 1 tablet (80 mg total) by mouth daily.   . insulin glargine (LANTUS) 100  UNIT/ML injection Inject 20 Units into the skin at bedtime. 09/17/2016: Patient states she was instructed to increase to 28 units by MD  . metoprolol succinate (TOPROL-XL) 50 MG 24 hr tablet Take 50 mg by mouth at bedtime.   . rosuvastatin (CRESTOR) 10 MG tablet Take 2 tablets (20 mg total) by mouth daily.    No facility-administered encounter medications on file as of 09/17/2016.     Functional Status:  In your present state of health, do you have any difficulty performing the following activities: 09/17/2016 08/29/2016  Hearing? N N  Vision? N N  Difficulty concentrating or making decisions? N N  Walking or climbing stairs? Y N  Dressing or bathing? N N  Doing errands, shopping? Y N    Fall/Depression Screening: Fall Risk  09/17/2016  Falls in the past year? No  Risk for fall due to : Impaired mobility;Other (Comment)  Risk for fall due to (comments): currently using walker since hospital discharge   [place] 2/9 Scores 09/17/2016  PHQ - 2 Score 0    Assessment:  Recent discharge 3/1 Agrees to transition of care calls Dx Heart failure Patient has family support Will have [place] no breathing difficulty today No difficulty with medications Plans to start weighing daily & record  Plan: Develop care plan with patient. [facility] CM Care Plan Problem One   Flowsheet Row Most Recent Value  Care Plan Problem One  At risk for hospital readmission   Role Documenting  the Problem One  Care Management Telephonic Coordinator  Care Plan for Problem One  Active  THN Long Term Goal (31-90 days)  Avoid admission within 30 days of previous discharge  THN Long Term Goal Start Date  09/17/16  Interventions for Problem One Long Term Goal  explain importance of hospital follow ups  visits, med adherence, early reporting of symptoms  THN CM Short Term Goal #1 (0-30 days)  Pt will report attendance to hospital follow up w/in 14 days        with PCP  [facility] CM Short Term Goal #1 Start Date   09/17/16  Interventions for Short Term Goal #1  Advise of importance of f/u appt,    THN CM Short Term Goal #2 (0-30 days)  Patient will report  within 14 days that she is taking meds as prescribed consistently  THN CM Short Term Goal #2 Start Date  09/17/16  Interventions for Short Term Goal #2  Review meds with patient Electa Sniff of importance of taking as prescribed  THN CM Short Term Goal #3 (0-30 days)  Pt will state 3 reportable HF signs  within 30 days  THN CM Short Term Goal #3 Start Date  09/17/16  Interventions for Short Tern Goal #3  Review HF zones, send educational literature & review      Follow up calls weekly/next appointment set with patient agreement. Complete health assessments. Send MD involvement letter & initial assessment. Send educational information to patient.  [name], RN BSN [place] Management Coordinator [facility] Care Management  [phone]

## 2016-09-19 ENCOUNTER — Encounter: Payer: Self-pay | Admitting: *Deleted

## 2016-09-20 ENCOUNTER — Encounter: Payer: Self-pay | Admitting: *Deleted

## 2016-09-21 DIAGNOSIS — I10 Essential (primary) hypertension: Secondary | ICD-10-CM | POA: Diagnosis not present

## 2016-09-21 DIAGNOSIS — I482 Chronic atrial fibrillation: Secondary | ICD-10-CM | POA: Diagnosis not present

## 2016-09-21 DIAGNOSIS — E782 Mixed hyperlipidemia: Secondary | ICD-10-CM | POA: Diagnosis not present

## 2016-09-21 DIAGNOSIS — E1165 Type 2 diabetes mellitus with hyperglycemia: Secondary | ICD-10-CM | POA: Diagnosis not present

## 2016-09-23 ENCOUNTER — Other Ambulatory Visit: Payer: Self-pay | Admitting: *Deleted

## 2016-09-23 NOTE — Patient Outreach (Signed)
[place] [facility]) Care Management  09/23/2016  Kristina Howell 04-05-1936 [id number]  Transition of care call #2  Telephone call to patient who advised that she has completed primary care follow up appointment 09/21/2016. States no changes in medications. States appointment with cardiologist set for April 13th -Dr. Domenic Polite. Voices that family member will provide transportation.   Patient voices that she has received educational literature and has started to read but not completed yet. States she also has calendar/planner that was sent. .  States she knows she needs to report chest pian, shortness of breath & swelling to medical professional.  Plan: Update care plan & follow as noted. Review heart literature with patient after she has completed her review of materials(heart failure zones, action plan) Set appointment with patient agreement.

## 2016-09-24 ENCOUNTER — Ambulatory Visit: Payer: PPO | Admitting: Physician Assistant

## 2016-09-30 ENCOUNTER — Other Ambulatory Visit: Payer: Self-pay | Admitting: *Deleted

## 2016-09-30 NOTE — Patient Outreach (Signed)
[place] [facility]) Care Management  09/30/2016  Latonia Conrow 1936/01/17 [id number]  Transition of care call #3.  Patient  Spoke with patient who voices that she is feeling stronger. States she walks in her home 4-5 times daily. Voices she has not had any falls. States no hospital admissions or emergency room visits since recent discharge from hospital. Voices that she has had loose stool -1 per day for last several days. States she is not having abdominal pain, fever or loss of appetite. States she feels it may be medication. Advised patient to continue to keep herself hydrated and report symptoms to her primary care provider. States she has lab appointment at MD office in several days & that she will advise MD office at that time regarding her symptoms.   Patient was able to voice 3 reportable symptoms of heart failure.  Reviewed Heart failure zones with patient. Patient voices understanding.  Plan: Follow care plan as noted. Follw up with patient who agrees with set appointment.   [name], RN BSN [place] Management Coordinator [facility] Care Management  [phone]

## 2016-10-07 ENCOUNTER — Ambulatory Visit: Payer: Self-pay | Admitting: *Deleted

## 2016-10-08 ENCOUNTER — Other Ambulatory Visit: Payer: Self-pay | Admitting: Family Medicine

## 2016-10-08 ENCOUNTER — Other Ambulatory Visit: Payer: Self-pay | Admitting: Cardiology

## 2016-10-08 ENCOUNTER — Other Ambulatory Visit: Payer: Self-pay | Admitting: *Deleted

## 2016-10-08 NOTE — Patient Outreach (Signed)
[facility]) Care Management  10/08/2016  Kristina Howell [birth year] [id number]  Telephone call to patient; left HIPPA compliant voice mail requesting call back.  Plan:  Will follow up.   [name], RN BSN [place] Management Coordinator [facility] Care Management  [phone]

## 2016-10-12 DIAGNOSIS — L89892 Pressure ulcer of other site, stage 2: Secondary | ICD-10-CM | POA: Diagnosis not present

## 2016-10-12 DIAGNOSIS — E1151 Type 2 diabetes mellitus with diabetic peripheral angiopathy without gangrene: Secondary | ICD-10-CM | POA: Diagnosis not present

## 2016-10-12 DIAGNOSIS — E1140 Type 2 diabetes mellitus with diabetic neuropathy, unspecified: Secondary | ICD-10-CM | POA: Diagnosis not present

## 2016-10-15 ENCOUNTER — Other Ambulatory Visit: Payer: Self-pay | Admitting: *Deleted

## 2016-10-15 NOTE — Patient Outreach (Signed)
[place] [facility]) Care Management  10/15/2016  Kristina Howell [birth year] [id number]  Telephone call follow up attempt x 2 for transition of care; left message on voice mail requesting return call.  Plan: Will follow up.  [name], RN BSN [place] Management Coordinator [facility] Care Management  [phone]

## 2016-10-16 DIAGNOSIS — E782 Mixed hyperlipidemia: Secondary | ICD-10-CM | POA: Diagnosis not present

## 2016-10-16 DIAGNOSIS — I482 Chronic atrial fibrillation: Secondary | ICD-10-CM | POA: Diagnosis not present

## 2016-10-16 DIAGNOSIS — I10 Essential (primary) hypertension: Secondary | ICD-10-CM | POA: Diagnosis not present

## 2016-10-16 DIAGNOSIS — Z6841 Body Mass Index (BMI) 40.0 and over, adult: Secondary | ICD-10-CM | POA: Diagnosis not present

## 2016-10-16 DIAGNOSIS — I5033 Acute on chronic diastolic (congestive) heart failure: Secondary | ICD-10-CM | POA: Diagnosis not present

## 2016-10-16 DIAGNOSIS — I2510 Atherosclerotic heart disease of native coronary artery without angina pectoris: Secondary | ICD-10-CM | POA: Diagnosis not present

## 2016-10-16 DIAGNOSIS — E1165 Type 2 diabetes mellitus with hyperglycemia: Secondary | ICD-10-CM | POA: Diagnosis not present

## 2016-10-16 DIAGNOSIS — R609 Edema, unspecified: Secondary | ICD-10-CM | POA: Diagnosis not present

## 2016-10-19 DIAGNOSIS — L89892 Pressure ulcer of other site, stage 2: Secondary | ICD-10-CM | POA: Diagnosis not present

## 2016-10-19 DIAGNOSIS — E1140 Type 2 diabetes mellitus with diabetic neuropathy, unspecified: Secondary | ICD-10-CM | POA: Diagnosis not present

## 2016-10-19 DIAGNOSIS — E1151 Type 2 diabetes mellitus with diabetic peripheral angiopathy without gangrene: Secondary | ICD-10-CM | POA: Diagnosis not present

## 2016-10-22 ENCOUNTER — Ambulatory Visit: Payer: Self-pay | Admitting: *Deleted

## 2016-10-26 ENCOUNTER — Other Ambulatory Visit: Payer: Self-pay | Admitting: *Deleted

## 2016-10-26 NOTE — Patient Outreach (Signed)
[place] [facility]) Care Management  10/26/2016  Jaidin Ugarte 1935-08-31 [id number]  Follow up call x 3 after 2 unsuccessful call attempts.    Telephone call to patient who voice that she is feeling stronger. Voices that she has not been admitted to hospital & been to emergency room since previous hospital discharge on 09/16/2016.  States she has no swelling & is weighing daily. Voices that she weighs 10 lbs less than upon hospital discharge. State taking medications as prescribed by her MD. States she has appointment with cardiologist this week.   Plan: Update care plan. Re-enforcement HF zones Follow up set with agreement with patient.  [name], RN BSN [place] Management Coordinator [facility] Care Management  [phone]

## 2016-10-29 ENCOUNTER — Ambulatory Visit (INDEPENDENT_AMBULATORY_CARE_PROVIDER_SITE_OTHER): Payer: PPO | Admitting: Cardiology

## 2016-10-29 ENCOUNTER — Encounter: Payer: Self-pay | Admitting: Cardiology

## 2016-10-29 VITALS — BP 168/84 | HR 66 | Ht 66.0 in | Wt 272.0 lb

## 2016-10-29 DIAGNOSIS — D500 Iron deficiency anemia secondary to blood loss (chronic): Secondary | ICD-10-CM

## 2016-10-29 DIAGNOSIS — I5023 Acute on chronic systolic (congestive) heart failure: Secondary | ICD-10-CM

## 2016-10-29 DIAGNOSIS — E782 Mixed hyperlipidemia: Secondary | ICD-10-CM | POA: Diagnosis not present

## 2016-10-29 DIAGNOSIS — I482 Chronic atrial fibrillation, unspecified: Secondary | ICD-10-CM

## 2016-10-29 DIAGNOSIS — I25119 Atherosclerotic heart disease of native coronary artery with unspecified angina pectoris: Secondary | ICD-10-CM

## 2016-10-29 DIAGNOSIS — N183 Chronic kidney disease, stage 3 unspecified: Secondary | ICD-10-CM

## 2016-10-29 DIAGNOSIS — I255 Ischemic cardiomyopathy: Secondary | ICD-10-CM | POA: Diagnosis not present

## 2016-10-29 MED ORDER — APIXABAN 2.5 MG PO TABS: 2.5000 mg | ORAL_TABLET | Freq: Two times a day (BID) | ORAL | 1 refills | Status: DC

## 2016-10-29 MED ORDER — ROSUVASTATIN CALCIUM 10 MG PO TABS: 20.0000 mg | ORAL_TABLET | Freq: Every day | ORAL | 1 refills | Status: DC

## 2016-10-29 MED ORDER — CLOPIDOGREL BISULFATE 75 MG PO TABS: 75.0000 mg | ORAL_TABLET | Freq: Every day | ORAL | 1 refills | Status: DC

## 2016-10-29 MED ORDER — FUROSEMIDE 80 MG PO TABS: 80.0000 mg | ORAL_TABLET | Freq: Every day | ORAL | 1 refills | Status: DC

## 2016-10-29 NOTE — Progress Notes (Signed)
Cardiology Office Note  Date: 10/29/2016   ID: Renetta Chalk, DOB Oct 16, 1935, MRN [id number]  PCP: Jeri [place]  Evaluating Cardiologist: Rozann Lesches, MD   Chief Complaint  Patient presents with  . Hospitalization Follow-up    History of Present Illness: Kristina Howell is a 81 y.o. female presenting for a post hospital follow-up. I have not seen her as a patient before, but I do see her husband. I could not locate a discharge summary unfortunately but did review extensive records from [facility] regarding prolonged hospitalization in February to March. She presented with chest pain and heart failure symptoms with baseline history of atrial fibrillation, type 2 diabetes mellitus, morbid obesity, hypertension, and CKD stage IV. She was found to have an LVEF of 45%. Cardiac catheterization revealed multivessel CAD which was managed with PCI, overall felt to be a poor candidate for surgical revascularization. She underwent placement of DES to the LAD and RCA in staged fashion. She followed in the past with Dr. Hamilton Capri in the [facility] cardiology practice, not seen since 2015.  She comes in today with her daughter-in-law. Still feels weak but is steadily getting better. She is using a walker when she is up on her feet. States that she is had a slowly resolving right flank hematoma, getting softer. Nonpainful. She does not report any angina symptoms or palpitations.  I reviewed her medications in detail. She had several questions. It turns out she had run out of Plavix but was taking her husband's Plavix. She also needed refills on several other medications.  I went over the results of her recent cardiac testing and lab work. She remains on iron with anemia that has reportedly been improving on follow-up with her PCP. She does not report any bleeding problems.  Past Medical History:  Diagnosis Date  . Atrial fibrillation ([place])   . CAD (coronary artery disease)    Multivessel, DES to LAD and RCA 09/2016  . Cardiomyopathy ([facility])    LVEF 45%  . Essential hypertension   . Gout   . Pancreatitis, recurrent ([place])   . Type 2 diabetes mellitus ([place])     Past Surgical History:  Procedure Laterality Date  . CHOLECYSTECTOMY    . CORONARY STENT INTERVENTION N/A 09/08/2016   Procedure: Coronary Stent Intervention;  Surgeon: Belva Crome, MD;  Location: [place] CV LAB;  Service: Cardiovascular;  Laterality: N/A;  . LEFT HEART CATH AND CORONARY ANGIOGRAPHY N/A 09/03/2016   Procedure: Left Heart Cath and Coronary Angiography;  Surgeon: Nelva Bush, MD;  Location: [place] CV LAB;  Service: Cardiovascular;  Laterality: N/A;  . TONSILLECTOMY      Current Outpatient Prescriptions  Medication Sig Dispense Refill  . allopurinol (ZYLOPRIM) 300 MG tablet Take 300 mg by mouth daily.    Marland Kitchen apixaban (ELIQUIS) 2.5 MG TABS tablet Take 1 tablet (2.5 mg total) by mouth 2 (two) times daily. 180 tablet 1  . benazepril (LOTENSIN) 40 MG tablet Take 40 mg by mouth daily.    . clopidogrel (PLAVIX) 75 MG tablet Take 1 tablet (75 mg total) by mouth daily with breakfast. 90 tablet 1  . ferrous sulfate 325 (65 FE) MG tablet Take 1 tablet (325 mg total) by mouth 2 (two) times daily with a meal. 60 tablet 3  . furosemide (LASIX) 80 MG tablet Take 1 tablet (80 mg total) by mouth daily. 90 tablet 1  . Insulin Glargine (TOUJEO SOLOSTAR) 300 UNIT/ML SOPN Inject into the skin.    Marland Kitchen  metoprolol succinate (TOPROL-XL) 50 MG 24 hr tablet Take 50 mg by mouth at bedtime.    . rosuvastatin (CRESTOR) 10 MG tablet Take 2 tablets (20 mg total) by mouth daily. 180 tablet 1   No current facility-administered medications for this visit.    Allergies:  Patient has no known allergies.   Social History: The patient  reports that she has never smoked. She has never used smokeless tobacco. She reports that she does not drink alcohol or use drugs.   Family History: The patient's family history  includes Heart disease in her father.   ROS:  Please see the history of present illness. Otherwise, complete review of systems is positive for generalized weakness, hip pain that is chronic.  All other systems are reviewed and negative.   Physical Exam: VS:  BP (!) 168/84   Pulse 66   Ht 5\' 6"  (1.676 m)   Wt 272 lb (123.4 kg)   SpO2 98%   BMI 43.90 kg/m , BMI Body mass index is 43.9 kg/m.  Wt Readings from Last 3 Encounters:  10/29/16 272 lb (123.4 kg)  09/23/16 260 lb (117.9 kg)  09/16/16 284 lb 9.6 oz (129.1 kg)    General: Morbidly obese woman in no distress. Using a walker. HEENT: Conjunctiva and lids normal, oropharynx clear. Neck: Supple, no elevated JVP or carotid bruits, no thyromegaly. Lungs: Diminished breath sounds without crackles, nonlabored breathing at rest. Cardiac: Irregularly irregular, no S3, 2/6 systolic murmur, no pericardial rub. Abdomen: Obese with pannus, nontender, bowel sounds present, no guarding or rebound. Extremities: Appearing bilateral leg edema, distal pulses 1+. Skin: Warm and dry. Musculoskeletal: No kyphosis. Neuropsychiatric: Alert and oriented x3, affect grossly appropriate.  ECG: I personally reviewed the tracing from 09/09/2016 which showed rate-controlled atrial fibrillation with poor R-wave progression consistent with old anterior infarct and nonspecific ST-T wave abnormalities.  Recent Labwork: 08/29/2016: B Natriuretic Peptide 862.2 09/03/2016: ALT 25; AST 24 09/16/2016: BUN 47; Creatinine, Ser 2.28; Hemoglobin 9.0; Platelets 331; Potassium 4.0; Sodium 133     Component Value Date/Time   CHOL 242 (H) 08/30/2016 0206   TRIG 130 08/30/2016 0206   HDL 36 (L) 08/30/2016 0206   CHOLHDL 6.7 08/30/2016 0206   VLDL 26 08/30/2016 0206   LDLCALC 180 (H) 08/30/2016 0206    Other Studies Reviewed Today:  Echocardiogram 08/30/2016: Study Conclusions  - Left ventricle: The cavity size was normal. Wall thickness was   increased in a pattern  of mild LVH. Severe hypokinesis of the mid   anteroseptal wall, apical septal wall, true apex, mid to apical   anterior wall. Indeterminant diastolic function (atrial   fibrillation). The estimated ejection fraction was 45%. - Aortic valve: There was no stenosis. - Mitral valve: Mildly to moderately calcified annulus. There was   trivial regurgitation. - Left atrium: The atrium was mildly to moderately dilated. - Right ventricle: The cavity size was normal. Systolic function   was normal. - Pulmonary arteries: No complete TR doppler jet so unable to   estimate PA systolic pressure. - Systemic veins: IVC measured 2.3 cm with < 50% respirophasic   variation, suggesting RA pressure 15 mmHg.  Impressions:  - Normal LV size with mild LV hypertrophy. EF 45% with wall motion   abnormalities as described above, in LAD coronary distribution.   Normal RV size and systolic function. No significant valvular   abnormalities.  Cardiac catheterization 09/03/2016: Conclusions: 1. Multivessel CAD, including 90% mid LAD stenosis involving small diagonal branch, 70% ostial/proximal  ramus lesion, 30% OM stenosis and sequential 70-80% proximal/mid RCA stenoses. 2. Mildly elevated left ventricular filling pressure. 3. Mild to moderate aortic valve gradient, which is suboptimally evaluated due to heart rate variability related to atrial fibrillation. 4. Right radial artery stenosis, which is likely a combination of fixed disease and vasospasm. Stenosis was successfully navigated with angled micropuncture and Versacore wires.  Recommendations: 1. Medical optimization, including further diuresis and close monitoring of renal function. 2. Plan for stage PCI to mid LAD and proximal/mid RCA next week when volume status has improved and renal function has improved/stabilized. 3. Restart heparin infusion 4 hours after TR band removal.  Cardiac PCI 09/08/2016:  CHIP due to renal insufficiency, long diffuse  disease in LAD and right coronary, in an elderly diabetic patient felt to be a poor candidate for CABG. Greatest risk is acute kidney injury and access site bleeding. Contrast during the procedure 160 cc. Small hematoma present above cath site at completion of procedure.  Successful LAD provisional stenting with reduction in 90% stenosis to 0% using a 28 x 2.75 Synergy DES postdilated to 3.0 mm in diameter. TIMI grade 2 flow was noted in the diagonal post procedure as was a case prior to the procedure.  Successful proximal to mid RCA stenting with overlapping 3.5 Promus Premier 24 and 8 mm stents. Stents were postdilated to 4.0 mm reducing stenosis to 0% with TIMI grade 3 flow.  Assessment and Plan:  Medically complex patient with several issues to consider:  1. Recent diagnosis of CAD in the setting of unstable angina and heart failure. She is status post placement of DES to the LAD and RCA in February. I reviewed her medications in detail. Plan is to stop aspirin and continue with Plavix longer term since she is concurrently on Eliquis. He does not report any angina at this time.  2. Chronic atrial fibrillation. Heart rate is adequately controlled today on beta blocker. She is tolerating Eliquis which will be continued at same dose for now, refill provided.  3. Blood loss anemia, reportedly improving on follow-up with PCP. She has additional CBC planned in May and will continue on iron supplements for now.  4. 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  5. Cardiomyopathy with LVEF approximately 45%. She does have evidence of volume overload with acute on chronic systolic heart failure based on leg edema. I asked her to increase her Lasix to 120 mg daily for the next few days and then resume prior dose at 80 mg daily with adjustments made as needed.  6. Morbid obesity. Weight loss discussed.  7. Hyperlipidemia, tolerating Crestor. Refill provided. Lipid panel from February noted. Will plan to repeat at a  later date.  Current medicines were reviewed with the patient today.   Orders Placed This Encounter  Procedures  . Basic Metabolic Panel (BMET)    Disposition: Follow-up in one month.  Signed, Satira Sark, MD, [facility] 10/29/2016 4:30 PM    [place] [facility] [place], [place], [place] [address] Phone: [phone]; Fax: [phone]

## 2016-10-29 NOTE — Patient Instructions (Signed)
Medication Instructions:  STOP TAKING ASPIRIN Your physician recommends that you continue on your current medications as directed. Please refer to the Current Medication list given to you today.  Labwork: BMET PRIOR TO NEXT APPOINTMENT ORDERS ATTACHED  Testing/Procedures: NONE  Follow-Up: Your physician wants you to follow-up in: 1 MONTH WITH DR. MCDOWELL  Any Other Special Instructions Will Be Listed Below (If Applicable). Refills sent to pharmacy on the following: Eliquis, Lasix, Crestor & Plavix  If you need a refill on your cardiac medications before your next appointment, please call your pharmacy.

## 2016-11-02 DIAGNOSIS — E1140 Type 2 diabetes mellitus with diabetic neuropathy, unspecified: Secondary | ICD-10-CM | POA: Diagnosis not present

## 2016-11-02 DIAGNOSIS — L89892 Pressure ulcer of other site, stage 2: Secondary | ICD-10-CM | POA: Diagnosis not present

## 2016-11-02 DIAGNOSIS — E1151 Type 2 diabetes mellitus with diabetic peripheral angiopathy without gangrene: Secondary | ICD-10-CM | POA: Diagnosis not present

## 2016-11-09 ENCOUNTER — Other Ambulatory Visit: Payer: Self-pay | Admitting: *Deleted

## 2016-11-10 ENCOUNTER — Encounter: Payer: Self-pay | Admitting: *Deleted

## 2016-11-10 NOTE — Patient Outreach (Signed)
[place] [facility]) Care Management  11/10/2016  Kristina Howell 01/14/1936 [id number]  Follow up call to patient:  Patient gave HIPPA verification. States she is feeling much better.  Voices that she is stronger & able to get around with her walker. States she has not had any hospital admissions since Feb 2018.   States attending MD appointments & most recently saw primary care provider. States family members provide transportation to MD appointments. States she continues to manage her medications & takes them consistently as ordered by her doctors. States her daughter is a Software engineer & she will assist her if needed.   Patient voices understanding of reportable symptoms to MD as if relates to heart failure. States she understands Heart failure zones & will call [phone] if necessary.  Patient states she feel comfortable with her knowledge of heart failure. Does not feel she needs any further educational calls.   Care plan goals have been met.    Plan: Close case./send to care management assistant. Send MD closure letter.  Kristina Daisy, RN BSN [place] Management Coordinator [facility] Care Management  [phone]

## 2016-11-22 DIAGNOSIS — E1140 Type 2 diabetes mellitus with diabetic neuropathy, unspecified: Secondary | ICD-10-CM | POA: Diagnosis not present

## 2016-11-22 DIAGNOSIS — E1151 Type 2 diabetes mellitus with diabetic peripheral angiopathy without gangrene: Secondary | ICD-10-CM | POA: Diagnosis not present

## 2016-11-22 DIAGNOSIS — L89892 Pressure ulcer of other site, stage 2: Secondary | ICD-10-CM | POA: Diagnosis not present

## 2016-12-01 DIAGNOSIS — I10 Essential (primary) hypertension: Secondary | ICD-10-CM | POA: Diagnosis not present

## 2016-12-01 DIAGNOSIS — D649 Anemia, unspecified: Secondary | ICD-10-CM | POA: Diagnosis not present

## 2016-12-01 DIAGNOSIS — I2510 Atherosclerotic heart disease of native coronary artery without angina pectoris: Secondary | ICD-10-CM | POA: Diagnosis not present

## 2016-12-01 DIAGNOSIS — I5033 Acute on chronic diastolic (congestive) heart failure: Secondary | ICD-10-CM | POA: Diagnosis not present

## 2016-12-01 DIAGNOSIS — E1165 Type 2 diabetes mellitus with hyperglycemia: Secondary | ICD-10-CM | POA: Diagnosis not present

## 2016-12-01 DIAGNOSIS — E782 Mixed hyperlipidemia: Secondary | ICD-10-CM | POA: Diagnosis not present

## 2016-12-01 DIAGNOSIS — E11621 Type 2 diabetes mellitus with foot ulcer: Secondary | ICD-10-CM | POA: Diagnosis not present

## 2016-12-01 DIAGNOSIS — I482 Chronic atrial fibrillation: Secondary | ICD-10-CM | POA: Diagnosis not present

## 2016-12-06 DIAGNOSIS — Z6841 Body Mass Index (BMI) 40.0 and over, adult: Secondary | ICD-10-CM | POA: Diagnosis not present

## 2016-12-06 DIAGNOSIS — I10 Essential (primary) hypertension: Secondary | ICD-10-CM | POA: Diagnosis not present

## 2016-12-06 DIAGNOSIS — M109 Gout, unspecified: Secondary | ICD-10-CM | POA: Diagnosis not present

## 2016-12-06 DIAGNOSIS — E782 Mixed hyperlipidemia: Secondary | ICD-10-CM | POA: Diagnosis not present

## 2016-12-06 DIAGNOSIS — L89321 Pressure ulcer of left buttock, stage 1: Secondary | ICD-10-CM | POA: Diagnosis not present

## 2016-12-06 DIAGNOSIS — I482 Chronic atrial fibrillation: Secondary | ICD-10-CM | POA: Diagnosis not present

## 2016-12-06 DIAGNOSIS — E1165 Type 2 diabetes mellitus with hyperglycemia: Secondary | ICD-10-CM | POA: Diagnosis not present

## 2016-12-06 DIAGNOSIS — Z0001 Encounter for general adult medical examination with abnormal findings: Secondary | ICD-10-CM | POA: Diagnosis not present

## 2016-12-06 DIAGNOSIS — R609 Edema, unspecified: Secondary | ICD-10-CM | POA: Diagnosis not present

## 2016-12-10 ENCOUNTER — Encounter: Payer: Self-pay | Admitting: Cardiology

## 2016-12-10 ENCOUNTER — Ambulatory Visit (INDEPENDENT_AMBULATORY_CARE_PROVIDER_SITE_OTHER): Payer: PPO | Admitting: Cardiology

## 2016-12-10 VITALS — BP 150/80 | HR 53 | Ht 66.0 in | Wt 274.0 lb

## 2016-12-10 DIAGNOSIS — I482 Chronic atrial fibrillation, unspecified: Secondary | ICD-10-CM

## 2016-12-10 DIAGNOSIS — I255 Ischemic cardiomyopathy: Secondary | ICD-10-CM | POA: Diagnosis not present

## 2016-12-10 DIAGNOSIS — R600 Localized edema: Secondary | ICD-10-CM

## 2016-12-10 DIAGNOSIS — R609 Edema, unspecified: Secondary | ICD-10-CM

## 2016-12-10 DIAGNOSIS — D500 Iron deficiency anemia secondary to blood loss (chronic): Secondary | ICD-10-CM | POA: Diagnosis not present

## 2016-12-10 DIAGNOSIS — I25119 Atherosclerotic heart disease of native coronary artery with unspecified angina pectoris: Secondary | ICD-10-CM

## 2016-12-10 MED ORDER — METOLAZONE 2.5 MG PO TABS: 2.5000 mg | ORAL_TABLET | ORAL | 1 refills | Status: DC

## 2016-12-10 NOTE — Progress Notes (Signed)
Cardiology Office Note  Date: 12/10/2016   ID: Gem Conkle, DOB 01-17-1936, MRN [id number]  PCP: Tawni Carnes, PA-C  Primary Cardiologist: Rozann Lesches, MD   Chief Complaint  Patient presents with  . Coronary Artery Disease  . Cardiomyiopathy    History of Present Illness: Kristina Howell is a 81 y.o. female last seen in April. She is here today for follow-up. Gradually improving in terms of strength, but is still functionally limited and has to pace her self. She does not report any angina symptoms or palpitations. Has had some neuropathic discomfort in her shoulders and neck. We discussed her leg edema and lymphedema which is been chronic. She reports compliance with Lasix. Her weight is up about 2 pounds.  We discussed obtaining follow-up lab work. She states that she had this done through [facility], we have not received the records. She recalls her hemoglobin had gotten up over 10 and we will plan to stop her iron supplements for now.  Current cardiac regimen includes Eliquis, Plavix, Lotensin, Lasix, Toprol-XL, and Crestor.  Past Medical History:  Diagnosis Date  . Atrial fibrillation ([place])   . CAD (coronary artery disease)    Multivessel, DES to LAD and RCA 08/2016  . Cardiomyopathy (HCC)    LVEF 45%  . CKD (chronic kidney disease) stage 3, GFR 30-59 ml/min   . Essential hypertension   . Gout   . Pancreatitis, recurrent ([place])   . Type 2 diabetes mellitus ([place])     Past Surgical History:  Procedure Laterality Date  . CHOLECYSTECTOMY    . CORONARY STENT INTERVENTION N/A 09/08/2016   Procedure: Coronary Stent Intervention;  Surgeon: Belva Crome, MD;  Location: [facility];  Service: Cardiovascular;  Laterality: N/A;  . LEFT HEART CATH AND CORONARY ANGIOGRAPHY N/A 09/03/2016   Procedure: Left Heart Cath and Coronary Angiography;  Surgeon: Nelva Bush, MD;  Location: [facility];  Service: Cardiovascular;  Laterality: N/A;  . TONSILLECTOMY       Current Outpatient Prescriptions  Medication Sig Dispense Refill  . allopurinol (ZYLOPRIM) 300 MG tablet Take 300 mg by mouth daily.    Marland Kitchen apixaban (ELIQUIS) 2.5 MG TABS tablet Take 1 tablet (2.5 mg total) by mouth 2 (two) times daily. 180 tablet 1  . benazepril (LOTENSIN) 40 MG tablet Take 40 mg by mouth daily.    . clopidogrel (PLAVIX) 75 MG tablet Take 1 tablet (75 mg total) by mouth daily with breakfast. 90 tablet 1  . furosemide (LASIX) 80 MG tablet Take 1 tablet (80 mg total) by mouth daily. 90 tablet 1  . Insulin Glargine (TOUJEO SOLOSTAR) 300 UNIT/ML SOPN Inject into the skin.    . metoprolol succinate (TOPROL-XL) 50 MG 24 hr tablet Take 50 mg by mouth at bedtime.    . rosuvastatin (CRESTOR) 10 MG tablet Take 2 tablets (20 mg total) by mouth daily. 180 tablet 1  . [START ON 12/13/2016] metolazone (ZAROXOLYN) 2.5 MG tablet Take 1 tablet (2.5 mg total) by mouth 2 (two) times a week. As needed for leg swelling. Please do not use this no more than twice weekly 8 tablet 1   No current facility-administered medications for this visit.    Allergies:  Patient has no known allergies.   Social History: The patient  reports that she has never smoked. She has never used smokeless tobacco. She reports that she does not drink alcohol or use drugs.   ROS:  Please see the history of present illness.  Otherwise, complete review of systems is positive for generalized fatigue.  All other systems are reviewed and negative.   Physical Exam: VS:  BP (!) 150/80   Pulse (!) 53   Ht 5\' 6"  (1.676 m)   Wt 274 lb (124.3 kg)   SpO2 97%   BMI 44.22 kg/m , BMI Body mass index is 44.22 kg/m.  Wt Readings from Last 3 Encounters:  12/10/16 274 lb (124.3 kg)  10/29/16 272 lb (123.4 kg)  09/23/16 260 lb (117.9 kg)    General: Morbidly obese woman in no distress. HEENT: Conjunctiva and lids normal, oropharynx clear. Neck: Supple, no elevated JVP or carotid bruits, no thyromegaly. Lungs: Diminished breath  sounds without crackles, nonlabored breathing at rest. Cardiac: Largely regular, no S3, 2/6 systolic murmur, no pericardial rub. Abdomen: Obese, nontender, bowel sounds present, no guarding or rebound. Extremities: Bilateral leg edema and lymphedema, distal pulses 1+. Skin: Warm and dry. Musculoskeletal: No kyphosis. Neuropsychiatric: Alert and oriented x3, affect grossly appropriate.  ECG: I personally reviewed the tracing from 09/09/2016 which showed rate-controlled atrial fibrillation with poor R-wave progression consistent with old anterior infarct and nonspecific ST-T wave abnormalities.  Recent Labwork: 08/29/2016: B Natriuretic Peptide 862.2 09/03/2016: ALT 25; AST 24 09/16/2016: BUN 47; Creatinine, Ser 2.28; Hemoglobin 9.0; Platelets 331; Potassium 4.0; Sodium 133     Component Value Date/Time   CHOL 242 (H) 08/30/2016 0206   TRIG 130 08/30/2016 0206   HDL 36 (L) 08/30/2016 0206   CHOLHDL 6.7 08/30/2016 0206   VLDL 26 08/30/2016 0206   LDLCALC 180 (H) 08/30/2016 0206    Other Studies Reviewed Today:  Echocardiogram 08/30/2016: Study Conclusions  - Left ventricle: The cavity size was normal. Wall thickness was increased in a pattern of mild LVH. Severe hypokinesis of the mid anteroseptal wall, apical septal wall, true apex, mid to apical anterior wall. Indeterminant diastolic function (atrial fibrillation). The estimated ejection fraction was 45%. - Aortic valve: There was no stenosis. - Mitral valve: Mildly to moderately calcified annulus. There was trivial regurgitation. - Left atrium: The atrium was mildly to moderately dilated. - Right ventricle: The cavity size was normal. Systolic function was normal. - Pulmonary arteries: No complete TR doppler jet so unable to estimate PA systolic pressure. - Systemic veins: IVC measured 2.3 cm with < 50% respirophasic variation, suggesting RA pressure 15 mmHg.  Impressions:  - Normal LV size with mild LV  hypertrophy. EF 45% with wall motion abnormalities as described above, in LAD coronary distribution. Normal RV size and systolic function. No significant valvular abnormalities.  Cardiac catheterization 09/03/2016: Conclusions: 1. Multivessel CAD, including 90% mid LAD stenosis involving small diagonal branch, 70% ostial/proximal ramus lesion, 30% OM stenosis and sequential 70-80% proximal/mid RCA stenoses. 2. Mildly elevated left ventricular filling pressure. 3. Mild to moderate aortic valve gradient, which is suboptimally evaluated due to heart rate variability related to atrial fibrillation. 4. Right radial artery stenosis, which is likely a combination of fixed disease and vasospasm. Stenosis was successfully navigated with angled micropuncture and Versacore wires.  Recommendations: 1. Medical optimization, including further diuresis and close monitoring of renal function. 2. Plan for stage PCI to mid LAD and proximal/mid RCA next week when volume status has improved and renal function has improved/stabilized. 3. Restart heparin infusion 4 hours after TR band removal.  Cardiac PCI 09/08/2016:  CHIP due to renal insufficiency, long diffuse disease in LAD and right coronary, in an elderly diabetic patient felt to be a poor candidate for CABG. Greatest  risk is acute kidney injury and access site bleeding. Contrast during the procedure 160 cc.Small hematoma present above cath site at completion of procedure.  Successful LAD provisional stenting with reduction in 90% stenosis to 0% using a 28 x 2.75 Synergy DES postdilated to 3.0 mm in diameter. TIMI grade 2 flow was noted in the diagonal post procedure as was a case prior to the procedure.  Successful proximal to mid RCA stenting with overlapping 3.5 Promus Premier 24 and 8 mm stents. Stents were postdilated to 4.0 mm reducing stenosis to 0% with TIMI grade 3 flow.  Assessment and Plan:  1. Leg edema and lymphedema. Plan to  continue current dose of Lasix and add metolazone 2.5 mg to be taken as needed, generally no more than twice in a week in light of chronic renal insufficiency. Does feel like her total volume status is improved.  2. Chronic atrial fibrillation, heart rate well controlled on beta blocker. She continues on Eliquis without bleeding problems. Requesting most recent lab work from [facility].  3. Blood loss anemia, improving with iron supplements. Requesting recent lab work, if hemoglobin is over 10 she can stop iron supplements.  4. Cardiomyopathy with LVEF approximately 45%. Adjustments being made to diuretic regimen, otherwise continue baseline medications.  5. CAD status post DES to the LAD and RCA in February of this year. She continues on Plavix. Aspirin was stopped in light of concurrent use of Eliquis.  Current medicines were reviewed with the patient today.   Orders Placed This Encounter  Procedures  . Basic metabolic panel    Disposition: Follow-up in 3 months.  Signed, Satira Sark, MD, [facility] 12/10/2016 3:39 PM    [facility] [place], [place], [place] [address] Phone: [phone]; Fax: [phone]

## 2016-12-10 NOTE — Patient Instructions (Signed)
Medication Instructions:   Your physician has recommended you make the following change in your medication:   Stop ferrous sulfate.  Start metolazone 2.5 mg as needed for leg swelling no more than twice weekly.  Continue all other medications the same.  Labwork:  Your physician recommends that you return for non-fasting lab work in 3 months just before your next visit to check your BMET. Your lab work order has been given to you during this visit.  Testing/Procedures:  NONE  Follow-Up:  Your physician recommends that you schedule a follow-up appointment in: 3 months.  Any Other Special Instructions Will Be Listed Below (If Applicable).  If you need a refill on your cardiac medications before your next appointment, please call your pharmacy.

## 2017-01-24 DIAGNOSIS — E1151 Type 2 diabetes mellitus with diabetic peripheral angiopathy without gangrene: Secondary | ICD-10-CM | POA: Diagnosis not present

## 2017-01-24 DIAGNOSIS — E1140 Type 2 diabetes mellitus with diabetic neuropathy, unspecified: Secondary | ICD-10-CM | POA: Diagnosis not present

## 2017-02-03 ENCOUNTER — Other Ambulatory Visit: Payer: Self-pay | Admitting: Cardiology

## 2017-02-26 DIAGNOSIS — R197 Diarrhea, unspecified: Secondary | ICD-10-CM | POA: Diagnosis not present

## 2017-02-26 DIAGNOSIS — E782 Mixed hyperlipidemia: Secondary | ICD-10-CM | POA: Diagnosis not present

## 2017-02-26 DIAGNOSIS — I482 Chronic atrial fibrillation: Secondary | ICD-10-CM | POA: Diagnosis not present

## 2017-02-26 DIAGNOSIS — Z6841 Body Mass Index (BMI) 40.0 and over, adult: Secondary | ICD-10-CM | POA: Diagnosis not present

## 2017-02-26 DIAGNOSIS — M109 Gout, unspecified: Secondary | ICD-10-CM | POA: Diagnosis not present

## 2017-02-26 DIAGNOSIS — R609 Edema, unspecified: Secondary | ICD-10-CM | POA: Diagnosis not present

## 2017-02-26 DIAGNOSIS — I10 Essential (primary) hypertension: Secondary | ICD-10-CM | POA: Diagnosis not present

## 2017-02-26 DIAGNOSIS — E1165 Type 2 diabetes mellitus with hyperglycemia: Secondary | ICD-10-CM | POA: Diagnosis not present

## 2017-02-26 LAB — HEMOGLOBIN A1C: Hemoglobin A1C: 9.9

## 2017-02-26 LAB — BASIC METABOLIC PANEL
BUN: 47 — AB (ref 4–21)
Creatinine: 2.1 — AB (ref <=1.1)

## 2017-02-26 LAB — TSH: TSH: 4.54 (ref <=5.90)

## 2017-02-26 LAB — LIPID PANEL
CHOLESTEROL: 178 (ref 0–200)
HDL: 31 — AB (ref 35–70)
LDL Cholesterol: 83
TRIGLYCERIDES: 318 — AB (ref 40–160)

## 2017-03-22 ENCOUNTER — Ambulatory Visit (INDEPENDENT_AMBULATORY_CARE_PROVIDER_SITE_OTHER): Payer: PPO | Admitting: Cardiology

## 2017-03-22 ENCOUNTER — Encounter: Payer: Self-pay | Admitting: Cardiology

## 2017-03-22 VITALS — BP 170/82 | HR 72 | Ht 66.0 in | Wt 272.6 lb

## 2017-03-22 DIAGNOSIS — I890 Lymphedema, not elsewhere classified: Secondary | ICD-10-CM

## 2017-03-22 DIAGNOSIS — I482 Chronic atrial fibrillation, unspecified: Secondary | ICD-10-CM

## 2017-03-22 DIAGNOSIS — I25119 Atherosclerotic heart disease of native coronary artery with unspecified angina pectoris: Secondary | ICD-10-CM | POA: Diagnosis not present

## 2017-03-22 DIAGNOSIS — I255 Ischemic cardiomyopathy: Secondary | ICD-10-CM

## 2017-03-22 MED ORDER — ROSUVASTATIN CALCIUM 20 MG PO TABS
20.0000 mg | ORAL_TABLET | Freq: Every day | ORAL | 3 refills | Status: DC
Start: 2017-03-22 — End: 2018-03-31

## 2017-03-22 NOTE — Patient Instructions (Signed)
Medication Instructions:  Your physician recommends that you continue on your current medications as directed. Please refer to the Current Medication list given to you today.  Labwork: none  Testing/Procedures: none  Follow-Up: Your physician recommends that you schedule a follow-up appointment in: 3 MONTHS WITH DR. MCDOWELL  Any Other Special Instructions Will Be Listed Below (If Applicable). You have been referred to [facility]   If you need a refill on your cardiac medications before your next appointment, please call your pharmacy.

## 2017-03-22 NOTE — Progress Notes (Signed)
Cardiology Office Note  Date: 03/22/2017   ID: Kristina Howell, DOB [birth year], MRN [id number]  PCP: Tawni Carnes, PA-C  Primary Cardiologist: Rozann Lesches, MD   Chief Complaint  Patient presents with  . Cardiac follow-up    History of Present Illness: Kristina Howell is a 81 y.o. female last seen in May. She presents for a follow-up visit. She denies any angina symptoms or palpitations. Her weight is down 2 pounds compared to the last visit. She is having trouble with her balance, continues to use a walker, has not had any falls.  She tells me that she had lab work with her PCP last month, has been referred to nephrology due to progressive renal insufficiency. As needed metolazone was added at the last visit to standing dose of Lasix, but she states that she has not used it.  I reviewed her current medications which are outlined below. She reports no bleeding problems continues on Eliquis and Plavix.  She still has a significant degree of lymphedema on examination and we discussed referral for mechanical compression. She is not able to get compression stockings on.  Past Medical History:  Diagnosis Date  . Atrial fibrillation ([place])   . CAD (coronary artery disease)    Multivessel, DES to LAD and RCA 08/2016  . Cardiomyopathy (HCC)    LVEF 45%  . CKD (chronic kidney disease) stage 3, GFR 30-59 ml/min   . Essential hypertension   . Gout   . Pancreatitis, recurrent ([place])   . Type 2 diabetes mellitus ([place])     Past Surgical History:  Procedure Laterality Date  . CHOLECYSTECTOMY    . CORONARY STENT INTERVENTION N/A 09/08/2016   Procedure: Coronary Stent Intervention;  Surgeon: Belva Crome, MD;  Location: [facility];  Service: Cardiovascular;  Laterality: N/A;  . LEFT HEART CATH AND CORONARY ANGIOGRAPHY N/A 09/03/2016   Procedure: Left Heart Cath and Coronary Angiography;  Surgeon: Nelva Bush, MD;  Location: [facility];  Service: Cardiovascular;   Laterality: N/A;  . TONSILLECTOMY      Current Outpatient Prescriptions  Medication Sig Dispense Refill  . allopurinol (ZYLOPRIM) 300 MG tablet Take 300 mg by mouth daily.    Marland Kitchen apixaban (ELIQUIS) 2.5 MG TABS tablet Take 1 tablet (2.5 mg total) by mouth 2 (two) times daily. 180 tablet 1  . benazepril (LOTENSIN) 40 MG tablet Take 40 mg by mouth daily.    . clopidogrel (PLAVIX) 75 MG tablet Take 1 tablet (75 mg total) by mouth daily with breakfast. 90 tablet 1  . furosemide (LASIX) 80 MG tablet Take 1 tablet (80 mg total) by mouth daily. 90 tablet 1  . Insulin Glargine (TOUJEO SOLOSTAR) 300 UNIT/ML SOPN Inject 26 Units into the skin at bedtime.     . metolazone (ZAROXOLYN) 2.5 MG tablet TAKE ONE TABLET BY MOUTH TWICE A WEEK AS NEEDED FOR LEG SWELLING, PLEASE DO NOT TAKE THIS NO MORE THAN TWICE WEEKLY, ALSO STOP FERROUS SULFA 8 tablet 1  . metoprolol succinate (TOPROL-XL) 50 MG 24 hr tablet Take 50 mg by mouth at bedtime.    . rosuvastatin (CRESTOR) 20 MG tablet Take 1 tablet (20 mg total) by mouth daily. 90 tablet 3   No current facility-administered medications for this visit.    Allergies:  Patient has no known allergies.   Social History: The patient  reports that she has never smoked. She has never used smokeless tobacco. She reports that she does not drink alcohol or  use drugs.   ROS:  Please see the history of present illness. Otherwise, complete review of systems is positive for fatigue.  All other systems are reviewed and negative.   Physical Exam: VS:  BP (!) 170/82   Pulse 72   Ht 5\' 6"  (1.676 m)   Wt 272 lb 9.6 oz (123.7 kg)   SpO2 98%   BMI 44.00 kg/m , BMI Body mass index is 44 kg/m.  Wt Readings from Last 3 Encounters:  03/22/17 272 lb 9.6 oz (123.7 kg)  12/10/16 274 lb (124.3 kg)  10/29/16 272 lb (123.4 kg)    General: Morbidly obese woman, appears comfortable at rest. Using a walker. HEENT: Conjunctiva and lids normal, oropharynx clear. Neck: Supple, no elevated  JVP or carotid bruits, no thyromegaly. Lungs: Diminished breath sounds, nonlabored breathing at rest. Cardiac: Irregular, no S3, 2/6 systolic murmur, no pericardial rub. Abdomen: Obese, nontender, bowel sounds present, no guarding or rebound. Extremities: Bilateral leg edema and lymphedema, distal pulses 1+. Skin: Warm and dry. Musculoskeletal: No kyphosis. Neuropsychiatric: Alert and oriented x3, affect grossly appropriate.  ECG: I personally reviewed the tracing from 09/09/2016 which showed rate-controlled atrial fibrillation with poor R-wave progression rule out old anterior infarct pattern and diffuse nonspecific ST-T wave abnormalities.  Recent Labwork: 08/29/2016: B Natriuretic Peptide 862.2 09/03/2016: ALT 25; AST 24 09/16/2016: BUN 47; Creatinine, Ser 2.28; Hemoglobin 9.0; Platelets 331; Potassium 4.0; Sodium 133     Component Value Date/Time   CHOL 242 (H) 08/30/2016 0206   TRIG 130 08/30/2016 0206   HDL 36 (L) 08/30/2016 0206   CHOLHDL 6.7 08/30/2016 0206   VLDL 26 08/30/2016 0206   LDLCALC 180 (H) 08/30/2016 0206    Other Studies Reviewed Today:  Echocardiogram2/06/2017: Study Conclusions  - Left ventricle: The cavity size was normal. Wall thickness was increased in a pattern of mild LVH. Severe hypokinesis of the mid anteroseptal wall, apical septal wall, true apex, mid to apical anterior wall. Indeterminant diastolic function (atrial fibrillation). The estimated ejection fraction was 45%. - Aortic valve: There was no stenosis. - Mitral valve: Mildly to moderately calcified annulus. There was trivial regurgitation. - Left atrium: The atrium was mildly to moderately dilated. - Right ventricle: The cavity size was normal. Systolic function was normal. - Pulmonary arteries: No complete TR doppler jet so unable to estimate PA systolic pressure. - Systemic veins: IVC measured 2.3 cm with < 50% respirophasic variation, suggesting RA pressure 15  mmHg.  Impressions:  - Normal LV size with mild LV hypertrophy. EF 45% with wall motion abnormalities as described above, in LAD coronary distribution. Normal RV size and systolic function. No significant valvular abnormalities.  Cardiac catheterization 09/03/2016: Conclusions: 1. Multivessel CAD, including 90% mid LAD stenosis involving small diagonal branch, 70% ostial/proximal ramus lesion, 30% OM stenosis and sequential 70-80% proximal/mid RCA stenoses. 2. Mildly elevated left ventricular filling pressure. 3. Mild to moderate aortic valve gradient, which is suboptimally evaluated due to heart rate variability related to atrial fibrillation. 4. Right radial artery stenosis, which is likely a combination of fixed disease and vasospasm. Stenosis was successfully navigated with angled micropuncture and Versacore wires.  Recommendations: 1. Medical optimization, including further diuresis and close monitoring of renal function. 2. Plan for stage PCI to mid LAD and proximal/mid RCA next week when volume status has improved and renal function has improved/stabilized. 3. Restart heparin infusion 4 hours after TR band removal.  Cardiac PCI 09/08/2016:  CHIP due to renal insufficiency, long diffuse disease in LAD  and right coronary, in an elderly diabetic patient felt to be a poor candidate for CABG. Greatest risk is acute kidney injury and access site bleeding. Contrast during the procedure 160 cc.Small hematoma present above cath site at completion of procedure.  Successful LAD provisional stenting with reduction in 90% stenosis to 0% using a 28 x 2.75 Synergy DES postdilated to 3.0 mm in diameter. TIMI grade 2 flow was noted in the diagonal post procedure as was a case prior to the procedure.  Successful proximal to mid RCA stenting with overlapping 3.5 Promus Premier 24 and 8 mm stents. Stents were postdilated to 4.0 mm reducing stenosis to 0% with TIMI grade 3 flow.  Assessment  and Plan:  1. Lymphedema, plan to refer her for mechanical compression. She continues on Lasix but has had progressive renal insufficiency and is not using metolazone. Hopefully with mechanical compression treatments she can get her leg sizes down to where she can use compression stockings on her own.  2. Chronic atrial fibrillation, heart rate well controlled on beta blocker and she continues on Eliquis for stroke prophylaxis. Requesting recent labwork.  3. Cardiomyopathy with LVEF 45%. Weight is down 2 pounds. Continue present regimen.  4. CAD status post DES to the LAD and RCA in February. She is on Plavix and statin.  Current medicines were reviewed with the patient today.   Orders Placed This Encounter  Procedures  . Ambulatory referral to Occupational Therapy    Disposition: Follow-up in 3 months.  Signed, Satira Sark, MD, [facility] 03/22/2017 4:18 PM    [facility] [place], [place], [place] [address] Phone: [phone]; Fax: [phone]

## 2017-03-24 ENCOUNTER — Telehealth (HOSPITAL_COMMUNITY): Payer: Self-pay | Admitting: Physical Therapy

## 2017-03-24 NOTE — Telephone Encounter (Signed)
Pt called to confrim this apptment. NF 03/24/17

## 2017-03-24 NOTE — Addendum Note (Signed)
Addended by: Acquanetta Chain on: 03/24/2017 01:10 PM   Modules accepted: Orders

## 2017-04-05 DIAGNOSIS — E1129 Type 2 diabetes mellitus with other diabetic kidney complication: Secondary | ICD-10-CM | POA: Diagnosis not present

## 2017-04-05 DIAGNOSIS — N184 Chronic kidney disease, stage 4 (severe): Secondary | ICD-10-CM | POA: Diagnosis not present

## 2017-04-05 DIAGNOSIS — I10 Essential (primary) hypertension: Secondary | ICD-10-CM | POA: Diagnosis not present

## 2017-04-05 DIAGNOSIS — M109 Gout, unspecified: Secondary | ICD-10-CM | POA: Diagnosis not present

## 2017-04-06 ENCOUNTER — Ambulatory Visit (HOSPITAL_COMMUNITY): Payer: PPO | Attending: Cardiology | Admitting: Physical Therapy

## 2017-04-06 DIAGNOSIS — I890 Lymphedema, not elsewhere classified: Secondary | ICD-10-CM | POA: Insufficient documentation

## 2017-04-06 NOTE — Therapy (Signed)
[facility] [address] [place], [place], [address] Phone: [phone]   Fax:  [phone]  Physical Therapy Evaluation  Patient Details  Name: Kristina Howell MRN: [id number] Date of Birth: [birth year] Referring Provider: Rozann Lesches  Encounter Date: 04/06/2017      PT End of Session - 04/06/17 1600    Visit Number 1   Number of Visits 18   Date for PT Re-Evaluation 05/06/17   Authorization Type [employer]   Authorization - Visit Number 1   Authorization - Number of Visits 10   PT Start Time 9628   PT Stop Time 1430   PT Time Calculation (min) 85 min   Activity Tolerance Patient tolerated treatment well   Behavior During Therapy [facility] for tasks assessed/performed      Past Medical History:  Diagnosis Date  . Atrial fibrillation ([place])   . CAD (coronary artery disease)    Multivessel, DES to LAD and RCA 08/2016  . Cardiomyopathy (HCC)    LVEF 45%  . CKD (chronic kidney disease) stage 3, GFR 30-59 ml/min   . Essential hypertension   . Gout   . Pancreatitis, recurrent ([place])   . Type 2 diabetes mellitus ([place])     Past Surgical History:  Procedure Laterality Date  . CHOLECYSTECTOMY    . CORONARY STENT INTERVENTION N/A 09/08/2016   Procedure: Coronary Stent Intervention;  Surgeon: Belva Crome, MD;  Location: [facility];  Service: Cardiovascular;  Laterality: N/A;  . LEFT HEART CATH AND CORONARY ANGIOGRAPHY N/A 09/03/2016   Procedure: Left Heart Cath and Coronary Angiography;  Surgeon: Nelva Bush, MD;  Location: [facility];  Service: Cardiovascular;  Laterality: N/A;  . TONSILLECTOMY      There were no vitals filed for this visit.       Subjective Assessment - 04/06/17 1307    Subjective Kristina Howell states that she has been experiencing swelling in both her legs for years. Her right leg started swelling first; her right began to swell seven years ago.  She is only able to wear one pair of  shoes at this time.  She has used compression garments in the past but at this point she is unable to get her compression garments on by herself.  She went to her MD who felt that lymphedema treatment might help her get to the point where she could don her garments to better control her edema.    Pertinent History Renal insufficiency, CAD, HT, DM    Currently in Pain? No/denies            [facility] PT Assessment - 04/06/17 0001      Assessment   Medical Diagnosis Lymphedem B LE    Referring Provider Rozann Lesches   Onset Date/Surgical Date --  years ago    Next MD Visit 06/27/2017   Prior Therapy none     Precautions   Precautions None     Restrictions   Weight Bearing Restrictions No     Balance Screen   Has the patient fallen in the past 6 months No   Has the patient had a decrease in activity level because of a fear of falling?  Yes   Is the patient reluctant to leave their home because of a fear of falling?  No     Home Ecologist residence     Prior Function   Level of Independence Independent with community mobility with  device   Vocation Retired   Leisure TV     Cognition   Overall Cognitive Status Within Functional Limits for tasks assessed     Observation/Other Assessments   Observations papilloma B LE; B LE red from ankle superior 30 cm.   life impact score 68           LYMPHEDEMA/ONCOLOGY QUESTIONNAIRE - 04/06/17 1350      What other symptoms do you have   Are you Having Heaviness or Tightness Yes   Are you having pitting edema Yes   Body Site legs   Is it Hard or Difficult finding clothes that fit Yes   Do you have infections No   Stemmer Sign Yes     Lymphedema Stage   Stage STAGE 2 SPONTANEOUSLY IRREVERSIBLE     Lymphedema Assessments   Lymphedema Assessments Lower extremities     Right Lower Extremity Lymphedema   At Midpatella/Popliteal Crease 53 cm   30 cm Proximal to Floor at Lateral Plantar Foot 52.2 cm    20 cm Proximal to Floor at Lateral Plantar Foot 39.6 1   10  cm Proximal to Floor at Lateral Malleoli 32.2 cm   Circumference of ankle/heel 37.7 cm.   5 cm Proximal to 1st MTP Joint 26.2 cm   Across MTP Joint 27 cm   Around Proximal Great Toe 9 cm     Left Lower Extremity Lymphedema   At Midpatella/Popliteal Crease 54 cm   30 cm Proximal to Floor at Lateral Plantar Foot 52.2 cm   20 cm Proximal to Floor at Lateral Plantar Foot 36.7 cm   10 cm Proximal to Floor at Lateral Malleoli 32.2 cm   Circumference of ankle/heel 37.5 cm.   5 cm Proximal to 1st MTP Joint 26 cm   Across MTP Joint 26.2 cm   Around Proximal Great Toe 9 cm         Objective measurements completed on examination: See above findings.          [facility] Adult PT Treatment/Exercise - 04/06/17 0001      Manual Therapy   Manual Therapy Manual Lymphatic Drainage (MLD)   Manual therapy comments done seperate from all other aspects of treatment    Manual Lymphatic Drainage (MLD) supraclavicular, deep and superfical abdominal followed by LT inguinal axillary anastomsis and Lt LE.  Time restraints prevented Rt from being completed.                 PT Education - 04/06/17 1559    Education provided Yes   Education Details Lymphedema and it's prognosis, LE exercises to improve lymphatic circulation,     Person(s) Educated Patient   Methods Explanation;Handout   Comprehension Verbalized understanding          PT Short Term Goals - 04/06/17 1612      PT SHORT TERM GOAL #1   Title Pt to be aware of her increased risk for cellulitis and be able to verbalize the signs and symptoms and the need to obtain antibiotics.    Time 3   Period Weeks   Status New   Target Date 04/27/17     PT SHORT TERM GOAL #2   Title Pt volume to be decreased by 2 cm to allow decrease weight in legs to allow pt to be able to lift legs onto her bed with increased ease.    Time 3   Period Weeks   Status New  PT  Long Term Goals - 04/20/17 1614      PT LONG TERM GOAL #1   Title Pt volume to be decreased by 4 cm to allow pt to don pants and shoes easier.    Time 6   Period Weeks   Status New   Target Date 05/18/17     PT LONG TERM GOAL #2   Title Pt to have recieved and be able to don compression garments    Time 6   Period Weeks   Status New     PT LONG TERM GOAL #3   Title Pt to have recieved an to be able to use a compression pump.   Time 6   Period Weeks   Status New                Plan - 04-20-17 1605    Clinical Impression Statement Kristina Howell is a 81 yo female who has had lymphedema for years.  She was wearing compression garments when her husband could assist her in donning them but he has not been able to assist her therefore she quit wearing them and ultimately began to experience increased swelling in both legs. The swelling is at a point now that she is only able to wear one pair of her shoes.  Her MD has referred her to skilled physical therapy for lymphedma treatment.  Kristina Howell will benefit from skilled physical therapy for total decongestive techniques to decrease her volume to reduce the risk of cellulitis as well as allow her to wear her normal clothing and foot wear.     History and Personal Factors relevant to plan of care: CAD, cardiomyopathy, DM renal insufficiencey   Clinical Presentation Evolving   Clinical Decision Making Moderate   Rehab Potential Good   PT Frequency 3x / week   PT Duration 6 weeks   PT Treatment/Interventions ADLs/Self Care Home Management;Compression bandaging;Manual lymph drainage;Patient/family education;Therapeutic exercise   PT Next Visit Plan cut foam for B LE and begin manual decongestive techniques B.  Begin toe to knee compression wrapping when bandages come in.  If pt is tolerating this it might be benefical to begin bandaging to thigh.     PT Home Exercise Plan Given to improve lymphatic circulation.       Patient will  benefit from skilled therapeutic intervention in order to improve the following deficits and impairments:  Decreased activity tolerance, Decreased balance, Obesity, Increased edema, Difficulty walking  Visit Diagnosis: Lymphedema, not elsewhere classified      G-Codes - Apr 20, 2017 1616    Functional Assessment Tool Used (Outpatient Only) life impact score   Functional Limitation Other PT primary   Other PT Primary Current Status (K4401) At least 40 percent but less than 60 percent impaired, limited or restricted   Other PT Primary Goal Status (U2725) At least 20 percent but less than 40 percent impaired, limited or restricted       Problem List Patient Active Problem List   Diagnosis Date Noted  . Femoral artery hematoma complicating cardiac catheterization 09/15/2016  . Status post coronary artery stent placement   . Difficulty in walking, not elsewhere classified   . Coronary artery disease involving native coronary artery of native heart with unstable angina pectoris ([place])   . Coronary artery disease due to lipid rich plaque   . Chronic anticoagulation   . Acute systolic heart failure ([place])   . Unstable angina ([place])   . Type 2 diabetes mellitus with  diabetic foot ulcer ([place]) 08/30/2016  . Acute kidney injury ([place]) 08/30/2016  . Dyspnea 08/30/2016  . Peripheral edema   . Diabetes mellitus ([place])   . Persistent atrial fibrillation ([place])   . Morbidly obese [facility])   [name], PT CLT [phone] 04/06/2017, 4:17 PM  [facility] [address] [place], [place], [address] Phone: [phone]   Fax:  [phone]  Name: Kristina Howell MRN: [id number] Date of Birth: 1935/08/20

## 2017-04-13 ENCOUNTER — Ambulatory Visit (HOSPITAL_COMMUNITY): Payer: PPO | Admitting: Physical Therapy

## 2017-04-13 ENCOUNTER — Telehealth (HOSPITAL_COMMUNITY): Payer: Self-pay | Admitting: Physical Therapy

## 2017-04-13 DIAGNOSIS — I890 Lymphedema, not elsewhere classified: Secondary | ICD-10-CM | POA: Diagnosis not present

## 2017-04-13 NOTE — Telephone Encounter (Signed)
Ms. Brawley comes today with her order from [facility].  STates she had issues with the code when she called in her order and part of her order was not right.  Contacted customer service, who could not help then called Williemae Natter [phone]).  we discussed issue with Mateo Flow in customer service and they are working on the issue. Teena Irani, PTA/CLT [phone]

## 2017-04-13 NOTE — Therapy (Signed)
[facility] [address] [place], [place], [address] Phone: [phone]   Fax:  [phone]  Physical Therapy Treatment  Patient Details  Name: Kristina Howell MRN: [id number] Date of Birth: 09-12-1935 Referring Provider: Rozann Lesches  Encounter Date: 04/13/2017      PT End of Session - 04/13/17 1751    Visit Number 2   Number of Visits 18   Date for PT Re-Evaluation 05/06/17   Authorization Type [employer]   Authorization - Visit Number 2   Authorization - Number of Visits 10   PT Start Time 8921   PT Stop Time 1550   PT Time Calculation (min) 77 min   Activity Tolerance Patient tolerated treatment well   Behavior During Therapy [facility] for tasks assessed/performed      Past Medical History:  Diagnosis Date  . Atrial fibrillation ([place])   . CAD (coronary artery disease)    Multivessel, DES to LAD and RCA 08/2016  . Cardiomyopathy (HCC)    LVEF 45%  . CKD (chronic kidney disease) stage 3, GFR 30-59 ml/min   . Essential hypertension   . Gout   . Pancreatitis, recurrent ([place])   . Type 2 diabetes mellitus ([place])     Past Surgical History:  Procedure Laterality Date  . CHOLECYSTECTOMY    . CORONARY STENT INTERVENTION N/A 09/08/2016   Procedure: Coronary Stent Intervention;  Surgeon: Belva Crome, MD;  Location: [facility];  Service: Cardiovascular;  Laterality: N/A;  . LEFT HEART CATH AND CORONARY ANGIOGRAPHY N/A 09/03/2016   Procedure: Left Heart Cath and Coronary Angiography;  Surgeon: Nelva Bush, MD;  Location: [facility];  Service: Cardiovascular;  Laterality: N/A;  . TONSILLECTOMY      There were no vitals filed for this visit.      Subjective Assessment - 04/13/17 1749    Subjective Pt returns today with short stretch bandages she ordered last week.  States she is ready to begin the bandaging.    Currently in Pain? No/denies                         [facility] Adult PT  Treatment/Exercise - 04/13/17 0001      Manual Therapy   Manual Therapy --  unable to complete this session due to time   Compression Bandaging to Rt LE only.  #9 netting, /2" foam, cotton and multilayer short stretch bandaging.                 PT Education - 04/13/17 1758    Education provided Yes   Education Details compression bandaging.  Educated to remove if has any symptoms of irritation or numbness   Person(s) Educated Patient   Methods Explanation   Comprehension Verbalized understanding          PT Short Term Goals - 04/06/17 1612      PT SHORT TERM GOAL #1   Title Pt to be aware of her increased risk for cellulitis and be able to verbalize the signs and symptoms and the need to obtain antibiotics.    Time 3   Period Weeks   Status New   Target Date 04/27/17     PT SHORT TERM GOAL #2   Title Pt volume to be decreased by 2 cm to allow decrease weight in legs to allow pt to be able to lift legs onto her bed with increased ease.    Time 3  Period Weeks   Status New           PT Long Term Goals - 04/06/17 1614      PT LONG TERM GOAL #1   Title Pt volume to be decreased by 4 cm to allow pt to don pants and shoes easier.    Time 6   Period Weeks   Status New   Target Date 05/18/17     PT LONG TERM GOAL #2   Title Pt to have recieved and be able to don compression garments    Time 6   Period Weeks   Status New     PT LONG TERM GOAL #3   Title Pt to have recieved an to be able to use a compression pump.   Time 6   Period Weeks   Status New               Plan - 04/13/17 1752    Clinical Impression Statement Ms. Daywalt comes today with her order from [employer].  STates she had issues with the code when she called in her order and part of her order was not right.  Contacted customer service, who could not help then called Williemae Natter [phone]).  we discussed issue with Mateo Flow in customer service and they are working on the  issue.  Cut foam for distal Bilateral LE's and improvised bandages to begin bandaging on Rt LE.  Pt instructed to remove bandages Friday, as she does not have a return appt until next Monday.  Pt also instructed to remove if had any symptoms, numbness or unable to tolerate.  Pt verbalized understanding.    Rehab Potential Good   PT Frequency 3x / week   PT Duration 6 weeks   PT Treatment/Interventions ADLs/Self Care Home Management;Compression bandaging;Manual lymph drainage;Patient/family education;Therapeutic exercise   PT Next Visit Plan Resume manual and complete bilateral bandaging next session.  Follow up on order with [employer].   PT Home Exercise Plan Given to improve lymphatic circulation.       Patient will benefit from skilled therapeutic intervention in order to improve the following deficits and impairments:  Decreased activity tolerance, Decreased balance, Obesity, Increased edema, Difficulty walking  Visit Diagnosis: Lymphedema, not elsewhere classified     Problem List Patient Active Problem List   Diagnosis Date Noted  . Femoral artery hematoma complicating cardiac catheterization 09/15/2016  . Status post coronary artery stent placement   . Difficulty in walking, not elsewhere classified   . Coronary artery disease involving native coronary artery of native heart with unstable angina pectoris ([place])   . Coronary artery disease due to lipid rich plaque   . Chronic anticoagulation   . Acute systolic heart failure ([place])   . Unstable angina ([place])   . Type 2 diabetes mellitus with diabetic foot ulcer ([place]) 08/30/2016  . Acute kidney injury ([place]) 08/30/2016  . Dyspnea 08/30/2016  . Peripheral edema   . Diabetes mellitus ([place])   . Persistent atrial fibrillation ([place])   . Morbidly obese [facility])    Teena Irani, PTA/CLT [phone]  Teena Irani 04/13/2017, 5:59 PM  [facility] [address] [place], [place],  [address] Phone: [phone]   Fax:  [phone]  Name: Kristina Howell MRN: [id number] Date of Birth: [birth year]

## 2017-04-18 ENCOUNTER — Ambulatory Visit (HOSPITAL_COMMUNITY): Payer: PPO | Attending: Cardiology | Admitting: Physical Therapy

## 2017-04-18 DIAGNOSIS — I890 Lymphedema, not elsewhere classified: Secondary | ICD-10-CM | POA: Insufficient documentation

## 2017-04-18 NOTE — Therapy (Addendum)
[facility] [address] [place], [place], [address] Phone: [phone]   Fax:  [phone]  Physical Therapy Treatment  Patient Details  Name: Kristina Howell MRN: [id number] Date of Birth: Dec 05, 1935 Referring Provider: Rozann Lesches  Encounter Date: 04/18/2017      PT End of Session - 04/18/17 1641    Visit Number 3   Number of Visits 18   Date for PT Re-Evaluation 05/06/17   Authorization Type [employer]   Authorization - Visit Number 3   Authorization - Number of Visits 10   PT Start Time 1350   PT Stop Time 1510   PT Time Calculation (min) 80 min   Activity Tolerance Patient tolerated treatment well   Behavior During Therapy [facility] for tasks assessed/performed      Past Medical History:  Diagnosis Date  . Atrial fibrillation ([place])   . CAD (coronary artery disease)    Multivessel, DES to LAD and RCA 08/2016  . Cardiomyopathy (HCC)    LVEF 45%  . CKD (chronic kidney disease) stage 3, GFR 30-59 ml/min   . Essential hypertension   . Gout   . Pancreatitis, recurrent ([place])   . Type 2 diabetes mellitus ([place])     Past Surgical History:  Procedure Laterality Date  . CHOLECYSTECTOMY    . CORONARY STENT INTERVENTION N/A 09/08/2016   Procedure: Coronary Stent Intervention;  Surgeon: Belva Crome, MD;  Location: [facility];  Service: Cardiovascular;  Laterality: N/A;  . LEFT HEART CATH AND CORONARY ANGIOGRAPHY N/A 09/03/2016   Procedure: Left Heart Cath and Coronary Angiography;  Surgeon: Nelva Bush, MD;  Location: [facility];  Service: Cardiovascular;  Laterality: N/A;  . TONSILLECTOMY      There were no vitals filed for this visit.      Subjective Assessment - 04/18/17 1653    Subjective Pt states she is doing well and reports overall comfort and noted compression in the bandages.    Currently in Pain? No/denies                         [facility] Adult PT Treatment/Exercise -  04/18/17 0001      Manual Therapy   Manual Therapy Manual Lymphatic Drainage (MLD)   Manual therapy comments done seperate from all other aspects of treatment    Manual Lymphatic Drainage (MLD) supraclavicular, deep and superfical abdominal followed by LT inguinal axillary anastomsis and Lt LE.  Time restraints prevented Rt from being completed.    Compression Bandaging to Bil LE only.  #9 netting, 1/2" foam, cotton and multilayer short stretch bandaging.                 PT Education - 04/18/17 1652    Education provided Yes   Education Details different types of compression.     Person(s) Educated Patient   Methods Explanation;Demonstration   Comprehension Verbalized understanding          PT Short Term Goals - 04/06/17 1612      PT SHORT TERM GOAL #1   Title Pt to be aware of her increased risk for cellulitis and be able to verbalize the signs and symptoms and the need to obtain antibiotics.    Time 3   Period Weeks   Status New   Target Date 04/27/17     PT SHORT TERM GOAL #2   Title Pt volume to be decreased by 2 cm to  allow decrease weight in legs to allow pt to be able to lift legs onto her bed with increased ease.    Time 3   Period Weeks   Status New           PT Long Term Goals - 04/06/17 1614      PT LONG TERM GOAL #1   Title Pt volume to be decreased by 4 cm to allow pt to don pants and shoes easier.    Time 6   Period Weeks   Status New   Target Date 05/18/17     PT LONG TERM GOAL #2   Title Pt to have recieved and be able to don compression garments    Time 6   Period Weeks   Status New     PT LONG TERM GOAL #3   Title Pt to have recieved an to be able to use a compression pump.   Time 6   Period Weeks   Status New               Plan - 04/18/17 1642    Clinical Impression Statement Pt with positive reports with bandaging on Rt LE and noted reduction in edema.  Continued with manual lymph draiange for bilateral LE's.   Discussed compression garments regarding farrow vs juxtafit vs standard garment.  PT brough in her OTC knee high garment which appears to be less than 79mmHg.  Explained this is not enough compression and pt verbalized understanding. Distributed bandages (pt is missing 2 6cm) to allow bandaging to both LE's.  Used 1 24m and 4 10cm on each LE with good comfort and compression obtained.    Rehab Potential Good   PT Frequency 3x / week   PT Duration 6 weeks   PT Treatment/Interventions ADLs/Self Care Home Management;Compression bandaging;Manual lymph drainage;Patient/family education;Therapeutic exercise   PT Next Visit Plan Continue manual and compression bandaging to bilateral LE.  Follow up on remaining bandage order with [facility] [employer].  Measure next session (usually Wed but only with 2 appt this week).   PT Home Exercise Plan Given to improve lymphatic circulation.       Patient will benefit from skilled therapeutic intervention in order to improve the following deficits and impairments:  Decreased activity tolerance, Decreased balance, Obesity, Increased edema, Difficulty walking  Visit Diagnosis: Lymphedema, not elsewhere classified     Problem List Patient Active Problem List   Diagnosis Date Noted  . Femoral artery hematoma complicating cardiac catheterization 09/15/2016  . Status post coronary artery stent placement   . Difficulty in walking, not elsewhere classified   . Coronary artery disease involving native coronary artery of native heart with unstable angina pectoris ([place])   . Coronary artery disease due to lipid rich plaque   . Chronic anticoagulation   . Acute systolic heart failure ([place])   . Unstable angina ([place])   . Type 2 diabetes mellitus with diabetic foot ulcer ([place]) 08/30/2016  . Acute kidney injury ([place]) 08/30/2016  . Dyspnea 08/30/2016  . Peripheral edema   . Diabetes mellitus ([place])   . Persistent atrial fibrillation ([place])   . Morbidly obese [facility])    Teena Irani, PTA/CLT [phone]  Teena Irani 04/18/2017, 4:58 PM  [facility] [address] [place], [place], [address] Phone: [phone]   Fax:  [phone]  Name: Kristina Howell MRN: [id number] Date of Birth: February 25, 1936

## 2017-04-19 ENCOUNTER — Other Ambulatory Visit: Payer: Self-pay | Admitting: Cardiology

## 2017-04-22 ENCOUNTER — Other Ambulatory Visit: Payer: Self-pay | Admitting: Cardiology

## 2017-04-22 ENCOUNTER — Ambulatory Visit (HOSPITAL_COMMUNITY): Payer: PPO | Admitting: Physical Therapy

## 2017-04-22 DIAGNOSIS — I890 Lymphedema, not elsewhere classified: Secondary | ICD-10-CM | POA: Diagnosis not present

## 2017-04-22 NOTE — Therapy (Signed)
[facility] [address] [place], [place], [address] Phone: [phone]   Fax:  [phone]  Physical Therapy Treatment  Patient Details  Name: Kristina Howell MRN: [id number] Date of Birth: 17-Nov-1935 Referring Provider: Rozann Lesches  Encounter Date: 04/22/2017      PT End of Session - 04/22/17 1031    Visit Number 4   Number of Visits 18   Date for PT Re-Evaluation 05/06/17   Authorization Type [employer]   Authorization - Visit Number 4   Authorization - Number of Visits 10   PT Start Time 0905   PT Stop Time 1028   PT Time Calculation (min) 83 min   Activity Tolerance Patient tolerated treatment well   Behavior During Therapy [facility] for tasks assessed/performed      Past Medical History:  Diagnosis Date  . Atrial fibrillation ([place])   . CAD (coronary artery disease)    Multivessel, DES to LAD and RCA 08/2016  . Cardiomyopathy (HCC)    LVEF 45%  . CKD (chronic kidney disease) stage 3, GFR 30-59 ml/min   . Essential hypertension   . Gout   . Pancreatitis, recurrent ([place])   . Type 2 diabetes mellitus ([place])     Past Surgical History:  Procedure Laterality Date  . CHOLECYSTECTOMY    . CORONARY STENT INTERVENTION N/A 09/08/2016   Procedure: Coronary Stent Intervention;  Surgeon: Belva Crome, MD;  Location: [place] [facility];  Service: Cardiovascular;  Laterality: N/A;  . LEFT HEART CATH AND CORONARY ANGIOGRAPHY N/A 09/03/2016   Procedure: Left Heart Cath and Coronary Angiography;  Surgeon: Nelva Bush, MD;  Location: [place] [facility];  Service: Cardiovascular;  Laterality: N/A;  . TONSILLECTOMY      There were no vitals filed for this visit.      Subjective Assessment - 04/22/17 1029    Subjective Pt states that her legs are smaller than they have been in years.  Pt is pleased with progress.   Pertinent History Renal insufficiency, CAD, HT, DM    Currently in Pain? No/denies                LYMPHEDEMA/ONCOLOGY QUESTIONNAIRE - 04/22/17 0906      Right Lower Extremity Lymphedema   At Midpatella/Popliteal Crease (P)  54 cm   30 cm Proximal to Floor at Lateral Plantar Foot (P)  50 cm   20 cm Proximal to Floor at Lateral Plantar Foot (P)  38.1 1   10  cm Proximal to Floor at Lateral Malleoli (P)  31.7 cm   Circumference of ankle/heel (P)  35.3 cm.   5 cm Proximal to 1st MTP Joint (P)  23.7 cm   Across MTP Joint (P)  23 cm   Around Proximal Great Toe (P)  8.3 cm     Left Lower Extremity Lymphedema   At Midpatella/Popliteal Crease (P)  55 cm   30 cm Proximal to Floor at Lateral Plantar Foot (P)  44.5 cm   20 cm Proximal to Floor at Lateral Plantar Foot (P)  34.6 cm   10 cm Proximal to Floor at Lateral Malleoli (P)  30.7 cm   Circumference of ankle/heel (P)  35.8 cm.   5 cm Proximal to 1st MTP Joint (P)  23.2 cm   Across MTP Joint (P)  23 cm   Around Proximal Great Toe (P)  8 cm  [facility] Adult PT Treatment/Exercise - 04/22/17 0001      Manual Therapy   Manual Therapy Manual Lymphatic Drainage (MLD)   Manual therapy comments done seperate from all other aspects of treatment    Manual Lymphatic Drainage (MLD) supraclavicular, deep and superfical abdominal followed by routing fluid using  inguinal axillary anastomsis B; and B LE   Compression Bandaging to Bil LE only.  #9 netting, 1/2" foam, cotton and multilayer short stretch bandaging.                   PT Short Term Goals - 04/22/17 1034      PT SHORT TERM GOAL #1   Title Pt to be aware of her increased risk for cellulitis and be able to verbalize the signs and symptoms and the need to obtain antibiotics.    Time 3   Period Weeks   Status On-going     PT SHORT TERM GOAL #2   Title Pt volume to be decreased by 2 cm to allow decrease weight in legs to allow pt to be able to lift legs onto her bed with increased ease.    Time 3   Period Weeks   Status On-going           PT Long  Term Goals - 04/22/17 1035      PT LONG TERM GOAL #1   Title Pt volume to be decreased by 4 cm to allow pt to don pants and shoes easier.    Time 6   Period Weeks   Status On-going     PT LONG TERM GOAL #2   Title Pt to have recieved and be able to don compression garments    Time 6   Period Weeks   Status On-going     PT LONG TERM GOAL #3   Title Pt to have recieved an to be able to use a compression pump.   Time 6   Period Weeks   Status On-going               Plan - 04/22/17 1032    Clinical Impression Statement Pt and therapist discussed the need for pt to be either wearing biker compression shorts or assets or the possibilty of bandaging to thigh high.  Pt states she is going to try wearing her girdle which extends to the thighs.  Pt volumes measured with significant reduction of fluid.  Therapist added 2 10 cm bandages for proper bandage sequence as pt has not heard from [employer] at this time.    Rehab Potential Good   PT Frequency 3x / week   PT Duration 6 weeks   PT Treatment/Interventions ADLs/Self Care Home Management;Compression bandaging;Manual lymph drainage;Patient/family education;Therapeutic exercise   PT Next Visit Plan Continue manual and compression bandaging to bilateral LE.     PT Home Exercise Plan Given to improve lymphatic circulation.       Patient will benefit from skilled therapeutic intervention in order to improve the following deficits and impairments:  Decreased activity tolerance, Decreased balance, Obesity, Increased edema, Difficulty walking  Visit Diagnosis: Lymphedema, not elsewhere classified     Problem List Patient Active Problem List   Diagnosis Date Noted  . Femoral artery hematoma complicating cardiac catheterization 09/15/2016  . Status post coronary artery stent placement   . Difficulty in walking, not elsewhere classified   . Coronary artery disease involving native coronary artery of native heart  with unstable angina pectoris ([place])   .  Coronary artery disease due to lipid rich plaque   . Chronic anticoagulation   . Acute systolic heart failure ([place])   . Unstable angina ([place])   . Type 2 diabetes mellitus with diabetic foot ulcer ([place]) 08/30/2016  . Acute kidney injury ([place]) 08/30/2016  . Dyspnea 08/30/2016  . Peripheral edema   . Diabetes mellitus ([place])   . Persistent atrial fibrillation ([place])   . Morbidly obese [facility])     [name], PT CLT [phone] 04/22/2017, 10:35 AM  [facility] [address] [place], [place], [address] Phone: [phone]   Fax:  [phone]  Name: Eurika Sandy MRN: [id number] Date of Birth: [birth year]

## 2017-04-25 ENCOUNTER — Ambulatory Visit (HOSPITAL_COMMUNITY): Payer: PPO | Admitting: Physical Therapy

## 2017-04-25 DIAGNOSIS — I890 Lymphedema, not elsewhere classified: Secondary | ICD-10-CM

## 2017-04-25 NOTE — Therapy (Signed)
[facility] [place], [place], [address] Phone: [phone]   Fax:  [phone]  Physical Therapy Treatment  Patient Details  Name: Kristina Howell MRN: [id number] Date of Birth: 1936/01/02 Referring Provider: Rozann Lesches  Encounter Date: 04/25/2017      PT End of Session - 04/25/17 1516    Visit Number 5   Number of Visits 18   Date for PT Re-Evaluation 05/06/17   Authorization Type [employer]   Authorization - Visit Number 5   Authorization - Number of Visits 10   PT Start Time 6962   PT Stop Time 1507   PT Time Calculation (min) 72 min   Activity Tolerance Patient tolerated treatment well   Behavior During Therapy [facility] for tasks assessed/performed      Past Medical History:  Diagnosis Date  . Atrial fibrillation ([place])   . CAD (coronary artery disease)    Multivessel, DES to LAD and RCA 08/2016  . Cardiomyopathy (HCC)    LVEF 45%  . CKD (chronic kidney disease) stage 3, GFR 30-59 ml/min   . Essential hypertension   . Gout   . Pancreatitis, recurrent ([place])   . Type 2 diabetes mellitus ([place])     Past Surgical History:  Procedure Laterality Date  . CHOLECYSTECTOMY    . CORONARY STENT INTERVENTION N/A 09/08/2016   Procedure: Coronary Stent Intervention;  Surgeon: Belva Crome, MD;  Location: [place] [facility];  Service: Cardiovascular;  Laterality: N/A;  . LEFT HEART CATH AND CORONARY ANGIOGRAPHY N/A 09/03/2016   Procedure: Left Heart Cath and Coronary Angiography;  Surgeon: Nelva Bush, MD;  Location: [place] [facility] [facility];  Service: Cardiovascular;  Laterality: N/A;  . TONSILLECTOMY      There were no vitals filed for this visit.      Subjective Assessment - 04/25/17 1514    Subjective Pt comes with her girdle donned however now that her legs have gone down the girdle is loose in the thigh area.  Pt also vocalized concern of her second toe on the right side which has a redden area at the base  of the toe.  Toes are not being wrapped.     Pertinent History Renal insufficiency, CAD, HT, DM                          [facility] Adult PT Treatment/Exercise - 04/25/17 0001      Manual Therapy   Manual Therapy Manual Lymphatic Drainage (MLD)   Manual therapy comments done seperate from all other aspects of treatment    Manual Lymphatic Drainage (MLD) supraclavicular, deep and superfical abdominal followed by routing fluid using  inguinal axillary anastomsis B; and B LE   Compression Bandaging to Bil LE only.  #9 netting, 1/2" foam, cotton and multilayer short stretch bandaging.                   PT Short Term Goals - 04/22/17 1034      PT SHORT TERM GOAL #1   Title Pt to be aware of her increased risk for cellulitis and be able to verbalize the signs and symptoms and the need to obtain antibiotics.    Time 3   Period Weeks   Status On-going     PT SHORT TERM GOAL #2   Title Pt volume to be decreased by 2 cm to allow decrease weight in legs to allow pt to be able  to lift legs onto her bed with increased ease.    Time 3   Period Weeks   Status On-going           PT Long Term Goals - 04/22/17 1035      PT LONG TERM GOAL #1   Title Pt volume to be decreased by 4 cm to allow pt to don pants and shoes easier.    Time 6   Period Weeks   Status On-going     PT LONG TERM GOAL #2   Title Pt to have recieved and be able to don compression garments    Time 6   Period Weeks   Status On-going     PT LONG TERM GOAL #3   Title Pt to have recieved an to be able to use a compression pump.   Time 6   Period Weeks   Status On-going               Plan - 04/25/17 1517    Clinical Impression Statement Therapist vocalized the need to but neosporin on pt toe and if this does not clear up the irritation the possibility of needing an antibiotic.  Pt is diabetic therefore we will keep an eye on her toe.  Therapist explained that since the legs of her girdle  was stretched out when her legs were bigger it will be ineffective for her lymphedema pt vocalized understanding the need to acquire something that is going to be tight in her thigh area.  Pt received a package this weekend from [employer] which once again was the wrong items.  Amy Mare Ferrari took a picture and e-mailed it to show the company representative that they had once again mailed the wrong item to the patient.    Rehab Potential Good   PT Frequency 3x / week   PT Duration 6 weeks   PT Treatment/Interventions ADLs/Self Care Home Management;Compression bandaging;Manual lymph drainage;Patient/family education;Therapeutic exercise   PT Next Visit Plan Continue manual and compression bandaging to bilateral LE.     PT Home Exercise Plan Given to improve lymphatic circulation.       Patient will benefit from skilled therapeutic intervention in order to improve the following deficits and impairments:  Decreased activity tolerance, Decreased balance, Obesity, Increased edema, Difficulty walking  Visit Diagnosis: Lymphedema, not elsewhere classified     Problem List Patient Active Problem List   Diagnosis Date Noted  . Femoral artery hematoma complicating cardiac catheterization 09/15/2016  . Status post coronary artery stent placement   . Difficulty in walking, not elsewhere classified   . Coronary artery disease involving native coronary artery of native heart with unstable angina pectoris ([place])   . Coronary artery disease due to lipid rich plaque   . Chronic anticoagulation   . Acute systolic heart failure ([place])   . Unstable angina ([place])   . Type 2 diabetes mellitus with diabetic foot ulcer ([place]) 08/30/2016  . Acute kidney injury ([place]) 08/30/2016  . Dyspnea 08/30/2016  . Peripheral edema   . Diabetes mellitus ([place])   . Persistent atrial fibrillation ([place])   . Morbidly obese [facility])     [name], PT CLT [phone] 04/25/2017, 3:20 PM  [facility] [address] [place], [place], [address] Phone: [phone]   Fax:  [phone]  Name: Kristina Howell MRN: [id number] Date of Birth: April 04, 1936

## 2017-04-27 ENCOUNTER — Ambulatory Visit (HOSPITAL_COMMUNITY): Payer: PPO | Admitting: Physical Therapy

## 2017-04-27 DIAGNOSIS — I890 Lymphedema, not elsewhere classified: Secondary | ICD-10-CM | POA: Diagnosis not present

## 2017-04-27 NOTE — Therapy (Signed)
[facility] [place], [place], [address] Phone: [phone]   Fax:  [phone]  Physical Therapy Treatment  Patient Details  Name: Kristina Howell MRN: [id number] Date of Birth: 22-Feb-1936 Referring Provider: Rozann Lesches  Encounter Date: 04/27/2017      PT End of Session - 04/27/17 1510    Visit Number 6   Number of Visits 18   Date for PT Re-Evaluation 05/06/17   Authorization Type [employer]   Authorization - Visit Number 6   Authorization - Number of Visits 10   PT Start Time 4580   PT Stop Time 1507   PT Time Calculation (min) 82 min   Activity Tolerance Patient tolerated treatment well   Behavior During Therapy [facility] for tasks assessed/performed      Past Medical History:  Diagnosis Date  . Atrial fibrillation ([place])   . CAD (coronary artery disease)    Multivessel, DES to LAD and RCA 08/2016  . Cardiomyopathy (HCC)    LVEF 45%  . CKD (chronic kidney disease) stage 3, GFR 30-59 ml/min   . Essential hypertension   . Gout   . Pancreatitis, recurrent ([place])   . Type 2 diabetes mellitus ([place])     Past Surgical History:  Procedure Laterality Date  . CHOLECYSTECTOMY    . CORONARY STENT INTERVENTION N/A 09/08/2016   Procedure: Coronary Stent Intervention;  Surgeon: Belva Crome, MD;  Location: [facility];  Service: Cardiovascular;  Laterality: N/A;  . LEFT HEART CATH AND CORONARY ANGIOGRAPHY N/A 09/03/2016   Procedure: Left Heart Cath and Coronary Angiography;  Surgeon: Nelva Bush, MD;  Location: [facility];  Service: Cardiovascular;  Laterality: N/A;  . TONSILLECTOMY      There were no vitals filed for this visit.      Subjective Assessment - 04/27/17 1509    Subjective Pt believes that the fluid in her knees is going down with her manual.     Pertinent History Renal insufficiency, CAD, HT, DM    Currently in Pain? No/denies               LYMPHEDEMA/ONCOLOGY  QUESTIONNAIRE - 04/27/17 1348      Right Lower Extremity Lymphedema   At Midpatella/Popliteal Crease (P)  52 cm   30 cm Proximal to Floor at Lateral Plantar Foot (P)  47.8 cm   20 cm Proximal to Floor at Lateral Plantar Foot (P)  32.6 1   10  cm Proximal to Floor at Lateral Malleoli (P)  29.3 cm   Circumference of ankle/heel (P)  33.7 cm.   5 cm Proximal to 1st MTP Joint (P)  23.7 cm   Across MTP Joint (P)  23.2 cm   Around Proximal Great Toe (P)  8.4 cm     Left Lower Extremity Lymphedema   At Midpatella/Popliteal Crease (P)  51.9 cm   30 cm Proximal to Floor at Lateral Plantar Foot (P)  46.5 cm   20 cm Proximal to Floor at Lateral Plantar Foot (P)  31.3 cm   10 cm Proximal to Floor at Lateral Malleoli (P)  29.4 cm   Circumference of ankle/heel (P)  35.8 cm.   5 cm Proximal to 1st MTP Joint (P)  22.7 cm   Across MTP Joint (P)  23.2 cm   Around Proximal Great Toe (P)  8.3 cm                  [facility] Adult  PT Treatment/Exercise - 04/27/17 0001      Manual Therapy   Manual Therapy Manual Lymphatic Drainage (MLD)   Manual therapy comments done seperate from all other aspects of treatment    Manual Lymphatic Drainage (MLD) supraclavicular, deep and superfical abdominal followed by routing fluid using  inguinal axillary anastomsis B; and B LE   Compression Bandaging to Bil LE only.  #7 netting, 1/2" foam, cotton and multilayer short stretch bandaging.                   PT Short Term Goals - 04/27/17 1511      PT SHORT TERM GOAL #1   Title Pt to be aware of her increased risk for cellulitis and be able to verbalize the signs and symptoms and the need to obtain antibiotics.    Time 3   Period Weeks   Status Achieved     PT SHORT TERM GOAL #2   Title Pt volume to be decreased by 2 cm to allow decrease weight in legs to allow pt to be able to lift legs onto her bed with increased ease.    Time 3   Period Weeks   Status Achieved           PT Long Term Goals  - 04/27/17 1511      PT LONG TERM GOAL #1   Title Pt volume to be decreased by 4 cm to allow pt to don pants and shoes easier.    Time 6   Period Weeks   Status On-going     PT LONG TERM GOAL #2   Title Pt to have recieved and be able to don compression garments    Time 6   Period Weeks   Status On-going     PT LONG TERM GOAL #3   Title Pt to have recieved an to be able to use a compression pump.   Time 6   Period Weeks   Status On-going               Plan - 04/27/17 1510    Clinical Impression Statement Pt remeasured with continued decreased in volume.  Pt urged to keep wearing her girdle as she did not have it on today.    Rehab Potential Good   PT Frequency 3x / week   PT Duration 6 weeks   PT Treatment/Interventions ADLs/Self Care Home Management;Compression bandaging;Manual lymph drainage;Patient/family education;Therapeutic exercise   PT Next Visit Plan Continue manual and compression bandaging to bilateral LE.  Measure on Wednesdays.    PT Home Exercise Plan Given to improve lymphatic circulation.       Patient will benefit from skilled therapeutic intervention in order to improve the following deficits and impairments:  Decreased activity tolerance, Decreased balance, Obesity, Increased edema, Difficulty walking  Visit Diagnosis: Lymphedema, not elsewhere classified     Problem List Patient Active Problem List   Diagnosis Date Noted  . Femoral artery hematoma complicating cardiac catheterization 09/15/2016  . Status post coronary artery stent placement   . Difficulty in walking, not elsewhere classified   . Coronary artery disease involving native coronary artery of native heart with unstable angina pectoris ([place])   . Coronary artery disease due to lipid rich plaque   . Chronic anticoagulation   . Acute systolic heart failure ([place])   . Unstable angina ([place])   . Type 2 diabetes mellitus with diabetic foot ulcer ([place]) 08/30/2016  . Acute kidney injury  ([place]) 08/30/2016  .  Dyspnea 08/30/2016  . Peripheral edema   . Diabetes mellitus ([place])   . Persistent atrial fibrillation ([place])   . Morbidly obese [facility])     [name], PT CLT [phone] 04/27/2017, 3:12 PM  [facility] [address] [place], [place], [address] Phone: [phone]   Fax:  [phone]  Name: Mc Hollen MRN: [id number] Date of Birth: July 19, 1936

## 2017-04-29 ENCOUNTER — Ambulatory Visit (HOSPITAL_COMMUNITY): Payer: PPO | Admitting: Physical Therapy

## 2017-04-29 DIAGNOSIS — I890 Lymphedema, not elsewhere classified: Secondary | ICD-10-CM | POA: Diagnosis not present

## 2017-04-29 NOTE — Therapy (Signed)
[facility] [place], [place], [address] Phone: [phone]   Fax:  [phone]  Physical Therapy Treatment  Patient Details  Name: Kristina Howell MRN: [id number] Date of Birth: Jun 11, 1936 Referring Provider: Rozann Lesches  Encounter Date: 04/29/2017      PT End of Session - 04/29/17 1338    Visit Number 7   Number of Visits 18   Date for PT Re-Evaluation 05/06/17   Authorization Type [employer]   Authorization - Visit Number 7   Authorization - Number of Visits 10   PT Start Time 1340   PT Stop Time 1505   PT Time Calculation (min) 85 min   Activity Tolerance Patient tolerated treatment well   Behavior During Therapy [facility] for tasks assessed/performed      Past Medical History:  Diagnosis Date  . Atrial fibrillation ([facility])   . CAD (coronary artery disease)    Multivessel, DES to LAD and RCA 08/2016  . Cardiomyopathy (HCC)    LVEF 45%  . CKD (chronic kidney disease) stage 3, GFR 30-59 ml/min   . Essential hypertension   . Gout   . Pancreatitis, recurrent ([place])   . Type 2 diabetes mellitus ([place])     Past Surgical History:  Procedure Laterality Date  . CHOLECYSTECTOMY    . CORONARY STENT INTERVENTION N/A 09/08/2016   Procedure: Coronary Stent Intervention;  Surgeon: Belva Crome, MD;  Location: [facility] [facility];  Service: Cardiovascular;  Laterality: N/A;  . LEFT HEART CATH AND CORONARY ANGIOGRAPHY N/A 09/03/2016   Procedure: Left Heart Cath and Coronary Angiography;  Surgeon: Nelva Bush, MD;  Location: [place] [facility];  Service: Cardiovascular;  Laterality: N/A;  . TONSILLECTOMY      There were no vitals filed for this visit.      Subjective Assessment - 04/29/17 1337    Subjective Pt voices no complaints.  She has been self massaging her knees more.    Pertinent History Renal insufficiency, CAD, HT, DM                          [facility] Adult PT Treatment/Exercise -  04/29/17 0001      Manual Therapy   Manual Therapy Manual Lymphatic Drainage (MLD)   Manual therapy comments done seperate from all other aspects of treatment    Manual Lymphatic Drainage (MLD) supraclavicular, deep and superfical abdominal followed by routing fluid using  inguinal axillary anastomsis B; and B LE   Compression Bandaging to Bil LE only.  #9 netting, 1/2" foam, cotton and multilayer short stretch bandaging.                   PT Short Term Goals - 04/27/17 1511      PT SHORT TERM GOAL #1   Title Pt to be aware of her increased risk for cellulitis and be able to verbalize the signs and symptoms and the need to obtain antibiotics.    Time 3   Period Weeks   Status Achieved     PT SHORT TERM GOAL #2   Title Pt volume to be decreased by 2 cm to allow decrease weight in legs to allow pt to be able to lift legs onto her bed with increased ease.    Time 3   Period Weeks   Status Achieved           PT Long Term Goals - 04/27/17 1511  PT LONG TERM GOAL #1   Title Pt volume to be decreased by 4 cm to allow pt to don pants and shoes easier.    Time 6   Period Weeks   Status On-going     PT LONG TERM GOAL #2   Title Pt to have recieved and be able to don compression garments    Time 6   Period Weeks   Status On-going     PT LONG TERM GOAL #3   Title Pt to have recieved an to be able to use a compression pump.   Time 6   Period Weeks   Status On-going               Plan - 04/29/17 1339    Clinical Impression Statement Reviewed self manual techinques with pt with pt demonstrating proper technique.     Rehab Potential Good   PT Frequency 3x / week   PT Duration 6 weeks   PT Treatment/Interventions ADLs/Self Care Home Management;Compression bandaging;Manual lymph drainage;Patient/family education;Therapeutic exercise   PT Next Visit Plan Continue manual and compression bandaging to bilateral LE.  Measure on Wednesdays.    PT Home Exercise  Plan Given to improve lymphatic circulation.       Patient will benefit from skilled therapeutic intervention in order to improve the following deficits and impairments:  Decreased activity tolerance, Decreased balance, Obesity, Increased edema, Difficulty walking  Visit Diagnosis: Lymphedema, not elsewhere classified     Problem List Patient Active Problem List   Diagnosis Date Noted  . Femoral artery hematoma complicating cardiac catheterization 09/15/2016  . Status post coronary artery stent placement   . Difficulty in walking, not elsewhere classified   . Coronary artery disease involving native coronary artery of native heart with unstable angina pectoris ([place])   . Coronary artery disease due to lipid rich plaque   . Chronic anticoagulation   . Acute systolic heart failure ([place])   . Unstable angina ([place])   . Type 2 diabetes mellitus with diabetic foot ulcer ([place]) 08/30/2016  . Acute kidney injury ([place]) 08/30/2016  . Dyspnea 08/30/2016  . Peripheral edema   . Diabetes mellitus ([place])   . Persistent atrial fibrillation ([place])   . Morbidly obese [facility])     [name], PT CLT [phone] 04/29/2017, 3:05 PM  [facility] [address] [place], [place], [address] Phone: [phone]   Fax:  [phone]  Name: Kristina Howell MRN: [id number] Date of Birth: 27-Jul-1935

## 2017-05-02 ENCOUNTER — Ambulatory Visit (HOSPITAL_COMMUNITY): Payer: PPO | Admitting: Physical Therapy

## 2017-05-02 ENCOUNTER — Other Ambulatory Visit: Payer: Self-pay | Admitting: Cardiology

## 2017-05-02 DIAGNOSIS — I890 Lymphedema, not elsewhere classified: Secondary | ICD-10-CM | POA: Diagnosis not present

## 2017-05-02 NOTE — Therapy (Signed)
[facility] [place], [place], [address] Phone: [phone]   Fax:  [phone]  Physical Therapy Treatment  Patient Details  Name: Kristina Howell MRN: [id number] Date of Birth: 20-Aug-1935 Referring Provider: Rozann Lesches  Encounter Date: 05/02/2017      PT End of Session - 05/02/17 1513    Visit Number 8   Number of Visits 18   Date for PT Re-Evaluation 05/06/17   Authorization Type [employer]   Authorization - Visit Number 8   Authorization - Number of Visits 10   PT Start Time 0962   PT Stop Time 1510   PT Time Calculation (min) 75 min   Activity Tolerance Patient tolerated treatment well   Behavior During Therapy [facility] for tasks assessed/performed      Past Medical History:  Diagnosis Date  . Atrial fibrillation ([place])   . CAD (coronary artery disease)    Multivessel, DES to LAD and RCA 08/2016  . Cardiomyopathy (HCC)    LVEF 45%  . CKD (chronic kidney disease) stage 3, GFR 30-59 ml/min   . Essential hypertension   . Gout   . Pancreatitis, recurrent ([place])   . Type 2 diabetes mellitus ([place])     Past Surgical History:  Procedure Laterality Date  . CHOLECYSTECTOMY    . CORONARY STENT INTERVENTION N/A 09/08/2016   Procedure: Coronary Stent Intervention;  Surgeon: Belva Crome, MD;  Location: [place] CV [facility];  Service: Cardiovascular;  Laterality: N/A;  . LEFT HEART CATH AND CORONARY ANGIOGRAPHY N/A 09/03/2016   Procedure: Left Heart Cath and Coronary Angiography;  Surgeon: Nelva Bush, MD;  Location: [place] CV LAB;  Service: Cardiovascular;  Laterality: N/A;  . TONSILLECTOMY      There were no vitals filed for this visit.      Subjective Assessment - 05/02/17 1344    Subjective Pt has no complaints    Pertinent History Renal insufficiency, CAD, HT, DM    Currently in Pain? No/denies                         [facility] Adult PT Treatment/Exercise - 05/02/17 0001      Manual Therapy   Manual Therapy Manual Lymphatic Drainage (MLD)   Manual therapy comments done seperate from all other aspects of treatment    Manual Lymphatic Drainage (MLD) supraclavicular, deep and superfical abdominal followed by routing fluid using  inguinal axillary anastomsis B; and B LE   Compression Bandaging to Bil LE only.  #9 netting, 1/2" foam, cotton and multilayer short stretch bandaging.                   PT Short Term Goals - 04/27/17 1511      PT SHORT TERM GOAL #1   Title Pt to be aware of her increased risk for cellulitis and be able to verbalize the signs and symptoms and the need to obtain antibiotics.    Time 3   Period Weeks   Status Achieved     PT SHORT TERM GOAL #2   Title Pt volume to be decreased by 2 cm to allow decrease weight in legs to allow pt to be able to lift legs onto her bed with increased ease.    Time 3   Period Weeks   Status Achieved           PT Long Term Goals - 04/27/17 1511  PT LONG TERM GOAL #1   Title Pt volume to be decreased by 4 cm to allow pt to don pants and shoes easier.    Time 6   Period Weeks   Status On-going     PT LONG TERM GOAL #2   Title Pt to have recieved and be able to don compression garments    Time 6   Period Weeks   Status On-going     PT LONG TERM GOAL #3   Title Pt to have recieved an to be able to use a compression pump.   Time 6   Period Weeks   Status On-going               Plan - 05/02/17 1345         Assessment:                                                                      Pt with increased induration today.  She has been                                                                                                  Out of power and states that she has not been as                                                                                                   Active.    Rehab Potential (P)  Good   PT Frequency (P)  3x / week   PT Duration (P)  6 weeks    PT Treatment/Interventions (P)  ADLs/Self Care Home Management;Compression bandaging;Manual lymph drainage;Patient/family education;Therapeutic exercise   PT Next Visit Plan (P)  Continue manual and compression bandaging to bilateral LE.  Measure on Wednesdays.    PT Home Exercise Plan (P)  Given to improve lymphatic circulation.       Patient will benefit from skilled therapeutic intervention in order to improve the following deficits and impairments:  (P) Decreased activity tolerance, Decreased balance, Obesity, Increased edema, Difficulty walking  Visit Diagnosis: Lymphedema, not elsewhere classified     Problem List Patient Active Problem List   Diagnosis Date Noted  . Femoral artery hematoma complicating cardiac catheterization 09/15/2016  . Status post coronary artery stent placement   . Difficulty in walking, not elsewhere classified   . Coronary artery disease involving native coronary artery of native heart with unstable angina pectoris ([place])   .  Coronary artery disease due to lipid rich plaque   . Chronic anticoagulation   . Acute systolic heart failure ([place])   . Unstable angina ([place])   . Type 2 diabetes mellitus with diabetic foot ulcer ([place]) 08/30/2016  . Acute kidney injury ([place]) 08/30/2016  . Dyspnea 08/30/2016  . Peripheral edema   . Diabetes mellitus ([place])   . Persistent atrial fibrillation ([place])   . Morbidly obese [facility])      Kristina Howell, PT CLT [phone] 05/02/2017, 3:14 PM  [place] [address] [place], [place], [address] Phone: [phone]   Fax:  [phone]  Name: Kristina Howell MRN: [id number] Date of Birth: [birth year]

## 2017-05-04 ENCOUNTER — Ambulatory Visit (HOSPITAL_COMMUNITY): Payer: PPO | Admitting: Physical Therapy

## 2017-05-04 DIAGNOSIS — I890 Lymphedema, not elsewhere classified: Secondary | ICD-10-CM

## 2017-05-04 NOTE — Therapy (Signed)
[place] [address] [place], [place], [address] Phone: [phone]   Fax:  [phone]  Physical Therapy Treatment  Patient Details  Name: Kristina Howell MRN: [id number] Date of Birth: [birth year] Referring Provider: Rozann Lesches  Encounter Date: 05/04/2017      PT End of Session - 05/04/17 1633    Visit Number 9   Number of Visits 18   Date for PT Re-Evaluation 05/06/17   Authorization Type [employer]   Authorization - Visit Number 9   Authorization - Number of Visits 10   PT Start Time 1350   PT Stop Time 1515   PT Time Calculation (min) 85 min   Activity Tolerance Patient tolerated treatment well   Behavior During Therapy [facility] for tasks assessed/performed      Past Medical History:  Diagnosis Date  . Atrial fibrillation ([place])   . CAD (coronary artery disease)    Multivessel, DES to LAD and RCA 08/2016  . Cardiomyopathy (HCC)    LVEF 45%  . CKD (chronic kidney disease) stage 3, GFR 30-59 ml/min   . Essential hypertension   . Gout   . Pancreatitis, recurrent ([place])   . Type 2 diabetes mellitus ([place])     Past Surgical History:  Procedure Laterality Date  . CHOLECYSTECTOMY    . CORONARY STENT INTERVENTION N/A 09/08/2016   Procedure: Coronary Stent Intervention;  Surgeon: Belva Crome, MD;  Location: [place] CV LAB;  Service: Cardiovascular;  Laterality: N/A;  . LEFT HEART CATH AND CORONARY ANGIOGRAPHY N/A 09/03/2016   Procedure: Left Heart Cath and Coronary Angiography;  Surgeon: Nelva Bush, MD;  Location: [place] CV LAB;  Service: Cardiovascular;  Laterality: N/A;  . TONSILLECTOMY      There were no vitals filed for this visit.      Subjective Assessment - 05/04/17 1632    Subjective Pt states her legs feel a little more swollen today.  STates she removed her dressings last night.               LYMPHEDEMA/ONCOLOGY QUESTIONNAIRE - 05/04/17 1635      Right Lower Extremity  Lymphedema   At Midpatella/Popliteal Crease 52 cm   30 cm Proximal to Floor at Lateral Plantar Foot 46 cm   20 cm Proximal to Floor at Lateral Plantar Foot 33.2 1   10  cm Proximal to Floor at Lateral Malleoli 29.5 cm   Circumference of ankle/heel 35 cm.   5 cm Proximal to 1st MTP Joint 25.3 cm   Across MTP Joint 23.1 cm   Around Proximal Great Toe 8.4 cm     Left Lower Extremity Lymphedema   At Midpatella/Popliteal Crease 54.4 cm   30 cm Proximal to Floor at Lateral Plantar Foot 50 cm   20 cm Proximal to Floor at Lateral Plantar Foot 33.5 cm   10 cm Proximal to Floor at Lateral Malleoli 29.4 cm   Circumference of ankle/heel 35 cm.   5 cm Proximal to 1st MTP Joint 23.3 cm   Across MTP Joint 22 cm   Around Proximal Great Toe 8 cm                  [facility] Adult PT Treatment/Exercise - 05/04/17 0001      Manual Therapy   Manual Therapy Manual Lymphatic Drainage (MLD)   Manual therapy comments done seperate from all other aspects of treatment    Manual Lymphatic Drainage (MLD) supraclavicular, deep and superfical abdominal followed  by routing fluid using  inguinal axillary anastomsis B; and B LE   Compression Bandaging to Bil LE only.  #9 netting, 1/2" foam, cotton and multilayer short stretch bandaging.                   PT Short Term Goals - 04/27/17 1511      PT SHORT TERM GOAL #1   Title Pt to be aware of her increased risk for cellulitis and be able to verbalize the signs and symptoms and the need to obtain antibiotics.    Time 3   Period Weeks   Status Achieved     PT SHORT TERM GOAL #2   Title Pt volume to be decreased by 2 cm to allow decrease weight in legs to allow pt to be able to lift legs onto her bed with increased ease.    Time 3   Period Weeks   Status Achieved           PT Long Term Goals - 04/27/17 1511      PT LONG TERM GOAL #1   Title Pt volume to be decreased by 4 cm to allow pt to don pants and shoes easier.    Time 6   Period  Weeks   Status On-going     PT LONG TERM GOAL #2   Title Pt to have recieved and be able to don compression garments    Time 6   Period Weeks   Status On-going     PT LONG TERM GOAL #3   Title Pt to have recieved an to be able to use a compression pump.   Time 6   Period Weeks   Status On-going               Plan - 05/04/17 1634    Clinical Impression Statement LE's remeasured this session with noted reduction proximally Rt and and through the shin on Lt LE.  Some areas were up around the ankle region.  Pt overall demonstrating compliance with bandaging.  Encouraged to get more activity while wearing garments to encourage reduction.    Rehab Potential Good   PT Frequency 3x / week   PT Duration 6 weeks   PT Treatment/Interventions ADLs/Self Care Home Management;Compression bandaging;Manual lymph drainage;Patient/family education;Therapeutic exercise   PT Next Visit Plan Continue manual and compression bandaging to bilateral LE.  Measure on Wednesdays.    PT Home Exercise Plan Given to improve lymphatic circulation.       Patient will benefit from skilled therapeutic intervention in order to improve the following deficits and impairments:  Decreased activity tolerance, Decreased balance, Obesity, Increased edema, Difficulty walking  Visit Diagnosis: Lymphedema, not elsewhere classified     Problem List Patient Active Problem List   Diagnosis Date Noted  . Femoral artery hematoma complicating cardiac catheterization 09/15/2016  . Status post coronary artery stent placement   . Difficulty in walking, not elsewhere classified   . Coronary artery disease involving native coronary artery of native heart with unstable angina pectoris ([place])   . Coronary artery disease due to lipid rich plaque   . Chronic anticoagulation   . Acute systolic heart failure ([place])   . Unstable angina ([place])   . Type 2 diabetes mellitus with diabetic foot ulcer ([place]) 08/30/2016  . Acute kidney  injury ([place]) 08/30/2016  . Dyspnea 08/30/2016  . Peripheral edema   . Diabetes mellitus ([place])   . Persistent atrial fibrillation ([place])   .  Morbidly obese [facility])    Teena Irani, PTA/CLT [phone]  Teena Irani 05/04/2017, 4:38 PM  [place] [place] [address] [place], [place], [address] Phone: [phone]   Fax:  [phone]  Name: Kristina Howell MRN: [id number] Date of Birth: July 12, 1936

## 2017-05-05 ENCOUNTER — Encounter (HOSPITAL_COMMUNITY): Payer: PPO | Admitting: Physical Therapy

## 2017-05-09 ENCOUNTER — Ambulatory Visit (HOSPITAL_COMMUNITY): Payer: PPO | Admitting: Physical Therapy

## 2017-05-09 DIAGNOSIS — I890 Lymphedema, not elsewhere classified: Secondary | ICD-10-CM | POA: Diagnosis not present

## 2017-05-09 NOTE — Therapy (Signed)
[place], [place], [address] Phone: [phone]   Fax:  [phone]  Physical Therapy Treatment  Patient Details  Name: Kristina Howell [place] MRN: [id number] Date of Birth: 12-26-1935 Referring Provider: Rozann Lesches  Encounter Date: 05/09/2017      PT End of Session - 05/09/17 1548    Visit Number 10   Number of Visits 18   Date for PT Re-Evaluation 05/06/17   Authorization Type [employer]   Authorization - Visit Number 10   Authorization - Number of Visits 10   PT Start Time 1440   PT Stop Time 1542   PT Time Calculation (min) 62 min   Activity Tolerance Patient tolerated treatment well   Behavior During Therapy [facility] for tasks assessed/performed      Past Medical History:  Diagnosis Date  . Atrial fibrillation ([place])   . CAD (coronary artery disease)    Multivessel, DES to LAD and RCA 08/2016  . Cardiomyopathy (HCC)    LVEF 45%  . CKD (chronic kidney disease) stage 3, GFR 30-59 ml/min   . Essential hypertension   . Gout   . Pancreatitis, recurrent ([place])   . Type 2 diabetes mellitus ([place])     Past Surgical History:  Procedure Laterality Date  . CHOLECYSTECTOMY    . CORONARY STENT INTERVENTION N/A 09/08/2016   Procedure: Coronary Stent Intervention;  Surgeon: Belva Crome, MD;  Location: [place] CV LAB;  Service: Cardiovascular;  Laterality: N/A;  . LEFT HEART CATH AND CORONARY ANGIOGRAPHY N/A 09/03/2016   Procedure: Left Heart Cath and Coronary Angiography;  Surgeon: Nelva Bush, MD;  Location: [place] CV LAB;  Service: Cardiovascular;  Laterality: N/A;  . TONSILLECTOMY      There were no vitals filed for this visit.      Subjective Assessment - 05/09/17 1547    Subjective Pt states she is doing well today.  STates her legs hurt her a little over the weekend but not now.   Currently in Pain? No/denies       Measurements taken on 05/04/17:   LYMPHEDEMA/ONCOLOGY  QUESTIONNAIRE - 05/04/17 1635            Right Lower Extremity Lymphedema   At Midpatella/Popliteal Crease 52 cm was 53   30 cm Proximal to Floor at Lateral Plantar Foot 46 cm was 52.2   20 cm Proximal to Floor at Lateral Plantar Foot 33.2 was 39.6   10 cm Proximal to Floor at Lateral Malleoli 29.5 cm was 32.2   Circumference of ankle/heel 35 cm.was 37.7   5 cm Proximal to 1st MTP Joint 25.3 cm was 26.2   Across MTP Joint 23.1 cm was27   Around Proximal Great Toe 8.4 cm was 9       Left Lower Extremity Lymphedema   At Midpatella/Popliteal Crease 54.4 cm was 54   30 cm Proximal to Floor at Lateral Plantar Foot 50 cm was 52.2   20 cm Proximal to Floor at Lateral Plantar Foot 33.5 cm was 36.7   10 cm Proximal to Floor at Lateral Malleoli 29.4 cm was 32.2   Circumference of ankle/heel 35 cm. Was 37.5   5 cm Proximal to 1st MTP Joint 23.3 cm was 26   Across MTP Joint 22 cm was 26.2    Around Proximal Great Toe 8 cm was 9                    [facility]  Adult PT Treatment/Exercise - 05/09/17 0001      Manual Therapy   Manual Therapy Manual Lymphatic Drainage (MLD)   Manual therapy comments done seperate from all other aspects of treatment    Manual Lymphatic Drainage (MLD) supraclavicular, deep and superfical abdominal followed by routing fluid using  inguinal axillary anastomsis B; and B LE   Compression Bandaging to Bil LE only.  #9 netting, 1/2" foam, cotton and multilayer short stretch bandaging.                   PT Short Term Goals - 04/27/17 1511      PT SHORT TERM GOAL #1   Title Pt to be aware of her increased risk for cellulitis and be able to verbalize the signs and symptoms and the need to obtain antibiotics.    Time 3   Period Weeks   Status Achieved     PT SHORT TERM GOAL #2   Title Pt volume to be decreased by 2 cm to allow decrease weight in legs to allow pt to be able to lift legs onto her bed with increased ease.    Time 3    Period Weeks   Status Achieved           PT Long Term Goals - 04/27/17 1511      PT LONG TERM GOAL #1   Title Pt volume to be decreased by 4 cm to allow pt to don pants and shoes easier.    Time 6   Period Weeks   Status On-going     PT LONG TERM GOAL #2   Title Pt to have recieved and be able to don compression garments    Time 6   Period Weeks   Status On-going     PT LONG TERM GOAL #3   Title Pt to have recieved an to be able to use a compression pump.   Time 6   Period Weeks   Status On-going               Plan - 05/09/17 1550    Clinical Impression Statement Pt is overall progressing with positive outcomes, decompression in distal LE's. Bilateral LE's remeasured last session (10/17) with noted reduction proximally on Rt and throughout the shin area on the Lt LE and some increase in volume around the ankles bilaterally.    Rehab Potential Good   PT Frequency 3x / week   PT Duration 6 weeks   PT Treatment/Interventions ADLs/Self Care Home Management;Compression bandaging;Manual lymph drainage;Patient/family education;Therapeutic exercise   PT Next Visit Plan Continue manual and compression bandaging to bilateral LE.  Measure on Wednesdays.    PT Home Exercise Plan Given to improve lymphatic circulation.       Patient will benefit from skilled therapeutic intervention in order to improve the following deficits and impairments:  Decreased activity tolerance, Decreased balance, Obesity, Increased edema, Difficulty walking  Visit Diagnosis: Lymphedema, not elsewhere classified   [id number]  CJ [id number]  CJ  Problem List Patient Active Problem List   Diagnosis Date Noted  . Femoral artery hematoma complicating cardiac catheterization 09/15/2016  . Status post coronary artery stent placement   . Difficulty in walking, not elsewhere classified   . Coronary artery disease involving native coronary artery of native heart with unstable angina pectoris ([place])   .  Coronary artery disease due to lipid rich plaque   . Chronic anticoagulation   . Acute systolic heart failure ([place])   .  Unstable angina ([place])   . Type 2 diabetes mellitus with diabetic foot ulcer ([place]) 08/30/2016  . Acute kidney injury ([place]) 08/30/2016  . Dyspnea 08/30/2016  . Peripheral edema   . Diabetes mellitus ([place])   . Persistent atrial fibrillation ([place])   . Morbidly obese [facility])    Teena Irani, PTA/CLT [place], PT CLT [phone] 05/09/2017, 3:53 PM  [place] [address] [place], [place], [address] Phone: [phone]   Fax:  [phone]  Name: Aizah Gehlhausen MRN: [id number] Date of Birth: 10-08-1935

## 2017-05-11 ENCOUNTER — Ambulatory Visit (HOSPITAL_COMMUNITY): Payer: PPO | Admitting: Physical Therapy

## 2017-05-11 DIAGNOSIS — I890 Lymphedema, not elsewhere classified: Secondary | ICD-10-CM | POA: Diagnosis not present

## 2017-05-11 NOTE — Therapy (Addendum)
[place], [place], [address] Phone: [phone]   Fax:  [phone]  Physical Therapy Treatment  Patient Details  Name: Kristina Howell MRN: [id number] Date of Birth: 04/27/1936 Referring Provider: Rozann Lesches  Encounter Date: 05/11/2017      PT End of Session - 05/11/17 1456    Visit Number 11   Number of Visits 18   Date for PT Re-Evaluation 05/06/17   Authorization Type [employer]   Authorization - Visit Number 11   Authorization - Number of Visits 18   PT Start Time 7939   PT Stop Time 1450   PT Time Calculation (min) 65 min   Activity Tolerance Patient tolerated treatment well   Behavior During Therapy [facility] for tasks assessed/performed      Past Medical History:  Diagnosis Date  . Atrial fibrillation ([place])   . CAD (coronary artery disease)    Multivessel, DES to LAD and RCA 08/2016  . Cardiomyopathy (HCC)    LVEF 45%  . CKD (chronic kidney disease) stage 3, GFR 30-59 ml/min   . Essential hypertension   . Gout   . Pancreatitis, recurrent ([place])   . Type 2 diabetes mellitus ([place])     Past Surgical History:  Procedure Laterality Date  . CHOLECYSTECTOMY    . CORONARY STENT INTERVENTION N/A 09/08/2016   Procedure: Coronary Stent Intervention;  Surgeon: Belva Crome, MD;  Location: [place] [facility];  Service: Cardiovascular;  Laterality: N/A;  . LEFT HEART CATH AND CORONARY ANGIOGRAPHY N/A 09/03/2016   Procedure: Left Heart Cath and Coronary Angiography;  Surgeon: Nelva Bush, MD;  Location: [place] [facility];  Service: Cardiovascular;  Laterality: N/A;  . TONSILLECTOMY      There were no vitals filed for this visit.      Subjective Assessment - 05/11/17 1455    Subjective PT states that she is feeling very good.  States that she has brought her compression garment in to see if she can fit in them.    Pertinent History Renal insufficiency, CAD, HT, DM    Currently in Pain?  No/denies               LYMPHEDEMA/ONCOLOGY QUESTIONNAIRE - 05/11/17 1340      Right Lower Extremity Lymphedema   At Midpatella/Popliteal Crease 50 cm   30 cm Proximal to Floor at Lateral Plantar Foot 43 cm   20 cm Proximal to Floor at Lateral Plantar Foot 31.3 1   10  cm Proximal to Floor at Lateral Malleoli 29 cm   Circumference of ankle/heel 34.1 cm.   5 cm Proximal to 1st MTP Joint 23.2 cm   Across MTP Joint 22.8 cm   Around Proximal Great Toe 8.2 cm     Left Lower Extremity Lymphedema   At Midpatella/Popliteal Crease 54 cm   30 cm Proximal to Floor at Lateral Plantar Foot 43.2 cm   20 cm Proximal to Floor at Lateral Plantar Foot 31.5 cm   10 cm Proximal to Floor at Lateral Malleoli 29.1 cm   Circumference of ankle/heel 33.1 cm.   5 cm Proximal to 1st MTP Joint 23 cm   Across MTP Joint 23 cm   Around Proximal Great Toe 8 cm                  [facility] Adult PT Treatment/Exercise - 05/11/17 0001      Manual Therapy   Manual Therapy Manual Lymphatic Drainage (MLD)  Manual therapy comments done seperate from all other aspects of treatment    Manual Lymphatic Drainage (MLD) supraclavicular, deep and superfical abdominal followed by routing fluid using  inguinal axillary anastomsis B; and B LE   Compression Bandaging donning pt own compression garment.                 PT Education - 05/11/17 1505    Education provided Yes   Education Details PT's volumes remeasured with continued decrease of volume.  Pt instructed in the use of the butler to assist in donning her compression garment.  This was very difficult for the pt to do but she was able to complete the task.  Therapist feels that pt current compression garment is 15-20 therapist explained that the pt needs 20-30 compression.  Therapist loaned Melina Copa to the pt so she and her husband can practice donning and doffing her compression hose as pt wants to see if her compression hose will contain her swelling.   Therapist ordered pt compression pump from [facility].     Person(s) Educated Patient   Methods Explanation;Verbal cues;Demonstration   Comprehension Verbalized understanding;Returned demonstration          PT Short Term Goals - 04/27/17 1511      PT SHORT TERM GOAL #1   Title Pt to be aware of her increased risk for cellulitis and be able to verbalize the signs and symptoms and the need to obtain antibiotics.    Time 3   Period Weeks   Status Achieved     PT SHORT TERM GOAL #2   Title Pt volume to be decreased by 2 cm to allow decrease weight in legs to allow pt to be able to lift legs onto her bed with increased ease.    Time 3   Period Weeks   Status Achieved           PT Long Term Goals - 04/27/17 1511      PT LONG TERM GOAL #1   Title Pt volume to be decreased by 4 cm to allow pt to don pants and shoes easier.    Time 6   Period Weeks   Status On-going     PT LONG TERM GOAL #2   Title Pt to have recieved and be able to don compression garments    Time 6   Period Weeks   Status On-going     PT LONG TERM GOAL #3   Title Pt to have recieved an to be able to use a compression pump.   Time 6   Period Weeks   Status On-going               Plan - 05/11/17 1457    Clinical Impression Statement PT's volumes remeasured with continued decrease of volume.  Pt instructed in the use of the butler to assist in donning her compression garment.  This was very difficult for the pt to do but she was able to complete the task.  Therapist feels that pt current compression garment is 15-20 therapist explained that the pt needs 20-30 compression.  Therapist loaned Melina Copa to the pt so she and her husband can practice donning and doffing her compression hose as pt wants to see if her compression hose will contain her swelling.  Therapist ordered pt compression pump from [facility].     Clinical Presentation Stable   Clinical Decision Making Low   Rehab Potential Good    PT Frequency  3x / week   PT Duration 6 weeks   PT Treatment/Interventions Manual techniques;Manual lymph drainage;Compression bandaging;ADLs/Self Care Home Management   PT Next Visit Plan Assess how pt compression garment has done.  See if pt has prescription for new compression garments if not give pt a copy with facilities that she can go to to acquire them.  Pt actually has 7 more treatments but only one scheduled.  Pt might want to discharge after Friday due to other medical obligaitons.  Check with patient.    Consulted and Agree with Plan of Care Patient      Patient will benefit from skilled therapeutic intervention in order to improve the following deficits and impairments:  Increased edema, Difficulty walking, Obesity, Pain  Visit Diagnosis: Lymphedema, not elsewhere classified     Problem List Patient Active Problem List   Diagnosis Date Noted  . Femoral artery hematoma complicating cardiac catheterization 09/15/2016  . Status post coronary artery stent placement   . Difficulty in walking, not elsewhere classified   . Coronary artery disease involving native coronary artery of native heart with unstable angina pectoris ([place])   . Coronary artery disease due to lipid rich plaque   . Chronic anticoagulation   . Acute systolic heart failure ([place])   . Unstable angina ([place])   . Type 2 diabetes mellitus with diabetic foot ulcer ([place]) 08/30/2016  . Acute kidney injury ([place]) 08/30/2016  . Dyspnea 08/30/2016  . Peripheral edema   . Diabetes mellitus ([place])   . Persistent atrial fibrillation ([place])   . Morbidly obese [facility])    [name], PT CLT [phone] 05/11/2017, 3:06 PM  [place] [address] [place], [place], [address] Phone: [phone]   Fax:  [phone]  Name: Kristina Howell MRN: [id number] Date of Birth: [birth year]

## 2017-05-13 ENCOUNTER — Ambulatory Visit (HOSPITAL_COMMUNITY): Payer: PPO | Admitting: Physical Therapy

## 2017-05-13 ENCOUNTER — Telehealth (HOSPITAL_COMMUNITY): Payer: Self-pay | Admitting: Physical Therapy

## 2017-05-13 DIAGNOSIS — I890 Lymphedema, not elsewhere classified: Secondary | ICD-10-CM

## 2017-05-13 NOTE — Therapy (Signed)
[place], [place], [address] Phone: [phone]   Fax:  [phone]  Physical Therapy Treatment  Patient Details  Name: Kristina Howell MRN: [id number] Date of Birth: 06-11-1936 Referring Provider: Rozann Lesches  Encounter Date: 05/13/2017      PT End of Session - 05/13/17 1623    Visit Number 12   Number of Visits 18   Date for PT Re-Evaluation 05/06/17   Authorization Type [employer]   Authorization - Visit Number 12   Authorization - Number of Visits 18   PT Start Time 1400  Pt was late   PT Stop Time 1515   PT Time Calculation (min) 75 min   Activity Tolerance Patient tolerated treatment well   Behavior During Therapy [facility] for tasks assessed/performed      Past Medical History:  Diagnosis Date  . Atrial fibrillation ([place])   . CAD (coronary artery disease)    Multivessel, DES to LAD and RCA 08/2016  . Cardiomyopathy (HCC)    LVEF 45%  . CKD (chronic kidney disease) stage 3, GFR 30-59 ml/min   . Essential hypertension   . Gout   . Pancreatitis, recurrent ([place])   . Type 2 diabetes mellitus ([place])     Past Surgical History:  Procedure Laterality Date  . CHOLECYSTECTOMY    . CORONARY STENT INTERVENTION N/A 09/08/2016   Procedure: Coronary Stent Intervention;  Surgeon: Belva Crome, MD;  Location: [place] [facility];  Service: Cardiovascular;  Laterality: N/A;  . LEFT HEART CATH AND CORONARY ANGIOGRAPHY N/A 09/03/2016   Procedure: Left Heart Cath and Coronary Angiography;  Surgeon: Nelva Bush, MD;  Location: [place] [facility];  Service: Cardiovascular;  Laterality: N/A;  . TONSILLECTOMY      There were no vitals filed for this visit.      Subjective Assessment - 05/13/17 1613    Subjective Pt states that she was unable to practice using the [facility] due to the fact that when she went to take her compression garment off she was unable to and they got stuck on her lower leg.  They  were digging in causing significant pain when she was able to find her husband to take them off.  After that she was fearful of taking them off    Pertinent History Renal insufficiency, CAD, HT, DM    Currently in Pain? No/denies                         [facility] Adult PT Treatment/Exercise - 05/13/17 0001      Manual Therapy   Manual Therapy Manual Lymphatic Drainage (MLD)   Manual therapy comments done seperate from all other aspects of treatment    Manual Lymphatic Drainage (MLD) supraclavicular, deep and superfical abdominal followed by routing fluid using  inguinal axillary anastomsis B; and B LE   Compression Bandaging to Bil LE only.  #9 netting, 1/2" foam, cotton and multilayer short stretch bandaging.                 PT Education - 05/13/17 1622    Education provided Yes   Education Details Therapist showed pt farrow strong compression garment.  Therapist and pt felt this with compression carpri's might be the best fit for the patient.   Person(s) Educated Patient   Methods Explanation   Comprehension Verbalized understanding          PT Short Term Goals - 04/27/17  [place] #1   Title Pt to be aware of her increased risk for cellulitis and be able to verbalize the signs and symptoms and the need to obtain antibiotics.    Time 3   Period Weeks   Status Achieved     PT SHORT TERM GOAL #2   Title Pt volume to be decreased by 2 cm to allow decrease weight in legs to allow pt to be able to lift legs onto her bed with increased ease.    Time 3   Period Weeks   Status Achieved           PT Long Term Goals - 04/27/17 1511      PT LONG TERM GOAL #1   Title Pt volume to be decreased by 4 cm to allow pt to don pants and shoes easier.    Time 6   Period Weeks   Status On-going     PT LONG TERM GOAL #2   Title Pt to have recieved and be able to don compression garments    Time 6   Period Weeks   Status On-going     PT  LONG TERM GOAL #3   Title Pt to have recieved an to be able to use a compression pump.   Time 6   Period Weeks   Status On-going               Plan - 05/13/17 1624    Clinical Impression Statement Pt to department stating that her compression garments are not going to work as pt has to much difficulty with donning and doffing.  Therapist and pt decided that the [facility] strong with capri pants maybe the best solution.  Order was faxed to [employer] for the garment.     Rehab Potential Good   PT Frequency 3x / week   PT Duration 6 weeks   PT Treatment/Interventions Manual techniques;Manual lymph drainage;Compression bandaging;ADLs/Self Care Home Management   PT Next Visit Plan Continue for two more weeks or until volumes have stabilized.    Consulted and Agree with Plan of Care Patient      Patient will benefit from skilled therapeutic intervention in order to improve the following deficits and impairments:  Increased edema, Difficulty walking, Obesity, Pain  Visit Diagnosis: Lymphedema, not elsewhere classified     Problem List Patient Active Problem List   Diagnosis Date Noted  . Femoral artery hematoma complicating cardiac catheterization 09/15/2016  . Status post coronary artery stent placement   . Difficulty in walking, not elsewhere classified   . Coronary artery disease involving native coronary artery of native heart with unstable angina pectoris ([place])   . Coronary artery disease due to lipid rich plaque   . Chronic anticoagulation   . Acute systolic heart failure ([place])   . Unstable angina ([place])   . Type 2 diabetes mellitus with diabetic foot ulcer ([place]) 08/30/2016  . Acute kidney injury ([place]) 08/30/2016  . Dyspnea 08/30/2016  . Peripheral edema   . Diabetes mellitus ([place])   . Persistent atrial fibrillation ([place])   . Morbidly obese [facility])     [name], PT CLT [phone] 05/13/2017, 4:27 PM  [place] [address] [place], [place], [address] Phone: [phone]   Fax:  [phone]  Name: Kristina Howell MRN: [id number] Date of Birth: [birth year]

## 2017-05-13 NOTE — Telephone Encounter (Signed)
Pt agreed to try to get transportation10/29/18  and will call and let us know if she can make this apptment. NF 05/13/17

## 2017-05-16 ENCOUNTER — Ambulatory Visit (HOSPITAL_COMMUNITY): Payer: PPO | Admitting: Physical Therapy

## 2017-05-16 DIAGNOSIS — I890 Lymphedema, not elsewhere classified: Secondary | ICD-10-CM | POA: Diagnosis not present
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
## 2017-05-19 ENCOUNTER — Ambulatory Visit (HOSPITAL_COMMUNITY): Payer: PPO | Admitting: Physical Therapy

## 2017-05-19 ENCOUNTER — Telehealth (HOSPITAL_COMMUNITY): Payer: Self-pay | Admitting: Physician Assistant

## 2017-05-19 NOTE — Telephone Encounter (Signed)
05/19/17  pt left a message saying that she wouldn't be able to make her appt today and can reschedule later

## 2017-05-23 DIAGNOSIS — E559 Vitamin D deficiency, unspecified: Secondary | ICD-10-CM | POA: Diagnosis not present

## 2017-05-23 DIAGNOSIS — Z1159 Encounter for screening for other viral diseases: Secondary | ICD-10-CM | POA: Diagnosis not present

## 2017-05-23 DIAGNOSIS — N183 Chronic kidney disease, stage 3 (moderate): Secondary | ICD-10-CM | POA: Diagnosis not present

## 2017-05-23 DIAGNOSIS — N289 Disorder of kidney and ureter, unspecified: Secondary | ICD-10-CM | POA: Diagnosis not present

## 2017-05-23 DIAGNOSIS — Z79899 Other long term (current) drug therapy: Secondary | ICD-10-CM | POA: Diagnosis not present

## 2017-05-23 DIAGNOSIS — D509 Iron deficiency anemia, unspecified: Secondary | ICD-10-CM | POA: Diagnosis not present

## 2017-05-23 DIAGNOSIS — I129 Hypertensive chronic kidney disease with stage 1 through stage 4 chronic kidney disease, or unspecified chronic kidney disease: Secondary | ICD-10-CM | POA: Diagnosis not present

## 2017-05-23 DIAGNOSIS — R809 Proteinuria, unspecified: Secondary | ICD-10-CM | POA: Diagnosis not present

## 2017-05-24 ENCOUNTER — Telehealth (HOSPITAL_COMMUNITY): Payer: Self-pay | Admitting: Physician Assistant

## 2017-05-24 NOTE — Telephone Encounter (Signed)
05/24/17  cx because she doesn't have anyone to bring her... We tried to rescehdule for 11/7 but didn't have a 90 minute slot available right now.Marland KitchenMarland Kitchen

## 2017-05-25 ENCOUNTER — Encounter (HOSPITAL_COMMUNITY): Payer: PPO | Admitting: Physical Therapy

## 2017-05-27 ENCOUNTER — Ambulatory Visit (HOSPITAL_COMMUNITY): Payer: PPO | Attending: Cardiology | Admitting: Physical Therapy

## 2017-05-30 ENCOUNTER — Telehealth (HOSPITAL_COMMUNITY): Payer: Self-pay | Admitting: Physical Therapy

## 2017-05-30 NOTE — Telephone Encounter (Signed)
Pt called the office re pump from [facility].  Therapist called [name] who stated that the pump was boxed and ready to be sent but the post offices are closed today.  The pump should go out tomorrow.  Therapist called pt and relayed this information.   [name], [place] CLT [phone]

## 2017-05-30 NOTE — Telephone Encounter (Signed)
Kristina Howell from [place] called to state [employer] denied [name] her garment purchase. Kristina Howell offered to reduce price and pt stated she was not buying anything that her insurance would not cover.

## 2017-06-07 DIAGNOSIS — R809 Proteinuria, unspecified: Secondary | ICD-10-CM | POA: Diagnosis not present

## 2017-06-07 DIAGNOSIS — I10 Essential (primary) hypertension: Secondary | ICD-10-CM | POA: Diagnosis not present

## 2017-06-07 DIAGNOSIS — E1129 Type 2 diabetes mellitus with other diabetic kidney complication: Secondary | ICD-10-CM | POA: Diagnosis not present

## 2017-06-07 DIAGNOSIS — N183 Chronic kidney disease, stage 3 (moderate): Secondary | ICD-10-CM | POA: Diagnosis not present

## 2017-06-20 DIAGNOSIS — I10 Essential (primary) hypertension: Secondary | ICD-10-CM | POA: Diagnosis not present

## 2017-06-20 DIAGNOSIS — R609 Edema, unspecified: Secondary | ICD-10-CM | POA: Diagnosis not present

## 2017-06-20 DIAGNOSIS — M109 Gout, unspecified: Secondary | ICD-10-CM | POA: Diagnosis not present

## 2017-06-20 DIAGNOSIS — Z6841 Body Mass Index (BMI) 40.0 and over, adult: Secondary | ICD-10-CM | POA: Diagnosis not present

## 2017-06-20 DIAGNOSIS — E1165 Type 2 diabetes mellitus with hyperglycemia: Secondary | ICD-10-CM | POA: Diagnosis not present

## 2017-06-20 DIAGNOSIS — I482 Chronic atrial fibrillation: Secondary | ICD-10-CM | POA: Diagnosis not present

## 2017-06-20 DIAGNOSIS — E782 Mixed hyperlipidemia: Secondary | ICD-10-CM | POA: Diagnosis not present

## 2017-06-27 ENCOUNTER — Ambulatory Visit: Payer: PPO | Admitting: Cardiology

## 2017-07-17 DIAGNOSIS — I890 Lymphedema, not elsewhere classified: Secondary | ICD-10-CM | POA: Diagnosis not present

## 2017-08-05 NOTE — Progress Notes (Signed)
Cardiology Office Note  Date: 08/08/2017   ID: Kristina Howell, DOB 1936/06/07, MRN [id number]  PCP: Tawni Carnes, PA-C  Primary Cardiologist: Rozann Lesches, MD   Chief Complaint  Patient presents with  . Lymphedema  . Coronary Artery Disease    History of Present Illness: Kristina Howell is a 82 y.o. female last seen in September 2018. She presents with family member for a routine follow-up visit. She did very well with physical therapy for mechanical treatment of lymphedema, has lost a significant amount of weight. It does not sound like she has been consistent with using her home pump however and compression - we discussed this today.  She also reports having uncontrolled glucose levels on current diabetes regimen. She has not seen an endocrinologist. Of note, she has a reported history of pancreatitis associated with glipizide.  She does not report any active angina symptoms. She reports no bleeding problems on renally dosed Eliquis.  Cardiac testing from February 2018 is outlined below.  Past Medical History:  Diagnosis Date  . Atrial fibrillation ([place])   . CAD (coronary artery disease)    Multivessel, DES to LAD and RCA 08/2016  . Cardiomyopathy (HCC)    LVEF 45%  . CKD (chronic kidney disease) stage 3, GFR 30-59 ml/min (HCC)   . Essential hypertension   . Gout   . Pancreatitis, recurrent ([place])   . Type 2 diabetes mellitus ([place])     Past Surgical History:  Procedure Laterality Date  . CHOLECYSTECTOMY    . CORONARY STENT INTERVENTION N/A 09/08/2016   Procedure: Coronary Stent Intervention;  Surgeon: Belva Crome, MD;  Location: [facility];  Service: Cardiovascular;  Laterality: N/A;  . LEFT HEART CATH AND CORONARY ANGIOGRAPHY N/A 09/03/2016   Procedure: Left Heart Cath and Coronary Angiography;  Surgeon: Nelva Bush, MD;  Location: [facility];  Service: Cardiovascular;  Laterality: N/A;  . TONSILLECTOMY      Current Outpatient Medications    Medication Sig Dispense Refill  . allopurinol (ZYLOPRIM) 300 MG tablet Take 300 mg by mouth daily.    . benazepril (LOTENSIN) 40 MG tablet Take 40 mg by mouth daily.    . clopidogrel (PLAVIX) 75 MG tablet take 1 tablet by mouth once daily ---WITH BREAKFAST 90 tablet 0  . ELIQUIS 2.5 MG TABS tablet take 1 tablet by mouth twice a day 180 tablet 1  . furosemide (LASIX) 80 MG tablet take 1 tablet by mouth once daily (NOTE: MAY TAKE 1/2 TABLET FOR INCREASED LEG SWELLING OR WEIGHT GAIN) 90 tablet 1  . Insulin Glargine (TOUJEO SOLOSTAR) 300 UNIT/ML SOPN Inject 26 Units into the skin at bedtime.     . metolazone (ZAROXOLYN) 2.5 MG tablet TAKE ONE TABLET BY MOUTH TWICE A WEEK AS NEEDED FOR LEG SWELLING, PLEASE DO NOT TAKE THIS NO MORE THAN TWICE WEEKLY, ALSO STOP FERROUS SULFA 8 tablet 1  . metoprolol succinate (TOPROL-XL) 50 MG 24 hr tablet Take 50 mg by mouth at bedtime.    . rosuvastatin (CRESTOR) 20 MG tablet Take 1 tablet (20 mg total) by mouth daily. 90 tablet 3   No current facility-administered medications for this visit.    Allergies:  Patient has no known allergies.   Social History: The patient  reports that  has never smoked. she has never used smokeless tobacco. She reports that she does not drink alcohol or use drugs.   ROS:  Please see the history of present illness. Otherwise, complete review of  systems is positive for arthritic pains.  All other systems are reviewed and negative.   Physical Exam: VS:  BP (!) 142/68   Pulse 74   Ht 5\' 6"  (1.676 m)   Wt 257 lb 6.4 oz (116.8 kg)   SpO2 98%   BMI 41.55 kg/m , BMI Body mass index is 41.55 kg/m.  Wt Readings from Last 3 Encounters:  08/08/17 257 lb 6.4 oz (116.8 kg)  03/22/17 272 lb 9.6 oz (123.7 kg)  12/10/16 274 lb (124.3 kg)    General: Morbidly obese woman, appears comfortable at rest. HEENT: Conjunctiva and lids normal, oropharynx clear. Neck: Supple, no elevated JVP or carotid bruits, no thyromegaly. Lungs: Clear to  auscultation, nonlabored breathing at rest. Cardiac: Irregular, no S3, 2/6 systolic murmur, no pericardial rub. Abdomen: Soft, nontender, bowel sounds present, no guarding or rebound. Extremities: Bilateral lower leg edema with improved lymphedema, distal pulses 1+. Skin: Warm and dry. Musculoskeletal: No kyphosis. Neuropsychiatric: Alert and oriented x3, affect grossly appropriate.  ECG: I personally reviewed the tracing from 09/07/2016 which showed atrial fibrillation with poor R-wave progression anteriorly and nonspecific ST-T wave abnormalities.  Recent Labwork: 08/29/2016: B Natriuretic Peptide 862.2 09/03/2016: ALT 25; AST 24 09/16/2016: BUN 47; Creatinine, Ser 2.28; Hemoglobin 9.0; Platelets 331; Potassium 4.0; Sodium 133     Component Value Date/Time   CHOL 242 (H) 08/30/2016 0206   TRIG 130 08/30/2016 0206   HDL 36 (L) 08/30/2016 0206   CHOLHDL 6.7 08/30/2016 0206   VLDL 26 08/30/2016 0206   LDLCALC 180 (H) 08/30/2016 0206  August 2018: Hemoglobin 14.5, platelets 190, BUN 47, creatinine 2.08, potassium 4.3, AST 12, ALT 15, cholesterol 178, triglycerides 318, HDL 31, LDL 83, TSH 4.54, hemoglobin A1c 9.9  Other Studies Reviewed Today:  Echocardiogram2/06/2017: Study Conclusions  - Left ventricle: The cavity size was normal. Wall thickness was increased in a pattern of mild LVH. Severe hypokinesis of the mid anteroseptal wall, apical septal wall, true apex, mid to apical anterior wall. Indeterminant diastolic function (atrial fibrillation). The estimated ejection fraction was 45%. - Aortic valve: There was no stenosis. - Mitral valve: Mildly to moderately calcified annulus. There was trivial regurgitation. - Left atrium: The atrium was mildly to moderately dilated. - Right ventricle: The cavity size was normal. Systolic function was normal. - Pulmonary arteries: No complete TR doppler jet so unable to estimate PA systolic pressure. - Systemic veins: IVC  measured 2.3 cm with < 50% respirophasic variation, suggesting RA pressure 15 mmHg.  Impressions:  - Normal LV size with mild LV hypertrophy. EF 45% with wall motion abnormalities as described above, in LAD coronary distribution. Normal RV size and systolic function. No significant valvular abnormalities.  Cardiac catheterization 09/03/2016: Conclusions: 1. Multivessel CAD, including 90% mid LAD stenosis involving small diagonal branch, 70% ostial/proximal ramus lesion, 30% OM stenosis and sequential 70-80% proximal/mid RCA stenoses. 2. Mildly elevated left ventricular filling pressure. 3. Mild to moderate aortic valve gradient, which is suboptimally evaluated due to heart rate variability related to atrial fibrillation. 4. Right radial artery stenosis, which is likely a combination of fixed disease and vasospasm. Stenosis was successfully navigated with angled micropuncture and Versacore wires.  Recommendations: 1. Medical optimization, including further diuresis and close monitoring of renal function. 2. Plan for stage PCI to mid LAD and proximal/mid RCA next week when volume status has improved and renal function has improved/stabilized. 3. Restart heparin infusion 4 hours after TR band removal.  Cardiac PCI 09/08/2016:  CHIP due to renal  insufficiency, long diffuse disease in LAD and right coronary, in an elderly diabetic patient felt to be a poor candidate for CABG. Greatest risk is acute kidney injury and access site bleeding. Contrast during the procedure 160 cc.Small hematoma present above cath site at completion of procedure.  Successful LAD provisional stenting with reduction in 90% stenosis to 0% using a 28 x 2.75 Synergy DES postdilated to 3.0 mm in diameter. TIMI grade 2 flow was noted in the diagonal post procedure as was a case prior to the procedure.  Successful proximal to mid RCA stenting with overlapping 3.5 Promus Premier 24 and 8 mm stents. Stents were  postdilated to 4.0 mm reducing stenosis to 0% with TIMI grade 3 flow.  Assessment and Plan:  1. Lymphedema. Improved significantly after PT with mechanical compression. We discussed being consistent with using her home pump and compression. She also continues on diuretics.  2. Poorly controlled type 2 diabetes mellitus. She showed me home glucose checks in the 300-400 range. We will refer her to endocrinology in [place], otherwise keep follow-up with PCP. She reports previous history of pancreatitis due to glipizide.  3. CAD status post DES to the LAD and RCA in February 2018. She remains on Plavix and statin therapy.  4. Cardiomyopathy with LVEF approximately 45%.  5. Persistent atrial fibrillation, heart rate controlled.  Current medicines were reviewed with the patient today.   Orders Placed This Encounter  Procedures  . Ambulatory referral to Endocrinology  . EKG 12-Lead    Disposition: Follow-up in 6 months.  Signed, Satira Sark, MD, [facility] 08/08/2017 2:53 PM    [facility], [place], [place] [address] Phone: [phone]; Fax: [phone]

## 2017-08-08 ENCOUNTER — Ambulatory Visit: Payer: PPO | Admitting: Cardiology

## 2017-08-08 ENCOUNTER — Encounter: Payer: Self-pay | Admitting: Cardiology

## 2017-08-08 VITALS — BP 142/68 | HR 74 | Ht 66.0 in | Wt 257.4 lb

## 2017-08-08 DIAGNOSIS — I481 Persistent atrial fibrillation: Secondary | ICD-10-CM

## 2017-08-08 DIAGNOSIS — I890 Lymphedema, not elsewhere classified: Secondary | ICD-10-CM | POA: Diagnosis not present

## 2017-08-08 DIAGNOSIS — I4819 Other persistent atrial fibrillation: Secondary | ICD-10-CM

## 2017-08-08 DIAGNOSIS — I255 Ischemic cardiomyopathy: Secondary | ICD-10-CM

## 2017-08-08 DIAGNOSIS — E1165 Type 2 diabetes mellitus with hyperglycemia: Secondary | ICD-10-CM

## 2017-08-08 DIAGNOSIS — I25119 Atherosclerotic heart disease of native coronary artery with unspecified angina pectoris: Secondary | ICD-10-CM | POA: Diagnosis not present

## 2017-08-08 NOTE — Patient Instructions (Signed)

## 2017-08-17 DIAGNOSIS — E559 Vitamin D deficiency, unspecified: Secondary | ICD-10-CM | POA: Diagnosis not present

## 2017-08-17 DIAGNOSIS — Z79899 Other long term (current) drug therapy: Secondary | ICD-10-CM | POA: Diagnosis not present

## 2017-08-17 DIAGNOSIS — I129 Hypertensive chronic kidney disease with stage 1 through stage 4 chronic kidney disease, or unspecified chronic kidney disease: Secondary | ICD-10-CM | POA: Diagnosis not present

## 2017-08-17 DIAGNOSIS — N183 Chronic kidney disease, stage 3 (moderate): Secondary | ICD-10-CM | POA: Diagnosis not present

## 2017-08-17 DIAGNOSIS — D509 Iron deficiency anemia, unspecified: Secondary | ICD-10-CM | POA: Diagnosis not present

## 2017-08-17 DIAGNOSIS — R809 Proteinuria, unspecified: Secondary | ICD-10-CM | POA: Diagnosis not present

## 2017-08-23 DIAGNOSIS — I10 Essential (primary) hypertension: Secondary | ICD-10-CM | POA: Diagnosis not present

## 2017-08-23 DIAGNOSIS — E1129 Type 2 diabetes mellitus with other diabetic kidney complication: Secondary | ICD-10-CM | POA: Diagnosis not present

## 2017-08-23 DIAGNOSIS — R809 Proteinuria, unspecified: Secondary | ICD-10-CM | POA: Diagnosis not present

## 2017-08-23 DIAGNOSIS — N184 Chronic kidney disease, stage 4 (severe): Secondary | ICD-10-CM | POA: Diagnosis not present

## 2017-08-30 ENCOUNTER — Encounter: Payer: Self-pay | Admitting: "Endocrinology

## 2017-08-30 ENCOUNTER — Ambulatory Visit: Payer: PPO | Admitting: "Endocrinology

## 2017-08-30 VITALS — BP 147/86 | HR 69 | Ht 66.0 in | Wt 256.0 lb

## 2017-08-30 DIAGNOSIS — Z794 Long term (current) use of insulin: Secondary | ICD-10-CM | POA: Diagnosis not present

## 2017-08-30 DIAGNOSIS — I10 Essential (primary) hypertension: Secondary | ICD-10-CM | POA: Diagnosis not present

## 2017-08-30 DIAGNOSIS — E119 Type 2 diabetes mellitus without complications: Secondary | ICD-10-CM | POA: Insufficient documentation

## 2017-08-30 DIAGNOSIS — E1122 Type 2 diabetes mellitus with diabetic chronic kidney disease: Secondary | ICD-10-CM

## 2017-08-30 DIAGNOSIS — N184 Chronic kidney disease, stage 4 (severe): Secondary | ICD-10-CM | POA: Diagnosis not present

## 2017-08-30 DIAGNOSIS — E782 Mixed hyperlipidemia: Secondary | ICD-10-CM | POA: Diagnosis not present

## 2017-08-30 DIAGNOSIS — E1159 Type 2 diabetes mellitus with other circulatory complications: Secondary | ICD-10-CM | POA: Diagnosis not present

## 2017-08-30 NOTE — Patient Instructions (Signed)

## 2017-08-30 NOTE — Progress Notes (Signed)
Consult Note       08/30/2017, 5:27 PM   Subjective:    Patient ID: Kristina Howell, female    DOB: 01-28-1936.  Kristina Howell is being seen in consultation for management of currently uncontrolled symptomatic diabetes requested by  Rosalee Kaufman, PA-C.   Past Medical History:  Diagnosis Date  . Atrial fibrillation ([place])   . CAD (coronary artery disease)    Multivessel, DES to LAD and RCA 08/2016  . Cardiomyopathy (HCC)    LVEF 45%  . CKD (chronic kidney disease) stage 3, GFR 30-59 ml/min (HCC)   . Essential hypertension   . Gout   . Pancreatitis, recurrent ([place])   . Type 2 diabetes mellitus ([place])    Past Surgical History:  Procedure Laterality Date  . CHOLECYSTECTOMY    . CORONARY STENT INTERVENTION N/A 09/08/2016   Procedure: Coronary Stent Intervention;  Surgeon: Belva Crome, MD;  Location: [facility];  Service: Cardiovascular;  Laterality: N/A;  . LEFT HEART CATH AND CORONARY ANGIOGRAPHY N/A 09/03/2016   Procedure: Left Heart Cath and Coronary Angiography;  Surgeon: Nelva Bush, MD;  Location: [facility];  Service: Cardiovascular;  Laterality: N/A;  . TONSILLECTOMY     Social History   Socioeconomic History  . Marital status: Married    Spouse name: None  . Number of children: None  . Years of education: None  . Highest education level: None  Social Needs  . Financial resource strain: None  . Food insecurity - worry: None  . Food insecurity - inability: None  . Transportation needs - medical: None  . Transportation needs - non-medical: None  Occupational History  . None  Tobacco Use  . Smoking status: Never Smoker  . Smokeless tobacco: Never Used  Substance and Sexual Activity  . Alcohol use: No  . Drug use: No  . Sexual activity: None  Other Topics Concern  . None  Social History Narrative  . None   Outpatient Encounter Medications as of  08/30/2017  Medication Sig  . allopurinol (ZYLOPRIM) 300 MG tablet Take 300 mg by mouth daily.  . benazepril (LOTENSIN) 40 MG tablet Take 40 mg by mouth daily.  . clopidogrel (PLAVIX) 75 MG tablet take 1 tablet by mouth once daily ---WITH BREAKFAST  . ELIQUIS 2.5 MG TABS tablet take 1 tablet by mouth twice a day  . furosemide (LASIX) 80 MG tablet take 1 tablet by mouth once daily (NOTE: MAY TAKE 1/2 TABLET FOR INCREASED LEG SWELLING OR WEIGHT GAIN)  . Insulin Glargine (TOUJEO SOLOSTAR) 300 UNIT/ML SOPN Inject 40 Units into the skin at bedtime.  . metolazone (ZAROXOLYN) 2.5 MG tablet TAKE ONE TABLET BY MOUTH TWICE A WEEK AS NEEDED FOR LEG SWELLING, PLEASE DO NOT TAKE THIS NO MORE THAN TWICE WEEKLY, ALSO STOP FERROUS SULFA  . metoprolol succinate (TOPROL-XL) 50 MG 24 hr tablet Take 50 mg by mouth at bedtime.  . rosuvastatin (CRESTOR) 20 MG tablet Take 1 tablet (20 mg total) by mouth daily.   No facility-administered encounter medications on file as of 08/30/2017.     ALLERGIES: No Known Allergies  VACCINATION STATUS: Immunization History  Administered Date(s) Administered  . Pneumococcal Polysaccharide-23 09/05/2016    Diabetes  She presents for her initial diabetic visit. She has type 2 diabetes mellitus. Onset time: She was diagnosed at approximate age of 24 years. Her disease course has been worsening. There are no hypoglycemic associated symptoms. Pertinent negatives for hypoglycemia include no confusion, headaches, pallor or seizures. Associated symptoms include blurred vision, fatigue, polydipsia and polyuria. Pertinent negatives for diabetes include no chest pain and no polyphagia. There are no hypoglycemic complications. Symptoms are worsening. Diabetic complications include heart disease, nephropathy, peripheral neuropathy and retinopathy. Risk factors for coronary artery disease include dyslipidemia, diabetes mellitus, family history, obesity, sedentary lifestyle and post-menopausal.  Current diabetic treatment includes insulin injections (She is currently on Toujeo 26 units nightly.). Her weight is decreasing steadily (She has lost approximately 20 pounds over the last year due to  diuresis related to her lymphedema.). She is following a generally unhealthy diet. When asked about meal planning, she reported none. She has not had a previous visit with a dietitian. She never participates in exercise. (She did not bring any meter or logs to review today.  Her recent A1c was 9.9%.) An ACE inhibitor/angiotensin II receptor blocker is being taken. Eye exam is current.  Hypertension  This is a chronic problem. The current episode started more than 1 year ago. The problem is uncontrolled. Associated symptoms include blurred vision. Pertinent negatives include no chest pain, headaches, palpitations or shortness of breath. Risk factors for coronary artery disease include diabetes mellitus, dyslipidemia, family history, obesity, post-menopausal state and sedentary lifestyle. Past treatments include ACE inhibitors. Hypertensive end-organ damage includes CAD/MI and retinopathy.  Hyperlipidemia  This is a chronic problem. The current episode started more than 1 year ago. The problem is uncontrolled. Exacerbating diseases include diabetes and obesity. Pertinent negatives include no chest pain, myalgias or shortness of breath. Risk factors for coronary artery disease include diabetes mellitus, dyslipidemia, obesity, a sedentary lifestyle, post-menopausal and family history.      Review of Systems  Constitutional: Positive for fatigue. Negative for chills, fever and unexpected weight change.  HENT: Negative for trouble swallowing and voice change.   Eyes: Positive for blurred vision. Negative for visual disturbance.  Respiratory: Negative for cough, shortness of breath and wheezing.   Cardiovascular: Negative for chest pain, palpitations and leg swelling.  Gastrointestinal: Negative for diarrhea,  nausea and vomiting.  Endocrine: Positive for polydipsia and polyuria. Negative for cold intolerance, heat intolerance and polyphagia.  Musculoskeletal: Positive for gait problem. Negative for arthralgias and myalgias.  Skin: Negative for color change, pallor, rash and wound.  Neurological: Negative for seizures and headaches.  Psychiatric/Behavioral: Negative for confusion and suicidal ideas.    Objective:    BP (!) 147/86   Pulse 69   Ht 5\' 6"  (1.676 m)   Wt 256 lb (116.1 kg)   BMI 41.32 kg/m   Wt Readings from Last 3 Encounters:  08/30/17 256 lb (116.1 kg)  08/08/17 257 lb 6.4 oz (116.8 kg)  03/22/17 272 lb 9.6 oz (123.7 kg)     Physical Exam  Constitutional: She is oriented to person, place, and time. She appears well-developed.  HENT:  Head: Normocephalic and atraumatic.  Eyes: EOM are normal.  Neck: Normal range of motion. Neck supple. No tracheal deviation present. No thyromegaly present.  Cardiovascular: Normal rate and regular rhythm.  Pulmonary/Chest: Effort normal and breath sounds normal.  Abdominal: Soft. Bowel sounds are normal. There is no tenderness. There  is no guarding.  Musculoskeletal: Normal range of motion. She exhibits edema.  Large bilateral lower extremities due to lymphedema, pitting and nonpitting edema.  Neurological: She is alert and oriented to person, place, and time. She has normal reflexes. No cranial nerve deficit. Coordination normal.  Skin: Skin is warm and dry. No rash noted. No erythema. No pallor.  Psychiatric: She has a normal mood and affect. Judgment normal.      CMP ( most recent) CMP     Component Value Date/Time   NA 133 (L) 09/16/2016 0330   K 4.0 09/16/2016 0330   CL 101 09/16/2016 0330   CO2 23 09/16/2016 0330   GLUCOSE 239 (H) 09/16/2016 0330   BUN 47 (A) 02/26/2017   CREATININE 2.1 (A) 02/26/2017   CREATININE 2.28 (H) 09/16/2016 0330   CALCIUM 8.9 09/16/2016 0330   PROT 5.5 (L) 09/03/2016 0031   ALBUMIN 2.5 (L)  09/03/2016 0031   AST 24 09/03/2016 0031   ALT 25 09/03/2016 0031   ALKPHOS 79 09/03/2016 0031   BILITOT 0.4 09/03/2016 0031   GFRNONAA 19 (L) 09/16/2016 0330   GFRAA 22 (L) 09/16/2016 0330     Diabetic Labs (most recent): Lab Results  Component Value Date   HGBA1C 9.9 02/26/2017   HGBA1C 10.3 (H) 08/30/2016     Lipid Panel ( most recent) Lipid Panel     Component Value Date/Time   CHOL 178 02/26/2017   TRIG 318 (A) 02/26/2017   HDL 31 (A) 02/26/2017   CHOLHDL 6.7 08/30/2016 0206   VLDL 26 08/30/2016 0206   LDLCALC 83 02/26/2017      Lab Results  Component Value Date   TSH 4.54 02/26/2017      Assessment & Plan:   1. Type 2 diabetes mellitus with stage 4 chronic kidney disease, coronary artery disease with long-term current use of insulin ([place])  - Kristina Howell has currently uncontrolled symptomatic type 2 DM since 82 years of age,  with most recent A1c of 9.9 %. Recent labs reviewed.  -her diabetes is complicated by weight loss and Kristina Howell remains at a high risk for more acute and chronic complications which include CAD, CVA, CKD, retinopathy, and neuropathy. These are all discussed in detail with the patient.  - I have counseled her on diet management and weight loss, by adopting a carbohydrate restricted/protein rich diet.  - Suggestion is made for her to avoid simple carbohydrates  from her diet including Cakes, Sweet Desserts, Ice Cream, Soda (diet and regular), Sweet Tea, Candies, Chips, Cookies, Store Bought Juices, Alcohol in Excess of  1-2 drinks a day, Artificial Sweeteners, and "Sugar-free" Products. This will help patient to have stable blood glucose profile and potentially avoid unintended weight gain.  - I encouraged her to switch to  unprocessed or minimally processed complex starch and increased protein intake (animal or plant source), fruits, and vegetables.  - she is advised to stick to a routine mealtimes to eat 3 meals  a day and avoid  unnecessary snacks ( to snack only to correct hypoglycemia).   - she will be scheduled with Jearld Fenton, RDN, CDE for individualized diabetes education.  - I have approached her with the following individualized plan to manage diabetes and patient agrees:   -Given her current glycemic burden, she will require larger dose of insulin in order for her to control diabetes to target.  -In preparation, I approach her to start monitoring blood glucose strictly 4 times a day-before meals and  at bedtime and return in 1 week with her meter and logs.   -Her daughter is offering help.   -In the meantime, I advised her to increase her Toujeo to 40 units nightly.   - Patient is warned not to take insulin without proper monitoring per orders. -Adjustment parameters are given for hypo and hyperglycemia in writing. -Patient is encouraged to call clinic for blood glucose levels less than 70 or above 300 mg /dl. -She does not have a safe oral regimen to treat her diabetes daily due to CKD and lymphedema.  - she will be considered for incretin therapy as appropriate next visit. - Patient specific target  A1c;  LDL, HDL, Triglycerides, and  Waist Circumference were discussed in detail.  2) BP/HTN: Her blood pressure is not controlled to target. Continue current medications including ACEI/ARB. 3) Lipids/HPL:   Uncontrolled with hypertriglyceridemia.   Patient is advised to continue statins. 4)  Weight/Diet: CDE Consult will be initiated , exercise, and detailed carbohydrates information provided.  5) Chronic Care/Health Maintenance:  -she  is on ACEI/ARB and Statin medications and  is encouraged to continue to follow up with Ophthalmology, Dentist,  Podiatrist at least yearly or according to recommendations, and advised to  stay away from smoking. I have recommended yearly flu vaccine and pneumonia vaccination at least every 5 years; moderate intensity exercise for up to 150 minutes weekly; and  sleep for at  least 7 hours a day.  - I advised patient to maintain close follow up with Rosalee Kaufman, PA-C for primary care needs.  - Time spent with the patient: 1 hour, of which >50% was spent in obtaining information about her symptoms, reviewing her previous labs, evaluations, and treatments, counseling her about her currently uncontrolled complicated type 2 diabetes, hyperlipidemia, hypertension, and developing a plan for long term treatment; her  questions were answered to her satisfaction.  Follow up plan: - Return in about 1 week (around 09/06/2017) for follow up with meter and logs- no labs.  Glade Lloyd, MD [facility] [address] [place], [place] [address] Phone: [phone]  Fax: [phone]    08/30/2017, 5:27 PM  This note was partially dictated with voice recognition software. Similar sounding words can be transcribed inadequately or may not  be corrected upon review.

## 2017-09-05 ENCOUNTER — Telehealth: Payer: Self-pay

## 2017-09-05 NOTE — Telephone Encounter (Signed)
Pt states she has had high BG readings.   Date Before breakfast Before lunch Before supper Bedtime  2/16 [phone]  2/17 [phone]  2/18 [phone]            Pt taking: Toujeo 40 units qhs

## 2017-09-05 NOTE — Telephone Encounter (Signed)
Advise to increase Toujeo to 60 units qhs, continue to monitor 4 times a day and report if greater than 300 x 3.  Patient  needs to keep next appointment.

## 2017-09-06 NOTE — Telephone Encounter (Signed)
Pt.notified

## 2017-09-12 ENCOUNTER — Ambulatory Visit: Payer: PPO | Admitting: "Endocrinology

## 2017-09-12 ENCOUNTER — Encounter: Payer: Self-pay | Admitting: "Endocrinology

## 2017-09-12 VITALS — BP 140/82 | HR 64 | Ht 66.0 in | Wt 257.0 lb

## 2017-09-12 DIAGNOSIS — E782 Mixed hyperlipidemia: Secondary | ICD-10-CM | POA: Diagnosis not present

## 2017-09-12 DIAGNOSIS — Z794 Long term (current) use of insulin: Secondary | ICD-10-CM | POA: Diagnosis not present

## 2017-09-12 DIAGNOSIS — E1159 Type 2 diabetes mellitus with other circulatory complications: Secondary | ICD-10-CM

## 2017-09-12 DIAGNOSIS — N184 Chronic kidney disease, stage 4 (severe): Secondary | ICD-10-CM

## 2017-09-12 DIAGNOSIS — I10 Essential (primary) hypertension: Secondary | ICD-10-CM

## 2017-09-12 DIAGNOSIS — E1122 Type 2 diabetes mellitus with diabetic chronic kidney disease: Secondary | ICD-10-CM

## 2017-09-12 MED ORDER — INSULIN GLARGINE 300 UNIT/ML ~~LOC~~ SOPN
70.0000 [IU] | PEN_INJECTOR | Freq: Every day | SUBCUTANEOUS | 2 refills | Status: DC
Start: 2017-09-12 — End: 2017-10-05

## 2017-09-12 MED ORDER — INSULIN ASPART 100 UNIT/ML FLEXPEN: 10.0000 [IU] | PEN_INJECTOR | Freq: Three times a day (TID) | SUBCUTANEOUS | 2 refills | Status: DC

## 2017-09-12 NOTE — Progress Notes (Signed)
Consult Note       09/12/2017, 1:35 PM   Subjective:    Patient ID: Kristina Howell, female    DOB: [birth year].  Kristina Howell is being seen in consultation for management of currently uncontrolled symptomatic diabetes requested by  Rosalee Kaufman, PA-C.   Past Medical History:  Diagnosis Date  . Atrial fibrillation ([place])   . CAD (coronary artery disease)    Multivessel, DES to LAD and RCA 08/2016  . Cardiomyopathy (HCC)    LVEF 45%  . CKD (chronic kidney disease) stage 3, GFR 30-59 ml/min (HCC)   . Essential hypertension   . Gout   . Pancreatitis, recurrent ([place])   . Type 2 diabetes mellitus ([place])    Past Surgical History:  Procedure Laterality Date  . CHOLECYSTECTOMY    . CORONARY STENT INTERVENTION N/A 09/08/2016   Procedure: Coronary Stent Intervention;  Surgeon: Belva Crome, MD;  Location: [facility];  Service: Cardiovascular;  Laterality: N/A;  . LEFT HEART CATH AND CORONARY ANGIOGRAPHY N/A 09/03/2016   Procedure: Left Heart Cath and Coronary Angiography;  Surgeon: Nelva Bush, MD;  Location: [facility];  Service: Cardiovascular;  Laterality: N/A;  . TONSILLECTOMY     Social History   Socioeconomic History  . Marital status: Married    Spouse name: None  . Number of children: None  . Years of education: None  . Highest education level: None  Social Needs  . Financial resource strain: None  . Food insecurity - worry: None  . Food insecurity - inability: None  . Transportation needs - medical: None  . Transportation needs - non-medical: None  Occupational History  . None  Tobacco Use  . Smoking status: Never Smoker  . Smokeless tobacco: Never Used  Substance and Sexual Activity  . Alcohol use: No  . Drug use: No  . Sexual activity: None  Other Topics Concern  . None  Social History Narrative  . None   Outpatient Encounter Medications as of  09/12/2017  Medication Sig  . allopurinol (ZYLOPRIM) 300 MG tablet Take 300 mg by mouth daily.  . benazepril (LOTENSIN) 40 MG tablet Take 40 mg by mouth daily.  . clopidogrel (PLAVIX) 75 MG tablet take 1 tablet by mouth once daily ---WITH BREAKFAST  . ELIQUIS 2.5 MG TABS tablet take 1 tablet by mouth twice a day  . furosemide (LASIX) 80 MG tablet take 1 tablet by mouth once daily (NOTE: MAY TAKE 1/2 TABLET FOR INCREASED LEG SWELLING OR WEIGHT GAIN)  . insulin aspart (NOVOLOG FLEXPEN) 100 UNIT/ML FlexPen Inject 10-16 Units into the skin 3 (three) times daily with meals.  . Insulin Glargine (TOUJEO SOLOSTAR) 300 UNIT/ML SOPN Inject 70 Units into the skin at bedtime.  . metolazone (ZAROXOLYN) 2.5 MG tablet TAKE ONE TABLET BY MOUTH TWICE A WEEK AS NEEDED FOR LEG SWELLING, PLEASE DO NOT TAKE THIS NO MORE THAN TWICE WEEKLY, ALSO STOP FERROUS SULFA  . metoprolol succinate (TOPROL-XL) 50 MG 24 hr tablet Take 50 mg by mouth at bedtime.  . rosuvastatin (CRESTOR) 20 MG tablet Take 1 tablet (20 mg total) by mouth  daily.  . [DISCONTINUED] Insulin Glargine (TOUJEO SOLOSTAR) 300 UNIT/ML SOPN Inject 70 Units into the skin at bedtime.   No facility-administered encounter medications on file as of 09/12/2017.     ALLERGIES: No Known Allergies  VACCINATION STATUS: Immunization History  Administered Date(s) Administered  . Pneumococcal Polysaccharide-23 09/05/2016    Diabetes  She presents for her follow-up diabetic visit. She has type 2 diabetes mellitus. Onset time: She was diagnosed at approximate age of 72 years. Her disease course has been worsening. There are no hypoglycemic associated symptoms. Pertinent negatives for hypoglycemia include no confusion, headaches, pallor or seizures. Associated symptoms include blurred vision, fatigue, polydipsia and polyuria. Pertinent negatives for diabetes include no chest pain and no polyphagia. There are no hypoglycemic complications. Symptoms are worsening. Diabetic  complications include heart disease, nephropathy, peripheral neuropathy and retinopathy. Risk factors for coronary artery disease include dyslipidemia, diabetes mellitus, family history, obesity, sedentary lifestyle and post-menopausal. Current diabetic treatment includes insulin injections (She is currently on Toujeo 26 units nightly.). Her weight is stable (She has lost approximately 20 pounds over the last year due to  diuresis related to her lymphedema.). She is following a generally unhealthy diet. When asked about meal planning, she reported none. She has not had a previous visit with a dietitian. She never participates in exercise. Her breakfast blood glucose range is generally >200 mg/dl. Her lunch blood glucose range is generally >200 mg/dl. Her dinner blood glucose range is generally >200 mg/dl. Her bedtime blood glucose range is generally >200 mg/dl. Her overall blood glucose range is >200 mg/dl. (  Her recent A1c was 9.9%.) An ACE inhibitor/angiotensin II receptor blocker is being taken. Eye exam is current.  Hypertension  This is a chronic problem. The current episode started more than 1 year ago. The problem is uncontrolled. Associated symptoms include blurred vision. Pertinent negatives include no chest pain, headaches, palpitations or shortness of breath. Risk factors for coronary artery disease include diabetes mellitus, dyslipidemia, family history, obesity, post-menopausal state and sedentary lifestyle. Past treatments include ACE inhibitors. Hypertensive end-organ damage includes CAD/MI and retinopathy.  Hyperlipidemia  This is a chronic problem. The current episode started more than 1 year ago. The problem is uncontrolled. Exacerbating diseases include diabetes and obesity. Pertinent negatives include no chest pain, myalgias or shortness of breath. Risk factors for coronary artery disease include diabetes mellitus, dyslipidemia, obesity, a sedentary lifestyle, post-menopausal and family  history.      Review of Systems  Constitutional: Positive for fatigue. Negative for chills, fever and unexpected weight change.  HENT: Negative for trouble swallowing and voice change.   Eyes: Positive for blurred vision. Negative for visual disturbance.  Respiratory: Negative for cough, shortness of breath and wheezing.   Cardiovascular: Negative for chest pain, palpitations and leg swelling.  Gastrointestinal: Negative for diarrhea, nausea and vomiting.  Endocrine: Positive for polydipsia and polyuria. Negative for cold intolerance, heat intolerance and polyphagia.  Musculoskeletal: Positive for gait problem. Negative for arthralgias and myalgias.  Skin: Negative for color change, pallor, rash and wound.  Neurological: Negative for seizures and headaches.  Psychiatric/Behavioral: Negative for confusion and suicidal ideas.    Objective:    BP 140/82   Pulse 64   Ht 5\' 6"  (1.676 m)   Wt 257 lb (116.6 kg)   BMI 41.48 kg/m   Wt Readings from Last 3 Encounters:  09/12/17 257 lb (116.6 kg)  08/30/17 256 lb (116.1 kg)  08/08/17 257 lb 6.4 oz (116.8 kg)  Physical Exam  Constitutional: She is oriented to person, place, and time. She appears well-developed.  HENT:  Head: Normocephalic and atraumatic.  Eyes: EOM are normal.  Neck: Normal range of motion. Neck supple. No tracheal deviation present. No thyromegaly present.  Cardiovascular: Normal rate and regular rhythm.  Pulmonary/Chest: Effort normal and breath sounds normal.  Abdominal: Soft. Bowel sounds are normal. There is no tenderness. There is no guarding.  Musculoskeletal: Normal range of motion. She exhibits edema.  Large bilateral lower extremities due to lymphedema, pitting and nonpitting edema.  Neurological: She is alert and oriented to person, place, and time. She has normal reflexes. No cranial nerve deficit. Coordination normal.  Skin: Skin is warm and dry. No rash noted. No erythema. No pallor.  Psychiatric:  She has a normal mood and affect. Judgment normal.      CMP ( most recent) CMP     Component Value Date/Time   NA 133 (L) 09/16/2016 0330   K 4.0 09/16/2016 0330   CL 101 09/16/2016 0330   CO2 23 09/16/2016 0330   GLUCOSE 239 (H) 09/16/2016 0330   BUN 47 (A) 02/26/2017   CREATININE 2.1 (A) 02/26/2017   CREATININE 2.28 (H) 09/16/2016 0330   CALCIUM 8.9 09/16/2016 0330   PROT 5.5 (L) 09/03/2016 0031   ALBUMIN 2.5 (L) 09/03/2016 0031   AST 24 09/03/2016 0031   ALT 25 09/03/2016 0031   ALKPHOS 79 09/03/2016 0031   BILITOT 0.4 09/03/2016 0031   GFRNONAA 19 (L) 09/16/2016 0330   GFRAA 22 (L) 09/16/2016 0330     Diabetic Labs (most recent): Lab Results  Component Value Date   HGBA1C 9.9 02/26/2017   HGBA1C 10.3 (H) 08/30/2016     Lipid Panel ( most recent) Lipid Panel     Component Value Date/Time   CHOL 178 02/26/2017   TRIG 318 (A) 02/26/2017   HDL 31 (A) 02/26/2017   CHOLHDL 6.7 08/30/2016 0206   VLDL 26 08/30/2016 0206   LDLCALC 83 02/26/2017      Lab Results  Component Value Date   TSH 4.54 02/26/2017      Assessment & Plan:   1. Type 2 diabetes mellitus with stage 4 chronic kidney disease, coronary artery disease with long-term current use of insulin ([place])  - Kristina Howell has currently uncontrolled symptomatic type 2 DM since 82 years of age. -She presents with persistently and severely elevated blood glucose profile both fasting and postprandial despite titration of her basal insulin to 60 units of Toujeo nightly.    She does not have recent A1c, last documented A1c was 9.9% from August 2018.     -her diabetes is complicated by weight loss and Kristina Howell remains at a high risk for more acute and chronic complications which include CAD, CVA, CKD, retinopathy, and neuropathy. These are all discussed in detail with the patient.  - I have counseled her on diet management and weight loss, by adopting a carbohydrate restricted/protein rich  diet.  -  Suggestion is made for her to avoid simple carbohydrates  from her diet including Cakes, Sweet Desserts / Pastries, Ice Cream, Soda (diet and regular), Sweet Tea, Candies, Chips, Cookies, Store Bought Juices, Alcohol in Excess of  1-2 drinks a day, Artificial Sweeteners, and "Sugar-free" Products. This will help patient to have stable blood glucose profile and potentially avoid unintended weight gain.   - I encouraged her to switch to  unprocessed or minimally processed complex starch and increased protein intake (animal or  plant source), fruits, and vegetables.  - she is advised to stick to a routine mealtimes to eat 3 meals  a day and avoid unnecessary snacks ( to snack only to correct hypoglycemia).   - she will be scheduled with Jearld Fenton, RDN, CDE for individualized diabetes education.  - I have approached her with the following individualized plan to manage diabetes and patient agrees:   -Given her current glycemic burden, she will require intensive treatment with basal/bolus insulin. -He wears CGM device-the Freestyle Libre sensor. -Her daughter is offering help.   - I advised her to increase her Toujeo to 70 units nightly, discussed and initiated NovoLog 10-16 units 3 times daily before meals, while documenting blood glucose strictly 4 times a day-before meals and at bedtime. - Patient is warned not to take insulin without proper monitoring per orders. -Adjustment parameters are given for hypo and hyperglycemia in writing. -Patient is encouraged to call clinic for blood glucose levels less than 70 or above 300 mg /dl. -She does not have a safe oral regimen to treat her diabetes daily due to CKD and lymphedema.  - she will be considered for incretin therapy as appropriate next visit. - Patient specific target  A1c;  LDL, HDL, Triglycerides, and  Waist Circumference were discussed in detail.  2) BP/HTN: Her blood pressure is not controlled to target.   Continue current  medications including ACEI/ARB.  3) Lipids/HPL:   Uncontrolled with hypertriglyceridemia.   Patient is advised to continue statins.  4)  Weight/Diet: CDE Consult will be initiated , exercise, and detailed carbohydrates information provided.  5) Chronic Care/Health Maintenance:  -she  is on ACEI/ARB and Statin medications and  is encouraged to continue to follow up with Ophthalmology, Dentist,  Podiatrist at least yearly or according to recommendations, and advised to  stay away from smoking. I have recommended yearly flu vaccine and pneumonia vaccination at least every 5 years; and  sleep for at least 7 hours a day.  - I advised patient to maintain close follow up with Rosalee Kaufman, PA-C for primary care needs.  - Time spent with the patient: 25 min, of which >50% was spent in reviewing her blood glucose logs , discussing her hypo- and hyper-glycemic episodes, reviewing her current and  previous labs and insulin doses and developing a plan to avoid hypo- and hyper-glycemia. Please refer to Patient Instructions for Blood Glucose Monitoring and Insulin/Medications Dosing Guide"  in media tab for additional information.   Follow up plan: - Return in about 2 weeks (around 09/26/2017) for follow up with meter and logs- no labs.  Glade Lloyd, MD [facility] [address] [place], [place] [address] Phone: [phone]  Fax: [phone]    09/12/2017, 1:35 PM  This note was partially dictated with voice recognition software. Similar sounding words can be transcribed inadequately or may not  be corrected upon review.

## 2017-09-12 NOTE — Patient Instructions (Signed)

## 2017-10-05 ENCOUNTER — Encounter: Payer: Self-pay | Admitting: Nutrition

## 2017-10-05 ENCOUNTER — Encounter: Payer: Self-pay | Admitting: "Endocrinology

## 2017-10-05 ENCOUNTER — Ambulatory Visit: Payer: PPO | Admitting: "Endocrinology

## 2017-10-05 ENCOUNTER — Encounter: Payer: PPO | Attending: Physician Assistant | Admitting: Nutrition

## 2017-10-05 VITALS — BP 147/90 | HR 81 | Ht 66.0 in | Wt 265.0 lb

## 2017-10-05 VITALS — Ht 65.0 in | Wt 265.0 lb

## 2017-10-05 DIAGNOSIS — I10 Essential (primary) hypertension: Secondary | ICD-10-CM

## 2017-10-05 DIAGNOSIS — Z713 Dietary counseling and surveillance: Secondary | ICD-10-CM | POA: Insufficient documentation

## 2017-10-05 DIAGNOSIS — E1122 Type 2 diabetes mellitus with diabetic chronic kidney disease: Secondary | ICD-10-CM | POA: Insufficient documentation

## 2017-10-05 DIAGNOSIS — E782 Mixed hyperlipidemia: Secondary | ICD-10-CM | POA: Diagnosis not present

## 2017-10-05 DIAGNOSIS — E118 Type 2 diabetes mellitus with unspecified complications: Secondary | ICD-10-CM

## 2017-10-05 DIAGNOSIS — N184 Chronic kidney disease, stage 4 (severe): Secondary | ICD-10-CM | POA: Diagnosis not present

## 2017-10-05 DIAGNOSIS — Z794 Long term (current) use of insulin: Secondary | ICD-10-CM | POA: Diagnosis not present

## 2017-10-05 DIAGNOSIS — IMO0002 Reserved for concepts with insufficient information to code with codable children: Secondary | ICD-10-CM

## 2017-10-05 DIAGNOSIS — E1165 Type 2 diabetes mellitus with hyperglycemia: Secondary | ICD-10-CM

## 2017-10-05 MED ORDER — INSULIN GLARGINE 300 UNIT/ML ~~LOC~~ SOPN: 60.0000 [IU] | PEN_INJECTOR | Freq: Every day | SUBCUTANEOUS | 2 refills | Status: DC

## 2017-10-05 NOTE — Patient Instructions (Signed)

## 2017-10-05 NOTE — Progress Notes (Signed)
Consult Note       10/05/2017, 11:39 AM   Subjective:    Patient ID: Kristina Howell, female    DOB: 25-Sep-1935.  Marketa Larin is being seen in consultation for management of currently uncontrolled symptomatic diabetes requested by  Rosalee Kaufman, PA-C.   Past Medical History:  Diagnosis Date  . Atrial fibrillation ([place])   . CAD (coronary artery disease)    Multivessel, DES to LAD and RCA 08/2016  . Cardiomyopathy ([facility])    LVEF 45%  . CKD (chronic kidney disease) stage 3, GFR 30-59 ml/min ([facility])   . Essential hypertension   . Gout   . Pancreatitis, recurrent ([place])   . Type 2 diabetes mellitus ([place])    Past Surgical History:  Procedure Laterality Date  . CHOLECYSTECTOMY    . CORONARY STENT INTERVENTION N/A 09/08/2016   Procedure: Coronary Stent Intervention;  Surgeon: Belva Crome, MD;  Location: [facility];  Service: Cardiovascular;  Laterality: N/A;  . LEFT HEART CATH AND CORONARY ANGIOGRAPHY N/A 09/03/2016   Procedure: Left Heart Cath and Coronary Angiography;  Surgeon: Nelva Bush, MD;  Location: [facility];  Service: Cardiovascular;  Laterality: N/A;  . TONSILLECTOMY     Social History   Socioeconomic History  . Marital status: Married    Spouse name: None  . Number of children: None  . Years of education: None  . Highest education level: None  Social Needs  . Financial resource strain: None  . Food insecurity - worry: None  . Food insecurity - inability: None  . Transportation needs - medical: None  . Transportation needs - non-medical: None  Occupational History  . None  Tobacco Use  . Smoking status: Never Smoker  . Smokeless tobacco: Never Used  Substance and Sexual Activity  . Alcohol use: No  . Drug use: No  . Sexual activity: None  Other Topics Concern  . None  Social History Narrative  . None   Outpatient Encounter Medications as of  10/05/2017  Medication Sig  . allopurinol (ZYLOPRIM) 300 MG tablet Take 300 mg by mouth daily.  . benazepril (LOTENSIN) 40 MG tablet Take 40 mg by mouth daily.  . clopidogrel (PLAVIX) 75 MG tablet take 1 tablet by mouth once daily ---WITH BREAKFAST  . ELIQUIS 2.5 MG TABS tablet take 1 tablet by mouth twice a day  . furosemide (LASIX) 80 MG tablet take 1 tablet by mouth once daily (NOTE: MAY TAKE 1/2 TABLET FOR INCREASED LEG SWELLING OR WEIGHT GAIN)  . insulin aspart (NOVOLOG FLEXPEN) 100 UNIT/ML FlexPen Inject 10-16 Units into the skin 3 (three) times daily with meals.  . Insulin Glargine (TOUJEO SOLOSTAR) 300 UNIT/ML SOPN Inject 60 Units into the skin at bedtime.  . metolazone (ZAROXOLYN) 2.5 MG tablet TAKE ONE TABLET BY MOUTH TWICE A WEEK AS NEEDED FOR LEG SWELLING, PLEASE DO NOT TAKE THIS NO MORE THAN TWICE WEEKLY, ALSO STOP FERROUS SULFA  . metoprolol succinate (TOPROL-XL) 50 MG 24 hr tablet Take 50 mg by mouth at bedtime.  . rosuvastatin (CRESTOR) 20 MG tablet Take 1 tablet (20 mg total) by mouth  daily.  . [DISCONTINUED] Insulin Glargine (TOUJEO SOLOSTAR) 300 UNIT/ML SOPN Inject 70 Units into the skin at bedtime.   No facility-administered encounter medications on file as of 10/05/2017.     ALLERGIES: No Known Allergies  VACCINATION STATUS: Immunization History  Administered Date(s) Administered  . Pneumococcal Polysaccharide-23 09/05/2016    Diabetes  She presents for her follow-up diabetic visit. She has type 2 diabetes mellitus. Onset time: She was diagnosed at approximate age of 82 years. Her disease course has been improving. There are no hypoglycemic associated symptoms. Pertinent negatives for hypoglycemia include no confusion, headaches, pallor or seizures. Associated symptoms include blurred vision and fatigue. Pertinent negatives for diabetes include no chest pain, no polydipsia, no polyphagia and no polyuria. There are no hypoglycemic complications. Symptoms are improving.  Diabetic complications include heart disease, nephropathy, peripheral neuropathy and retinopathy. Risk factors for coronary artery disease include dyslipidemia, diabetes mellitus, family history, obesity, sedentary lifestyle and post-menopausal. Current diabetic treatment includes insulin injections (She is currently on Toujeo 26 units nightly.). Her weight is increasing steadily (She has lost approximately 20 pounds over the last year due to  diuresis related to her lymphedema.). She is following a generally unhealthy diet. When asked about meal planning, she reported none. She has not had a previous visit with a dietitian. She never participates in exercise. Her breakfast blood glucose range is generally 140-180 mg/dl. Her lunch blood glucose range is generally 140-180 mg/dl. Her dinner blood glucose range is generally 140-180 mg/dl. Her bedtime blood glucose range is generally 140-180 mg/dl. Her overall blood glucose range is 140-180 mg/dl. (  Her  A1c was 9.9% in August 2018.  She is due for repeat labs.) An ACE inhibitor/angiotensin II receptor blocker is being taken. Eye exam is current.  Hypertension  This is a chronic problem. The current episode started more than 1 year ago. The problem is uncontrolled. Associated symptoms include blurred vision. Pertinent negatives include no chest pain, headaches, palpitations or shortness of breath. Risk factors for coronary artery disease include diabetes mellitus, dyslipidemia, family history, obesity, post-menopausal state and sedentary lifestyle. Past treatments include ACE inhibitors. Hypertensive end-organ damage includes CAD/MI and retinopathy.  Hyperlipidemia  This is a chronic problem. The current episode started more than 1 year ago. The problem is uncontrolled. Exacerbating diseases include diabetes and obesity. Pertinent negatives include no chest pain, myalgias or shortness of breath. Risk factors for coronary artery disease include diabetes mellitus,  dyslipidemia, obesity, a sedentary lifestyle, post-menopausal and family history.    Review of Systems  Constitutional: Positive for fatigue. Negative for chills, fever and unexpected weight change.  HENT: Negative for trouble swallowing and voice change.   Eyes: Positive for blurred vision. Negative for visual disturbance.  Respiratory: Negative for cough, shortness of breath and wheezing.   Cardiovascular: Negative for chest pain, palpitations and leg swelling.  Gastrointestinal: Negative for diarrhea, nausea and vomiting.  Endocrine: Negative for cold intolerance, heat intolerance, polydipsia, polyphagia and polyuria.  Musculoskeletal: Positive for gait problem. Negative for arthralgias and myalgias.  Skin: Negative for color change, pallor, rash and wound.  Neurological: Negative for seizures and headaches.  Psychiatric/Behavioral: Negative for confusion and suicidal ideas.    Objective:    BP (!) 147/90   Pulse 81   Ht 5\' 6"  (1.676 m)   Wt 265 lb (120.2 kg)   BMI 42.77 kg/m   Wt Readings from Last 3 Encounters:  10/05/17 265 lb (120.2 kg)  10/05/17 265 lb (120.2 kg)  09/12/17 257  lb (116.6 kg)     Physical Exam  Constitutional: She is oriented to person, place, and time. She appears well-developed.  HENT:  Head: Normocephalic and atraumatic.  Eyes: EOM are normal.  Neck: Normal range of motion. Neck supple. No tracheal deviation present. No thyromegaly present.  Cardiovascular: Normal rate and regular rhythm.  Pulmonary/Chest: Effort normal and breath sounds normal.  Abdominal: Soft. Bowel sounds are normal. There is no tenderness. There is no guarding.  Musculoskeletal: Normal range of motion. She exhibits edema.  Large bilateral lower extremities due to lymphedema, pitting and nonpitting edema.  Neurological: She is alert and oriented to person, place, and time. She has normal reflexes. No cranial nerve deficit. Coordination normal.  Skin: Skin is warm and dry. No  rash noted. No erythema. No pallor.  Psychiatric: She has a normal mood and affect. Judgment normal.      CMP ( most recent) CMP     Component Value Date/Time   NA 133 (L) 09/16/2016 0330   K 4.0 09/16/2016 0330   CL 101 09/16/2016 0330   CO2 23 09/16/2016 0330   GLUCOSE 239 (H) 09/16/2016 0330   BUN 47 (A) 02/26/2017   CREATININE 2.1 (A) 02/26/2017   CREATININE 2.28 (H) 09/16/2016 0330   CALCIUM 8.9 09/16/2016 0330   PROT 5.5 (L) 09/03/2016 0031   ALBUMIN 2.5 (L) 09/03/2016 0031   AST 24 09/03/2016 0031   ALT 25 09/03/2016 0031   ALKPHOS 79 09/03/2016 0031   BILITOT 0.4 09/03/2016 0031   GFRNONAA 19 (L) 09/16/2016 0330   GFRAA 22 (L) 09/16/2016 0330     Diabetic Labs (most recent): Lab Results  Component Value Date   HGBA1C 9.9 02/26/2017   HGBA1C 10.3 (H) 08/30/2016     Lipid Panel ( most recent) Lipid Panel     Component Value Date/Time   CHOL 178 02/26/2017   TRIG 318 (A) 02/26/2017   HDL 31 (A) 02/26/2017   CHOLHDL 6.7 08/30/2016 0206   VLDL 26 08/30/2016 0206   LDLCALC 83 02/26/2017      Lab Results  Component Value Date   TSH 4.54 02/26/2017      Assessment & Plan:   1. Type 2 diabetes mellitus with stage 4 chronic kidney disease, coronary artery disease with long-term current use of insulin ([place])  - Tuyet Sitton has currently uncontrolled symptomatic type 2 DM since 82 years of age. -She presents with significantly improved glycemic profile both fasting and postprandial.      She does not have recent A1c, last documented A1c was 9.9% from August 2018.  She will be sent  for new set of labs today.  -her diabetes is complicated by weight loss and Ahsley Knoedler remains at a high risk for more acute and chronic complications which include CAD, CVA, CKD, retinopathy, and neuropathy. These are all discussed in detail with the patient.  - I have counseled her on diet management and weight loss, by adopting a carbohydrate restricted/protein  rich diet.  -  Suggestion is made for her to avoid simple carbohydrates  from her diet including Cakes, Sweet Desserts / Pastries, Ice Cream, Soda (diet and regular), Sweet Tea, Candies, Chips, Cookies, Store Bought Juices, Alcohol in Excess of  1-2 drinks a day, Artificial Sweeteners, and "Sugar-free" Products. This will help patient to have stable blood glucose profile and potentially avoid unintended weight gain.  - I encouraged her to switch to  unprocessed or minimally processed complex starch and increased protein intake (  animal or plant source), fruits, and vegetables.  - she is advised to stick to a routine mealtimes to eat 3 meals  a day and avoid unnecessary snacks ( to snack only to correct hypoglycemia).   - she has been scheduled with Jearld Fenton, RDN, CDE for individualized diabetes education.  - I have approached her with the following individualized plan to manage diabetes and patient agrees:   -Given her current glycemic burden, she will continue to require intensive treatment with basal/bolus insulin. -She wears CGM device-the Freestyle Libre sensor. -Her daughter is offering to help.   - I advised her to decrease her Toujeo to 60 units nightly, continue NovoLog 10-16 units 3 times daily before meals, while documenting blood glucose strictly 4 times a day-before meals and at bedtime. - Patient is warned not to take insulin without proper monitoring per orders. -Adjustment parameters are given for hypo and hyperglycemia in writing. -Patient is encouraged to call clinic for blood glucose levels less than 70 or above 300 mg /dl. -She does not have a safe oral regimen to treat her diabetes daily due to CKD and lymphedema.  - she will be considered for incretin therapy as appropriate next visit. - Patient specific target  A1c;  LDL, HDL, Triglycerides, and  Waist Circumference were discussed in detail.  2) BP/HTN: Her blood pressure is not controlled to target.  She reports  having taken her medications just before she came to clinic.  I advised her to continue current medications including ACEI/ARB.  3) Lipids/HPL:   Uncontrolled with hypertriglyceridemia.   Patient is advised to continue statins-Crestor 20 mg p.o. nightly.  4)  Weight/Diet: CDE Consult has been initiated , exercise, and detailed carbohydrates information provided.  5) Chronic Care/Health Maintenance:  -she  is on ACEI/ARB and Statin medications and  is encouraged to continue to follow up with Ophthalmology, Dentist,  Podiatrist at least yearly or according to recommendations, and advised to  stay away from smoking. I have recommended yearly flu vaccine and pneumonia vaccination at least every 5 years; and  sleep for at least 7 hours a day. -She cannot exercise optimally due to her body habitus.  - I advised patient to maintain close follow up with Rosalee Kaufman, PA-C for primary care needs.  - Time spent with the patient: 25 min, of which >50% was spent in reviewing her blood glucose logs , discussing her hypo- and hyper-glycemic episodes, reviewing her current and  previous labs and insulin doses and developing a plan to avoid hypo- and hyper-glycemia. Please refer to Patient Instructions for Blood Glucose Monitoring and Insulin/Medications Dosing Guide"  in media tab for additional information. [place] participated in the discussions, expressed understanding, and voiced agreement with the above plans.  All questions were answered to her satisfaction. she is encouraged to contact clinic should she have any questions or concerns prior to her return visit.  Follow up plan: - Return in about 2 weeks (around 10/19/2017) for labs today, meter, and logs.  Glade Lloyd, MD [facility] [address] [place], [place] [address] Phone: [phone]  Fax: [phone]    10/05/2017, 11:39 AM  This note was partially dictated with voice  recognition software. Similar sounding words can be transcribed inadequately or may not  be corrected upon review.

## 2017-10-05 NOTE — Patient Instructions (Addendum)
Goals 1. Folllow My Plate 2. Eat 2-3 carb choices per meal 3. Prevent low blood sugars. Call if BS below 70-2-3 times in a row or in a week. Avoid processed and high salt foods. Limit cheese due to high sodium food.

## 2017-10-05 NOTE — Progress Notes (Signed)
  Medical Nutrition Therapy:  Appt start time: 1100 end time:  1200.   Assessment:  Primary concerns today: DM Type 2 with Stg 4 CKD.  Still lives with her husband. She doesn't cook much anymore. Gets Meals on Wheels for lunch. Sees DR. Nida, Endocrinology. Walks with a walker. Toujeo 60 units a day and 10 units of Novolog with meals. BS are doing much better, BS 80-160's now. Feels better.     Has cut out diet sodas, snacks and processed foods and working on eating more balanced meals at times discussed. Engaged to make changes to improve her healthy and diabetes. Last A1C 9.9% 02/2017. Lipid Panel     Component Value Date/Time   CHOL 178 02/26/2017   TRIG 318 (A) 02/26/2017   HDL 31 (A) 02/26/2017   CHOLHDL 6.7 08/30/2016 0206   VLDL 26 08/30/2016 0206   LDLCALC 83 02/26/2017   CMP Latest Ref Rng & Units 02/26/2017 09/16/2016 09/15/2016  Glucose 65 - 99 mg/dL - 239(H) 137(H)  BUN 4 - 21 47(A) 47(H) 46(H)  Creatinine 0.5 - 1.1 2.1(A) 2.28(H) 2.36(H)  Sodium 135 - 145 mmol/L - 133(L) 134(L)  Potassium 3.5 - 5.1 mmol/L - 4.0 3.8  Chloride 101 - 111 mmol/L - 101 103  CO2 22 - 32 mmol/L - 23 23  Calcium 8.9 - 10.3 mg/dL - 8.9 9.1  Total Protein 6.5 - 8.1 g/dL - - -  Total Bilirubin 0.3 - 1.2 mg/dL - - -  Alkaline Phos 38 - 126 U/L - - -  AST 15 - 41 U/L - - -  ALT 14 - 54 U/L - - -     Preferred Learning Style:     No preference indicated   Learning Readiness:   Ready  Change in progress   MEDICATIONS:   DIETARY INTAKE:     24-hr recall:  B ( AM): 1 slice toast, egg and sausage or bacon,water  Snk ( AM):   L ( PM):Chicken salad, water or MOW's meals. Snk ( PM): cheese D ( PM): Meat, vegetables, water Snk ( PM): fruit Beverages: water  Usual physical activity: ADL  Estimated energy needs: 1200-1500  calories 170  g carbohydrates 112 g protein 42 g fat  Progress Towards Goal(s):  In progress.   Nutritional Diagnosis:  NB-1.1 Food and nutrition-related  knowledge deficit As related to Diabetes.  As evidenced by A1C 9.9%..    Intervention:  Nutrition and Diabetes education provided on My Plate, CHO counting, meal planning, portion sizes, timing of meals, avoiding snacks between meals unless having a low blood sugar, target ranges for A1C and blood sugars, signs/symptoms and treatment of hyper/hypoglycemia, monitoring blood sugars, taking medications as prescribed, benefits of exercising 30 minutes per day and prevention of complications of DM.  Goals 1. Folllow My Plate 2. Eat 2-3 carb choices per meal 3. Prevent low blood sugars. Call if BS below 70 mg/dl 2-3 times in a row or in a week. Avoid processed and high salt foods. Limit cheese due to high sodium food.  Teaching Method Utilized:  Visual Auditory Hands on  Handouts given during visit include:  The Plate Method   Meal Plan Card   Diabetes Instructions.   Barriers to learning/adherence to lifestyle change: none  Demonstrated degree of understanding via:  Teach Back   Monitoring/Evaluation:  Dietary intake, exercise, meal planning, , and body weight in  2 weeks (s).

## 2017-10-06 LAB — HEMOGLOBIN A1C
Hgb A1c MFr Bld: 11.2 % of total Hgb — ABNORMAL HIGH (ref <5.7)
MEAN PLASMA GLUCOSE: 275 (calc)
eAG (mmol/L): 15.2 (calc)

## 2017-10-06 LAB — TSH: TSH: 2.54 mIU/L (ref 0.40–4.50)

## 2017-10-06 LAB — COMPLETE METABOLIC PANEL WITH GFR
AG Ratio: 1.3 (calc) (ref 1.0–2.5)
ALKALINE PHOSPHATASE (APISO): 96 U/L (ref 33–130)
ALT: 23 U/L (ref 6–29)
AST: 22 U/L (ref 10–35)
Albumin: 3.6 g/dL (ref 3.6–5.1)
BILIRUBIN TOTAL: 0.3 mg/dL (ref 0.2–1.2)
BUN/Creatinine Ratio: 29 (calc) — ABNORMAL HIGH (ref 6–22)
BUN: 74 mg/dL — ABNORMAL HIGH (ref 7–25)
CHLORIDE: 108 mmol/L (ref 98–110)
CO2: 24 mmol/L (ref 20–32)
Calcium: 9.3 mg/dL (ref 8.6–10.4)
Creat: 2.57 mg/dL — ABNORMAL HIGH (ref 0.60–0.93)
GFR, Est African American: 20 mL/min/{1.73_m2} — ABNORMAL LOW (ref >=60)
GFR, Est Non African American: 17 mL/min/{1.73_m2} — ABNORMAL LOW (ref >=60)
GLOBULIN: 2.7 g/dL (ref 1.9–3.7)
Glucose, Bld: 137 mg/dL (ref 65–139)
POTASSIUM: 4.1 mmol/L (ref 3.5–5.3)
SODIUM: 140 mmol/L (ref 135–146)
Total Protein: 6.3 g/dL (ref 6.1–8.1)

## 2017-10-06 LAB — T4, FREE: Free T4: 1 ng/dL (ref 0.8–1.8)

## 2017-10-14 ENCOUNTER — Other Ambulatory Visit: Payer: Self-pay

## 2017-10-14 MED ORDER — PEN NEEDLES 31G X 8 MM MISC: 1.0000 | Freq: Four times a day (QID) | 5 refills | Status: DC

## 2017-10-20 ENCOUNTER — Encounter: Payer: Self-pay | Admitting: "Endocrinology

## 2017-10-20 ENCOUNTER — Ambulatory Visit: Payer: PPO | Admitting: "Endocrinology

## 2017-10-20 ENCOUNTER — Encounter: Payer: PPO | Attending: Physician Assistant | Admitting: Nutrition

## 2017-10-20 VITALS — Wt 265.0 lb

## 2017-10-20 VITALS — BP 113/74 | HR 72 | Ht 66.0 in | Wt 265.0 lb

## 2017-10-20 DIAGNOSIS — I10 Essential (primary) hypertension: Secondary | ICD-10-CM

## 2017-10-20 DIAGNOSIS — E118 Type 2 diabetes mellitus with unspecified complications: Secondary | ICD-10-CM

## 2017-10-20 DIAGNOSIS — E1122 Type 2 diabetes mellitus with diabetic chronic kidney disease: Secondary | ICD-10-CM

## 2017-10-20 DIAGNOSIS — N184 Chronic kidney disease, stage 4 (severe): Secondary | ICD-10-CM | POA: Insufficient documentation

## 2017-10-20 DIAGNOSIS — E782 Mixed hyperlipidemia: Secondary | ICD-10-CM | POA: Diagnosis not present

## 2017-10-20 DIAGNOSIS — Z6841 Body Mass Index (BMI) 40.0 and over, adult: Secondary | ICD-10-CM | POA: Diagnosis not present

## 2017-10-20 DIAGNOSIS — E1159 Type 2 diabetes mellitus with other circulatory complications: Secondary | ICD-10-CM | POA: Diagnosis not present

## 2017-10-20 DIAGNOSIS — IMO0002 Reserved for concepts with insufficient information to code with codable children: Secondary | ICD-10-CM

## 2017-10-20 DIAGNOSIS — E1165 Type 2 diabetes mellitus with hyperglycemia: Secondary | ICD-10-CM

## 2017-10-20 DIAGNOSIS — Z713 Dietary counseling and surveillance: Secondary | ICD-10-CM | POA: Insufficient documentation

## 2017-10-20 DIAGNOSIS — Z794 Long term (current) use of insulin: Secondary | ICD-10-CM | POA: Insufficient documentation

## 2017-10-20 MED ORDER — INSULIN ASPART 100 UNIT/ML FLEXPEN: 5.0000 [IU] | PEN_INJECTOR | Freq: Three times a day (TID) | SUBCUTANEOUS | 2 refills | Status: DC

## 2017-10-20 MED ORDER — DULAGLUTIDE 0.75 MG/0.5ML ~~LOC~~ SOAJ: 0.7500 mg | SUBCUTANEOUS | 2 refills | Status: DC

## 2017-10-20 MED ORDER — INSULIN GLARGINE 300 UNIT/ML ~~LOC~~ SOPN: 50.0000 [IU] | PEN_INJECTOR | Freq: Every day | SUBCUTANEOUS | 2 refills | Status: DC

## 2017-10-20 NOTE — Progress Notes (Signed)
  Medical Nutrition Therapy:  Appt start time: 0981  end time: 1914   Assessment:  Primary concerns today: DM Type 2 with Stg 4 CKD.   Changes made: has been eating more vegetables with meals. Watching portions. BS's are now down in  Novolog  Reduced to 5 units Toujeo 50 now intead of 60 units. Trulicity will start today.   Lipid Panel     Component Value Date/Time   CHOL 178 02/26/2017   TRIG 318 (A) 02/26/2017   HDL 31 (A) 02/26/2017   CHOLHDL 6.7 08/30/2016 0206   VLDL 26 08/30/2016 0206   LDLCALC 83 02/26/2017   CMP Latest Ref Rng & Units 10/05/2017 02/26/2017 09/16/2016  Glucose 65 - 139 mg/dL 137 - 239(H)  BUN 7 - 25 mg/dL 74(H) 47(A) 47(H)  Creatinine 0.60 - 0.93 mg/dL 2.57(H) 2.1(A) 2.28(H)  Sodium 135 - 146 mmol/L 140 - 133(L)  Potassium 3.5 - 5.3 mmol/L 4.1 - 4.0  Chloride 98 - 110 mmol/L 108 - 101  CO2 20 - 32 mmol/L 24 - 23  Calcium 8.6 - 10.4 mg/dL 9.3 - 8.9  Total Protein 6.1 - 8.1 g/dL 6.3 - -  Total Bilirubin 0.2 - 1.2 mg/dL 0.3 - -  Alkaline Phos 38 - 126 U/L - - -  AST 10 - 35 U/L 22 - -  ALT 6 - 29 U/L 23 - -   Lab Results  Component Value Date   HGBA1C 11.2 (H) 10/05/2017      Preferred Learning Style:     No preference indicated   Learning Readiness:   Ready  Change in progress   MEDICATIONS:   DIETARY INTAKE:     24-hr recall:  B ( AM): 1 slice toast and egg and fruit Snk ( AM):   L ( PM): MOW, water Snk ( PM):  D ( PM): Hamburger, butterbeans, and cukes, water Snk ( PM):  Beverages: water  Usual physical activity: ADL  Estimated energy needs: 1200-1500  calories 170  g carbohydrates 112 g protein 42 g fat  Progress Towards Goal(s):  In progress.   Nutritional Diagnosis:  NB-1.1 Food and nutrition-related knowledge deficit As related to Diabetes.  As evidenced by A1C 9.9%..    Intervention:  Nutrition and Diabetes education provided on My Plate, CHO counting, meal planning, portion sizes, timing of meals, avoiding  snacks between meals unless having a low blood sugar, target ranges for A1C and blood sugars, signs/symptoms and treatment of hyper/hypoglycemia, monitoring blood sugars, taking medications as prescribed, benefits of exercising 30 minutes per day and prevention of complications of DM.  Continue to increase fresh fruits and vegetables. Drink only water Avoid snacks Avoid processed food and fast foods.   Teaching Method Utilized:  Visual Auditory Hands on  Handouts given during visit include:  The Plate Method   Meal Plan Card   Diabetes Instructions.   Barriers to learning/adherence to lifestyle change: none  Demonstrated degree of understanding via:  Teach Back   Monitoring/Evaluation:  Dietary intake, exercise, meal planning, , and body weight in 2-3 monthss).

## 2017-10-20 NOTE — Progress Notes (Signed)
Consult Note       10/20/2017, 1:26 PM   Subjective:    Patient ID: Kristina Howell, female    DOB: 03/19/1936.  Kristina Howell is being seen in consultation for management of currently uncontrolled symptomatic diabetes requested by  Kristina Kaufman, PA-C.   Past Medical History:  Diagnosis Date  . Atrial fibrillation ([place])   . CAD (coronary artery disease)    Multivessel, DES to LAD and RCA 08/2016  . Cardiomyopathy (HCC)    LVEF 45%  . CKD (chronic kidney disease) stage 3, GFR 30-59 ml/min (HCC)   . Essential hypertension   . Gout   . Pancreatitis, recurrent   . Type 2 diabetes mellitus ([place])    Past Surgical History:  Procedure Laterality Date  . CHOLECYSTECTOMY    . CORONARY STENT INTERVENTION N/A 09/08/2016   Procedure: Coronary Stent Intervention;  Surgeon: Belva Crome, MD;  Location: [facility];  Service: Cardiovascular;  Laterality: N/A;  . LEFT HEART CATH AND CORONARY ANGIOGRAPHY N/A 09/03/2016   Procedure: Left Heart Cath and Coronary Angiography;  Surgeon: Nelva Bush, MD;  Location: [facility];  Service: Cardiovascular;  Laterality: N/A;  . TONSILLECTOMY     Social History   Socioeconomic History  . Marital status: Married    Spouse name: Not on file  . Number of children: Not on file  . Years of education: Not on file  . Highest education level: Not on file  Occupational History  . Not on file  Social Needs  . Financial resource strain: Not on file  . Food insecurity:    Worry: Not on file    Inability: Not on file  . Transportation needs:    Medical: Not on file    Non-medical: Not on file  Tobacco Use  . Smoking status: Never Smoker  . Smokeless tobacco: Never Used  Substance and Sexual Activity  . Alcohol use: No  . Drug use: No  . Sexual activity: Not on file  Lifestyle  . Physical activity:    Days per week: Not on file    Minutes per  session: Not on file  . Stress: Not on file  Relationships  . Social connections:    Talks on phone: Not on file    Gets together: Not on file    Attends religious service: Not on file    Active member of club or organization: Not on file    Attends meetings of clubs or organizations: Not on file    Relationship status: Not on file  Other Topics Concern  . Not on file  Social History Narrative  . Not on file   Outpatient Encounter Medications as of 10/20/2017  Medication Sig  . allopurinol (ZYLOPRIM) 300 MG tablet Take 300 mg by mouth daily.  . benazepril (LOTENSIN) 40 MG tablet Take 40 mg by mouth daily.  . clopidogrel (PLAVIX) 75 MG tablet take 1 tablet by mouth once daily ---WITH BREAKFAST  . Dulaglutide (TRULICITY) 2.37 SE/8.3TD SOPN Inject 0.75 mg into the skin once a week.  Marland Kitchen ELIQUIS 2.5 MG TABS tablet take 1 tablet  by mouth twice a day  . furosemide (LASIX) 80 MG tablet take 1 tablet by mouth once daily (NOTE: MAY TAKE 1/2 TABLET FOR INCREASED LEG SWELLING OR WEIGHT GAIN)  . insulin aspart (NOVOLOG FLEXPEN) 100 UNIT/ML FlexPen Inject 5-11 Units into the skin 3 (three) times daily with meals.  . Insulin Glargine (TOUJEO SOLOSTAR) 300 UNIT/ML SOPN Inject 50 Units into the skin at bedtime.  . Insulin Pen Needle (PEN NEEDLES) 31G X 8 MM MISC 1 each by Does not apply route 4 (four) times daily.  . metolazone (ZAROXOLYN) 2.5 MG tablet TAKE ONE TABLET BY MOUTH TWICE A WEEK AS NEEDED FOR LEG SWELLING, PLEASE DO NOT TAKE THIS NO MORE THAN TWICE WEEKLY, ALSO STOP FERROUS SULFA  . metoprolol succinate (TOPROL-XL) 50 MG 24 hr tablet Take 50 mg by mouth at bedtime.  . rosuvastatin (CRESTOR) 20 MG tablet Take 1 tablet (20 mg total) by mouth daily.  . [DISCONTINUED] insulin aspart (NOVOLOG FLEXPEN) 100 UNIT/ML FlexPen Inject 10-16 Units into the skin 3 (three) times daily with meals.  . [DISCONTINUED] Insulin Glargine (TOUJEO SOLOSTAR) 300 UNIT/ML SOPN Inject 60 Units into the skin at bedtime.    No facility-administered encounter medications on file as of 10/20/2017.     ALLERGIES: No Known Allergies  VACCINATION STATUS: Immunization History  Administered Date(s) Administered  . Pneumococcal Polysaccharide-23 09/05/2016    Diabetes  She presents for her follow-up diabetic visit. She has type 2 diabetes mellitus. Onset time: She was diagnosed at approximate age of 46 years. Her disease course has been improving. There are no hypoglycemic associated symptoms. Pertinent negatives for hypoglycemia include no confusion, headaches, pallor or seizures. Associated symptoms include blurred vision and fatigue. Pertinent negatives for diabetes include no chest pain, no polydipsia, no polyphagia and no polyuria. There are no hypoglycemic complications. Symptoms are improving. Diabetic complications include heart disease, nephropathy, peripheral neuropathy and retinopathy. Risk factors for coronary artery disease include dyslipidemia, diabetes mellitus, family history, obesity, sedentary lifestyle and post-menopausal. Current diabetic treatment includes insulin injections (She is currently on Toujeo 26 units nightly.). Her weight is increasing steadily (She has lost approximately 20 pounds over the last year due to  diuresis related to her lymphedema.). She is following a generally unhealthy diet. When asked about meal planning, she reported none. She has not had a previous visit with a dietitian. She never participates in exercise. Her breakfast blood glucose range is generally 130-140 mg/dl. Her lunch blood glucose range is generally 130-140 mg/dl. Her dinner blood glucose range is generally 140-180 mg/dl. Her bedtime blood glucose range is generally 140-180 mg/dl. Her overall blood glucose range is 140-180 mg/dl. (  Her  A1c was 9.9% in August 2018.  She is due for repeat labs.) An ACE inhibitor/angiotensin II receptor blocker is being taken. Eye exam is current.  Hypertension  This is a chronic  problem. The current episode started more than 1 year ago. The problem is uncontrolled. Associated symptoms include blurred vision. Pertinent negatives include no chest pain, headaches, palpitations or shortness of breath. Risk factors for coronary artery disease include diabetes mellitus, dyslipidemia, family history, obesity, post-menopausal state and sedentary lifestyle. Past treatments include ACE inhibitors. Hypertensive end-organ damage includes CAD/MI and retinopathy.  Hyperlipidemia  This is a chronic problem. The current episode started more than 1 year ago. The problem is uncontrolled. Exacerbating diseases include diabetes and obesity. Pertinent negatives include no chest pain, myalgias or shortness of breath. Risk factors for coronary artery disease include diabetes mellitus, dyslipidemia,  obesity, a sedentary lifestyle, post-menopausal and family history.    Review of Systems  Constitutional: Positive for fatigue. Negative for chills, fever and unexpected weight change.  HENT: Negative for trouble swallowing and voice change.   Eyes: Positive for blurred vision. Negative for visual disturbance.  Respiratory: Negative for cough, shortness of breath and wheezing.   Cardiovascular: Negative for chest pain, palpitations and leg swelling.  Gastrointestinal: Negative for diarrhea, nausea and vomiting.  Endocrine: Negative for cold intolerance, heat intolerance, polydipsia, polyphagia and polyuria.  Musculoskeletal: Positive for gait problem. Negative for arthralgias and myalgias.  Skin: Negative for color change, pallor, rash and wound.  Neurological: Negative for seizures and headaches.  Psychiatric/Behavioral: Negative for confusion and suicidal ideas.    Objective:    BP 113/74   Pulse 72   Ht 5\' 6"  (1.676 m)   Wt 265 lb (120.2 kg)   BMI 42.77 kg/m   Wt Readings from Last 3 Encounters:  10/20/17 265 lb (120.2 kg)  10/05/17 265 lb (120.2 kg)  10/05/17 265 lb (120.2 kg)      Physical Exam  Constitutional: She is oriented to person, place, and time. She appears well-developed.  HENT:  Head: Normocephalic and atraumatic.  Eyes: EOM are normal.  Neck: Normal range of motion. Neck supple. No tracheal deviation present. No thyromegaly present.  Cardiovascular: Normal rate.  Pulmonary/Chest: Effort normal.  Abdominal: There is no tenderness. There is no guarding.  Musculoskeletal: Normal range of motion. She exhibits edema.  Large bilateral lower extremities due to lymphedema, pitting and nonpitting edema.  Neurological: She is alert and oriented to person, place, and time. She has normal reflexes. No cranial nerve deficit. Coordination normal.  Skin: Skin is warm and dry. No rash noted. No erythema. No pallor.  Psychiatric: She has a normal mood and affect. Judgment normal.      CMP ( most recent) CMP     Component Value Date/Time   NA 140 10/05/2017 1209   K 4.1 10/05/2017 1209   CL 108 10/05/2017 1209   CO2 24 10/05/2017 1209   GLUCOSE 137 10/05/2017 1209   BUN 74 (H) 10/05/2017 1209   BUN 47 (A) 02/26/2017   CREATININE 2.57 (H) 10/05/2017 1209   CALCIUM 9.3 10/05/2017 1209   PROT 6.3 10/05/2017 1209   ALBUMIN 2.5 (L) 09/03/2016 0031   AST 22 10/05/2017 1209   ALT 23 10/05/2017 1209   ALKPHOS 79 09/03/2016 0031   BILITOT 0.3 10/05/2017 1209   GFRNONAA 17 (L) 10/05/2017 1209   GFRAA 20 (L) 10/05/2017 1209     Diabetic Labs (most recent): Lab Results  Component Value Date   HGBA1C 11.2 (H) 10/05/2017   HGBA1C 9.9 02/26/2017   HGBA1C 10.3 (H) 08/30/2016     Lipid Panel ( most recent) Lipid Panel     Component Value Date/Time   CHOL 178 02/26/2017   TRIG 318 (A) 02/26/2017   HDL 31 (A) 02/26/2017   CHOLHDL 6.7 08/30/2016 0206   VLDL 26 08/30/2016 0206   LDLCALC 83 02/26/2017      Lab Results  Component Value Date   TSH 2.54 10/05/2017   TSH 4.54 02/26/2017   FREET4 1.0 10/05/2017      Assessment & Plan:   1. Type 2  diabetes mellitus with stage 4 chronic kidney disease, coronary artery disease with long-term current use of insulin ([place])  - Kristina Howell has currently uncontrolled symptomatic type 2 DM since 82 years of age. -She presents with significantly  improved glycemic profile both fasting and postprandial.    -Her recent labs show A1c of 11.2%.  -her diabetes is complicated by obesity /sedentary life and Kristina Howell remains at a high risk for more acute and chronic complications which include CAD, CVA, CKD, retinopathy, and neuropathy. These are all discussed in detail with the patient.  - I have counseled her on diet management and weight loss, by adopting a carbohydrate restricted/protein rich diet.  -  Suggestion is made for her to avoid simple carbohydrates  from her diet including Cakes, Sweet Desserts / Pastries, Ice Cream, Soda (diet and regular), Sweet Tea, Candies, Chips, Cookies, Store Bought Juices, Alcohol in Excess of  1-2 drinks a day, Artificial Sweeteners, and "Sugar-free" Products. This will help patient to have stable blood glucose profile and potentially avoid unintended weight gain.  - I encouraged her to switch to  unprocessed or minimally processed complex starch and increased protein intake (animal or plant source), fruits, and vegetables.  - she is advised to stick to a routine mealtimes to eat 3 meals  a day and avoid unnecessary snacks ( to snack only to correct hypoglycemia).   - she has been scheduled with Jearld Fenton, RDN, CDE for individualized diabetes education.  - I have approached her with the following individualized plan to manage diabetes and patient agrees:   -She has responded to the basal/bolus insulin regimen.   -She wears CGM device-the Freestyle Libre sensor. -Her daughter is offering to help.   - I advised her to decrease her Toujeo to 50 units nightly, increase NovoLog to 5-11 units 3 times daily before meals, while documenting blood glucose  strictly 4 times a day-before meals and at bedtime. - Patient is warned not to take insulin without proper monitoring per orders. -Adjustment parameters are given for hypo and hyperglycemia in writing. -Patient is encouraged to call clinic for blood glucose levels less than 70 or above 300 mg /dl. -She does not have a safe oral regimen to treat her diabetes daily due to CKD and lymphedema.  -I discussed and initiated a low-dose Trulicity 7.41 mg subcutaneously weekly to advance as tolerated.   - Patient specific target  A1c;  LDL, HDL, Triglycerides, and  Waist Circumference were discussed in detail.  2) BP/HTN: Her blood pressure is controlled to target.    I advised her to continue current medications including ACEI/ARB.  3) Lipids/HPL:   Uncontrolled with hypertriglyceridemia.   Patient is advised to continue statins-Crestor 20 mg p.o. nightly.  4)  Weight/Diet: CDE Consult has been initiated , exercise, and detailed carbohydrates information provided.  5) Chronic Care/Health Maintenance:  -she  is on ACEI/ARB and Statin medications and  is encouraged to continue to follow up with Ophthalmology, Dentist,  Podiatrist at least yearly or according to recommendations, and advised to  stay away from smoking. I have recommended yearly flu vaccine and pneumonia vaccination at least every 5 years; and  sleep for at least 7 hours a day. -She cannot exercise optimally due to her body habitus.  - I advised patient to maintain close follow up with Kristina Kaufman, PA-C for primary care needs.  - Time spent with the patient: 25 min, of which >50% was spent in reviewing her blood glucose logs , discussing her hypo- and hyper-glycemic episodes, reviewing her current and  previous labs and insulin doses and developing a plan to avoid hypo- and hyper-glycemia. Please refer to Patient Instructions for Blood Glucose Monitoring and Insulin/Medications Dosing Guide"  in media tab for additional  information. Kristina Howell participated in the discussions, expressed understanding, and voiced agreement with the above plans.  All questions were answered to her satisfaction. she is encouraged to contact clinic should she have any questions or concerns prior to her return visit.  Follow up plan: - Return in about 3 months (around 01/19/2018) for follow up with pre-visit labs, meter, and logs.  Glade Lloyd, MD [facility] [address] [place], [place] [address] Phone: [phone]  Fax: [phone]    10/20/2017, 1:26 PM  This note was partially dictated with voice recognition software. Similar sounding words can be transcribed inadequately or may not  be corrected upon review.

## 2017-10-20 NOTE — Patient Instructions (Signed)

## 2017-10-21 ENCOUNTER — Other Ambulatory Visit: Payer: Self-pay | Admitting: *Deleted

## 2017-10-21 MED ORDER — FUROSEMIDE 80 MG PO TABS: ORAL_TABLET | ORAL | 0 refills | Status: DC

## 2017-10-21 MED ORDER — CLOPIDOGREL BISULFATE 75 MG PO TABS: 75.0000 mg | ORAL_TABLET | Freq: Every day | ORAL | 0 refills | Status: DC

## 2017-10-26 DIAGNOSIS — D509 Iron deficiency anemia, unspecified: Secondary | ICD-10-CM | POA: Diagnosis not present

## 2017-10-26 DIAGNOSIS — I129 Hypertensive chronic kidney disease with stage 1 through stage 4 chronic kidney disease, or unspecified chronic kidney disease: Secondary | ICD-10-CM | POA: Diagnosis not present

## 2017-10-26 DIAGNOSIS — E559 Vitamin D deficiency, unspecified: Secondary | ICD-10-CM | POA: Diagnosis not present

## 2017-10-26 DIAGNOSIS — Z79899 Other long term (current) drug therapy: Secondary | ICD-10-CM | POA: Diagnosis not present

## 2017-10-26 DIAGNOSIS — N183 Chronic kidney disease, stage 3 (moderate): Secondary | ICD-10-CM | POA: Diagnosis not present

## 2017-10-26 DIAGNOSIS — R809 Proteinuria, unspecified: Secondary | ICD-10-CM | POA: Diagnosis not present

## 2017-10-27 ENCOUNTER — Encounter: Payer: Self-pay | Admitting: Nutrition

## 2017-10-27 NOTE — Patient Instructions (Signed)
Continue to increase fresh fruits and vegetables. Drink only water Avoid snacks Avoid processed food and fast foods.

## 2017-11-01 DIAGNOSIS — D649 Anemia, unspecified: Secondary | ICD-10-CM | POA: Diagnosis not present

## 2017-11-01 DIAGNOSIS — R809 Proteinuria, unspecified: Secondary | ICD-10-CM | POA: Diagnosis not present

## 2017-11-01 DIAGNOSIS — N184 Chronic kidney disease, stage 4 (severe): Secondary | ICD-10-CM | POA: Diagnosis not present

## 2017-11-01 DIAGNOSIS — E1129 Type 2 diabetes mellitus with other diabetic kidney complication: Secondary | ICD-10-CM | POA: Diagnosis not present

## 2017-11-01 DIAGNOSIS — I509 Heart failure, unspecified: Secondary | ICD-10-CM | POA: Diagnosis not present

## 2017-11-01 DIAGNOSIS — I10 Essential (primary) hypertension: Secondary | ICD-10-CM | POA: Diagnosis not present

## 2017-11-10 ENCOUNTER — Telehealth: Payer: Self-pay | Admitting: Cardiology

## 2017-11-10 NOTE — Telephone Encounter (Signed)
Patient called stating that she has upcoming appointment in July with Dr. Domenic Polite. She was checking to see if she is suppose to have blood test before this visit.

## 2017-11-10 NOTE — Telephone Encounter (Signed)
Pt aware that per LOV she doesn't need any labs prior to July appt with Dr Domenic Polite

## 2017-12-21 ENCOUNTER — Other Ambulatory Visit: Payer: Self-pay | Admitting: "Endocrinology

## 2018-01-18 ENCOUNTER — Encounter: Payer: Self-pay | Admitting: "Endocrinology

## 2018-01-18 DIAGNOSIS — E559 Vitamin D deficiency, unspecified: Secondary | ICD-10-CM | POA: Diagnosis not present

## 2018-01-18 DIAGNOSIS — Z794 Long term (current) use of insulin: Secondary | ICD-10-CM | POA: Diagnosis not present

## 2018-01-18 DIAGNOSIS — D509 Iron deficiency anemia, unspecified: Secondary | ICD-10-CM | POA: Diagnosis not present

## 2018-01-18 DIAGNOSIS — E1122 Type 2 diabetes mellitus with diabetic chronic kidney disease: Secondary | ICD-10-CM | POA: Diagnosis not present

## 2018-01-18 DIAGNOSIS — N184 Chronic kidney disease, stage 4 (severe): Secondary | ICD-10-CM | POA: Diagnosis not present

## 2018-01-18 DIAGNOSIS — N183 Chronic kidney disease, stage 3 (moderate): Secondary | ICD-10-CM | POA: Diagnosis not present

## 2018-01-18 DIAGNOSIS — R809 Proteinuria, unspecified: Secondary | ICD-10-CM | POA: Diagnosis not present

## 2018-01-18 DIAGNOSIS — I129 Hypertensive chronic kidney disease with stage 1 through stage 4 chronic kidney disease, or unspecified chronic kidney disease: Secondary | ICD-10-CM | POA: Diagnosis not present

## 2018-01-18 DIAGNOSIS — Z79899 Other long term (current) drug therapy: Secondary | ICD-10-CM | POA: Diagnosis not present

## 2018-01-18 LAB — HEMOGLOBIN A1C: HEMOGLOBIN A1C: 9.1

## 2018-01-18 LAB — VITAMIN D 25 HYDROXY (VIT D DEFICIENCY, FRACTURES): VIT D 25 HYDROXY: 22

## 2018-01-18 LAB — BASIC METABOLIC PANEL
BUN: 45 — AB (ref 4–21)
Creatinine: 2 — AB (ref 0.5–1.1)

## 2018-01-25 ENCOUNTER — Encounter: Payer: PPO | Attending: Physician Assistant | Admitting: Nutrition

## 2018-01-25 ENCOUNTER — Encounter: Payer: Self-pay | Admitting: "Endocrinology

## 2018-01-25 ENCOUNTER — Ambulatory Visit: Payer: PPO | Admitting: "Endocrinology

## 2018-01-25 VITALS — BP 123/74 | HR 71 | Wt 259.0 lb

## 2018-01-25 DIAGNOSIS — Z794 Long term (current) use of insulin: Secondary | ICD-10-CM

## 2018-01-25 DIAGNOSIS — N184 Chronic kidney disease, stage 4 (severe): Secondary | ICD-10-CM | POA: Diagnosis not present

## 2018-01-25 DIAGNOSIS — Z713 Dietary counseling and surveillance: Secondary | ICD-10-CM | POA: Insufficient documentation

## 2018-01-25 DIAGNOSIS — E1122 Type 2 diabetes mellitus with diabetic chronic kidney disease: Secondary | ICD-10-CM | POA: Diagnosis not present

## 2018-01-25 DIAGNOSIS — I10 Essential (primary) hypertension: Secondary | ICD-10-CM

## 2018-01-25 DIAGNOSIS — E782 Mixed hyperlipidemia: Secondary | ICD-10-CM

## 2018-01-25 MED ORDER — INSULIN ASPART 100 UNIT/ML FLEXPEN: 6.0000 [IU] | PEN_INJECTOR | Freq: Three times a day (TID) | SUBCUTANEOUS | 2 refills | Status: DC

## 2018-01-25 NOTE — Patient Instructions (Signed)

## 2018-01-25 NOTE — Progress Notes (Signed)
Consult Note       01/25/2018, 12:19 PM   Subjective:    Patient ID: Kristina Howell, female    DOB: 1935-12-19.  Kristina Howell is being seen in consultation for management of currently uncontrolled symptomatic diabetes requested by  Rosalee Kaufman, PA-C.   Past Medical History:  Diagnosis Date  . Atrial fibrillation ([place])   . CAD (coronary artery disease)    Multivessel, DES to LAD and RCA 08/2016  . Cardiomyopathy (HCC)    LVEF 45%  . CKD (chronic kidney disease) stage 3, GFR 30-59 ml/min (HCC)   . Essential hypertension   . Gout   . Pancreatitis, recurrent   . Type 2 diabetes mellitus ([place])    Past Surgical History:  Procedure Laterality Date  . CHOLECYSTECTOMY    . CORONARY STENT INTERVENTION N/A 09/08/2016   Procedure: Coronary Stent Intervention;  Surgeon: Belva Crome, MD;  Location: [facility];  Service: Cardiovascular;  Laterality: N/A;  . LEFT HEART CATH AND CORONARY ANGIOGRAPHY N/A 09/03/2016   Procedure: Left Heart Cath and Coronary Angiography;  Surgeon: Nelva Bush, MD;  Location: [facility];  Service: Cardiovascular;  Laterality: N/A;  . TONSILLECTOMY     Social History   Socioeconomic History  . Marital status: Married    Spouse name: Not on file  . Number of children: Not on file  . Years of education: Not on file  . Highest education level: Not on file  Occupational History  . Not on file  Social Needs  . Financial resource strain: Not on file  . Food insecurity:    Worry: Not on file    Inability: Not on file  . Transportation needs:    Medical: Not on file    Non-medical: Not on file  Tobacco Use  . Smoking status: Never Smoker  . Smokeless tobacco: Never Used  Substance and Sexual Activity  . Alcohol use: No  . Drug use: No  . Sexual activity: Not on file  Lifestyle  . Physical activity:    Days per week: Not on file    Minutes per  session: Not on file  . Stress: Not on file  Relationships  . Social connections:    Talks on phone: Not on file    Gets together: Not on file    Attends religious service: Not on file    Active member of club or organization: Not on file    Attends meetings of clubs or organizations: Not on file    Relationship status: Not on file  Other Topics Concern  . Not on file  Social History Narrative  . Not on file   Outpatient Encounter Medications as of 01/25/2018  Medication Sig  . allopurinol (ZYLOPRIM) 300 MG tablet Take 300 mg by mouth daily.  . benazepril (LOTENSIN) 40 MG tablet Take 40 mg by mouth daily.  . clopidogrel (PLAVIX) 75 MG tablet Take 1 tablet (75 mg total) by mouth daily.  Marland Kitchen ELIQUIS 2.5 MG TABS tablet take 1 tablet by mouth twice a day  . furosemide (LASIX) 80 MG tablet take 1 tablet by  mouth once daily (NOTE: MAY TAKE 1/2 TABLET FOR INCREASED LEG SWELLING OR WEIGHT GAIN)  . insulin aspart (NOVOLOG FLEXPEN) 100 UNIT/ML FlexPen Inject 6-12 Units into the skin 3 (three) times daily with meals.  . Insulin Pen Needle (PEN NEEDLES) 31G X 8 MM MISC 1 each by Does not apply route 4 (four) times daily.  . metolazone (ZAROXOLYN) 2.5 MG tablet TAKE ONE TABLET BY MOUTH TWICE A WEEK AS NEEDED FOR LEG SWELLING, PLEASE DO NOT TAKE THIS NO MORE THAN TWICE WEEKLY, ALSO STOP FERROUS SULFA  . metoprolol succinate (TOPROL-XL) 50 MG 24 hr tablet Take 50 mg by mouth at bedtime.  . rosuvastatin (CRESTOR) 20 MG tablet Take 1 tablet (20 mg total) by mouth daily.  Nelva Nay SOLOSTAR 300 UNIT/ML SOPN INJECT 60 UNITS INTO THE SKIN AT BEDTIME  . [DISCONTINUED] Dulaglutide (TRULICITY) 8.11 BJ/4.7WG SOPN Inject 0.75 mg into the skin once a week.  . [DISCONTINUED] insulin aspart (NOVOLOG FLEXPEN) 100 UNIT/ML FlexPen Inject 5-11 Units into the skin 3 (three) times daily with meals.  . [DISCONTINUED] Insulin Glargine (TOUJEO SOLOSTAR) 300 UNIT/ML SOPN Inject 50 Units into the skin at bedtime.   No  facility-administered encounter medications on file as of 01/25/2018.     ALLERGIES: No Known Allergies  VACCINATION STATUS: Immunization History  Administered Date(s) Administered  . Pneumococcal Polysaccharide-23 09/05/2016    Diabetes  She presents for her follow-up diabetic visit. She has type 2 diabetes mellitus. Onset time: She was diagnosed at approximate age of 44 years. Her disease course has been improving. There are no hypoglycemic associated symptoms. Pertinent negatives for hypoglycemia include no confusion, headaches, pallor or seizures. Associated symptoms include blurred vision and fatigue. Pertinent negatives for diabetes include no chest pain, no polydipsia, no polyphagia and no polyuria. There are no hypoglycemic complications. Symptoms are improving. Diabetic complications include heart disease, nephropathy, peripheral neuropathy and retinopathy. Risk factors for coronary artery disease include dyslipidemia, diabetes mellitus, family history, obesity, sedentary lifestyle and post-menopausal. Current diabetic treatment includes insulin injections (She is currently on Toujeo 26 units nightly.). Her weight is increasing steadily (She has lost approximately 20 pounds over the last year due to  diuresis related to her lymphedema.). She is following a generally unhealthy diet. When asked about meal planning, she reported none. She has not had a previous visit with a dietitian. She never participates in exercise. Her breakfast blood glucose range is generally 130-140 mg/dl. Her lunch blood glucose range is generally 130-140 mg/dl. Her dinner blood glucose range is generally 140-180 mg/dl. Her bedtime blood glucose range is generally 140-180 mg/dl. Her overall blood glucose range is 140-180 mg/dl. (  Her  A1c was 9.9% in August 2018.  She is due for repeat labs.) An ACE inhibitor/angiotensin II receptor blocker is being taken. Eye exam is current.  Hypertension  This is a chronic problem.  The current episode started more than 1 year ago. The problem is uncontrolled. Associated symptoms include blurred vision. Pertinent negatives include no chest pain, headaches, palpitations or shortness of breath. Risk factors for coronary artery disease include diabetes mellitus, dyslipidemia, family history, obesity, post-menopausal state and sedentary lifestyle. Past treatments include ACE inhibitors. Hypertensive end-organ damage includes CAD/MI and retinopathy.  Hyperlipidemia  This is a chronic problem. The current episode started more than 1 year ago. The problem is uncontrolled. Exacerbating diseases include diabetes and obesity. Pertinent negatives include no chest pain, myalgias or shortness of breath. Risk factors for coronary artery disease include diabetes mellitus, dyslipidemia, obesity,  a sedentary lifestyle, post-menopausal and family history.    Review of Systems  Constitutional: Positive for fatigue. Negative for chills, fever and unexpected weight change.  HENT: Negative for trouble swallowing and voice change.   Eyes: Positive for blurred vision. Negative for visual disturbance.  Respiratory: Negative for cough, shortness of breath and wheezing.   Cardiovascular: Negative for chest pain, palpitations and leg swelling.  Gastrointestinal: Negative for diarrhea, nausea and vomiting.  Endocrine: Negative for cold intolerance, heat intolerance, polydipsia, polyphagia and polyuria.  Musculoskeletal: Positive for gait problem. Negative for arthralgias and myalgias.  Skin: Negative for color change, pallor, rash and wound.  Neurological: Negative for seizures and headaches.  Psychiatric/Behavioral: Negative for confusion and suicidal ideas.    Objective:    BP 123/74   Pulse 71   Wt 259 lb (117.5 kg)   BMI 41.80 kg/m   Wt Readings from Last 3 Encounters:  01/25/18 259 lb (117.5 kg)  10/20/17 265 lb (120.2 kg)  10/20/17 265 lb (120.2 kg)     Physical Exam  Constitutional:  She is oriented to person, place, and time. She appears well-developed.  HENT:  Head: Normocephalic and atraumatic.  Eyes: Pupils are equal, round, and reactive to light.  Neck: Normal range of motion. No tracheal deviation present. No thyromegaly present.  Cardiovascular: Normal rate.  Pulmonary/Chest: Effort normal.  Abdominal: There is no tenderness. There is no guarding.  Musculoskeletal: She exhibits edema.  Large bilateral lower extremities due to lymphedema, pitting and nonpitting edema.  Neurological: She is alert and oriented to person, place, and time. She has normal reflexes. No cranial nerve deficit. Coordination normal.  Skin: Skin is warm and dry. No rash noted. No erythema. No pallor.  Psychiatric: She has a normal mood and affect. Judgment normal.      CMP ( most recent) CMP     Component Value Date/Time   NA 140 10/05/2017 1209   K 4.1 10/05/2017 1209   CL 108 10/05/2017 1209   CO2 24 10/05/2017 1209   GLUCOSE 137 10/05/2017 1209   BUN 45 (A) 01/18/2018   CREATININE 2.0 (A) 01/18/2018   CREATININE 2.57 (H) 10/05/2017 1209   CALCIUM 9.3 10/05/2017 1209   PROT 6.3 10/05/2017 1209   ALBUMIN 2.5 (L) 09/03/2016 0031   AST 22 10/05/2017 1209   ALT 23 10/05/2017 1209   ALKPHOS 79 09/03/2016 0031   BILITOT 0.3 10/05/2017 1209   GFRNONAA 17 (L) 10/05/2017 1209   GFRAA 20 (L) 10/05/2017 1209     Diabetic Labs (most recent): Lab Results  Component Value Date   HGBA1C 9.1 01/18/2018   HGBA1C 11.2 (H) 10/05/2017   HGBA1C 9.9 02/26/2017     Lipid Panel ( most recent) Lipid Panel     Component Value Date/Time   CHOL 178 02/26/2017   TRIG 318 (A) 02/26/2017   HDL 31 (A) 02/26/2017   CHOLHDL 6.7 08/30/2016 0206   VLDL 26 08/30/2016 0206   LDLCALC 83 02/26/2017      Lab Results  Component Value Date   TSH 2.54 10/05/2017   TSH 4.54 02/26/2017   FREET4 1.0 10/05/2017      Assessment & Plan:   1. Type 2 diabetes mellitus with stage 4 chronic kidney  disease, coronary artery disease with long-term current use of insulin ([place])  - London Crandle has currently uncontrolled symptomatic type 2 DM since 82 years of age. -She presents with significantly improved glycemic profile, her A1c at 9.1% improving from 11.2%.  -  Her fasting glycemic profile is still above target.    -her diabetes is complicated by obesity /sedentary life and Jodelle Fausto remains at a high risk for more acute and chronic complications which include CAD, CVA, CKD, retinopathy, and neuropathy. These are all discussed in detail with the patient.  - I have counseled her on diet management and weight loss, by adopting a carbohydrate restricted/protein rich diet.  -  Suggestion is made for her to avoid simple carbohydrates  from her diet including Cakes, Sweet Desserts / Pastries, Ice Cream, Soda (diet and regular), Sweet Tea, Candies, Chips, Cookies, Store Bought Juices, Alcohol in Excess of  1-2 drinks a day, Artificial Sweeteners, and "Sugar-free" Products. This will help patient to have stable blood glucose profile and potentially avoid unintended weight gain.  - I encouraged her to switch to  unprocessed or minimally processed complex starch and increased protein intake (animal or plant source), fruits, and vegetables.  - she is advised to stick to a routine mealtimes to eat 3 meals  a day and avoid unnecessary snacks ( to snack only to correct hypoglycemia).   - she has been scheduled with Jearld Fenton, RDN, CDE for individualized diabetes education.  - I have approached her with the following individualized plan to manage diabetes and patient agrees:   -She has responded to the basal/bolus insulin regimen.   -She wears CGM device-the Freestyle Libre sensor. -Her daughter is offering to help.   - I advised her to increase Toujeo to 60  units nightly, increase NovoLog to 6-12 units 3 times daily before meals, while documenting blood glucose strictly 4 times a  day-before meals and at bedtime. - Patient is warned not to take insulin without proper monitoring per orders. -Adjustment parameters are given for hypo and hyperglycemia in writing. -Patient is encouraged to call clinic for blood glucose levels less than 70 or above 300 mg /dl. -She does not have a safe oral regimen to treat her diabetes daily due to CKD and lymphedema.  -Her insurance did not provide coverage for Trulicity, will discontinue.     - Patient specific target  A1c;  LDL, HDL, Triglycerides, and  Waist Circumference were discussed in detail.  2) BP/HTN: Her blood pressure is controlled to target.     I advised her to continue current medications including benazepril 40 mg p.o. daily.    3) Lipids/HPL:   Uncontrolled with hypertriglyceridemia.   Patient is advised to continue statins-Crestor 20 mg p.o. nightly.  4)  Weight/Diet: CDE Consult has been initiated , exercise, and detailed carbohydrates information provided.  5) Chronic Care/Health Maintenance:  -she  is on ACEI/ARB and Statin medications and  is encouraged to continue to follow up with Ophthalmology, Dentist,  Podiatrist at least yearly or according to recommendations, and advised to  stay away from smoking. I have recommended yearly flu vaccine and pneumonia vaccination at least every 5 years; and  sleep for at least 7 hours a day. -She cannot exercise optimally due to her body habitus.  - I advised patient to maintain close follow up with Rosalee Kaufman, PA-C for primary care needs.  - Time spent with the patient: 25 min, of which >50% was spent in reviewing her blood glucose logs , discussing her hypo- and hyper-glycemic episodes, reviewing her current and  previous labs and insulin doses and developing a plan to avoid hypo- and hyper-glycemia. Please refer to Patient Instructions for Blood Glucose Monitoring and Insulin/Medications Dosing Guide"  in media  tab for additional information. Venesha Busenbark  participated in the discussions, expressed understanding, and voiced agreement with the above plans.  All questions were answered to her satisfaction. she is encouraged to contact clinic should she have any questions or concerns prior to her return visit.   Follow up plan: - Return in about 3 months (around 04/27/2018) for follow up with pre-visit labs, meter, and logs.  Glade Lloyd, MD [facility] [address] [place], [place] [address] Phone: [phone]  Fax: [phone]    01/25/2018, 12:19 PM  This note was partially dictated with voice recognition software. Similar sounding words can be transcribed inadequately or may not  be corrected upon review.

## 2018-01-25 NOTE — Progress Notes (Signed)
  Medical Nutrition Therapy:  Appt start time: 1017  end time: 5102   Assessment:  Primary concerns today: DM Type 2 with Stg 4 CKD.   Changes made: has been eating more vegetables with meals. Watching portions. BS's are now down in am but elevated in evenings. Dr Dorris Fetch increased Novolog  Increased to 6-12 units using the sliding scale Toujeo 60 units daily. Has the libre and getting readings > 4 times per day. Uses sliding scale. No insurance coverage for Trulicity. Making slow progress. Physically deconditioned and limited activity and walking. Walks with a cane. Lost 6 lbs.  Lipid Panel     Component Value Date/Time   CHOL 178 02/26/2017   TRIG 318 (A) 02/26/2017   HDL 31 (A) 02/26/2017   CHOLHDL 6.7 08/30/2016 0206   VLDL 26 08/30/2016 0206   LDLCALC 83 02/26/2017   CMP Latest Ref Rng & Units 01/18/2018 10/05/2017 02/26/2017  Glucose 65 - 139 mg/dL - 137 -  BUN 4 - 21 45(A) 74(H) 47(A)  Creatinine 0.5 - 1.1 2.0(A) 2.57(H) 2.1(A)  Sodium 135 - 146 mmol/L - 140 -  Potassium 3.5 - 5.3 mmol/L - 4.1 -  Chloride 98 - 110 mmol/L - 108 -  CO2 20 - 32 mmol/L - 24 -  Calcium 8.6 - 10.4 mg/dL - 9.3 -  Total Protein 6.1 - 8.1 g/dL - 6.3 -  Total Bilirubin 0.2 - 1.2 mg/dL - 0.3 -  Alkaline Phos 38 - 126 U/L - - -  AST 10 - 35 U/L - 22 -  ALT 6 - 29 U/L - 23 -   Lab Results  Component Value Date   HGBA1C 9.1 01/18/2018      Preferred Learning Style:     No preference indicated   Learning Readiness:   Ready  Change in progress   MEDICATIONS:   DIETARY INTAKE:     24-hr recall:  B ( AM): 1 egg, toast or raisin bran with milk Snk ( AM):   L ( PM): MOW, water Snk ( PM):  D ( PM): Salad and meat, or 1/2 pb sandwich Snk ( PM):  Beverages: water  Usual physical activity: ADL  Estimated energy needs: 1200-1500  calories 170  g carbohydrates 112 g protein 42 g fat  Progress Towards Goal(s):  In progress.   Nutritional Diagnosis:  NB-1.1 Food and  nutrition-related knowledge deficit As related to Diabetes.  As evidenced by A1C 9.9%..    Intervention:  Nutrition and Diabetes education provided on My Plate, CHO counting, meal planning, portion sizes, timing of meals, avoiding snacks between meals unless having a low blood sugar, target ranges for A1C and blood sugars, signs/symptoms and treatment of hyper/hypoglycemia, monitoring blood sugars, taking medications as prescribed, benefits of exercising 30 minutes per day and prevention of complications of DM. Goals Continue to increase lower carb vegetables. Cut out snacks Don't eat past 7 pm.  Lose 2 lbs per month Get A1C down to 8%. Take insulin as prescribed.    Teaching Method Utilized:  Visual Auditory Hands on  Handouts given during visit include:  The Plate Method   Meal Plan Card   Diabetes Instructions.   Barriers to learning/adherence to lifestyle change: none  Demonstrated degree of understanding via:  Teach Back   Monitoring/Evaluation:  Dietary intake, exercise, meal planning, , and body weight in 2-3 monthss).

## 2018-01-26 ENCOUNTER — Encounter: Payer: Self-pay | Admitting: Nutrition

## 2018-01-26 NOTE — Patient Instructions (Signed)
Goals Continue to increase lower carb vegetables. Cut out snacks Don't eat past 7 pm.  Lose 2 lbs per month Get A1C down to 8%. Take insulin as prescribed.

## 2018-01-27 ENCOUNTER — Other Ambulatory Visit: Payer: Self-pay

## 2018-01-27 DIAGNOSIS — I482 Chronic atrial fibrillation: Secondary | ICD-10-CM | POA: Diagnosis not present

## 2018-01-27 DIAGNOSIS — M109 Gout, unspecified: Secondary | ICD-10-CM | POA: Diagnosis not present

## 2018-01-27 DIAGNOSIS — R609 Edema, unspecified: Secondary | ICD-10-CM | POA: Diagnosis not present

## 2018-01-27 DIAGNOSIS — I10 Essential (primary) hypertension: Secondary | ICD-10-CM | POA: Diagnosis not present

## 2018-01-27 DIAGNOSIS — E782 Mixed hyperlipidemia: Secondary | ICD-10-CM | POA: Diagnosis not present

## 2018-01-27 DIAGNOSIS — E1165 Type 2 diabetes mellitus with hyperglycemia: Secondary | ICD-10-CM | POA: Diagnosis not present

## 2018-01-27 DIAGNOSIS — Z6841 Body Mass Index (BMI) 40.0 and over, adult: Secondary | ICD-10-CM | POA: Diagnosis not present

## 2018-01-27 MED ORDER — FUROSEMIDE 80 MG PO TABS: ORAL_TABLET | ORAL | 0 refills | Status: DC

## 2018-01-27 MED ORDER — CLOPIDOGREL BISULFATE 75 MG PO TABS: 75.0000 mg | ORAL_TABLET | Freq: Every day | ORAL | 0 refills | Status: DC

## 2018-01-27 MED ORDER — APIXABAN 2.5 MG PO TABS: 2.5000 mg | ORAL_TABLET | Freq: Two times a day (BID) | ORAL | 0 refills | Status: DC

## 2018-01-31 DIAGNOSIS — N184 Chronic kidney disease, stage 4 (severe): Secondary | ICD-10-CM | POA: Diagnosis not present

## 2018-01-31 DIAGNOSIS — I5030 Unspecified diastolic (congestive) heart failure: Secondary | ICD-10-CM | POA: Diagnosis not present

## 2018-01-31 DIAGNOSIS — M9080 Osteopathy in diseases classified elsewhere, unspecified site: Secondary | ICD-10-CM | POA: Diagnosis not present

## 2018-01-31 DIAGNOSIS — E1121 Type 2 diabetes mellitus with diabetic nephropathy: Secondary | ICD-10-CM | POA: Diagnosis not present

## 2018-01-31 DIAGNOSIS — I10 Essential (primary) hypertension: Secondary | ICD-10-CM | POA: Diagnosis not present

## 2018-01-31 DIAGNOSIS — E889 Metabolic disorder, unspecified: Secondary | ICD-10-CM | POA: Diagnosis not present

## 2018-02-09 NOTE — Progress Notes (Signed)
Cardiology Office Note  Date: 02/13/2018   ID: Kristina Howell, DOB 1935-08-24, MRN [id number]  PCP: Rosine Door  Primary Cardiologist: Rozann Lesches, MD   Chief Complaint  Patient presents with  . Lymphedema    History of Present Illness: Kristina Howell is an 82 y.o. female last seen in January.  She is here today for a follow-up visit.  She states that she has developed increasing leg swelling and lymphedema despite outpatient mechanical compression treatments and use of Lasix.  Her weight has increased about 5 pounds as well.  Recent lab work is outlined below.  She also brought an additional lab work obtained by her nephrologist and endocrinologist recently.  We went over her medications.  She continues on Eliquis, Plavix, Lasix, Toprol-XL, and Crestor.  Past Medical History:  Diagnosis Date  . Atrial fibrillation ([place])   . CAD (coronary artery disease)    Multivessel, DES to LAD and RCA 08/2016  . Cardiomyopathy ([facility])    LVEF 45%  . CKD (chronic kidney disease) stage 3, GFR 30-59 ml/min ([facility])   . Essential hypertension   . Gout   . Pancreatitis, recurrent   . Type 2 diabetes mellitus ([place])     Past Surgical History:  Procedure Laterality Date  . CHOLECYSTECTOMY    . CORONARY STENT INTERVENTION N/A 09/08/2016   Procedure: Coronary Stent Intervention;  Surgeon: Belva Crome, MD;  Location: [facility];  Service: Cardiovascular;  Laterality: N/A;  . LEFT HEART CATH AND CORONARY ANGIOGRAPHY N/A 09/03/2016   Procedure: Left Heart Cath and Coronary Angiography;  Surgeon: Nelva Bush, MD;  Location: [facility];  Service: Cardiovascular;  Laterality: N/A;  . TONSILLECTOMY      Current Outpatient Medications  Medication Sig Dispense Refill  . allopurinol (ZYLOPRIM) 300 MG tablet Take 300 mg by mouth daily.    Marland Kitchen apixaban (ELIQUIS) 2.5 MG TABS tablet Take 1 tablet (2.5 mg total) by mouth 2 (two) times daily. 180 tablet 0  . benazepril  (LOTENSIN) 40 MG tablet Take 40 mg by mouth daily.    . clopidogrel (PLAVIX) 75 MG tablet Take 1 tablet (75 mg total) by mouth daily. 90 tablet 0  . furosemide (LASIX) 80 MG tablet take 1 tablet by mouth once daily (NOTE: MAY TAKE 1/2 TABLET FOR INCREASED LEG SWELLING OR WEIGHT GAIN) 90 tablet 0  . insulin aspart (NOVOLOG FLEXPEN) 100 UNIT/ML FlexPen Inject 6-12 Units into the skin 3 (three) times daily with meals. 5 pen 2  . Insulin Pen Needle (PEN NEEDLES) 31G X 8 MM MISC 1 each by Does not apply route 4 (four) times daily. 150 each 5  . metolazone (ZAROXOLYN) 2.5 MG tablet TAKE ONE TABLET BY MOUTH TWICE A WEEK AS NEEDED FOR LEG SWELLING, PLEASE DO NOT TAKE THIS NO MORE THAN TWICE WEEKLY, ALSO STOP FERROUS SULFA 8 tablet 1  . metoprolol succinate (TOPROL-XL) 50 MG 24 hr tablet Take 50 mg by mouth at bedtime.    . rosuvastatin (CRESTOR) 20 MG tablet Take 1 tablet (20 mg total) by mouth daily. 90 tablet 3  . TOUJEO SOLOSTAR 300 UNIT/ML SOPN INJECT 60 UNITS INTO THE SKIN AT BEDTIME 4.5 mL 2   No current facility-administered medications for this visit.    Allergies:  Patient has no known allergies.   Social History: The patient  reports that she has never smoked. She has never used smokeless tobacco. She reports that she does not drink alcohol or use  drugs.   ROS:  Please see the history of present illness. Otherwise, complete review of systems is positive for fatigue.  All other systems are reviewed and negative.   Physical Exam: VS:  BP (!) 164/88   Pulse 75   Ht 5\' 6"  (1.676 m)   Wt 265 lb 3.2 oz (120.3 kg)   SpO2 98% Comment: on room air  BMI 42.80 kg/m , BMI Body mass index is 42.8 kg/m.  Wt Readings from Last 3 Encounters:  02/13/18 265 lb 3.2 oz (120.3 kg)  01/26/18 259 lb (117.5 kg)  01/25/18 259 lb (117.5 kg)    General: Morbidly obese woman, no distress. HEENT: Conjunctiva and lids normal, oropharynx clear. Neck: Supple, no elevated JVP or carotid bruits, no  thyromegaly. Lungs: Clear to auscultation, nonlabored breathing at rest. Cardiac: Irregularly irregular, no S3, 2/6 systolic murmur. Abdomen: Soft, nontender, bowel sounds present. Extremities: Chronic appearing bilateral leg edema and lymphedema, distal pulses 1+. Skin: Warm and dry. Musculoskeletal: No kyphosis. Neuropsychiatric: Alert and oriented x3, affect grossly appropriate.  ECG: I personally reviewed the tracing from 08/08/2017 which showed atrial fibrillation with nonspecific ST changes.  Recent Labwork: 10/05/2017: ALT 23; AST 22; Potassium 4.1; Sodium 140; TSH 2.54 01/18/2018: BUN 45; Creatinine 2.0     Component Value Date/Time   CHOL 178 02/26/2017   TRIG 318 (A) 02/26/2017   HDL 31 (A) 02/26/2017   CHOLHDL 6.7 08/30/2016 0206   VLDL 26 08/30/2016 0206   LDLCALC 83 02/26/2017  July 2019: Hemoglobin 11.4, platelets 254, hemoglobin A1c 9.1  Other Studies Reviewed Today:  Echocardiogram2/06/2017: Study Conclusions  - Left ventricle: The cavity size was normal. Wall thickness was increased in a pattern of mild LVH. Severe hypokinesis of the mid anteroseptal wall, apical septal wall, true apex, mid to apical anterior wall. Indeterminant diastolic function (atrial fibrillation). The estimated ejection fraction was 45%. - Aortic valve: There was no stenosis. - Mitral valve: Mildly to moderately calcified annulus. There was trivial regurgitation. - Left atrium: The atrium was mildly to moderately dilated. - Right ventricle: The cavity size was normal. Systolic function was normal. - Pulmonary arteries: No complete TR doppler jet so unable to estimate PA systolic pressure. - Systemic veins: IVC measured 2.3 cm with < 50% respirophasic variation, suggesting RA pressure 15 mmHg.  Impressions:  - Normal LV size with mild LV hypertrophy. EF 45% with wall motion abnormalities as described above, in LAD coronary distribution. Normal RV size and  systolic function. No significant valvular abnormalities.  Cardiac catheterization 09/03/2016: Conclusions: 1. Multivessel CAD, including 90% mid LAD stenosis involving small diagonal branch, 70% ostial/proximal ramus lesion, 30% OM stenosis and sequential 70-80% proximal/mid RCA stenoses. 2. Mildly elevated left ventricular filling pressure. 3. Mild to moderate aortic valve gradient, which is suboptimally evaluated due to heart rate variability related to atrial fibrillation. 4. Right radial artery stenosis, which is likely a combination of fixed disease and vasospasm. Stenosis was successfully navigated with angled micropuncture and Versacore wires.  Recommendations: 1. Medical optimization, including further diuresis and close monitoring of renal function. 2. Plan for stage PCI to mid LAD and proximal/mid RCA next week when volume status has improved and renal function has improved/stabilized. 3. Restart heparin infusion 4 hours after TR band removal.  Cardiac PCI 09/08/2016:  CHIP due to renal insufficiency, long diffuse disease in LAD and right coronary, in an elderly diabetic patient felt to be a poor candidate for CABG. Greatest risk is acute kidney injury and access site  bleeding. Contrast during the procedure 160 cc.Small hematoma present above cath site at completion of procedure.  Successful LAD provisional stenting with reduction in 90% stenosis to 0% using a 28 x 2.75 Synergy DES postdilated to 3.0 mm in diameter. TIMI grade 2 flow was noted in the diagonal post procedure as was a case prior to the procedure.  Successful proximal to mid RCA stenting with overlapping 3.5 Promus Premier 24 and 8 mm stents. Stents were postdilated to 4.0 mm reducing stenosis to 0% with TIMI grade 3 flow.  Assessment and Plan:  1.  Bilateral lymphedema.  I have offered to make a referral for her to go back and have additional physical therapy with mechanical compression.  She is having some  transportation difficulties now but will call us back when she is ready.  I have encouraged her to be consistent with her home pump and also diuretics.  2.  CAD status post DES to the LAD and RCA in February of last year.  She continues on Plavix as well as statin therapy, no active angina symptoms.  LVEF 45%.  3.  Permanent atrial fibrillation.  Continue strategy of heart rate control and anticoagulation.  No bleeding problems on Eliquis.  Recent lab work reviewed.  4.  Type 2 diabetes mellitus, hemoglobin A1c down to 8.4 from greater than 11.  She continues to work with endocrinology.  Current medicines were reviewed with the patient today.  Disposition: Follow up in 4 months.  Signed, Satira Sark, MD, [facility] 02/13/2018 3:00 PM    [facility], [place], [place] [address] Phone: [phone]; Fax: [phone]

## 2018-02-13 ENCOUNTER — Ambulatory Visit (INDEPENDENT_AMBULATORY_CARE_PROVIDER_SITE_OTHER): Payer: PPO | Admitting: Cardiology

## 2018-02-13 ENCOUNTER — Encounter: Payer: Self-pay | Admitting: Cardiology

## 2018-02-13 VITALS — BP 164/88 | HR 75 | Ht 66.0 in | Wt 265.2 lb

## 2018-02-13 DIAGNOSIS — E1165 Type 2 diabetes mellitus with hyperglycemia: Secondary | ICD-10-CM

## 2018-02-13 DIAGNOSIS — I4821 Permanent atrial fibrillation: Secondary | ICD-10-CM

## 2018-02-13 DIAGNOSIS — I25119 Atherosclerotic heart disease of native coronary artery with unspecified angina pectoris: Secondary | ICD-10-CM | POA: Diagnosis not present

## 2018-02-13 DIAGNOSIS — I890 Lymphedema, not elsewhere classified: Secondary | ICD-10-CM | POA: Diagnosis not present

## 2018-02-13 DIAGNOSIS — I482 Chronic atrial fibrillation: Secondary | ICD-10-CM

## 2018-02-13 NOTE — Patient Instructions (Signed)
Medication Instructions:  Your physician recommends that you continue on your current medications as directed. Please refer to the Current Medication list given to you today.  Labwork: NONE  Testing/Procedures: NONE  Follow-Up: Your physician recommends that you schedule a follow-up appointment in: 4 MONTHS WITH DR. MCDOWELL  Any Other Special Instructions Will Be Listed Below (If Applicable).  If you need a refill on your cardiac medications before your next appointment, please call your pharmacy. 

## 2018-03-28 ENCOUNTER — Other Ambulatory Visit: Payer: Self-pay | Admitting: *Deleted

## 2018-03-28 MED ORDER — CLOPIDOGREL BISULFATE 75 MG PO TABS: 75.0000 mg | ORAL_TABLET | Freq: Every day | ORAL | 2 refills | Status: DC

## 2018-03-28 MED ORDER — APIXABAN 2.5 MG PO TABS: 2.5000 mg | ORAL_TABLET | Freq: Two times a day (BID) | ORAL | 2 refills | Status: DC

## 2018-03-28 MED ORDER — FUROSEMIDE 80 MG PO TABS: ORAL_TABLET | ORAL | 2 refills | Status: DC

## 2018-03-30 ENCOUNTER — Encounter (HOSPITAL_COMMUNITY): Payer: Self-pay | Admitting: Physical Therapy

## 2018-03-30 NOTE — Therapy (Signed)
[facility] [place], [place], [address] Phone: [phone]   Fax:  [phone]  Patient Details  Name: Kristina Howell MRN: [id number] Date of Birth: 1936-03-25 Referring Provider:  No ref. provider found  Encounter Date: 03/30/2018  PHYSICAL THERAPY DISCHARGE SUMMARY  Visits from Start of Care: 13  Current functional level related to goals / functional outcomes: Unknown as pt did not return for further visits    Remaining deficits: edema Education / Equipment: The importance of wearing garment/using pump everyday. Plan: Patient agrees to discharge.  Patient goals were partially met. Patient is being discharged due to not returning since the last visit.  ?????        [name], PT CLT [phone] 03/30/2018, 8:52 AM  [facility] [place], [place], [address] Phone: [phone]   Fax:  [phone]

## 2018-03-31 ENCOUNTER — Other Ambulatory Visit: Payer: Self-pay | Admitting: *Deleted

## 2018-03-31 MED ORDER — ROSUVASTATIN CALCIUM 20 MG PO TABS: 20.0000 mg | ORAL_TABLET | Freq: Every day | ORAL | 3 refills | Status: DC

## 2018-04-27 ENCOUNTER — Ambulatory Visit: Payer: PPO | Admitting: "Endocrinology

## 2018-04-27 ENCOUNTER — Telehealth: Payer: Self-pay | Admitting: Nutrition

## 2018-04-27 ENCOUNTER — Ambulatory Visit: Payer: PPO | Admitting: Nutrition

## 2018-04-27 NOTE — Telephone Encounter (Signed)
VM left on VM to rscheude appt with me at Dr. Emi Holes next appt. Has appt with Nida tomrrow at 11 am. Bring meter and sheets and labs tomorrow.

## 2018-04-28 ENCOUNTER — Ambulatory Visit: Payer: PPO | Admitting: "Endocrinology

## 2018-05-10 DIAGNOSIS — N184 Chronic kidney disease, stage 4 (severe): Secondary | ICD-10-CM | POA: Diagnosis not present

## 2018-05-10 DIAGNOSIS — E1122 Type 2 diabetes mellitus with diabetic chronic kidney disease: Secondary | ICD-10-CM | POA: Diagnosis not present

## 2018-05-10 DIAGNOSIS — Z794 Long term (current) use of insulin: Secondary | ICD-10-CM | POA: Diagnosis not present

## 2018-05-10 LAB — BASIC METABOLIC PANEL
BUN: 61 — AB (ref 4–21)
Creatinine: 2.4 — AB (ref 0.5–1.1)

## 2018-05-10 LAB — HEMOGLOBIN A1C: Hemoglobin A1C: 8.9

## 2018-05-18 ENCOUNTER — Ambulatory Visit (INDEPENDENT_AMBULATORY_CARE_PROVIDER_SITE_OTHER): Payer: PPO | Admitting: "Endocrinology

## 2018-05-18 ENCOUNTER — Encounter: Payer: Self-pay | Admitting: "Endocrinology

## 2018-05-18 VITALS — BP 135/71 | HR 84 | Ht 66.0 in | Wt 263.0 lb

## 2018-05-18 DIAGNOSIS — E782 Mixed hyperlipidemia: Secondary | ICD-10-CM | POA: Diagnosis not present

## 2018-05-18 DIAGNOSIS — I10 Essential (primary) hypertension: Secondary | ICD-10-CM | POA: Diagnosis not present

## 2018-05-18 DIAGNOSIS — N184 Chronic kidney disease, stage 4 (severe): Secondary | ICD-10-CM | POA: Diagnosis not present

## 2018-05-18 DIAGNOSIS — E1122 Type 2 diabetes mellitus with diabetic chronic kidney disease: Secondary | ICD-10-CM

## 2018-05-18 DIAGNOSIS — Z794 Long term (current) use of insulin: Secondary | ICD-10-CM | POA: Diagnosis not present

## 2018-05-18 MED ORDER — INSULIN ASPART 100 UNIT/ML FLEXPEN: 8.0000 [IU] | PEN_INJECTOR | Freq: Three times a day (TID) | SUBCUTANEOUS | 2 refills | Status: DC

## 2018-05-18 NOTE — Progress Notes (Signed)
Consult Note       05/18/2018, 5:23 PM   Subjective:    Patient ID: Kristina Howell, female    DOB: 1936/05/21.  Kristina Howell is being seen in consultation for management of currently uncontrolled symptomatic diabetes requested by  Rosalee Kaufman, PA-C.   Past Medical History:  Diagnosis Date  . Atrial fibrillation ([place])   . CAD (coronary artery disease)    Multivessel, DES to LAD and RCA 08/2016  . Cardiomyopathy (HCC)    LVEF 45%  . CKD (chronic kidney disease) stage 3, GFR 30-59 ml/min (HCC)   . Essential hypertension   . Gout   . Pancreatitis, recurrent   . Type 2 diabetes mellitus ([place])    Past Surgical History:  Procedure Laterality Date  . CHOLECYSTECTOMY    . CORONARY STENT INTERVENTION N/A 09/08/2016   Procedure: Coronary Stent Intervention;  Surgeon: Belva Crome, MD;  Location: [facility];  Service: Cardiovascular;  Laterality: N/A;  . LEFT HEART CATH AND CORONARY ANGIOGRAPHY N/A 09/03/2016   Procedure: Left Heart Cath and Coronary Angiography;  Surgeon: Nelva Bush, MD;  Location: [facility];  Service: Cardiovascular;  Laterality: N/A;  . TONSILLECTOMY     Social History   Socioeconomic History  . Marital status: Married    Spouse name: Not on file  . Number of children: Not on file  . Years of education: Not on file  . Highest education level: Not on file  Occupational History  . Not on file  Social Needs  . Financial resource strain: Not on file  . Food insecurity:    Worry: Not on file    Inability: Not on file  . Transportation needs:    Medical: Not on file    Non-medical: Not on file  Tobacco Use  . Smoking status: Never Smoker  . Smokeless tobacco: Never Used  Substance and Sexual Activity  . Alcohol use: No  . Drug use: No  . Sexual activity: Not on file  Lifestyle  . Physical activity:    Days per week: Not on file    Minutes per  session: Not on file  . Stress: Not on file  Relationships  . Social connections:    Talks on phone: Not on file    Gets together: Not on file    Attends religious service: Not on file    Active member of club or organization: Not on file    Attends meetings of clubs or organizations: Not on file    Relationship status: Not on file  Other Topics Concern  . Not on file  Social History Narrative  . Not on file   Outpatient Encounter Medications as of 05/18/2018  Medication Sig  . allopurinol (ZYLOPRIM) 300 MG tablet Take 300 mg by mouth daily.  Marland Kitchen apixaban (ELIQUIS) 2.5 MG TABS tablet Take 1 tablet (2.5 mg total) by mouth 2 (two) times daily.  . benazepril (LOTENSIN) 40 MG tablet Take 40 mg by mouth daily.  . clopidogrel (PLAVIX) 75 MG tablet Take 1 tablet (75 mg total) by mouth daily.  . furosemide (LASIX) 80 MG  tablet take 1 tablet by mouth once daily (NOTE: MAY TAKE 1/2 TABLET FOR INCREASED LEG SWELLING OR WEIGHT GAIN)  . insulin aspart (NOVOLOG FLEXPEN) 100 UNIT/ML FlexPen Inject 8-14 Units into the skin 3 (three) times daily with meals.  . Insulin Pen Needle (PEN NEEDLES) 31G X 8 MM MISC 1 each by Does not apply route 4 (four) times daily.  . metolazone (ZAROXOLYN) 2.5 MG tablet TAKE ONE TABLET BY MOUTH TWICE A WEEK AS NEEDED FOR LEG SWELLING, PLEASE DO NOT TAKE THIS NO MORE THAN TWICE WEEKLY, ALSO STOP FERROUS SULFA  . metoprolol succinate (TOPROL-XL) 50 MG 24 hr tablet Take 50 mg by mouth at bedtime.  . rosuvastatin (CRESTOR) 20 MG tablet Take 1 tablet (20 mg total) by mouth daily.  Nelva Nay SOLOSTAR 300 UNIT/ML SOPN INJECT 60 UNITS INTO THE SKIN AT BEDTIME  . [DISCONTINUED] insulin aspart (NOVOLOG FLEXPEN) 100 UNIT/ML FlexPen Inject 6-12 Units into the skin 3 (three) times daily with meals.   No facility-administered encounter medications on file as of 05/18/2018.     ALLERGIES: No Known Allergies  VACCINATION STATUS: Immunization History  Administered Date(s) Administered   . Pneumococcal Polysaccharide-23 09/05/2016    Diabetes  She presents for her follow-up diabetic visit. She has type 2 diabetes mellitus. Onset time: She was diagnosed at approximate age of 29 years. Her disease course has been improving. There are no hypoglycemic associated symptoms. Pertinent negatives for hypoglycemia include no confusion, headaches, pallor or seizures. Associated symptoms include blurred vision and fatigue. Pertinent negatives for diabetes include no chest pain, no polydipsia, no polyphagia and no polyuria. There are no hypoglycemic complications. Symptoms are improving. Diabetic complications include heart disease, nephropathy, peripheral neuropathy and retinopathy. Risk factors for coronary artery disease include dyslipidemia, diabetes mellitus, family history, obesity, sedentary lifestyle and post-menopausal. Current diabetic treatment includes insulin injections (She is currently on Toujeo 26 units nightly.). Her weight is fluctuating minimally (She has lost approximately 20 pounds over the last year due to  diuresis related to her lymphedema.). She is following a generally unhealthy diet. When asked about meal planning, she reported none. She has not had a previous visit with a dietitian. She never participates in exercise. Her breakfast blood glucose range is generally 140-180 mg/dl. Her lunch blood glucose range is generally 180-200 mg/dl. Her dinner blood glucose range is generally 140-180 mg/dl. Her bedtime blood glucose range is generally 180-200 mg/dl. Her overall blood glucose range is 180-200 mg/dl. (  Her  A1c was 9.9% in August 2018.  She is due for repeat labs.) An ACE inhibitor/angiotensin II receptor blocker is being taken. Eye exam is current.  Hypertension  This is a chronic problem. The current episode started more than 1 year ago. The problem is uncontrolled. Associated symptoms include blurred vision. Pertinent negatives include no chest pain, headaches,  palpitations or shortness of breath. Risk factors for coronary artery disease include diabetes mellitus, dyslipidemia, family history, obesity, post-menopausal state and sedentary lifestyle. Past treatments include ACE inhibitors. Hypertensive end-organ damage includes CAD/MI and retinopathy.  Hyperlipidemia  This is a chronic problem. The current episode started more than 1 year ago. The problem is uncontrolled. Exacerbating diseases include diabetes and obesity. Pertinent negatives include no chest pain, myalgias or shortness of breath. Risk factors for coronary artery disease include diabetes mellitus, dyslipidemia, obesity, a sedentary lifestyle, post-menopausal and family history.    Review of Systems  Constitutional: Positive for fatigue. Negative for chills, fever and unexpected weight change.  HENT: Negative for  trouble swallowing and voice change.   Eyes: Positive for blurred vision. Negative for visual disturbance.  Respiratory: Negative for cough, shortness of breath and wheezing.   Cardiovascular: Negative for chest pain, palpitations and leg swelling.  Gastrointestinal: Negative for diarrhea, nausea and vomiting.  Endocrine: Negative for cold intolerance, heat intolerance, polydipsia, polyphagia and polyuria.  Musculoskeletal: Positive for gait problem. Negative for arthralgias and myalgias.  Skin: Negative for color change, pallor, rash and wound.  Neurological: Negative for seizures and headaches.  Psychiatric/Behavioral: Negative for confusion and suicidal ideas.    Objective:    BP 135/71   Pulse 84   Ht 5\' 6"  (1.676 m)   Wt 263 lb (119.3 kg)   BMI 42.45 kg/m   Wt Readings from Last 3 Encounters:  05/18/18 263 lb (119.3 kg)  02/13/18 265 lb 3.2 oz (120.3 kg)  01/26/18 259 lb (117.5 kg)     Physical Exam  Constitutional: She is oriented to person, place, and time. She appears well-developed.  HENT:  Head: Normocephalic and atraumatic.  Eyes: Pupils are equal,  round, and reactive to light.  Neck: Normal range of motion. No tracheal deviation present. No thyromegaly present.  Cardiovascular: Normal rate.  Pulmonary/Chest: Effort normal.  Abdominal: There is no tenderness. There is no guarding.  Musculoskeletal: She exhibits edema.  Large bilateral lower extremities due to lymphedema, pitting and nonpitting edema.  Neurological: She is alert and oriented to person, place, and time. No cranial nerve deficit. Coordination normal.  Skin: Skin is warm and dry. No rash noted. No erythema. No pallor.  Psychiatric: She has a normal mood and affect. Judgment normal.   CMP     Component Value Date/Time   NA 140 10/05/2017 1209   K 4.1 10/05/2017 1209   CL 108 10/05/2017 1209   CO2 24 10/05/2017 1209   GLUCOSE 137 10/05/2017 1209   BUN 61 (A) 05/10/2018   CREATININE 2.4 (A) 05/10/2018   CREATININE 2.57 (H) 10/05/2017 1209   CALCIUM 9.3 10/05/2017 1209   PROT 6.3 10/05/2017 1209   ALBUMIN 2.5 (L) 09/03/2016 0031   AST 22 10/05/2017 1209   ALT 23 10/05/2017 1209   ALKPHOS 79 09/03/2016 0031   BILITOT 0.3 10/05/2017 1209   GFRNONAA 17 (L) 10/05/2017 1209   GFRAA 20 (L) 10/05/2017 1209     Diabetic Labs (most recent): Lab Results  Component Value Date   HGBA1C 8.9 05/10/2018   HGBA1C 9.1 01/18/2018   HGBA1C 11.2 (H) 10/05/2017     Lipid Panel ( most recent) Lipid Panel     Component Value Date/Time   CHOL 178 02/26/2017   TRIG 318 (A) 02/26/2017   HDL 31 (A) 02/26/2017   CHOLHDL 6.7 08/30/2016 0206   VLDL 26 08/30/2016 0206   LDLCALC 83 02/26/2017      Lab Results  Component Value Date   TSH 2.54 10/05/2017   TSH 4.54 02/26/2017   FREET4 1.0 10/05/2017      Assessment & Plan:   1. Type 2 diabetes mellitus with stage 4 chronic kidney disease, coronary artery disease with long-term current use of insulin ([place])  - Kristina Howell has currently uncontrolled symptomatic type 2 DM since 82 years of age. -She presents with  continued improvement in her glycemic profile, A1c at 8.9% progressively improving from 11.2%.    -her diabetes is complicated by advanced renal insufficiency, obesity /sedentary life and Kristina Howell remains at a high risk for more acute and chronic complications which include CAD,  CVA, CKD, retinopathy, and neuropathy. These are all discussed in detail with the patient.  - I have counseled her on diet management and weight loss, by adopting a carbohydrate restricted/protein rich diet.  -She still admits to dietary indiscretions including consumption of sweets and sweetened beverages.  -  Suggestion is made for her to avoid simple carbohydrates  from her diet including Cakes, Sweet Desserts / Pastries, Ice Cream, Soda (diet and regular), Sweet Tea, Candies, Chips, Cookies, Store Bought Juices, Alcohol in Excess of  1-2 drinks a day, Artificial Sweeteners, and "Sugar-free" Products. This will help patient to have stable blood glucose profile and potentially avoid unintended weight gain.  - I encouraged her to switch to  unprocessed or minimally processed complex starch and increased protein intake (animal or plant source), fruits, and vegetables.  - she is advised to stick to a routine mealtimes to eat 3 meals  a day and avoid unnecessary snacks ( to snack only to correct hypoglycemia).   - she has been scheduled with Kristina Howell, Kristina Howell, Kristina Howell for individualized diabetes education.  - I have approached her with the following individualized plan to manage diabetes and patient agrees:   -She has responded to the basal/bolus insulin regimen, and she will continue to require such intensive insulin program in order for her to achieve and maintain control of diabetes to target. -She wears CGM device-the Freestyle Libre sensor.  -She is advised to continue  Toujeo to 60  units nightly, increase NovoLog to 6-14 units 3 times daily before meals, while documenting blood glucose strictly 4 times a  day-before meals and at bedtime. - Patient is warned not to take insulin without proper monitoring per orders. -Adjustment parameters are given for hypo and hyperglycemia in writing. -Patient is encouraged to call clinic for blood glucose levels less than 70 or above 300 mg /dl. -She does not have a safe oral regimen to treat her diabetes daily due to CKD and lymphedema.  -Her insurance did not provide coverage for Trulicity, will discontinue.     - Patient specific target  A1c;  LDL, HDL, Triglycerides, and  Waist Circumference were discussed in detail.  2) BP/HTN: Her blood pressure is controlled to target.  She is advised to continue her current blood pressure medications including benazepril 40 mg p.o. daily with breakfast.  3) Lipids/HPL:   Uncontrolled with hypertriglyceridemia.  She is advised to continue statins-Crestor 20 mg p.o. nightly.  4)  Weight/Diet: Kristina Howell Consult has been initiated , exercise, and detailed carbohydrates information provided.  5) Chronic Care/Health Maintenance:  -she  is on ACEI/ARB and Statin medications and  is encouraged to continue to follow up with Ophthalmology, Dentist, nephrology given stage 4-5 renal insufficiency,  podiatrist at least yearly or according to recommendations, and advised to  stay away from smoking. I have recommended yearly flu vaccine and pneumonia vaccination at least every 5 years; and  sleep for at least 7 hours a day. -She cannot exercise optimally due to her body habitus.  - I advised patient to maintain close follow up with Rosalee Kaufman, PA-C for primary care needs.  - Time spent with the patient: 25 min, of which >50% was spent in reviewing her blood glucose logs , discussing her hypo- and hyper-glycemic episodes, reviewing her current and  previous labs and insulin doses and developing a plan to avoid hypo- and hyper-glycemia. Please refer to Patient Instructions for Blood Glucose Monitoring and Insulin/Medications  Dosing Guide"  in media tab for  additional information. Mariesha Dain participated in the discussions, expressed understanding, and voiced agreement with the above plans.  All questions were answered to her satisfaction. she is encouraged to contact clinic should she have any questions or concerns prior to her return visit.  Follow up plan: - Return in about 6 months (around 11/16/2018) for Follow up with Pre-visit Labs, Meter, and Logs.  Glade Lloyd, MD [facility] [address] [place] [address] Phone: [phone]  Fax: [phone]    05/18/2018, 5:23 PM  This note was partially dictated with voice recognition software. Similar sounding words can be transcribed inadequately or may not  be corrected upon review.

## 2018-05-18 NOTE — Patient Instructions (Signed)

## 2018-05-23 DIAGNOSIS — Z6841 Body Mass Index (BMI) 40.0 and over, adult: Secondary | ICD-10-CM | POA: Diagnosis not present

## 2018-05-23 DIAGNOSIS — Z0001 Encounter for general adult medical examination with abnormal findings: Secondary | ICD-10-CM | POA: Diagnosis not present

## 2018-06-12 NOTE — Progress Notes (Signed)
Cardiology Office Note  Date: 06/13/2018   ID: Kristina Howell, DOB [birth year], MRN [id number]  PCP: Kristina Howell  Primary Cardiologist: Kristina Lesches, MD   Chief Complaint  Patient presents with  . Coronary Artery Disease    History of Present Illness: Kristina Howell is an 82 y.o. female last seen in July.  She is here today for a follow-up visit.  She does not report any active angina symptoms.  Still has significant leg edema and lymphedema, has not wanted a referral back for physical therapy and mechanical compression. She states that this is mainly related to transportation problems.  I reviewed her medications, she continues on diuretics at stable dose, does not use Zaroxolyn very much at all.  She had lab work per endocrinology as outlined below.  Weight is up about 4 pounds overall.  She does not report any bleeding problems on Eliquis and Plavix.  Past Medical History:  Diagnosis Date  . Atrial fibrillation ([place])   . CAD (coronary artery disease)    Multivessel, DES to LAD and RCA 08/2016  . Cardiomyopathy (HCC)    LVEF 45%  . CKD (chronic kidney disease) stage 3, GFR 30-59 ml/min (HCC)   . Essential hypertension   . Gout   . Pancreatitis, recurrent   . Type 2 diabetes mellitus ([place])     Past Surgical History:  Procedure Laterality Date  . CHOLECYSTECTOMY    . CORONARY STENT INTERVENTION N/A 09/08/2016   Procedure: Coronary Stent Intervention;  Surgeon: Kristina Crome, MD;  Location: [facility];  Service: Cardiovascular;  Laterality: N/A;  . LEFT HEART CATH AND CORONARY ANGIOGRAPHY N/A 09/03/2016   Procedure: Left Heart Cath and Coronary Angiography;  Surgeon: Kristina Bush, MD;  Location: [facility];  Service: Cardiovascular;  Laterality: N/A;  . TONSILLECTOMY      Current Outpatient Medications  Medication Sig Dispense Refill  . allopurinol (ZYLOPRIM) 300 MG tablet Take 300 mg by mouth daily.    Marland Kitchen apixaban (ELIQUIS) 2.5 MG  TABS tablet Take 1 tablet (2.5 mg total) by mouth 2 (two) times daily. 180 tablet 2  . benazepril (LOTENSIN) 40 MG tablet Take 40 mg by mouth daily.    . clopidogrel (PLAVIX) 75 MG tablet Take 1 tablet (75 mg total) by mouth daily. 90 tablet 2  . furosemide (LASIX) 80 MG tablet take 1 tablet by mouth once daily (NOTE: MAY TAKE 1/2 TABLET FOR INCREASED LEG SWELLING OR WEIGHT GAIN) 90 tablet 2  . insulin aspart (NOVOLOG FLEXPEN) 100 UNIT/ML FlexPen Inject 8-14 Units into the skin 3 (three) times daily with meals. 5 pen 2  . Insulin Pen Needle (PEN NEEDLES) 31G X 8 MM MISC 1 each by Does not apply route 4 (four) times daily. 150 each 5  . metolazone (ZAROXOLYN) 2.5 MG tablet TAKE ONE TABLET BY MOUTH TWICE A WEEK AS NEEDED FOR LEG SWELLING, PLEASE DO NOT TAKE THIS NO MORE THAN TWICE WEEKLY, ALSO STOP FERROUS SULFA 8 tablet 1  . metoprolol succinate (TOPROL-XL) 50 MG 24 hr tablet Take 50 mg by mouth at bedtime.    . rosuvastatin (CRESTOR) 20 MG tablet Take 1 tablet (20 mg total) by mouth daily. 90 tablet 3  . TOUJEO SOLOSTAR 300 UNIT/ML SOPN INJECT 60 UNITS INTO THE SKIN AT BEDTIME 4.5 mL 2   No current facility-administered medications for this visit.    Allergies:  Patient has no known allergies.   Social History: The patient  reports that she has never smoked. She has never used smokeless tobacco. She reports that she does not drink alcohol or use drugs.   ROS:  Please see the history of present illness. Otherwise, complete review of systems is positive for chronic fatigue.  All other systems are reviewed and negative.   Physical Exam: VS:  BP (!) 152/88   Pulse 78   Ht 5\' 6"  (1.676 m)   Wt 267 lb (121.1 kg)   SpO2 98%   BMI 43.09 kg/m , BMI Body mass index is 43.09 kg/m.  Wt Readings from Last 3 Encounters:  06/13/18 267 lb (121.1 kg)  05/18/18 263 lb (119.3 kg)  02/13/18 265 lb 3.2 oz (120.3 kg)    General: Morbidly obese woman, appears comfortable at rest.  Using a walker. HEENT:  Conjunctiva and lids normal, oropharynx clear. Neck: Supple, no elevated JVP or carotid bruits, no thyromegaly. Lungs: Clear to auscultation, nonlabored breathing at rest. Cardiac: Irregularly irregular, no S3, 2/6 systolic murmur. Abdomen: Soft, nontender, bowel sounds present. Extremities: Bilateral leg edema and lymphedema, distal pulses 1+. Skin: Warm and dry. Musculoskeletal: No kyphosis. Neuropsychiatric: Alert and oriented x3, affect grossly appropriate.  ECG: I personally reviewed the tracing from 08/08/2017 which showed atrial for ablation with nonspecific ST changes.  Recent Labwork: 10/05/2017: ALT 23; AST 22; Potassium 4.1; Sodium 140; TSH 2.54 05/10/2018: BUN 61; Creatinine 2.4, AST 19, ALT 23, potassium 4.1, Hgb A1c 8.9     Component Value Date/Time   CHOL 178 02/26/2017   TRIG 318 (A) 02/26/2017   HDL 31 (A) 02/26/2017   CHOLHDL 6.7 08/30/2016 0206   VLDL 26 08/30/2016 0206   LDLCALC 83 02/26/2017    Other Studies Reviewed Today:  Echocardiogram2/06/2017: Study Conclusions  - Left ventricle: The cavity size was normal. Wall thickness was increased in a pattern of mild LVH. Severe hypokinesis of the mid anteroseptal wall, apical septal wall, true apex, mid to apical anterior wall. Indeterminant diastolic function (atrial fibrillation). The estimated ejection fraction was 45%. - Aortic valve: There was no stenosis. - Mitral valve: Mildly to moderately calcified annulus. There was trivial regurgitation. - Left atrium: The atrium was mildly to moderately dilated. - Right ventricle: The cavity size was normal. Systolic function was normal. - Pulmonary arteries: No complete TR doppler jet so unable to estimate PA systolic pressure. - Systemic veins: IVC measured 2.3 cm with < 50% respirophasic variation, suggesting RA pressure 15 mmHg.  Impressions:  - Normal LV size with mild LV hypertrophy. EF 45% with wall motion abnormalities as  described above, in LAD coronary distribution. Normal RV size and systolic function. No significant valvular abnormalities.  Cardiac catheterization 09/03/2016: Conclusions: 1. Multivessel CAD, including 90% mid LAD stenosis involving small diagonal branch, 70% ostial/proximal ramus lesion, 30% OM stenosis and sequential 70-80% proximal/mid RCA stenoses. 2. Mildly elevated left ventricular filling pressure. 3. Mild to moderate aortic valve gradient, which is suboptimally evaluated due to heart rate variability related to atrial fibrillation. 4. Right radial artery stenosis, which is likely a combination of fixed disease and vasospasm. Stenosis was successfully navigated with angled micropuncture and Versacore wires.  Recommendations: 1. Medical optimization, including further diuresis and close monitoring of renal function. 2. Plan for stage PCI to mid LAD and proximal/mid RCA next week when volume status has improved and renal function has improved/stabilized. 3. Restart heparin infusion 4 hours after TR band removal.  Cardiac PCI 09/08/2016:  CHIP due to renal insufficiency, long diffuse disease in LAD and  right coronary, in an elderly diabetic patient felt to be a poor candidate for CABG. Greatest risk is acute kidney injury and access site bleeding. Contrast during the procedure 160 cc.Small hematoma present above cath site at completion of procedure.  Successful LAD provisional stenting with reduction in 90% stenosis to 0% using a 28 x 2.75 Synergy DES postdilated to 3.0 mm in diameter. TIMI grade 2 flow was noted in the diagonal post procedure as was a case prior to the procedure.  Successful proximal to mid RCA stenting with overlapping 3.5 Promus Premier 24 and 8 mm stents. Stents were postdilated to 4.0 mm reducing stenosis to 0% with TIMI grade 3 flow.  Assessment and Plan:  1.  CAD status post DES to the LAD and RCA in February 2018.  She remains on Plavix and statin  therapy.  2.  Ischemic cardiomyopathy, LVEF approximately 45%.  She remains on Toprol-XL and Lotensin along with diuretic regimen.  3.  CKD stage 3, creatinine 2.4.  4.  Permanent atrial fibrillation.  Continue heart rate control and anticoagulation.  She is tolerating Eliquis 2.5 mg twice daily.  No reported bleeding problems.  5.  Lymphedema.  Patient still declines referral back to the wound clinic for Medicare medical compression.  Current medicines were reviewed with the patient today.  Disposition: Follow-up in 6 months.  Signed, Satira Sark, MD, [facility] 06/13/2018 1:50 PM    [facility] [place], [place], [place] [address] Phone: [phone]; Fax: [phone]

## 2018-06-13 ENCOUNTER — Ambulatory Visit: Payer: PPO | Admitting: Cardiology

## 2018-06-13 ENCOUNTER — Encounter: Payer: Self-pay | Admitting: Cardiology

## 2018-06-13 VITALS — BP 152/88 | HR 78 | Ht 66.0 in | Wt 267.0 lb

## 2018-06-13 DIAGNOSIS — I255 Ischemic cardiomyopathy: Secondary | ICD-10-CM | POA: Diagnosis not present

## 2018-06-13 DIAGNOSIS — N183 Chronic kidney disease, stage 3 unspecified: Secondary | ICD-10-CM

## 2018-06-13 DIAGNOSIS — I25119 Atherosclerotic heart disease of native coronary artery with unspecified angina pectoris: Secondary | ICD-10-CM | POA: Diagnosis not present

## 2018-06-13 DIAGNOSIS — I890 Lymphedema, not elsewhere classified: Secondary | ICD-10-CM | POA: Diagnosis not present

## 2018-06-13 DIAGNOSIS — I4821 Permanent atrial fibrillation: Secondary | ICD-10-CM

## 2018-06-13 NOTE — Patient Instructions (Addendum)

## 2018-07-25 IMAGING — CR DG CHEST 2V
2 series · 2 of 2 positions shown · non-contrast
Comparison: None.

CLINICAL DATA: Shortness of breath for 2 months

EXAM:
CHEST  2 VIEW

[chest pa]
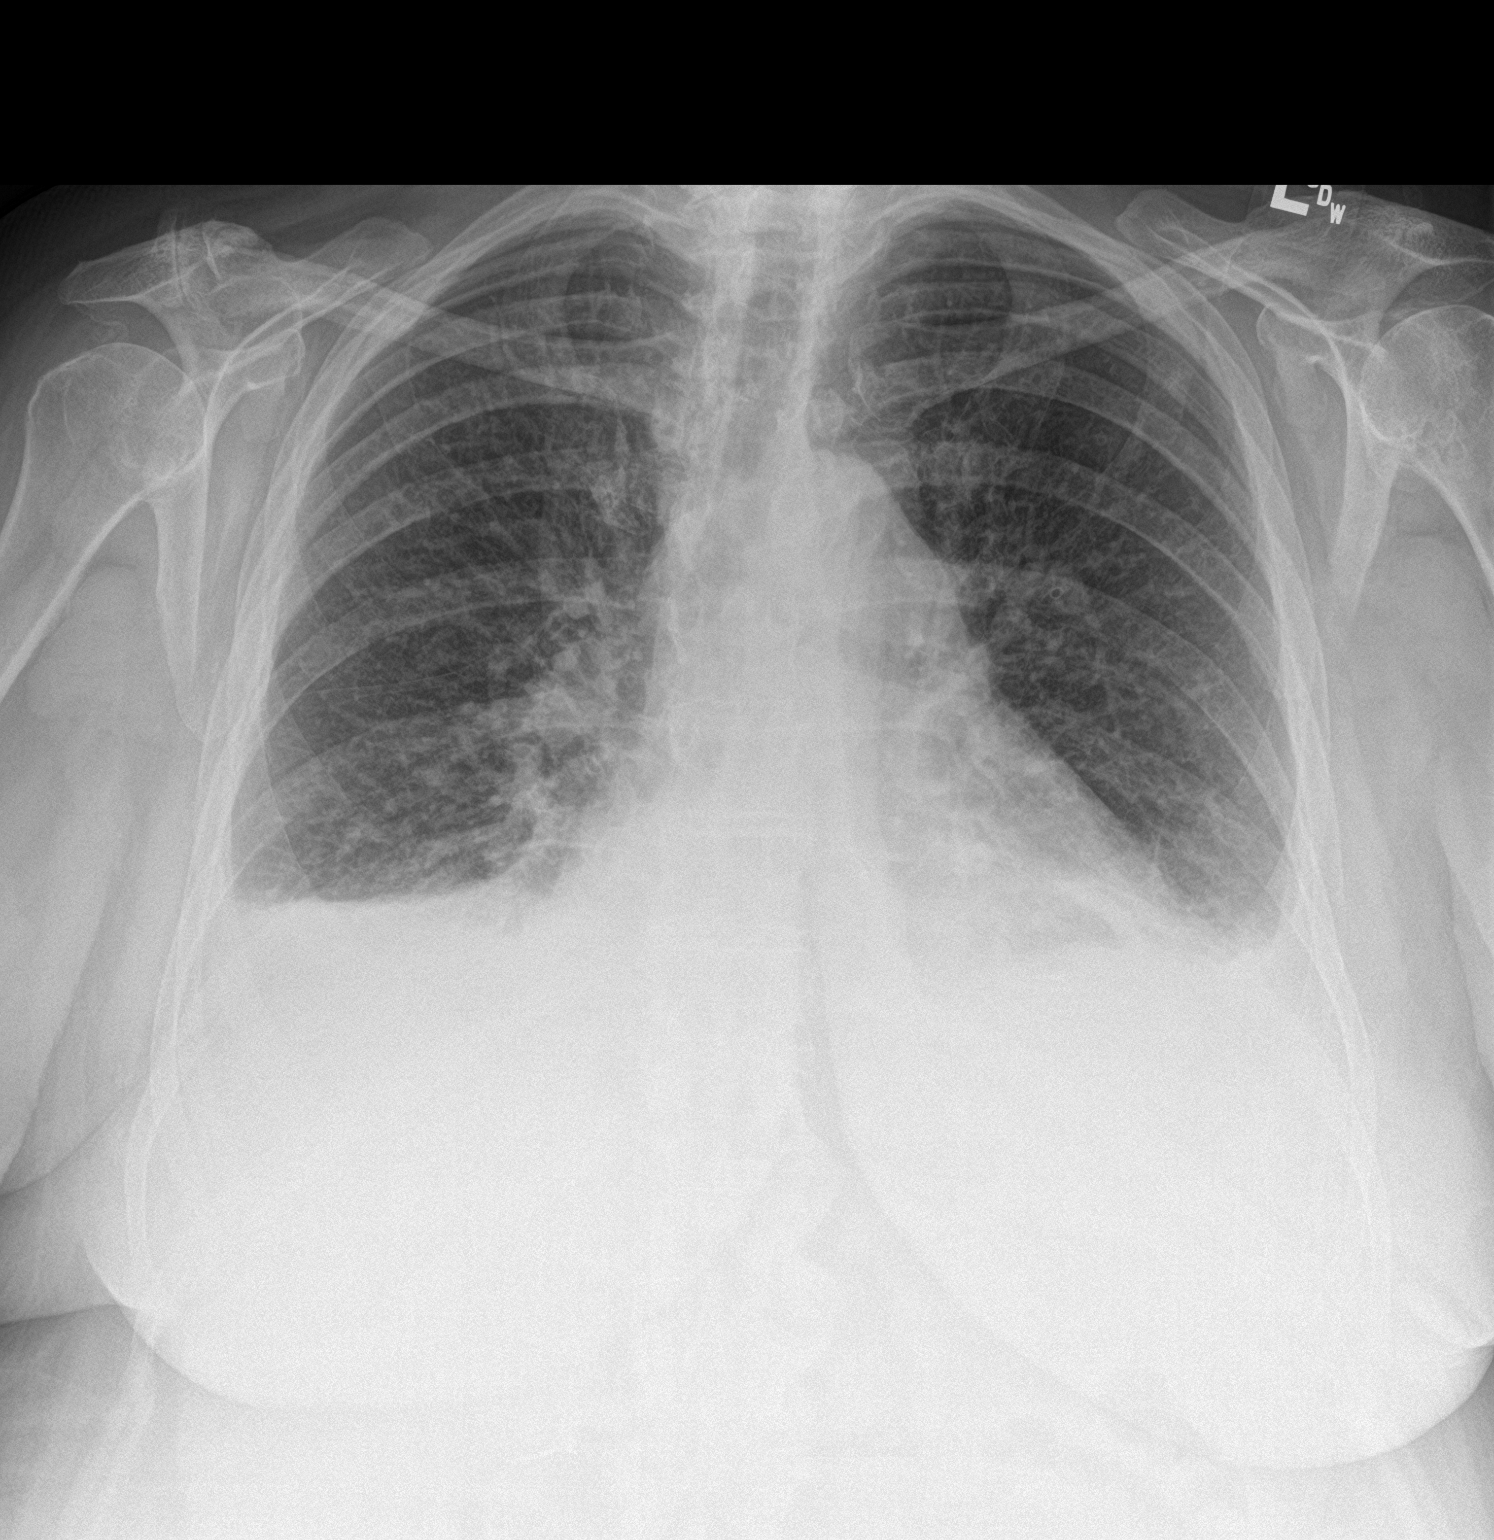

[chest lat]
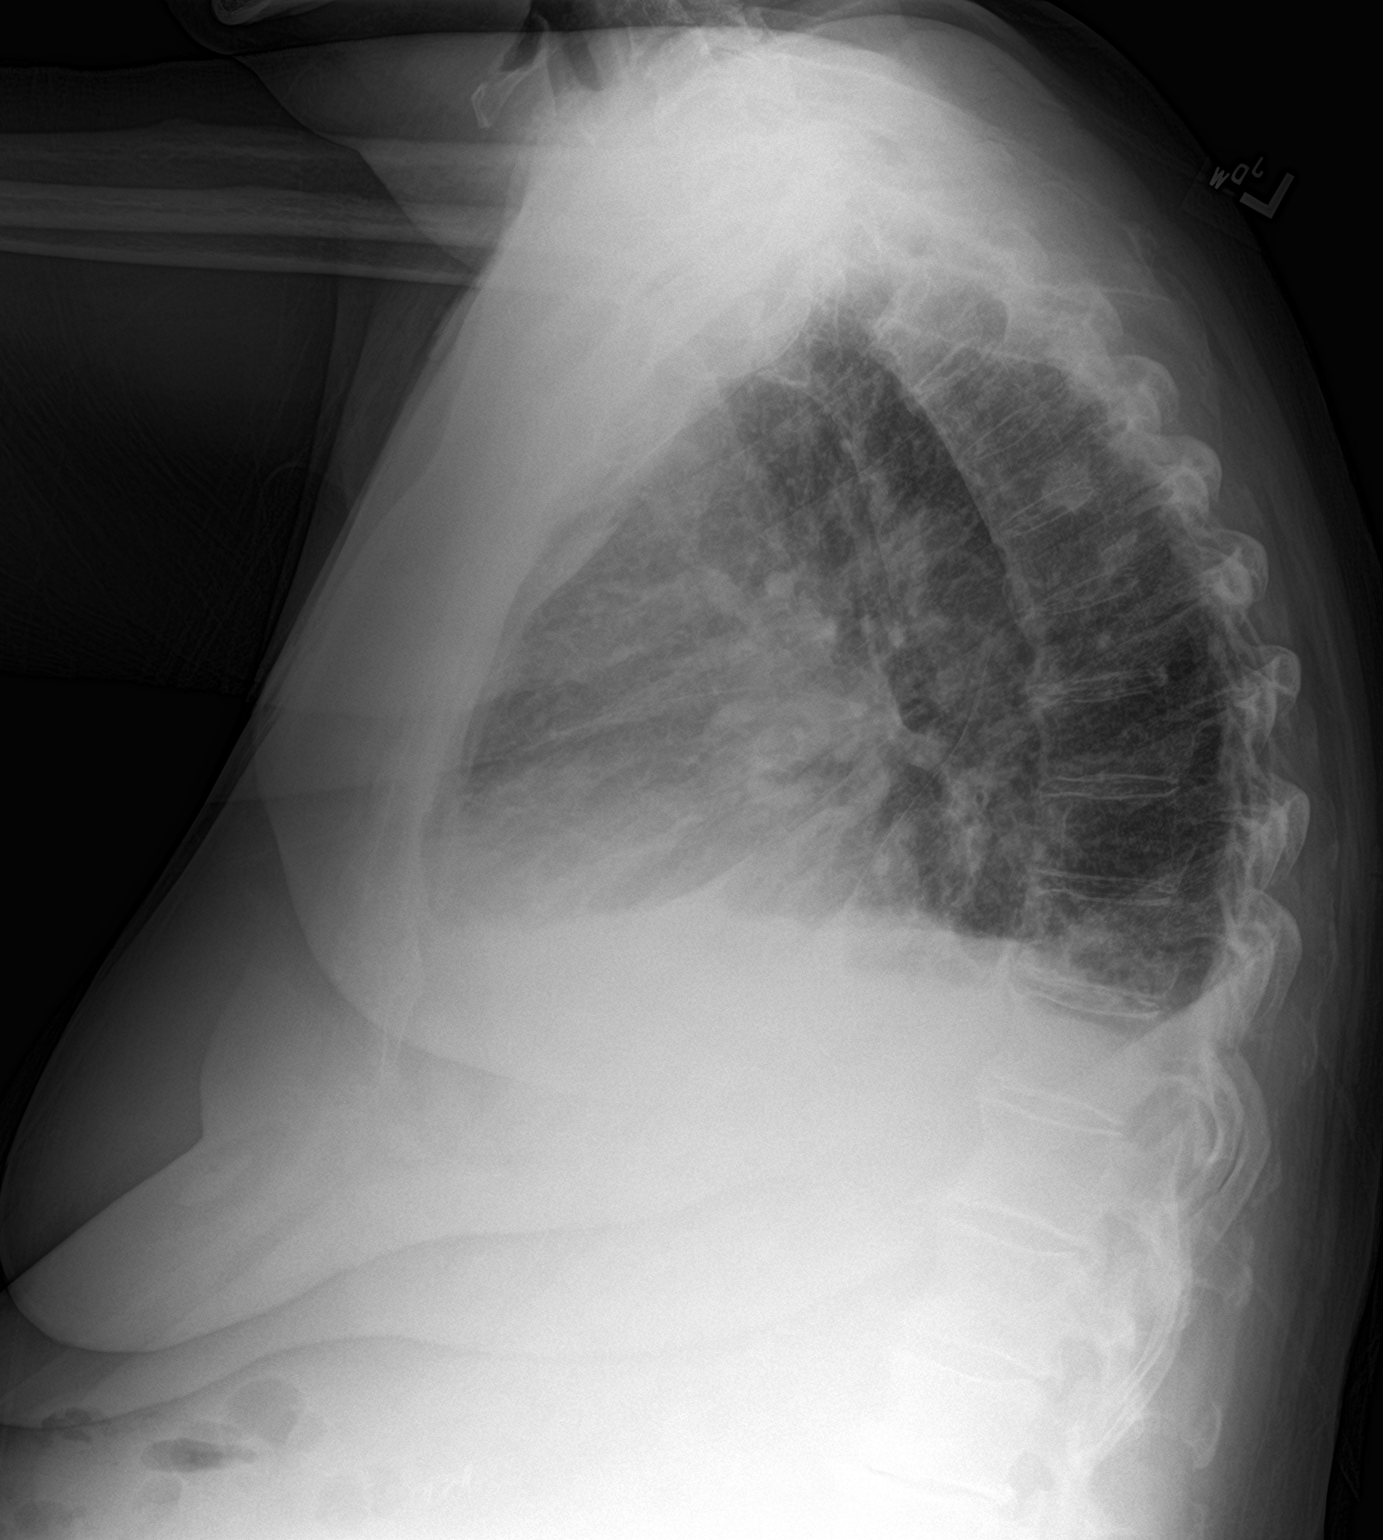

[2 of 2 positions shown; findings below may reference images not displayed]

FINDINGS: Cardiac shadow is at the upper limits of normal in size. Mild
vascular congestion is seen. Small bilateral pleural effusions and
bibasilar atelectatic changes are seen. No bony abnormality is
noted.
IMPRESSION: Mild CHF with bibasilar atelectatic changes and effusions.

## 2018-07-28 ENCOUNTER — Other Ambulatory Visit: Payer: Self-pay | Admitting: "Endocrinology

## 2018-10-20 ENCOUNTER — Other Ambulatory Visit: Payer: Self-pay | Admitting: "Endocrinology

## 2018-11-13 ENCOUNTER — Ambulatory Visit: Payer: PPO | Admitting: "Endocrinology

## 2018-12-01 DIAGNOSIS — E1165 Type 2 diabetes mellitus with hyperglycemia: Secondary | ICD-10-CM | POA: Diagnosis not present

## 2018-12-14 ENCOUNTER — Telehealth: Payer: Self-pay | Admitting: *Deleted

## 2018-12-14 NOTE — Telephone Encounter (Signed)
Patient verbally consented for telehealth visits with [facility] and understands that her insurance company will be billed for the encounter.   Aware to have weight available. Unable to check BP & HR

## 2018-12-17 NOTE — Progress Notes (Signed)
Virtual Visit via Telephone Note   This visit type was conducted due to national recommendations for restrictions regarding the COVID-19 Pandemic (e.g. social distancing) in an effort to limit this patient's exposure and mitigate transmission in our community.  Due to her co-morbid illnesses, this patient is at least at moderate risk for complications without adequate follow up.  This format is felt to be most appropriate for this patient at this time.  The patient did not have access to video technology/had technical difficulties with video requiring transitioning to audio format only (telephone).  All issues noted in this document were discussed and addressed.  No physical exam could be performed with this format.  Please refer to the patient's chart for her  consent to telehealth for [facility].   Date:  12/18/2018   ID:  Kristina Howell, DOB [birth year], MRN [id number]  Patient Location: Home Provider Location: Office  PCP:  Rosalee Kaufman, PA-C  Cardiologist:  Rozann Lesches, MD Electrophysiologist:  None   Evaluation Performed:  Follow-Up Visit  Chief Complaint:   Cardiac follow-up  History of Present Illness:    Kristina Howell is an 83 y.o. female last seen in November 2019.  She did not have video access and we spoke by phone today.  She does not report any active angina symptoms.  States that she has been having significant back trouble, occasionally it "flares up" and she has difficulty ambulating.  She continues to have trouble with lymphedema as well, has not been as consistent with her mechanical compression.  I reviewed her medications which are listed below.  She states that she has been compliant.  Also had lab work with PCP in mid May which we are requesting.  She tells me that her renal function continues to deteriorate.  The patient does not have symptoms concerning for COVID-19 infection (fever, chills, cough, or new shortness of breath).  She and her husband  have both been staying around the house.   Past Medical History:  Diagnosis Date  . Atrial fibrillation ([place])   . CAD (coronary artery disease)    Multivessel, DES to LAD and RCA 08/2016  . Cardiomyopathy (HCC)    LVEF 45%  . CKD (chronic kidney disease) stage 3, GFR 30-59 ml/min (HCC)   . Essential hypertension   . Gout   . Pancreatitis, recurrent   . Type 2 diabetes mellitus ([place])    Past Surgical History:  Procedure Laterality Date  . CHOLECYSTECTOMY    . CORONARY STENT INTERVENTION N/A 09/08/2016   Procedure: Coronary Stent Intervention;  Surgeon: Belva Crome, MD;  Location: [place] [facility];  Service: Cardiovascular;  Laterality: N/A;  . LEFT HEART CATH AND CORONARY ANGIOGRAPHY N/A 09/03/2016   Procedure: Left Heart Cath and Coronary Angiography;  Surgeon: Nelva Bush, MD;  Location: [place] [facility];  Service: Cardiovascular;  Laterality: N/A;  . TONSILLECTOMY       Current Meds  Medication Sig  . allopurinol (ZYLOPRIM) 300 MG tablet Take 300 mg by mouth daily.  Marland Kitchen apixaban (ELIQUIS) 2.5 MG TABS tablet Take 1 tablet (2.5 mg total) by mouth 2 (two) times daily.  . benazepril (LOTENSIN) 40 MG tablet Take 40 mg by mouth daily.  . clopidogrel (PLAVIX) 75 MG tablet Take 1 tablet (75 mg total) by mouth daily.  . furosemide (LASIX) 80 MG tablet take 1 tablet by mouth once daily (NOTE: MAY TAKE 1/2 TABLET FOR INCREASED LEG SWELLING OR WEIGHT GAIN)  . Insulin Pen  Needle (PEN NEEDLES) 31G X 8 MM MISC 1 each by Does not apply route 4 (four) times daily.  . metolazone (ZAROXOLYN) 2.5 MG tablet TAKE ONE TABLET BY MOUTH TWICE A WEEK AS NEEDED FOR LEG SWELLING, PLEASE DO NOT TAKE THIS NO MORE THAN TWICE WEEKLY, ALSO STOP FERROUS SULFA  . metoprolol succinate (TOPROL-XL) 50 MG 24 hr tablet Take 50 mg by mouth at bedtime.  Marland Kitchen NOVOLOG FLEXPEN 100 UNIT/ML FlexPen ADMINISTER 10 TO 16 UNITS UNDER THE SKIN THREE TIMES DAILY WITH MEALS  . rosuvastatin (CRESTOR) 20 MG tablet Take 1 tablet (20  mg total) by mouth daily.  Nelva Nay SOLOSTAR 300 UNIT/ML SOPN INJECT 60 UNITS INTO THE SKIN AT BEDTIME     Allergies:   Patient has no known allergies.   Social History   Tobacco Use  . Smoking status: Never Smoker  . Smokeless tobacco: Never Used  Substance Use Topics  . Alcohol use: No  . Drug use: No     Family Hx: The patient's family history includes Heart disease in her father.  ROS:   Please see the history of present illness. All other systems reviewed and are negative.   Prior CV studies:   The following studies were reviewed today:  Echocardiogram2/06/2017: Study Conclusions  - Left ventricle: The cavity size was normal. Wall thickness was increased in a pattern of mild LVH. Severe hypokinesis of the mid anteroseptal wall, apical septal wall, true apex, mid to apical anterior wall. Indeterminant diastolic function (atrial fibrillation). The estimated ejection fraction was 45%. - Aortic valve: There was no stenosis. - Mitral valve: Mildly to moderately calcified annulus. There was trivial regurgitation. - Left atrium: The atrium was mildly to moderately dilated. - Right ventricle: The cavity size was normal. Systolic function was normal. - Pulmonary arteries: No complete TR doppler jet so unable to estimate PA systolic pressure. - Systemic veins: IVC measured 2.3 cm with < 50% respirophasic variation, suggesting RA pressure 15 mmHg.  Impressions:  - Normal LV size with mild LV hypertrophy. EF 45% with wall motion abnormalities as described above, in LAD coronary distribution. Normal RV size and systolic function. No significant valvular abnormalities.  Cardiac catheterization 09/03/2016: Conclusions: 1. Multivessel CAD, including 90% mid LAD stenosis involving small diagonal branch, 70% ostial/proximal ramus lesion, 30% OM stenosis and sequential 70-80% proximal/mid RCA stenoses. 2. Mildly elevated left ventricular filling  pressure. 3. Mild to moderate aortic valve gradient, which is suboptimally evaluated due to heart rate variability related to atrial fibrillation. 4. Right radial artery stenosis, which is likely a combination of fixed disease and vasospasm. Stenosis was successfully navigated with angled micropuncture and Versacore wires.  Recommendations: 1. Medical optimization, including further diuresis and close monitoring of renal function. 2. Plan for stage PCI to mid LAD and proximal/mid RCA next week when volume status has improved and renal function has improved/stabilized. 3. Restart heparin infusion 4 hours after TR band removal.  Cardiac PCI 09/08/2016:  CHIP due to renal insufficiency, long diffuse disease in LAD and right coronary, in an elderly diabetic patient felt to be a poor candidate for CABG. Greatest risk is acute kidney injury and access site bleeding. Contrast during the procedure 160 cc.Small hematoma present above cath site at completion of procedure.  Successful LAD provisional stenting with reduction in 90% stenosis to 0% using a 28 x 2.75 Synergy DES postdilated to 3.0 mm in diameter. TIMI grade 2 flow was noted in the diagonal post procedure as was a case prior  to the procedure.  Successful proximal to mid RCA stenting with overlapping 3.5 Promus Premier 24 and 8 mm stents. Stents were postdilated to 4.0 mm reducing stenosis to 0% with TIMI grade 3 flow.  Labs/Other Tests and Data Reviewed:     EKG:  An ECG dated 08/08/2017 was personally reviewed today and demonstrated:  Atrial fibrillation with nonspecific ST changes.  Recent Labs:  05/10/2018: BUN 61; Creatinine 2.4, AST 19, ALT 23, potassium 4.1, Hgb A1c 8.9   Wt Readings from Last 3 Encounters:  12/18/18 269 lb (122 kg)  06/13/18 267 lb (121.1 kg)  05/18/18 263 lb (119.3 kg)     Objective:    Vital Signs:  Ht 5\' 6"  (1.676 m)   Wt 269 lb (122 kg)   BMI 43.42 kg/m    She was not able to check her blood  pressure today. She spoke in full sentences, not short of breath. No audible wheezing or coughing. Speech pattern normal.  ASSESSMENT & PLAN:    1.  DES to the LAD and RCA in February 2018.  We continue medical therapy and observation in the absence of angina symptoms.  She is on Plavix and statin.  2.  Lymphedema, on diuretics and also has mechanical compression device at home.  Weight has been stable.  3.  Permanent atrial fibrillation.  She is on renally dosed Eliquis and Toprol-XL.  4.  CKD stage III, last creatinine was 2.4.  Requesting interval lab work from PCP.  COVID-19 Education: The signs and symptoms of COVID-19 were discussed with the patient and how to seek care for testing (follow up with PCP or arrange E-visit).  The importance of social distancing was discussed today.  Time:   Today, I have spent 6 minutes with the patient with telehealth technology discussing the above problems.     Medication Adjustments/Labs and Tests Ordered: Current medicines are reviewed at length with the patient today.  Concerns regarding medicines are outlined above.   Tests Ordered: No orders of the defined types were placed in this encounter.   Medication Changes: No orders of the defined types were placed in this encounter.   Disposition:  Follow up 6 months in the [place] office.  Signed, Rozann Lesches, MD  12/18/2018 2:17 PM    [facility]

## 2018-12-18 ENCOUNTER — Encounter: Payer: Self-pay | Admitting: *Deleted

## 2018-12-18 ENCOUNTER — Telehealth (INDEPENDENT_AMBULATORY_CARE_PROVIDER_SITE_OTHER): Payer: PPO | Admitting: Cardiology

## 2018-12-18 ENCOUNTER — Encounter: Payer: Self-pay | Admitting: Cardiology

## 2018-12-18 VITALS — Ht 66.0 in | Wt 269.0 lb

## 2018-12-18 DIAGNOSIS — N183 Chronic kidney disease, stage 3 unspecified: Secondary | ICD-10-CM

## 2018-12-18 DIAGNOSIS — I4821 Permanent atrial fibrillation: Secondary | ICD-10-CM

## 2018-12-18 DIAGNOSIS — Z7189 Other specified counseling: Secondary | ICD-10-CM

## 2018-12-18 DIAGNOSIS — I25119 Atherosclerotic heart disease of native coronary artery with unspecified angina pectoris: Secondary | ICD-10-CM

## 2018-12-18 DIAGNOSIS — I890 Lymphedema, not elsewhere classified: Secondary | ICD-10-CM | POA: Diagnosis not present

## 2018-12-18 NOTE — Patient Instructions (Addendum)

## 2018-12-23 ENCOUNTER — Other Ambulatory Visit: Payer: Self-pay | Admitting: Cardiology

## 2019-01-16 DIAGNOSIS — E782 Mixed hyperlipidemia: Secondary | ICD-10-CM | POA: Diagnosis not present

## 2019-01-16 DIAGNOSIS — E1165 Type 2 diabetes mellitus with hyperglycemia: Secondary | ICD-10-CM | POA: Diagnosis not present

## 2019-01-16 DIAGNOSIS — I10 Essential (primary) hypertension: Secondary | ICD-10-CM | POA: Diagnosis not present

## 2019-02-16 DIAGNOSIS — I10 Essential (primary) hypertension: Secondary | ICD-10-CM | POA: Diagnosis not present

## 2019-02-16 DIAGNOSIS — E782 Mixed hyperlipidemia: Secondary | ICD-10-CM | POA: Diagnosis not present

## 2019-03-19 DIAGNOSIS — I10 Essential (primary) hypertension: Secondary | ICD-10-CM | POA: Diagnosis not present

## 2019-03-19 DIAGNOSIS — E782 Mixed hyperlipidemia: Secondary | ICD-10-CM | POA: Diagnosis not present

## 2019-04-16 ENCOUNTER — Other Ambulatory Visit: Payer: Self-pay | Admitting: *Deleted

## 2019-04-16 MED ORDER — ROSUVASTATIN CALCIUM 20 MG PO TABS: 20.0000 mg | ORAL_TABLET | Freq: Every day | ORAL | 3 refills | Status: DC

## 2019-04-18 DIAGNOSIS — E782 Mixed hyperlipidemia: Secondary | ICD-10-CM | POA: Diagnosis not present

## 2019-04-18 DIAGNOSIS — I10 Essential (primary) hypertension: Secondary | ICD-10-CM | POA: Diagnosis not present

## 2019-05-18 DIAGNOSIS — I10 Essential (primary) hypertension: Secondary | ICD-10-CM | POA: Diagnosis not present

## 2019-05-18 DIAGNOSIS — E1165 Type 2 diabetes mellitus with hyperglycemia: Secondary | ICD-10-CM | POA: Diagnosis not present

## 2019-05-18 DIAGNOSIS — E782 Mixed hyperlipidemia: Secondary | ICD-10-CM | POA: Diagnosis not present

## 2019-06-18 DIAGNOSIS — E782 Mixed hyperlipidemia: Secondary | ICD-10-CM | POA: Diagnosis not present

## 2019-06-18 DIAGNOSIS — I10 Essential (primary) hypertension: Secondary | ICD-10-CM | POA: Diagnosis not present

## 2019-07-17 ENCOUNTER — Telehealth (INDEPENDENT_AMBULATORY_CARE_PROVIDER_SITE_OTHER): Payer: PPO | Admitting: Cardiology

## 2019-07-17 ENCOUNTER — Encounter: Payer: Self-pay | Admitting: Cardiology

## 2019-07-17 DIAGNOSIS — I4821 Permanent atrial fibrillation: Secondary | ICD-10-CM

## 2019-07-17 DIAGNOSIS — N1832 Chronic kidney disease, stage 3b: Secondary | ICD-10-CM | POA: Diagnosis not present

## 2019-07-17 DIAGNOSIS — I10 Essential (primary) hypertension: Secondary | ICD-10-CM

## 2019-07-17 DIAGNOSIS — I890 Lymphedema, not elsewhere classified: Secondary | ICD-10-CM

## 2019-07-17 DIAGNOSIS — I25119 Atherosclerotic heart disease of native coronary artery with unspecified angina pectoris: Secondary | ICD-10-CM

## 2019-07-17 NOTE — Progress Notes (Signed)
Virtual Visit via Telephone Note   This visit type was conducted due to national recommendations for restrictions regarding the COVID-19 Pandemic (e.g. social distancing) in an effort to limit this patient's exposure and mitigate transmission in our community.  Due to her co-morbid illnesses, this patient is at least at moderate risk for complications without adequate follow up.  This format is felt to be most appropriate for this patient at this time.  The patient did not have access to video technology/had technical difficulties with video requiring transitioning to audio format only (telephone).  All issues noted in this document were discussed and addressed.  No physical exam could be performed with this format.  Please refer to the patient's chart for her  consent to telehealth for [facility].   Date:  07/17/2019   ID:  Kristina Howell, DOB June 27, 1936, MRN [id number]  Patient Location: Home Provider Location: Office  PCP:  Rosalee Kaufman, PA-C  Cardiologist:  Rozann Lesches, MD Electrophysiologist:  None   Evaluation Performed:  Follow-Up Visit  Chief Complaint:  Cardiac follow-up  History of Present Illness:    Kristina Howell is an 83 y.o. female last assessed via telehealth encounter in June.  We spoke by phone today.  Her husband passed away last week at [facility], had been chronically ill and deteriorating.  His funeral was yesterday.  She seems to be doing reasonably well, has good support from local family.  She feels like he is no longer suffering and in a better place.  She states that she does not get out of the house very much.  Her lymphedema has not been well controlled, she tells me that she would like to get referred for mechanical compression here in [place], we will try to get this arranged.  She otherwise remains on stable diuretics.  She does not report any bleeding problems.  I asked her to get some follow-up lab work done as well.  We went over her  current medication list.  The patient does not have symptoms concerning for COVID-19 infection (fever, chills, cough, or new shortness of breath).    Past Medical History:  Diagnosis Date  . Atrial fibrillation ([place])   . CAD (coronary artery disease)    Multivessel, DES to LAD and RCA 08/2016  . Cardiomyopathy (HCC)    LVEF 45%  . CKD (chronic kidney disease) stage 3, GFR 30-59 ml/min   . Essential hypertension   . Gout   . Pancreatitis, recurrent   . Type 2 diabetes mellitus ([place])    Past Surgical History:  Procedure Laterality Date  . CHOLECYSTECTOMY    . CORONARY STENT INTERVENTION N/A 09/08/2016   Procedure: Coronary Stent Intervention;  Surgeon: Belva Crome, MD;  Location: [place] [facility];  Service: Cardiovascular;  Laterality: N/A;  . LEFT HEART CATH AND CORONARY ANGIOGRAPHY N/A 09/03/2016   Procedure: Left Heart Cath and Coronary Angiography;  Surgeon: Nelva Bush, MD;  Location: [place] [facility];  Service: Cardiovascular;  Laterality: N/A;  . TONSILLECTOMY       Current Meds  Medication Sig  . allopurinol (ZYLOPRIM) 300 MG tablet Take 300 mg by mouth daily.  . benazepril (LOTENSIN) 40 MG tablet Take 40 mg by mouth daily.  . clopidogrel (PLAVIX) 75 MG tablet TAKE 1 TABLET(75 MG) BY MOUTH DAILY  . ELIQUIS 2.5 MG TABS tablet TAKE 1 TABLET(2.5 MG) BY MOUTH TWICE DAILY  . furosemide (LASIX) 80 MG tablet TAKE 1 TABLET BY MOUTH EVERY DAY(NOTE:  MAY TAKE ONE-HALF TABLET FOR INCREASED LEG SWELLING OR WEIGHT GAIN)  . Insulin Pen Needle (PEN NEEDLES) 31G X 8 MM MISC 1 each by Does not apply route 4 (four) times daily.  . metolazone (ZAROXOLYN) 2.5 MG tablet TAKE ONE TABLET BY MOUTH TWICE A WEEK AS NEEDED FOR LEG SWELLING, PLEASE DO NOT TAKE THIS NO MORE THAN TWICE WEEKLY, ALSO STOP FERROUS SULFA  . metoprolol succinate (TOPROL-XL) 50 MG 24 hr tablet Take 50 mg by mouth at bedtime.  Marland Kitchen NOVOLOG FLEXPEN 100 UNIT/ML FlexPen ADMINISTER 10 TO 16 UNITS UNDER THE SKIN THREE TIMES  DAILY WITH MEALS  . TOUJEO SOLOSTAR 300 UNIT/ML SOPN INJECT 60 UNITS INTO THE SKIN AT BEDTIME     Allergies:   Patient has no known allergies.   Social History   Tobacco Use  . Smoking status: Never Smoker  . Smokeless tobacco: Never Used  Substance Use Topics  . Alcohol use: No  . Drug use: No     Family Hx: The patient's family history includes Heart disease in her father.  ROS:   Please see the history of present illness.    Chronic back pain.  Uses a walker. All other systems reviewed and are negative.   Prior CV studies:   The following studies were reviewed today:  Echocardiogram2/06/2017: Study Conclusions  - Left ventricle: The cavity size was normal. Wall thickness was increased in a pattern of mild LVH. Severe hypokinesis of the mid anteroseptal wall, apical septal wall, true apex, mid to apical anterior wall. Indeterminant diastolic function (atrial fibrillation). The estimated ejection fraction was 45%. - Aortic valve: There was no stenosis. - Mitral valve: Mildly to moderately calcified annulus. There was trivial regurgitation. - Left atrium: The atrium was mildly to moderately dilated. - Right ventricle: The cavity size was normal. Systolic function was normal. - Pulmonary arteries: No complete TR doppler jet so unable to estimate PA systolic pressure. - Systemic veins: IVC measured 2.3 cm with < 50% respirophasic variation, suggesting RA pressure 15 mmHg.  Impressions:  - Normal LV size with mild LV hypertrophy. EF 45% with wall motion abnormalities as described above, in LAD coronary distribution. Normal RV size and systolic function. No significant valvular abnormalities.  Cardiac catheterization 09/03/2016: Conclusions: 1. Multivessel CAD, including 90% mid LAD stenosis involving small diagonal branch, 70% ostial/proximal ramus lesion, 30% OM stenosis and sequential 70-80% proximal/mid RCA stenoses. 2. Mildly  elevated left ventricular filling pressure. 3. Mild to moderate aortic valve gradient, which is suboptimally evaluated due to heart rate variability related to atrial fibrillation. 4. Right radial artery stenosis, which is likely a combination of fixed disease and vasospasm. Stenosis was successfully navigated with angled micropuncture and Versacore wires.  Recommendations: 1. Medical optimization, including further diuresis and close monitoring of renal function. 2. Plan for stage PCI to mid LAD and proximal/mid RCA next week when volume status has improved and renal function has improved/stabilized. 3. Restart heparin infusion 4 hours after TR band removal.  Cardiac PCI 09/08/2016:  CHIP due to renal insufficiency, long diffuse disease in LAD and right coronary, in an elderly diabetic patient felt to be a poor candidate for CABG. Greatest risk is acute kidney injury and access site bleeding. Contrast during the procedure 160 cc.Small hematoma present above cath site at completion of procedure.  Successful LAD provisional stenting with reduction in 90% stenosis to 0% using a 28 x 2.75 Synergy DES postdilated to 3.0 mm in diameter. TIMI grade 2 flow was noted in  the diagonal post procedure as was a case prior to the procedure.  Successful proximal to mid RCA stenting with overlapping 3.5 Promus Premier 24 and 8 mm stents. Stents were postdilated to 4.0 mm reducing stenosis to 0% with TIMI grade 3 flow.  Labs/Other Tests and Data Reviewed:    EKG:  An ECG dated 08/08/2017 was personally reviewed today and demonstrated:  Atrial fibrillation with nonspecific ST changes.  Recent Labs:  May 2020: Hemoglobin A1c 9.2%, BUN 60, creatinine 2.89, potassium 3.7, AST 15, ALT 16  Wt Readings from Last 3 Encounters:  12/18/18 269 lb (122 kg)  06/13/18 267 lb (121.1 kg)  05/18/18 263 lb (119.3 kg)     Objective:    Vital Signs:  There were no vitals taken for this visit.   She did not have a way  to check vital signs today. Patient spoke in full sentences, not short of breath. No audible wheezing or coughing.  ASSESSMENT & PLAN:    1.  CAD status post DES to the LAD and RCA in February 2018.  We will continue with observation, she is not reporting any active angina at this time.  Current medical regimen includes Plavix, Toprol-XL, and Crestor.  2.  Chronic lymphedema.  She states that this has worsened over time, we will get her referred for mechanical compression here in [place] if possible.  Continue diuretics.  3.  Permanent atrial fibrillation.  She does not report any active palpitations.  She continues on renally dosed Eliquis and Toprol-XL.  Check CBC and BMET.  4.  CKD stage IIIb, last creatinine 2.89.  COVID-19 Education: The signs and symptoms of COVID-19 were discussed with the patient and how to seek care for testing (follow up with PCP or arrange E-visit).  The importance of social distancing was discussed today.  Time:   Today, I have spent 8 minutes with the patient with telehealth technology discussing the above problems.     Medication Adjustments/Labs and Tests Ordered: Current medicines are reviewed at length with the patient today.  Concerns regarding medicines are outlined above.   Tests Ordered: Orders Placed This Encounter  Procedures  . CBC  . Basic metabolic panel    Medication Changes: No orders of the defined types were placed in this encounter.   Follow Up:  In Person 3 months in the [place] office.  Signed, Rozann Lesches, MD  07/17/2019 1:44 PM    [facility]

## 2019-07-17 NOTE — Patient Instructions (Signed)
Medication Instructions:  Continue all current medications.  Labwork:  CBC, BMET - orders enclosed.   You may do these labs at [place] will contact with results via phone or letter.    Testing/Procedures: none  Follow-Up: 3 months   Any Other Special Instructions Will Be Listed Below (If Applicable). You have been referred to:  Lymphedema clinic in [employer] for evaluation.    If you need a refill on your cardiac medications before your next appointment, please call your pharmacy.

## 2019-07-18 ENCOUNTER — Telehealth: Payer: PPO | Admitting: Cardiology

## 2019-07-18 NOTE — Addendum Note (Signed)
Addended by: Laurine Blazer on: 07/18/2019 05:10 PM   Modules accepted: Orders

## 2019-07-19 DIAGNOSIS — I10 Essential (primary) hypertension: Secondary | ICD-10-CM | POA: Diagnosis not present

## 2019-07-19 DIAGNOSIS — E782 Mixed hyperlipidemia: Secondary | ICD-10-CM | POA: Diagnosis not present

## 2019-07-30 DIAGNOSIS — I890 Lymphedema, not elsewhere classified: Secondary | ICD-10-CM | POA: Diagnosis not present

## 2019-08-06 ENCOUNTER — Telehealth: Payer: Self-pay | Admitting: Cardiology

## 2019-08-06 NOTE — Telephone Encounter (Signed)
Went for rehab was told she could not due to her Blood pressure  171/100

## 2019-08-06 NOTE — Telephone Encounter (Signed)
Pt says at rehab [facility] lymphedema clinic today and BP was 171/100 - pt c/o some dizziness/SOB/diarrhea since last Monday - pt says she noticed BP high last night 204/130 (just got new BP monitor) doesn't know what HR has been but was told a rehab it was normal - no medication changes - aware that if symptoms worsen or BP continues to increase she should be evaluated at ED

## 2019-08-07 MED ORDER — AMLODIPINE BESYLATE 5 MG PO TABS: 5.0000 mg | ORAL_TABLET | Freq: Every day | ORAL | 1 refills | Status: DC

## 2019-08-07 NOTE — Telephone Encounter (Signed)
Pt voiced understanding - Medication sent to pharmacy ° °

## 2019-08-07 NOTE — Telephone Encounter (Signed)
Blood pressure has also been up on prior checks although not to this degree.  Suggest starting Norvasc 5 mg daily.  Continue baseline medications.

## 2019-08-17 DIAGNOSIS — I10 Essential (primary) hypertension: Secondary | ICD-10-CM | POA: Diagnosis not present

## 2019-08-17 DIAGNOSIS — E1165 Type 2 diabetes mellitus with hyperglycemia: Secondary | ICD-10-CM | POA: Diagnosis not present

## 2019-08-17 DIAGNOSIS — E7849 Other hyperlipidemia: Secondary | ICD-10-CM | POA: Diagnosis not present

## 2019-09-03 ENCOUNTER — Telehealth: Payer: Self-pay | Admitting: *Deleted

## 2019-09-03 NOTE — Telephone Encounter (Signed)
Please see if we can get more information.  Has she been tracking her weight and noticed an increase?  If she still taking Lasix 80 mg daily?  Any extra Lasix use or Zaroxolyn use?

## 2019-09-03 NOTE — Telephone Encounter (Signed)
Pt c/o SOB gradually getting worse says O2 97% and also BP staying elevated says BP this morning 170/70 - says she is swelling but does have lymphedema in her legs which she has not been able to attend rehab for this on a regular basis - says she has been taking amlodipine 5 mg as per last phone note

## 2019-09-05 NOTE — Telephone Encounter (Signed)
Pt voiced understanding - does have home scale just hasn't been using it - will update [place] in a few days with symptoms

## 2019-09-05 NOTE — Telephone Encounter (Signed)
    Covering for Dr. Domenic Polite. Would recommend she follow daily weights and weigh at the same time each day and preferably in the morning. If she does not own scales or have access to some, would see if [employer] Ulla Gallo) have any available. Also limit sodium intake to less than 2000 mg and fluid intake to less than 2 L.   If taking Lasix 80mg  daily, would have her continue to take an additional 40mg  in the afternoon for 3-4 days and report back on symptoms and weight. Appears she had to utilize Metolazone in the past but would try Lasix initially and if symptoms do not improve, then Metolazone with close follow-up labs.   Signed, Erma Heritage, PA-C 09/05/2019, 10:56 AM Pager: [phone]

## 2019-09-05 NOTE — Telephone Encounter (Signed)
Pt has not been monitoring weight - denies taking metolazone in the last few weeks - is taking lasix 80 mg daily but has not taken any extra lasix

## 2019-09-13 ENCOUNTER — Telehealth: Payer: Self-pay | Admitting: Cardiology

## 2019-09-13 DIAGNOSIS — I10 Essential (primary) hypertension: Secondary | ICD-10-CM | POA: Diagnosis not present

## 2019-09-13 DIAGNOSIS — I890 Lymphedema, not elsewhere classified: Secondary | ICD-10-CM

## 2019-09-13 DIAGNOSIS — E7849 Other hyperlipidemia: Secondary | ICD-10-CM | POA: Diagnosis not present

## 2019-09-13 NOTE — Telephone Encounter (Signed)
Orders placed and will forward to schedulers

## 2019-09-13 NOTE — Telephone Encounter (Signed)
Received call from [facility] occupational therapy. They need a new order for Lymphedema. Orders are only good for 30 days. Patient was unable to come in the beginning due to [place] .   Please fax new order # [phone].

## 2019-09-14 DIAGNOSIS — E7849 Other hyperlipidemia: Secondary | ICD-10-CM | POA: Diagnosis not present

## 2019-09-14 DIAGNOSIS — I10 Essential (primary) hypertension: Secondary | ICD-10-CM | POA: Diagnosis not present

## 2019-09-25 ENCOUNTER — Telehealth: Payer: Self-pay | Admitting: Cardiology

## 2019-09-25 DIAGNOSIS — I890 Lymphedema, not elsewhere classified: Secondary | ICD-10-CM | POA: Diagnosis not present

## 2019-09-25 NOTE — Telephone Encounter (Signed)
Patient called stating that [employer] sent her a BP monitor for home. She wanted to know if we can see the readings that [employer] has in. States that her BP this morning was 153/53.

## 2019-09-25 NOTE — Telephone Encounter (Signed)
Reports that she has been monitoring her BP for about one month for PCP. Reports today her BP was 153/53. Reports all other readings have been okay. Advised to continue monitoring her BP and advised that since results are going to PCP, if her BP medication needed to be adjusted, her PCP office can take care of it. Verbalized understanding.

## 2019-10-15 ENCOUNTER — Encounter: Payer: Self-pay | Admitting: *Deleted

## 2019-10-16 ENCOUNTER — Other Ambulatory Visit: Payer: Self-pay | Admitting: Cardiology

## 2019-10-16 DIAGNOSIS — E7801 Familial hypercholesterolemia: Secondary | ICD-10-CM | POA: Diagnosis not present

## 2019-10-16 DIAGNOSIS — I10 Essential (primary) hypertension: Secondary | ICD-10-CM | POA: Diagnosis not present

## 2019-10-16 DIAGNOSIS — I890 Lymphedema, not elsewhere classified: Secondary | ICD-10-CM | POA: Diagnosis not present

## 2019-10-16 DIAGNOSIS — I4821 Permanent atrial fibrillation: Secondary | ICD-10-CM | POA: Diagnosis not present

## 2019-10-17 DIAGNOSIS — E1165 Type 2 diabetes mellitus with hyperglycemia: Secondary | ICD-10-CM | POA: Diagnosis not present

## 2019-10-17 DIAGNOSIS — I10 Essential (primary) hypertension: Secondary | ICD-10-CM | POA: Diagnosis not present

## 2019-10-17 DIAGNOSIS — E7849 Other hyperlipidemia: Secondary | ICD-10-CM | POA: Diagnosis not present

## 2019-10-22 ENCOUNTER — Ambulatory Visit: Payer: PPO | Admitting: Cardiology

## 2019-10-22 ENCOUNTER — Other Ambulatory Visit: Payer: Self-pay

## 2019-10-22 ENCOUNTER — Other Ambulatory Visit: Payer: Self-pay | Admitting: Cardiology

## 2019-10-22 ENCOUNTER — Encounter: Payer: Self-pay | Admitting: Cardiology

## 2019-10-22 VITALS — BP 158/78 | HR 91 | Ht 66.0 in | Wt 274.0 lb

## 2019-10-22 DIAGNOSIS — I4821 Permanent atrial fibrillation: Secondary | ICD-10-CM

## 2019-10-22 DIAGNOSIS — I890 Lymphedema, not elsewhere classified: Secondary | ICD-10-CM | POA: Diagnosis not present

## 2019-10-22 DIAGNOSIS — N1831 Chronic kidney disease, stage 3a: Secondary | ICD-10-CM | POA: Diagnosis not present

## 2019-10-22 DIAGNOSIS — I25119 Atherosclerotic heart disease of native coronary artery with unspecified angina pectoris: Secondary | ICD-10-CM

## 2019-10-22 MED ORDER — TORSEMIDE 20 MG PO TABS: ORAL_TABLET | ORAL | 1 refills | Status: DC

## 2019-10-22 NOTE — Patient Instructions (Addendum)
Your physician recommends that you schedule a follow-up appointment in: [place] has recommended you make the following change in your medication:   STOP PLAVIX   START ASPIRIN   STOP LASIX  START TORSEMIDE 80 MG (4 TABLETS) IN THE MORNING AND 40 MG (2 TABLETS) IN THE EVENING   You have been referred to DR [facility]   Thank you for choosing [facility]!!

## 2019-10-22 NOTE — Progress Notes (Signed)
Cardiology Office Note  Date: 10/22/2019   ID: Kristina Howell, DOB 1936-07-10, MRN [id number]  PCP:  Rosalee Kaufman, PA-C  Cardiologist:  Rozann Lesches, MD Electrophysiologist:  None   Chief Complaint  Patient presents with  . Cardiac follow-up    History of Present Illness: Kristina Howell is an 84 y.o. female last assessed via telehealth encounter in December 2020.  She presents for a routine visit.  States that she has been more short of breath in general, taking Lasix 80 mg in the morning and 40 mg in the afternoon.  Her weight has climbed further.  She remains sedentary, has been seeking out treatment for lymphedema which seems to be improving to some degree.  She has been started on Norvasc since last encounter.  Overall blood pressure trend is better.  I reviewed her medications which are listed below.  She has had increased bruising on Plavix and low-dose Eliquis, we discussed switching from Plavix to low-dose aspirin.  Last DES intervention was in 2018.  I personally reviewed her ECG today which showed rate controlled atrial fibrillation with possible old anteroseptal infarct pattern.  Past Medical History:  Diagnosis Date  . Atrial fibrillation ([place])   . CAD (coronary artery disease)    Multivessel, DES to LAD and RCA 08/2016  . Cardiomyopathy ([facility])    LVEF 45%  . CKD (chronic kidney disease) stage 3, GFR 30-59 ml/min   . Essential hypertension   . Gout   . Pancreatitis, recurrent   . Type 2 diabetes mellitus ([place])     Past Surgical History:  Procedure Laterality Date  . CHOLECYSTECTOMY    . CORONARY STENT INTERVENTION N/A 09/08/2016   Procedure: Coronary Stent Intervention;  Surgeon: Belva Crome, MD;  Location: [facility];  Service: Cardiovascular;  Laterality: N/A;  . LEFT HEART CATH AND CORONARY ANGIOGRAPHY N/A 09/03/2016   Procedure: Left Heart Cath and Coronary Angiography;  Surgeon: Nelva Bush, MD;  Location: [facility];   Service: Cardiovascular;  Laterality: N/A;  . TONSILLECTOMY      Current Outpatient Medications  Medication Sig Dispense Refill  . allopurinol (ZYLOPRIM) 300 MG tablet Take 300 mg by mouth daily.    Marland Kitchen amLODipine (NORVASC) 5 MG tablet Take 1 tablet (5 mg total) by mouth daily. 90 tablet 1  . aspirin EC 81 MG tablet Take 81 mg by mouth daily.    . benazepril (LOTENSIN) 40 MG tablet Take 40 mg by mouth daily.    Marland Kitchen ELIQUIS 2.5 MG TABS tablet TAKE 1 TABLET(2.5 MG) BY MOUTH TWICE DAILY 180 tablet 2  . Insulin Pen Needle (PEN NEEDLES) 31G X 8 MM MISC 1 each by Does not apply route 4 (four) times daily. 150 each 5  . metolazone (ZAROXOLYN) 2.5 MG tablet TAKE ONE TABLET BY MOUTH TWICE A WEEK AS NEEDED FOR LEG SWELLING, PLEASE DO NOT TAKE THIS NO MORE THAN TWICE WEEKLY, ALSO STOP FERROUS SULFA 8 tablet 1  . metoprolol succinate (TOPROL-XL) 50 MG 24 hr tablet Take 50 mg by mouth at bedtime.    Marland Kitchen NOVOLOG FLEXPEN 100 UNIT/ML FlexPen ADMINISTER 10 TO 16 UNITS UNDER THE SKIN THREE TIMES DAILY WITH MEALS 15 mL 2  . TOUJEO SOLOSTAR 300 UNIT/ML SOPN INJECT 60 UNITS INTO THE SKIN AT BEDTIME 13.5 mL 0  . rosuvastatin (CRESTOR) 20 MG tablet Take 1 tablet (20 mg total) by mouth daily. 90 tablet 3  . torsemide (DEMADEX) 20 MG tablet TAKE 4 TABLETS  IN THE MORNING AND 2 TABLETS IN THE EVENING 180 tablet 1   No current facility-administered medications for this visit.   Allergies:  Patient has no known allergies.   ROS:   Fatigue, dyspnea on exertion.  Physical Exam: VS:  BP (!) 158/78   Pulse 91   Ht 5\' 6"  (1.676 m)   Wt 274 lb (124.3 kg)   SpO2 98%   BMI 44.22 kg/m , BMI Body mass index is 44.22 kg/m.  Wt Readings from Last 3 Encounters:  10/22/19 274 lb (124.3 kg)  12/18/18 269 lb (122 kg)  06/13/18 267 lb (121.1 kg)    General: Obese elderly woman, using a walker. HEENT: Conjunctiva and lids normal, wearing a mask. Neck: Supple, difficult to assess JVP. Lungs: Decreased breath sounds at the  bases, nonlabored breathing at rest. Cardiac: Irregularly irregular, no S3 or significant systolic murmur. Abdomen: Obese, bowel sounds present. Extremities: Chronic appearing lymphedema.  ECG:  An ECG dated 08/08/2017 was personally reviewed today and demonstrated:  Atrial fibrillation with nonspecific ST changes.  Recent Labwork:    Component Value Date/Time   CHOL 178 02/26/2017 0000   TRIG 318 (A) 02/26/2017 0000   HDL 31 (A) 02/26/2017 0000   CHOLHDL 6.7 08/30/2016 0206   VLDL 26 08/30/2016 0206   LDLCALC 83 02/26/2017 0000  March 2021: BUN 91, creatinine 3.02, potassium 3.6  Other Studies Reviewed Today:  Echocardiogram2/06/2017: Study Conclusions  - Left ventricle: The cavity size was normal. Wall thickness was increased in a pattern of mild LVH. Severe hypokinesis of the mid anteroseptal wall, apical septal wall, true apex, mid to apical anterior wall. Indeterminant diastolic function (atrial fibrillation). The estimated ejection fraction was 45%. - Aortic valve: There was no stenosis. - Mitral valve: Mildly to moderately calcified annulus. There was trivial regurgitation. - Left atrium: The atrium was mildly to moderately dilated. - Right ventricle: The cavity size was normal. Systolic function was normal. - Pulmonary arteries: No complete TR doppler jet so unable to estimate PA systolic pressure. - Systemic veins: IVC measured 2.3 cm with < 50% respirophasic variation, suggesting RA pressure 15 mmHg.  Impressions:  - Normal LV size with mild LV hypertrophy. EF 45% with wall motion abnormalities as described above, in LAD coronary distribution. Normal RV size and systolic function. No significant valvular abnormalities.  Cardiac catheterization 09/03/2016: Conclusions: 1. Multivessel CAD, including 90% mid LAD stenosis involving small diagonal branch, 70% ostial/proximal ramus lesion, 30% OM stenosis and sequential 70-80% proximal/mid  RCA stenoses. 2. Mildly elevated left ventricular filling pressure. 3. Mild to moderate aortic valve gradient, which is suboptimally evaluated due to heart rate variability related to atrial fibrillation. 4. Right radial artery stenosis, which is likely a combination of fixed disease and vasospasm. Stenosis was successfully navigated with angled micropuncture and Versacore wires.  Recommendations: 1. Medical optimization, including further diuresis and close monitoring of renal function. 2. Plan for stage PCI to mid LAD and proximal/mid RCA next week when volume status has improved and renal function has improved/stabilized. 3. Restart heparin infusion 4 hours after TR band removal.  Cardiac PCI 09/08/2016:  CHIP due to renal insufficiency, long diffuse disease in LAD and right coronary, in an elderly diabetic patient felt to be a poor candidate for CABG. Greatest risk is acute kidney injury and access site bleeding. Contrast during the procedure 160 cc.Small hematoma present above cath site at completion of procedure.  Successful LAD provisional stenting with reduction in 90% stenosis to 0% using a  28 x 2.75 Synergy DES postdilated to 3.0 mm in diameter. TIMI grade 2 flow was noted in the diagonal post procedure as was a case prior to the procedure.  Successful proximal to mid RCA stenting with overlapping 3.5 Promus Premier 24 and 8 mm stents. Stents were postdilated to 4.0 mm reducing stenosis to 0% with TIMI grade 3 flow.  Assessment and Plan:  1.  Chronic combined heart failure, last LVEF 45%.  Fluid status has worsened despite increase in baseline Lasix dosing.  Change from Lasix to Demadex 80 mg in the morning and 40 mg in the afternoon.  She has metolazone to use as needed and has been given instructions about this as well.  2.  Essential hypertension, blood pressure trend is generally better following addition of Norvasc.  She also continues on Lotensin and Toprol-XL.  3.   Permanent atrial fibrillation, heart rate is adequately controlled on Toprol-XL.  She remains on low-dose Eliquis for stroke prophylaxis.  4.  CKD stage III-IV, last creatinine 3.02.  She was previously followed with Dr. Lowanda Foster, establish with Dr. Theador Hawthorne.  5.  CAD status post DES to the LAD and RCA in February 2018.  With increased bruising we will stop Plavix and replace with low-dose aspirin.  Medication Adjustments/Labs and Tests Ordered: Current medicines are reviewed at length with the patient today.  Concerns regarding medicines are outlined above.   Tests Ordered: Orders Placed This Encounter  Procedures  . Ambulatory referral to Nephrology  . EKG 12-Lead    Medication Changes: Meds ordered this encounter  Medications  . torsemide (DEMADEX) 20 MG tablet    Sig: TAKE 4 TABLETS IN THE MORNING AND 2 TABLETS IN THE EVENING    Dispense:  180 tablet    Refill:  1    NEW MEDICATION STOP LASIX 10/22/2019    Disposition:  Follow up 1 month virtual versus office visit.  Signed, Satira Sark, MD, [facility] 10/22/2019 3:01 PM    [facility] [address], [place], [place] [address] Phone: [phone]; Fax: [phone]

## 2019-10-23 DIAGNOSIS — I890 Lymphedema, not elsewhere classified: Secondary | ICD-10-CM | POA: Diagnosis not present

## 2019-11-09 DIAGNOSIS — I5022 Chronic systolic (congestive) heart failure: Secondary | ICD-10-CM | POA: Diagnosis not present

## 2019-11-09 DIAGNOSIS — E1129 Type 2 diabetes mellitus with other diabetic kidney complication: Secondary | ICD-10-CM | POA: Diagnosis not present

## 2019-11-09 DIAGNOSIS — R809 Proteinuria, unspecified: Secondary | ICD-10-CM | POA: Diagnosis not present

## 2019-11-09 DIAGNOSIS — Z79899 Other long term (current) drug therapy: Secondary | ICD-10-CM | POA: Diagnosis not present

## 2019-11-09 DIAGNOSIS — E1122 Type 2 diabetes mellitus with diabetic chronic kidney disease: Secondary | ICD-10-CM | POA: Diagnosis not present

## 2019-11-09 DIAGNOSIS — N189 Chronic kidney disease, unspecified: Secondary | ICD-10-CM | POA: Diagnosis not present

## 2019-11-09 DIAGNOSIS — E559 Vitamin D deficiency, unspecified: Secondary | ICD-10-CM | POA: Diagnosis not present

## 2019-11-13 DIAGNOSIS — E1122 Type 2 diabetes mellitus with diabetic chronic kidney disease: Secondary | ICD-10-CM | POA: Diagnosis not present

## 2019-11-13 DIAGNOSIS — E1129 Type 2 diabetes mellitus with other diabetic kidney complication: Secondary | ICD-10-CM | POA: Diagnosis not present

## 2019-11-13 DIAGNOSIS — Z79899 Other long term (current) drug therapy: Secondary | ICD-10-CM | POA: Diagnosis not present

## 2019-11-13 DIAGNOSIS — E559 Vitamin D deficiency, unspecified: Secondary | ICD-10-CM | POA: Diagnosis not present

## 2019-11-13 DIAGNOSIS — R809 Proteinuria, unspecified: Secondary | ICD-10-CM | POA: Diagnosis not present

## 2019-11-13 DIAGNOSIS — N189 Chronic kidney disease, unspecified: Secondary | ICD-10-CM | POA: Diagnosis not present

## 2019-11-15 DIAGNOSIS — I10 Essential (primary) hypertension: Secondary | ICD-10-CM | POA: Diagnosis not present

## 2019-11-15 DIAGNOSIS — E7849 Other hyperlipidemia: Secondary | ICD-10-CM | POA: Diagnosis not present

## 2019-11-16 DIAGNOSIS — E7849 Other hyperlipidemia: Secondary | ICD-10-CM | POA: Diagnosis not present

## 2019-11-16 DIAGNOSIS — I10 Essential (primary) hypertension: Secondary | ICD-10-CM | POA: Diagnosis not present

## 2019-11-20 DIAGNOSIS — I890 Lymphedema, not elsewhere classified: Secondary | ICD-10-CM | POA: Diagnosis not present

## 2019-11-22 DIAGNOSIS — E211 Secondary hyperparathyroidism, not elsewhere classified: Secondary | ICD-10-CM | POA: Diagnosis not present

## 2019-11-22 DIAGNOSIS — E1129 Type 2 diabetes mellitus with other diabetic kidney complication: Secondary | ICD-10-CM | POA: Diagnosis not present

## 2019-11-22 DIAGNOSIS — N189 Chronic kidney disease, unspecified: Secondary | ICD-10-CM | POA: Diagnosis not present

## 2019-11-22 DIAGNOSIS — E876 Hypokalemia: Secondary | ICD-10-CM | POA: Diagnosis not present

## 2019-11-22 DIAGNOSIS — I5022 Chronic systolic (congestive) heart failure: Secondary | ICD-10-CM | POA: Diagnosis not present

## 2019-11-22 DIAGNOSIS — E1122 Type 2 diabetes mellitus with diabetic chronic kidney disease: Secondary | ICD-10-CM | POA: Diagnosis not present

## 2019-11-22 DIAGNOSIS — R809 Proteinuria, unspecified: Secondary | ICD-10-CM | POA: Diagnosis not present

## 2019-11-22 DIAGNOSIS — E559 Vitamin D deficiency, unspecified: Secondary | ICD-10-CM | POA: Diagnosis not present

## 2019-11-26 ENCOUNTER — Telehealth (INDEPENDENT_AMBULATORY_CARE_PROVIDER_SITE_OTHER): Payer: PPO | Admitting: Cardiology

## 2019-11-26 ENCOUNTER — Other Ambulatory Visit: Payer: Self-pay

## 2019-11-26 ENCOUNTER — Encounter: Payer: Self-pay | Admitting: Cardiology

## 2019-11-26 ENCOUNTER — Encounter: Payer: Self-pay | Admitting: *Deleted

## 2019-11-26 VITALS — BP 153/70 | HR 62 | Ht 66.0 in | Wt 276.0 lb

## 2019-11-26 DIAGNOSIS — N184 Chronic kidney disease, stage 4 (severe): Secondary | ICD-10-CM

## 2019-11-26 DIAGNOSIS — I4821 Permanent atrial fibrillation: Secondary | ICD-10-CM

## 2019-11-26 DIAGNOSIS — I5042 Chronic combined systolic (congestive) and diastolic (congestive) heart failure: Secondary | ICD-10-CM

## 2019-11-26 NOTE — Progress Notes (Signed)
Virtual Visit via Telephone Note   This visit type was conducted due to national recommendations for restrictions regarding the COVID-19 Pandemic (e.g. social distancing) in an effort to limit this patient's exposure and mitigate transmission in our community.  Due to her co-morbid illnesses, this patient is at least at moderate risk for complications without adequate follow up.  This format is felt to be most appropriate for this patient at this time.  The patient did not have access to video technology/had technical difficulties with video requiring transitioning to audio format only (telephone).  All issues noted in this document were discussed and addressed.  No physical exam could be performed with this format.  Please refer to the patient's chart for her  consent to telehealth for [facility].   The patient was identified using 2 identifiers.  Date:  11/26/2019   ID:  Kristina Howell, DOB 03/06/1936, MRN [id number]  Patient Location: Home Provider Location: Office  PCP:  Rosalee Kaufman, PA-C  Cardiologist:  Rozann Lesches, MD  Electrophysiologist:  None   Evaluation Performed:  Follow-Up Visit  Chief Complaint:   Cardiac follow-up  History of Present Illness:    Kristina Howell is an 84 y.o. female last seen in April.  We spoke by phone today.  She continues to report dyspnea on exertion and fatigue, has not made much ground in terms of fluid weight loss and does not feel like PT for lymphedema has been as effective.  Unfortunately, it sounds like her renal function continues to deteriorate.  She is following with Dr. Theador Hawthorne and they have discussed possibility of dialysis, she has not yet made a decision on this.  At the last visit we switched from Lasix to Demadex 80 mg in the morning and 40 mg in the afternoon.  Plavix was also discontinued with initiation of low-dose aspirin.  Since that time Demadex has been increased to 200 mg in the morning and 60 mg in the  afternoon.  We are requesting her recent lab work from [facility].   Past Medical History:  Diagnosis Date  . Atrial fibrillation ([place])   . CAD (coronary artery disease)    Multivessel, DES to LAD and RCA 08/2016  . Cardiomyopathy ([facility])    LVEF 45%  . CKD (chronic kidney disease) stage 3, GFR 30-59 ml/min   . Essential hypertension   . Gout   . Pancreatitis, recurrent   . Type 2 diabetes mellitus ([place])    Past Surgical History:  Procedure Laterality Date  . CHOLECYSTECTOMY    . CORONARY STENT INTERVENTION N/A 09/08/2016   Procedure: Coronary Stent Intervention;  Surgeon: Belva Crome, MD;  Location: [facility];  Service: Cardiovascular;  Laterality: N/A;  . LEFT HEART CATH AND CORONARY ANGIOGRAPHY N/A 09/03/2016   Procedure: Left Heart Cath and Coronary Angiography;  Surgeon: Nelva Bush, MD;  Location: [facility];  Service: Cardiovascular;  Laterality: N/A;  . TONSILLECTOMY       Current Meds  Medication Sig  . allopurinol (ZYLOPRIM) 300 MG tablet Take 300 mg by mouth daily.  Marland Kitchen amLODipine (NORVASC) 5 MG tablet Take 1 tablet (5 mg total) by mouth daily.  Marland Kitchen aspirin EC 81 MG tablet Take 81 mg by mouth daily.  . benazepril (LOTENSIN) 40 MG tablet Take 40 mg by mouth daily.  . Cholecalciferol (VITAMIN D) 50 MCG (2000 UT) CAPS Take 1 capsule by mouth daily.  Marland Kitchen ELIQUIS 2.5 MG TABS tablet TAKE 1 TABLET(2.5 MG)  BY MOUTH TWICE DAILY  . metolazone (ZAROXOLYN) 2.5 MG tablet TAKE ONE TABLET BY MOUTH TWICE A WEEK AS NEEDED FOR LEG SWELLING, PLEASE DO NOT TAKE THIS NO MORE THAN TWICE WEEKLY, ALSO STOP FERROUS SULFA  . metoprolol succinate (TOPROL-XL) 50 MG 24 hr tablet Take 50 mg by mouth at bedtime.  . potassium chloride (KLOR-CON) 10 MEQ tablet Take 10 mEq by mouth daily.  . rosuvastatin (CRESTOR) 20 MG tablet Take 1 tablet (20 mg total) by mouth daily.  Marland Kitchen torsemide (DEMADEX) 20 MG tablet TAKE 5 TABLETS BY MOUTH EVERY MORNING AND 3 TABLETS BY MOUTH EVERY EVENI  .  TOUJEO SOLOSTAR 300 UNIT/ML SOPN INJECT 60 UNITS INTO THE SKIN AT BEDTIME  . [DISCONTINUED] torsemide (DEMADEX) 20 MG tablet TAKE 4 TABLETS BY MOUTH EVERY MORNING AND 2 TABLETS BY MOUTH EVERY EVENING (Patient taking differently: TAKE 5 TABLETS BY MOUTH EVERY MORNING AND 3 TABLETS BY MOUTH EVERY EVENING)     Allergies:   Patient has no known allergies.   ROS:   No palpitations or syncope.  Prior CV studies:   The following studies were reviewed today:  Echocardiogram2/06/2017: Study Conclusions  - Left ventricle: The cavity size was normal. Wall thickness was increased in a pattern of mild LVH. Severe hypokinesis of the mid anteroseptal wall, apical septal wall, true apex, mid to apical anterior wall. Indeterminant diastolic function (atrial fibrillation). The estimated ejection fraction was 45%. - Aortic valve: There was no stenosis. - Mitral valve: Mildly to moderately calcified annulus. There was trivial regurgitation. - Left atrium: The atrium was mildly to moderately dilated. - Right ventricle: The cavity size was normal. Systolic function was normal. - Pulmonary arteries: No complete TR doppler jet so unable to estimate PA systolic pressure. - Systemic veins: IVC measured 2.3 cm with < 50% respirophasic variation, suggesting RA pressure 15 mmHg.  Impressions:  - Normal LV size with mild LV hypertrophy. EF 45% with wall motion abnormalities as described above, in LAD coronary distribution. Normal RV size and systolic function. No significant valvular abnormalities.  Cardiac catheterization 09/03/2016: Conclusions: 1. Multivessel CAD, including 90% mid LAD stenosis involving small diagonal branch, 70% ostial/proximal ramus lesion, 30% OM stenosis and sequential 70-80% proximal/mid RCA stenoses. 2. Mildly elevated left ventricular filling pressure. 3. Mild to moderate aortic valve gradient, which is suboptimally evaluated due to heart rate  variability related to atrial fibrillation. 4. Right radial artery stenosis, which is likely a combination of fixed disease and vasospasm. Stenosis was successfully navigated with angled micropuncture and Versacore wires.  Recommendations: 1. Medical optimization, including further diuresis and close monitoring of renal function. 2. Plan for stage PCI to mid LAD and proximal/mid RCA next week when volume status has improved and renal function has improved/stabilized. 3. Restart heparin infusion 4 hours after TR band removal.  Cardiac PCI 09/08/2016:  CHIP due to renal insufficiency, long diffuse disease in LAD and right coronary, in an elderly diabetic patient felt to be a poor candidate for CABG. Greatest risk is acute kidney injury and access site bleeding. Contrast during the procedure 160 cc.Small hematoma present above cath site at completion of procedure.  Successful LAD provisional stenting with reduction in 90% stenosis to 0% using a 28 x 2.75 Synergy DES postdilated to 3.0 mm in diameter. TIMI grade 2 flow was noted in the diagonal post procedure as was a case prior to the procedure.  Successful proximal to mid RCA stenting with overlapping 3.5 Promus Premier 24 and 8 mm stents. Stents  were postdilated to 4.0 mm reducing stenosis to 0% with TIMI grade 3 flow.  Labs/Other Tests and Data Reviewed:    EKG:  An ECG dated 10/22/2019 was personally reviewed today and demonstrated:  Rate controlled atrial fibrillation with possible old anteroseptal infarct pattern.  Recent Labs:  March 2021: BUN 91, creatinine 3.02, potassium 3.6  Wt Readings from Last 3 Encounters:  11/26/19 276 lb (125.2 kg)  10/22/19 274 lb (124.3 kg)  12/18/18 269 lb (122 kg)     Objective:    Vital Signs:  BP (!) 153/70   Pulse 62   Ht 5\' 6"  (1.676 m)   Wt 276 lb (125.2 kg)   SpO2 98%   BMI 44.55 kg/m    Patient spoke in full sentences, not short of breath. No audible wheezing or  coughing.  ASSESSMENT & PLAN:    1.  Chronic combined heart failure with LVEF 45%.  She has not been able to diurese effectively in the setting of worsening renal function, Demadex is now at 100 mg in the morning and 60 mg in the afternoon.  This adjustment was recently made by Dr. Theador Hawthorne.  Continue Norvasc, Lotensin, Toprol-XL, and Crestor.  2.  Permanent atrial fibrillation.  She is on Toprol-XL with good heart rate control and no palpitations.  She remains on renally adjusted Eliquis.  No reported bleeding problems.  3.  CKD stage IV, followed by Dr. Theador Hawthorne.  Requesting recent lab work from [facility].   Time:   Today, I have spent 8 minutes with the patient with telehealth technology discussing the above problems.     Medication Adjustments/Labs and Tests Ordered: Current medicines are reviewed at length with the patient today.  Concerns regarding medicines are outlined above.   Tests Ordered: No orders of the defined types were placed in this encounter.   Medication Changes: No orders of the defined types were placed in this encounter.   Follow Up:  Either In Person or Virtual in July.  Signed, Rozann Lesches, MD  11/26/2019 10:45 AM    [place]

## 2019-11-26 NOTE — Patient Instructions (Addendum)
Medication Instructions:   Your physician recommends that you continue on your current medications as directed. Please refer to the Current Medication list given to you today.  Labwork:  NONE  Testing/Procedures:  NONE  Follow-Up:  Your physician recommends that you schedule a follow-up appointment in: July 2021.  Any Other Special Instructions Will Be Listed Below (If Applicable).  If you need a refill on your cardiac medications before your next appointment, please call your pharmacy.

## 2019-12-03 ENCOUNTER — Other Ambulatory Visit: Payer: Self-pay | Admitting: Cardiology

## 2019-12-17 DIAGNOSIS — L89321 Pressure ulcer of left buttock, stage 1: Secondary | ICD-10-CM | POA: Diagnosis not present

## 2019-12-17 DIAGNOSIS — I5033 Acute on chronic diastolic (congestive) heart failure: Secondary | ICD-10-CM | POA: Diagnosis not present

## 2019-12-17 DIAGNOSIS — I110 Hypertensive heart disease with heart failure: Secondary | ICD-10-CM | POA: Diagnosis not present

## 2019-12-17 DIAGNOSIS — E11621 Type 2 diabetes mellitus with foot ulcer: Secondary | ICD-10-CM | POA: Diagnosis not present

## 2019-12-26 ENCOUNTER — Other Ambulatory Visit: Payer: Self-pay | Admitting: Cardiology

## 2019-12-26 DIAGNOSIS — R609 Edema, unspecified: Secondary | ICD-10-CM | POA: Diagnosis not present

## 2019-12-26 DIAGNOSIS — E782 Mixed hyperlipidemia: Secondary | ICD-10-CM | POA: Diagnosis not present

## 2019-12-26 DIAGNOSIS — Z111 Encounter for screening for respiratory tuberculosis: Secondary | ICD-10-CM | POA: Diagnosis not present

## 2019-12-26 DIAGNOSIS — I10 Essential (primary) hypertension: Secondary | ICD-10-CM | POA: Diagnosis not present

## 2019-12-26 DIAGNOSIS — Z6841 Body Mass Index (BMI) 40.0 and over, adult: Secondary | ICD-10-CM | POA: Diagnosis not present

## 2019-12-26 DIAGNOSIS — E1165 Type 2 diabetes mellitus with hyperglycemia: Secondary | ICD-10-CM | POA: Diagnosis not present

## 2019-12-26 DIAGNOSIS — M109 Gout, unspecified: Secondary | ICD-10-CM | POA: Diagnosis not present

## 2019-12-26 DIAGNOSIS — I482 Chronic atrial fibrillation, unspecified: Secondary | ICD-10-CM | POA: Diagnosis not present

## 2020-01-04 DIAGNOSIS — E1122 Type 2 diabetes mellitus with diabetic chronic kidney disease: Secondary | ICD-10-CM | POA: Diagnosis not present

## 2020-01-04 DIAGNOSIS — E1129 Type 2 diabetes mellitus with other diabetic kidney complication: Secondary | ICD-10-CM | POA: Diagnosis not present

## 2020-01-04 DIAGNOSIS — N189 Chronic kidney disease, unspecified: Secondary | ICD-10-CM | POA: Diagnosis not present

## 2020-01-04 DIAGNOSIS — I5022 Chronic systolic (congestive) heart failure: Secondary | ICD-10-CM | POA: Diagnosis not present

## 2020-01-04 DIAGNOSIS — R809 Proteinuria, unspecified: Secondary | ICD-10-CM | POA: Diagnosis not present

## 2020-01-04 DIAGNOSIS — E211 Secondary hyperparathyroidism, not elsewhere classified: Secondary | ICD-10-CM | POA: Diagnosis not present

## 2020-01-16 DIAGNOSIS — E11621 Type 2 diabetes mellitus with foot ulcer: Secondary | ICD-10-CM | POA: Diagnosis not present

## 2020-01-16 DIAGNOSIS — I5033 Acute on chronic diastolic (congestive) heart failure: Secondary | ICD-10-CM | POA: Diagnosis not present

## 2020-01-16 DIAGNOSIS — L89321 Pressure ulcer of left buttock, stage 1: Secondary | ICD-10-CM | POA: Diagnosis not present

## 2020-01-16 DIAGNOSIS — I110 Hypertensive heart disease with heart failure: Secondary | ICD-10-CM | POA: Diagnosis not present

## 2020-01-18 DIAGNOSIS — I872 Venous insufficiency (chronic) (peripheral): Secondary | ICD-10-CM | POA: Diagnosis not present

## 2020-01-18 DIAGNOSIS — I110 Hypertensive heart disease with heart failure: Secondary | ICD-10-CM | POA: Diagnosis not present

## 2020-01-18 DIAGNOSIS — E1151 Type 2 diabetes mellitus with diabetic peripheral angiopathy without gangrene: Secondary | ICD-10-CM | POA: Diagnosis not present

## 2020-01-18 DIAGNOSIS — Z7982 Long term (current) use of aspirin: Secondary | ICD-10-CM | POA: Diagnosis not present

## 2020-01-18 DIAGNOSIS — M109 Gout, unspecified: Secondary | ICD-10-CM | POA: Diagnosis not present

## 2020-01-18 DIAGNOSIS — I509 Heart failure, unspecified: Secondary | ICD-10-CM | POA: Diagnosis not present

## 2020-01-18 DIAGNOSIS — I890 Lymphedema, not elsewhere classified: Secondary | ICD-10-CM | POA: Diagnosis not present

## 2020-01-18 DIAGNOSIS — D649 Anemia, unspecified: Secondary | ICD-10-CM | POA: Diagnosis not present

## 2020-01-18 DIAGNOSIS — I2510 Atherosclerotic heart disease of native coronary artery without angina pectoris: Secondary | ICD-10-CM | POA: Diagnosis not present

## 2020-01-18 DIAGNOSIS — Z7901 Long term (current) use of anticoagulants: Secondary | ICD-10-CM | POA: Diagnosis not present

## 2020-01-18 DIAGNOSIS — I482 Chronic atrial fibrillation, unspecified: Secondary | ICD-10-CM | POA: Diagnosis not present

## 2020-01-18 DIAGNOSIS — Z794 Long term (current) use of insulin: Secondary | ICD-10-CM | POA: Diagnosis not present

## 2020-01-18 DIAGNOSIS — Z6841 Body Mass Index (BMI) 40.0 and over, adult: Secondary | ICD-10-CM | POA: Diagnosis not present

## 2020-01-18 DIAGNOSIS — E782 Mixed hyperlipidemia: Secondary | ICD-10-CM | POA: Diagnosis not present

## 2020-01-22 DIAGNOSIS — I890 Lymphedema, not elsewhere classified: Secondary | ICD-10-CM | POA: Diagnosis not present

## 2020-01-22 DIAGNOSIS — N184 Chronic kidney disease, stage 4 (severe): Secondary | ICD-10-CM | POA: Diagnosis not present

## 2020-01-22 DIAGNOSIS — I4821 Permanent atrial fibrillation: Secondary | ICD-10-CM | POA: Diagnosis not present

## 2020-01-22 DIAGNOSIS — E1122 Type 2 diabetes mellitus with diabetic chronic kidney disease: Secondary | ICD-10-CM | POA: Diagnosis not present

## 2020-01-22 DIAGNOSIS — I5042 Chronic combined systolic (congestive) and diastolic (congestive) heart failure: Secondary | ICD-10-CM | POA: Diagnosis not present

## 2020-01-30 ENCOUNTER — Emergency Department (HOSPITAL_COMMUNITY)
Admission: EM | Admit: 2020-01-30 | Discharge: 2020-01-30 | Disposition: A | Discharge status: Home / Self Care | Payer: PPO | Attending: Emergency Medicine | Admitting: Emergency Medicine

## 2020-01-30 ENCOUNTER — Other Ambulatory Visit: Payer: Self-pay

## 2020-01-30 ENCOUNTER — Encounter (HOSPITAL_COMMUNITY): Payer: Self-pay | Admitting: Emergency Medicine

## 2020-01-30 DIAGNOSIS — Z7901 Long term (current) use of anticoagulants: Secondary | ICD-10-CM | POA: Diagnosis not present

## 2020-01-30 DIAGNOSIS — I25119 Atherosclerotic heart disease of native coronary artery with unspecified angina pectoris: Secondary | ICD-10-CM | POA: Insufficient documentation

## 2020-01-30 DIAGNOSIS — L03116 Cellulitis of left lower limb: Secondary | ICD-10-CM | POA: Diagnosis not present

## 2020-01-30 DIAGNOSIS — N184 Chronic kidney disease, stage 4 (severe): Secondary | ICD-10-CM | POA: Insufficient documentation

## 2020-01-30 DIAGNOSIS — I5021 Acute systolic (congestive) heart failure: Secondary | ICD-10-CM | POA: Insufficient documentation

## 2020-01-30 DIAGNOSIS — E1122 Type 2 diabetes mellitus with diabetic chronic kidney disease: Secondary | ICD-10-CM | POA: Insufficient documentation

## 2020-01-30 DIAGNOSIS — Z7982 Long term (current) use of aspirin: Secondary | ICD-10-CM | POA: Diagnosis not present

## 2020-01-30 DIAGNOSIS — Z79899 Other long term (current) drug therapy: Secondary | ICD-10-CM | POA: Diagnosis not present

## 2020-01-30 DIAGNOSIS — I1310 Hypertensive heart and chronic kidney disease without heart failure, with stage 1 through stage 4 chronic kidney disease, or unspecified chronic kidney disease: Secondary | ICD-10-CM | POA: Insufficient documentation

## 2020-01-30 DIAGNOSIS — Z794 Long term (current) use of insulin: Secondary | ICD-10-CM | POA: Insufficient documentation

## 2020-01-30 DIAGNOSIS — L03115 Cellulitis of right lower limb: Secondary | ICD-10-CM | POA: Diagnosis not present

## 2020-01-30 DIAGNOSIS — I890 Lymphedema, not elsewhere classified: Secondary | ICD-10-CM | POA: Diagnosis not present

## 2020-01-30 DIAGNOSIS — R2243 Localized swelling, mass and lump, lower limb, bilateral: Secondary | ICD-10-CM | POA: Diagnosis present

## 2020-01-30 DIAGNOSIS — L03119 Cellulitis of unspecified part of limb: Secondary | ICD-10-CM | POA: Insufficient documentation

## 2020-01-30 LAB — COMPREHENSIVE METABOLIC PANEL
ALT: 21 U/L (ref 0–44)
AST: 21 U/L (ref 15–41)
Albumin: 2.9 g/dL — ABNORMAL LOW (ref 3.5–5.0)
Alkaline Phosphatase: 110 U/L (ref 38–126)
Anion gap: 12 (ref 5–15)
BUN: 77 mg/dL — ABNORMAL HIGH (ref 8–23)
CO2: 25 mmol/L (ref 22–32)
Calcium: 8.9 mg/dL (ref 8.9–10.3)
Chloride: 102 mmol/L (ref 98–111)
Creatinine, Ser: 3.13 mg/dL — ABNORMAL HIGH (ref 0.44–1.00)
GFR calc Af Amer: 15 mL/min — ABNORMAL LOW (ref >60)
GFR calc non Af Amer: 13 mL/min — ABNORMAL LOW (ref >60)
Glucose, Bld: 221 mg/dL — ABNORMAL HIGH (ref 70–99)
Potassium: 3.6 mmol/L (ref 3.5–5.1)
Sodium: 139 mmol/L (ref 135–145)
Total Bilirubin: 0.4 mg/dL (ref 0.3–1.2)
Total Protein: 6.9 g/dL (ref 6.5–8.1)

## 2020-01-30 LAB — CBC WITH DIFFERENTIAL/PLATELET
Abs Immature Granulocytes: 0.03 10*3/uL (ref 0.00–0.07)
Basophils Absolute: 0 10*3/uL (ref 0.0–0.1)
Basophils Relative: 0 %
Eosinophils Absolute: 0.9 10*3/uL — ABNORMAL HIGH (ref 0.0–0.5)
Eosinophils Relative: 8 %
HCT: 31.2 % — ABNORMAL LOW (ref 36.0–46.0)
Hemoglobin: 9.6 g/dL — ABNORMAL LOW (ref 12.0–15.0)
Immature Granulocytes: 0 %
Lymphocytes Relative: 15 %
Lymphs Abs: 1.5 10*3/uL (ref 0.7–4.0)
MCH: 28.3 pg (ref 26.0–34.0)
MCHC: 30.8 g/dL (ref 30.0–36.0)
MCV: 92 fL (ref 80.0–100.0)
Monocytes Absolute: 0.8 10*3/uL (ref 0.1–1.0)
Monocytes Relative: 7 %
Neutro Abs: 7.3 10*3/uL (ref 1.7–7.7)
Neutrophils Relative %: 70 %
Platelets: 248 10*3/uL (ref 150–400)
RBC: 3.39 MIL/uL — ABNORMAL LOW (ref 3.87–5.11)
RDW: 17.1 % — ABNORMAL HIGH (ref 11.5–15.5)
WBC: 10.5 10*3/uL (ref 4.0–10.5)
nRBC: 0 % (ref 0.0–0.2)

## 2020-01-30 MED ORDER — CEPHALEXIN 500 MG PO CAPS
500.0000 mg | ORAL_CAPSULE | Freq: Once | ORAL | Status: AC
  Administered 2020-01-30: 500 mg via ORAL
  Filled 2020-01-30: qty 1

## 2020-01-30 MED ORDER — HYDROCODONE-ACETAMINOPHEN 5-325 MG PO TABS: 1.0000 | ORAL_TABLET | ORAL | 0 refills | Status: DC | PRN

## 2020-01-30 MED ORDER — CEPHALEXIN 500 MG PO CAPS
500.0000 mg | ORAL_CAPSULE | Freq: Four times a day (QID) | ORAL | 0 refills | Status: DC
Start: 2020-01-30 — End: 2020-04-07

## 2020-01-30 MED ORDER — HYDROCODONE-ACETAMINOPHEN 5-325 MG PO TABS
1.0000 | ORAL_TABLET | Freq: Once | ORAL | Status: AC
  Administered 2020-01-30: 1 via ORAL
  Filled 2020-01-30: qty 1

## 2020-01-30 NOTE — ED Provider Notes (Signed)
[facility] Provider Note   CSN: [id number] Arrival date & time: 01/30/20  1149     History Chief Complaint  Patient presents with  . Leg Swelling    Kristina Howell is a 84 y.o. female with history of atrial fibrillation on Eliquis, CAD, chronic kidney disease, pancreatitis, hypertension, gout and type 2 diabetes, also with a history of bilateral lower extremity lymphedema of chronic duration presenting with bilateral upper medial and posterior thigh pain, swelling and redness which has been slowly progressive for the past 2 weeks.  She is reporting severe worsened pain today with difficulty walking as any pressure causes increased pain.  She has had no documented fevers or chills, she has not missed any doses of her Eliquis and states she is also on a baby aspirin which she takes daily. Pt also denies sob, chest pain or orthopnea.  She has taken Tylenol without improvement in her pain.  She was also prescribed a short course of tramadol which she was to take prior to physical therapy treatments at her assisted living facility.  This has also not improved her pain.  The history is provided by the patient.       Past Medical History:  Diagnosis Date  . Atrial fibrillation ([place])   . CAD (coronary artery disease)    Multivessel, DES to LAD and RCA 08/2016  . Cardiomyopathy (HCC)    LVEF 45%  . CKD (chronic kidney disease) stage 3, GFR 30-59 ml/min   . Essential hypertension   . Gout   . Pancreatitis, recurrent   . Type 2 diabetes mellitus [facility])     Patient Active Problem List   Diagnosis Date Noted  . Type 2 diabetes mellitus with stage 4 chronic kidney disease, with long-term current use of insulin ([place]) 08/30/2017  . Diabetes mellitus ([place]) 08/30/2017  . Mixed hyperlipidemia 08/30/2017  . Essential hypertension, benign 08/30/2017  . Femoral artery hematoma complicating cardiac catheterization 09/15/2016  . Status post coronary artery stent placement   .  Difficulty in walking, not elsewhere classified   . Coronary artery disease involving native coronary artery of native heart with unstable angina pectoris ([place])   . Coronary artery disease due to lipid rich plaque   . Chronic anticoagulation   . Acute systolic heart failure ([place])   . Unstable angina ([place])   . Type 2 diabetes mellitus with diabetic foot ulcer ([place]) 08/30/2016  . Acute kidney injury ([place]) 08/30/2016  . Dyspnea 08/30/2016  . Peripheral edema   . Persistent atrial fibrillation ([place])   . Morbidly obese [facility])     Past Surgical History:  Procedure Laterality Date  . CHOLECYSTECTOMY    . CORONARY STENT INTERVENTION N/A 09/08/2016   Procedure: Coronary Stent Intervention;  Surgeon: Belva Crome, MD;  Location: [facility];  Service: Cardiovascular;  Laterality: N/A;  . LEFT HEART CATH AND CORONARY ANGIOGRAPHY N/A 09/03/2016   Procedure: Left Heart Cath and Coronary Angiography;  Surgeon: Nelva Bush, MD;  Location: [place] [facility];  Service: Cardiovascular;  Laterality: N/A;  . TONSILLECTOMY       OB History   No obstetric history on file.     Family History  Problem Relation Age of Onset  . Heart disease Father     Social History   Tobacco Use  . Smoking status: Never Smoker  . Smokeless tobacco: Never Used  Vaping Use  . Vaping Use: Never used  Substance Use Topics  . Alcohol use:  No  . Drug use: No    Home Medications Prior to Admission medications   Medication Sig Start Date End Date Taking? Authorizing Provider  allopurinol (ZYLOPRIM) 300 MG tablet Take 300 mg by mouth daily.    [provider]  amLODipine (NORVASC) 5 MG tablet Take 1 tablet (5 mg total) by mouth daily. 08/07/19 11/26/19  Satira Sark, MD  aspirin EC 81 MG tablet Take 81 mg by mouth daily.    [provider]  benazepril (LOTENSIN) 40 MG tablet Take 40 mg by mouth daily. 08/26/16   [provider]  cephALEXin (KEFLEX) 500 MG capsule Take 1  capsule (500 mg total) by mouth 4 (four) times daily. 01/30/20   Evalee Jefferson, PA-C  Cholecalciferol (VITAMIN D) 50 MCG (2000 UT) CAPS Take 1 capsule by mouth daily.    [provider]  ELIQUIS 2.5 MG TABS tablet TAKE 1 TABLET(2.5 MG) BY MOUTH TWICE DAILY 12/03/19   Satira Sark, MD  HYDROcodone-acetaminophen (NORCO/VICODIN) 5-325 MG tablet Take 1 tablet by mouth every 4 (four) hours as needed for moderate pain. 01/30/20   Evalee Jefferson, PA-C  Insulin Pen Needle (PEN NEEDLES) 31G X 8 MM MISC 1 each by Does not apply route 4 (four) times daily. 10/14/17   Cassandria Anger, MD  metolazone (ZAROXOLYN) 2.5 MG tablet TAKE ONE TABLET BY MOUTH TWICE A WEEK AS NEEDED FOR LEG SWELLING, PLEASE DO NOT TAKE THIS NO MORE THAN TWICE WEEKLY, ALSO STOP FERROUS SULFA 02/03/17   Satira Sark, MD  metoprolol succinate (TOPROL-XL) 50 MG 24 hr tablet Take 50 mg by mouth at bedtime. 08/26/16   [provider]  NOVOLOG FLEXPEN 100 UNIT/ML FlexPen ADMINISTER 10 TO 16 UNITS UNDER THE SKIN THREE TIMES DAILY WITH MEALS 07/31/18   Nida, Marella Chimes, MD  potassium chloride (KLOR-CON) 10 MEQ tablet Take 10 mEq by mouth daily. 11/22/19   [provider]  rosuvastatin (CRESTOR) 20 MG tablet Take 1 tablet (20 mg total) by mouth daily. 04/16/19 11/26/19  Satira Sark, MD  torsemide (DEMADEX) 20 MG tablet Take 5 tablets (100 mg total) by mouth in the morning. & 3 tablets (60 mg total) by mouth in the afternoon 12/27/19   Satira Sark, MD  TOUJEO SOLOSTAR 300 UNIT/ML SOPN INJECT 60 UNITS INTO THE SKIN AT BEDTIME 10/23/18   Cassandria Anger, MD    Allergies    Patient has no known allergies.  Review of Systems   Review of Systems  Constitutional: Negative for chills and fever.  HENT: Negative for congestion and sore throat.   Eyes: Negative.   Respiratory: Negative for chest tightness and shortness of breath.   Cardiovascular: Positive for leg swelling. Negative for chest pain.    Gastrointestinal: Negative for abdominal pain and nausea.  Genitourinary: Negative.   Musculoskeletal: Positive for arthralgias. Negative for joint swelling and neck pain.  Skin: Positive for color change. Negative for rash.  Neurological: Negative for dizziness, weakness, light-headedness, numbness and headaches.  Psychiatric/Behavioral: Negative.   All other systems reviewed and are negative.   Physical Exam Updated Vital Signs BP (!) 163/60   Pulse 73   Temp 98.1 F (36.7 C)   Resp 16   Ht 5\' 6"  (1.676 m)   Wt 125 kg   SpO2 99%   BMI 44.48 kg/m   Physical Exam Vitals and nursing note reviewed.  Constitutional:      Appearance: She is well-developed.  HENT:  Head: Normocephalic and atraumatic.  Eyes:     Conjunctiva/sclera: Conjunctivae normal.  Cardiovascular:     Rate and Rhythm: Normal rate and regular rhythm.     Pulses:          Dorsalis pedis pulses are 2+ on the right side and 2+ on the left side.     Heart sounds: Normal heart sounds.  Pulmonary:     Effort: Pulmonary effort is normal.     Breath sounds: Normal breath sounds. No wheezing, rhonchi or rales.  Abdominal:     General: Bowel sounds are normal.     Palpations: Abdomen is soft.     Tenderness: There is no abdominal tenderness.  Musculoskeletal:        General: Swelling and tenderness present. Normal range of motion.     Cervical back: Normal range of motion.     Right lower leg: Edema present.     Left lower leg: Edema present.     Comments: Bilateral lower leg and ankle skin changes consistent with chronic lymphangitis.  She is exquisitely tender to palpation at her bilateral medial lower thighs.  There is some edema present here, trace erythema.  No palpable cords or lesions.  Skin:    General: Skin is warm and dry.  Neurological:     Mental Status: She is alert.     ED Results / Procedures / Treatments   Labs (all labs ordered are listed, but only abnormal results are  displayed) Labs Reviewed  CBC WITH DIFFERENTIAL/PLATELET - Abnormal; Notable for the following components:      Result Value   RBC 3.39 (*)    Hemoglobin 9.6 (*)    HCT 31.2 (*)    RDW 17.1 (*)    Eosinophils Absolute 0.9 (*)    All other components within normal limits  COMPREHENSIVE METABOLIC PANEL - Abnormal; Notable for the following components:   Glucose, Bld 221 (*)    BUN 77 (*)    Creatinine, Ser 3.13 (*)    Albumin 2.9 (*)    GFR calc non Af Amer 13 (*)    GFR calc Af Amer 15 (*)    All other components within normal limits    EKG None  Radiology No results found.  Procedures Procedures (including critical care time)  Medications Ordered in ED Medications  HYDROcodone-acetaminophen (NORCO/VICODIN) 5-325 MG per tablet 1 tablet (1 tablet Oral Given 01/30/20 1336)  cephALEXin (KEFLEX) capsule 500 mg (500 mg Oral Given 01/30/20 1336)    ED Course  I have reviewed the triage vital signs and the nursing notes.  Pertinent labs & imaging results that were available during my care of the patient were reviewed by me and considered in my medical decision making (see chart for details).    MDM Rules/Calculators/A&P                          Pt with chronic lymphedema of lower extremities, now with increased bilateral pain and subtle erythema distal medial thighs. Compliant with her eliquis and also on baby asa. Bilateral dvt's highly unlikely. Lungs clear without rales, CHF unlikely. Labs reviewed and stable, her creatinine today is 3.13, last month was 3.0, so stable.  Exam reveals increased lymphedema, possibly with early cellulitis superimposed.  She was started on Keflex, hydrocodone prescribed for pain relief.   Advised elevation, warm compresses. Also advised increase in her torsemide dosing x 5 days, then recheck by pcp.  Pt was seen by Dr. Sabra Heck prior to dc home. Final Clinical Impression(s) / ED Diagnoses Final diagnoses:  Cellulitis of lower extremity,  unspecified laterality  Lymphedema    Rx / DC Orders ED Discharge Orders         Ordered    cephALEXin (KEFLEX) 500 MG capsule  4 times daily     Discontinue  Reprint     01/30/20 1351    HYDROcodone-acetaminophen (NORCO/VICODIN) 5-325 MG tablet  Every 4 hours PRN,   Status:  Discontinued     Reprint     01/30/20 1351    HYDROcodone-acetaminophen (NORCO/VICODIN) 5-325 MG tablet  Every 4 hours PRN     Discontinue  Reprint     01/30/20 1352           Evalee Jefferson, PA-C 01/31/20 1016    Noemi Chapel, MD 02/01/20 [phone]

## 2020-01-30 NOTE — ED Provider Notes (Signed)
This patient is an 84 year old female, she has a history of chronic lymphedema of her bilateral lower extremities.  She presents with increasing pain and swelling going up her legs with some redness on the medial aspect of her leg tissue.  This is just above the knee.  On my exam she does have tenderness in the bilateral legs, they are symmetrical, edematous, worse than usual.  She is not tachycardic or short of breath, her lung and heart exams are unremarkable.  She does have some warmth to the legs bilaterally but no leukocytosis, no fever or tachycardia.  It seems that if the patient is anticoagulated her risk for bilateral DVT would be extremely low.  This is more likely to be cellulitis secondary to her lymphedema or possibly just spreading edema however the patient is already on large doses of diuretics.  I recommended follow-up [place] to further taper her diuretics whether it needs to go up or down over time but we will start an antibiotic to treat for probable early cellulitis.  The patient is well-appearing and stable to be discharged but needs close follow-up and she is understanding of this.  Medical screening examination/treatment/procedure(s) were conducted as a shared visit with non-physician practitioner(s) and myself.  I personally evaluated the patient during the encounter.  Clinical Impression:   Final diagnoses:  Cellulitis of lower extremity, unspecified laterality  Lymphedema         Noemi Chapel, MD 01/31/20 [phone]

## 2020-01-30 NOTE — Discharge Instructions (Addendum)
Take the entire course of the antibiotics prescribed.  You may also take the hydrocodone that has been prescribed if needed for pain relief.  This medication will cause drowsiness so use caution with this medication.  It can also cause constipation, so make sure you are taking a stool softener such as Colace while using this pain medication.  Do not take your tramadol while you are taking this stronger pain medication.  Warm compresses to your legs where you have these new complaints several times daily along with elevation of your legs can also help with your symptoms.  Get rechecked for any worsening symptoms or if you develop new symptoms such as fevers.  Additionally, I would recommend increasing your torsemide taking 5 tablets in the morning as you are currently doing, increase in your evening dose also to 5 tablets for the next 5 days only to help improve the swelling in your legs.  Plan a recheck by your primary doctor early next week.

## 2020-01-30 NOTE — ED Triage Notes (Signed)
Pt c/o bilateral leg swelling for the past 2 wks. Pt has lymphedema and has what looks like cellulitis to both legs.

## 2020-01-31 DIAGNOSIS — I890 Lymphedema, not elsewhere classified: Secondary | ICD-10-CM | POA: Diagnosis not present

## 2020-01-31 DIAGNOSIS — L0390 Cellulitis, unspecified: Secondary | ICD-10-CM | POA: Diagnosis not present

## 2020-02-04 ENCOUNTER — Encounter: Payer: Self-pay | Admitting: Cardiology

## 2020-02-04 ENCOUNTER — Telehealth (INDEPENDENT_AMBULATORY_CARE_PROVIDER_SITE_OTHER): Payer: PPO | Admitting: Cardiology

## 2020-02-04 VITALS — Ht 66.0 in | Wt 269.0 lb

## 2020-02-04 DIAGNOSIS — I25119 Atherosclerotic heart disease of native coronary artery with unspecified angina pectoris: Secondary | ICD-10-CM | POA: Diagnosis not present

## 2020-02-04 DIAGNOSIS — N184 Chronic kidney disease, stage 4 (severe): Secondary | ICD-10-CM

## 2020-02-04 DIAGNOSIS — I4821 Permanent atrial fibrillation: Secondary | ICD-10-CM

## 2020-02-04 DIAGNOSIS — I5042 Chronic combined systolic (congestive) and diastolic (congestive) heart failure: Secondary | ICD-10-CM | POA: Diagnosis not present

## 2020-02-04 NOTE — Progress Notes (Signed)
Virtual Visit via Telephone Note   This visit type was conducted due to national recommendations for restrictions regarding the COVID-19 Pandemic (e.g. social distancing) in an effort to limit this patient's exposure and mitigate transmission in our community.  Due to her co-morbid illnesses, this patient is at least at moderate risk for complications without adequate follow up.  This format is felt to be most appropriate for this patient at this time.  The patient did not have access to video technology/had technical difficulties with video requiring transitioning to audio format only (telephone).  All issues noted in this document were discussed and addressed.  No physical exam could be performed with this format.  Please refer to the patient's chart for her  consent to telehealth for [facility].   The patient was identified using 2 identifiers.  Date:  02/04/2020   ID:  Kristina Howell, DOB 01/09/1936, MRN [id number]  Patient Location: Nursing facility Provider Location: Office/Clinic  PCP:  Rosalee Kaufman, PA-C  Cardiologist:  Rozann Lesches, MD Electrophysiologist:  None   Evaluation Performed:  Follow-Up Visit  Chief Complaint:  Cardiac follow-up  History of Present Illness:    Kristina Howell is an 84 y.o. female last assessed via telehealth encounter in May.  We spoke by phone today.  She is now living at [facility].  She reports continued trouble with leg swelling/lymphedema, also diagnosed with cellulitis at ER visit recently.  Visit noted with Dr. Theador Hawthorne in June.  At that point BUN was 89 with creatinine 3.0 and potassium 4.2.  She has CKD stage IV.  At her recent ER visit, BUN and creatinine were 77 and 3.13 respectively.  She has been taking Demadex at 100 mg twice daily at least through Wednesday of this week.  Her weight is down but she still feels like her legs are tight.  I reviewed the remainder of her medications which are otherwise stable from a cardiac  perspective.  She does not report any obvious angina at this time with limited functional capacity.  Past Medical History:  Diagnosis Date  . Atrial fibrillation ([place])   . CAD (coronary artery disease)    Multivessel, DES to LAD and RCA 08/2016  . Cardiomyopathy (HCC)    LVEF 45%  . CKD (chronic kidney disease) stage 3, GFR 30-59 ml/min   . Essential hypertension   . Gout   . Pancreatitis, recurrent   . Type 2 diabetes mellitus ([place])    Past Surgical History:  Procedure Laterality Date  . CHOLECYSTECTOMY    . CORONARY STENT INTERVENTION N/A 09/08/2016   Procedure: Coronary Stent Intervention;  Surgeon: Belva Crome, MD;  Location: [facility];  Service: Cardiovascular;  Laterality: N/A;  . LEFT HEART CATH AND CORONARY ANGIOGRAPHY N/A 09/03/2016   Procedure: Left Heart Cath and Coronary Angiography;  Surgeon: Nelva Bush, MD;  Location: [facility];  Service: Cardiovascular;  Laterality: N/A;  . TONSILLECTOMY       Current Meds  Medication Sig  . allopurinol (ZYLOPRIM) 300 MG tablet Take 300 mg by mouth daily.  Marland Kitchen amLODipine (NORVASC) 5 MG tablet Take 1 tablet (5 mg total) by mouth daily.  Marland Kitchen aspirin EC 81 MG tablet Take 81 mg by mouth daily.  . benazepril (LOTENSIN) 40 MG tablet Take 40 mg by mouth daily.  . cephALEXin (KEFLEX) 500 MG capsule Take 1 capsule (500 mg total) by mouth 4 (four) times daily.  . Cholecalciferol (VITAMIN D) 50 MCG (2000 UT) CAPS  Take 1 capsule by mouth daily.  Marland Kitchen ELIQUIS 2.5 MG TABS tablet TAKE 1 TABLET(2.5 MG) BY MOUTH TWICE DAILY  . HYDROcodone-acetaminophen (NORCO/VICODIN) 5-325 MG tablet Take 1 tablet by mouth every 4 (four) hours as needed for moderate pain.  . Insulin Pen Needle (PEN NEEDLES) 31G X 8 MM MISC 1 each by Does not apply route 4 (four) times daily.  . metolazone (ZAROXOLYN) 2.5 MG tablet TAKE ONE TABLET BY MOUTH TWICE A WEEK AS NEEDED FOR LEG SWELLING, PLEASE DO NOT TAKE THIS NO MORE THAN TWICE WEEKLY, ALSO STOP FERROUS  SULFA  . metoprolol succinate (TOPROL-XL) 50 MG 24 hr tablet Take 50 mg by mouth at bedtime.  Marland Kitchen NOVOLOG FLEXPEN 100 UNIT/ML FlexPen ADMINISTER 10 TO 16 UNITS UNDER THE SKIN THREE TIMES DAILY WITH MEALS  . potassium chloride (KLOR-CON) 10 MEQ tablet Take 10 mEq by mouth daily.  . rosuvastatin (CRESTOR) 20 MG tablet Take 1 tablet (20 mg total) by mouth daily.  Marland Kitchen torsemide (DEMADEX) 20 MG tablet Take 5 tablets (100 mg total) by mouth in the morning. & 3 tablets (60 mg total) by mouth in the afternoon  . TOUJEO SOLOSTAR 300 UNIT/ML SOPN INJECT 60 UNITS INTO THE SKIN AT BEDTIME     Allergies:   Patient has no known allergies.   ROS:  Leg pain and swelling.   Prior CV studies:   The following studies were reviewed today:  Echocardiogram2/06/2017: Study Conclusions  - Left ventricle: The cavity size was normal. Wall thickness was increased in a pattern of mild LVH. Severe hypokinesis of the mid anteroseptal wall, apical septal wall, true apex, mid to apical anterior wall. Indeterminant diastolic function (atrial fibrillation). The estimated ejection fraction was 45%. - Aortic valve: There was no stenosis. - Mitral valve: Mildly to moderately calcified annulus. There was trivial regurgitation. - Left atrium: The atrium was mildly to moderately dilated. - Right ventricle: The cavity size was normal. Systolic function was normal. - Pulmonary arteries: No complete TR doppler jet so unable to estimate PA systolic pressure. - Systemic veins: IVC measured 2.3 cm with < 50% respirophasic variation, suggesting RA pressure 15 mmHg.  Impressions:  - Normal LV size with mild LV hypertrophy. EF 45% with wall motion abnormalities as described above, in LAD coronary distribution. Normal RV size and systolic function. No significant valvular abnormalities.  Cardiac catheterization 09/03/2016: Conclusions: 1. Multivessel CAD, including 90% mid LAD stenosis involving  small diagonal branch, 70% ostial/proximal ramus lesion, 30% OM stenosis and sequential 70-80% proximal/mid RCA stenoses. 2. Mildly elevated left ventricular filling pressure. 3. Mild to moderate aortic valve gradient, which is suboptimally evaluated due to heart rate variability related to atrial fibrillation. 4. Right radial artery stenosis, which is likely a combination of fixed disease and vasospasm. Stenosis was successfully navigated with angled micropuncture and Versacore wires.  Recommendations: 1. Medical optimization, including further diuresis and close monitoring of renal function. 2. Plan for stage PCI to mid LAD and proximal/mid RCA next week when volume status has improved and renal function has improved/stabilized. 3. Restart heparin infusion 4 hours after TR band removal.  Cardiac PCI 09/08/2016:  CHIP due to renal insufficiency, long diffuse disease in LAD and right coronary, in an elderly diabetic patient felt to be a poor candidate for CABG. Greatest risk is acute kidney injury and access site bleeding. Contrast during the procedure 160 cc.Small hematoma present above cath site at completion of procedure.  Successful LAD provisional stenting with reduction in 90% stenosis to 0%  using a 28 x 2.75 Synergy DES postdilated to 3.0 mm in diameter. TIMI grade 2 flow was noted in the diagonal post procedure as was a case prior to the procedure.  Successful proximal to mid RCA stenting with overlapping 3.5 Promus Premier 24 and 8 mm stents. Stents were postdilated to 4.0 mm reducing stenosis to 0% with TIMI grade 3 flow.  Labs/Other Tests and Data Reviewed:    EKG:  An ECG dated 10/22/2019 was personally reviewed today and demonstrated:  Rate controlled atrial fibrillation with possible old anteroseptal infarct pattern.  Recent Labs: 01/30/2020: ALT 21; BUN 77; Creatinine, Ser 3.13; Hemoglobin 9.6; Platelets 248; Potassium 3.6; Sodium 139    Wt Readings from Last 3 Encounters:    02/04/20 269 lb (122 kg)  01/30/20 275 lb 9.2 oz (125 kg)  11/26/19 276 lb (125.2 kg)     Objective:    Vital Signs:  Ht 5\' 6"  (1.676 m)   Wt 269 lb (122 kg)   BMI 43.42 kg/m    Patient spoke in full sentences, not short of breath on the phone.  ASSESSMENT & PLAN:    1.  Bilateral lymphedema.  Patient reports continued leg swelling despite high-dose Demadex, recently diagnosed with cellulitis as well and treated with antibiotics following ER visit in mid July.  Now that she is in [place], hopefully she can continue with PT for lymphedema treatments, I suspect she may need higher standing dose diuretics.  2.  CKD stage IV, continues to follow with Dr. Theador Hawthorne.  Recent lab work shows BUN 77 creatinine 3.13.  3.  Permanent atrial fibrillation, she remains on Eliquis without sense of palpitations and Eliquis at 2.5 mg twice daily.   Time:   Today, I have spent 8 minutes with the patient with telehealth technology discussing the above problems.     Medication Adjustments/Labs and Tests Ordered: Current medicines are reviewed at length with the patient today.  Concerns regarding medicines are outlined above.   Tests Ordered: No orders of the defined types were placed in this encounter.   Medication Changes: No orders of the defined types were placed in this encounter.   Follow Up:  Virtual Visit  2 months.  Signed, Rozann Lesches, MD  02/04/2020 11:21 AM    [place]

## 2020-02-04 NOTE — Patient Instructions (Signed)
Medication Instructions:  Continue all current medications.  Labwork: none  Testing/Procedures: none  Follow-Up: 2 months   Any Other Special Instructions Will Be Listed Below (If Applicable).  If you need a refill on your cardiac medications before your next appointment, please call your pharmacy.  

## 2020-02-05 DIAGNOSIS — I482 Chronic atrial fibrillation, unspecified: Secondary | ICD-10-CM | POA: Diagnosis not present

## 2020-02-05 DIAGNOSIS — Z7982 Long term (current) use of aspirin: Secondary | ICD-10-CM | POA: Diagnosis not present

## 2020-02-05 DIAGNOSIS — Z6841 Body Mass Index (BMI) 40.0 and over, adult: Secondary | ICD-10-CM | POA: Diagnosis not present

## 2020-02-05 DIAGNOSIS — I2510 Atherosclerotic heart disease of native coronary artery without angina pectoris: Secondary | ICD-10-CM | POA: Diagnosis not present

## 2020-02-05 DIAGNOSIS — E782 Mixed hyperlipidemia: Secondary | ICD-10-CM | POA: Diagnosis not present

## 2020-02-05 DIAGNOSIS — Z7901 Long term (current) use of anticoagulants: Secondary | ICD-10-CM | POA: Diagnosis not present

## 2020-02-05 DIAGNOSIS — I872 Venous insufficiency (chronic) (peripheral): Secondary | ICD-10-CM | POA: Diagnosis not present

## 2020-02-05 DIAGNOSIS — Z794 Long term (current) use of insulin: Secondary | ICD-10-CM | POA: Diagnosis not present

## 2020-02-05 DIAGNOSIS — D649 Anemia, unspecified: Secondary | ICD-10-CM | POA: Diagnosis not present

## 2020-02-05 DIAGNOSIS — I890 Lymphedema, not elsewhere classified: Secondary | ICD-10-CM | POA: Diagnosis not present

## 2020-02-05 DIAGNOSIS — I509 Heart failure, unspecified: Secondary | ICD-10-CM | POA: Diagnosis not present

## 2020-02-05 DIAGNOSIS — I110 Hypertensive heart disease with heart failure: Secondary | ICD-10-CM | POA: Diagnosis not present

## 2020-02-05 DIAGNOSIS — E1151 Type 2 diabetes mellitus with diabetic peripheral angiopathy without gangrene: Secondary | ICD-10-CM | POA: Diagnosis not present

## 2020-02-05 DIAGNOSIS — M109 Gout, unspecified: Secondary | ICD-10-CM | POA: Diagnosis not present

## 2020-02-06 DIAGNOSIS — L0390 Cellulitis, unspecified: Secondary | ICD-10-CM | POA: Diagnosis not present

## 2020-02-06 DIAGNOSIS — I890 Lymphedema, not elsewhere classified: Secondary | ICD-10-CM | POA: Diagnosis not present

## 2020-02-13 DIAGNOSIS — L0390 Cellulitis, unspecified: Secondary | ICD-10-CM | POA: Diagnosis not present

## 2020-02-13 DIAGNOSIS — R350 Frequency of micturition: Secondary | ICD-10-CM | POA: Diagnosis not present

## 2020-02-15 DIAGNOSIS — E11621 Type 2 diabetes mellitus with foot ulcer: Secondary | ICD-10-CM | POA: Diagnosis not present

## 2020-02-15 DIAGNOSIS — L89321 Pressure ulcer of left buttock, stage 1: Secondary | ICD-10-CM | POA: Diagnosis not present

## 2020-02-15 DIAGNOSIS — I5033 Acute on chronic diastolic (congestive) heart failure: Secondary | ICD-10-CM | POA: Diagnosis not present

## 2020-02-15 DIAGNOSIS — I110 Hypertensive heart disease with heart failure: Secondary | ICD-10-CM | POA: Diagnosis not present

## 2020-02-20 DIAGNOSIS — I2510 Atherosclerotic heart disease of native coronary artery without angina pectoris: Secondary | ICD-10-CM | POA: Diagnosis not present

## 2020-02-20 DIAGNOSIS — Z7982 Long term (current) use of aspirin: Secondary | ICD-10-CM | POA: Diagnosis not present

## 2020-02-20 DIAGNOSIS — I110 Hypertensive heart disease with heart failure: Secondary | ICD-10-CM | POA: Diagnosis not present

## 2020-02-20 DIAGNOSIS — I890 Lymphedema, not elsewhere classified: Secondary | ICD-10-CM | POA: Diagnosis not present

## 2020-02-20 DIAGNOSIS — E1151 Type 2 diabetes mellitus with diabetic peripheral angiopathy without gangrene: Secondary | ICD-10-CM | POA: Diagnosis not present

## 2020-02-20 DIAGNOSIS — Z7901 Long term (current) use of anticoagulants: Secondary | ICD-10-CM | POA: Diagnosis not present

## 2020-02-20 DIAGNOSIS — M109 Gout, unspecified: Secondary | ICD-10-CM | POA: Diagnosis not present

## 2020-02-20 DIAGNOSIS — I509 Heart failure, unspecified: Secondary | ICD-10-CM | POA: Diagnosis not present

## 2020-02-20 DIAGNOSIS — D649 Anemia, unspecified: Secondary | ICD-10-CM | POA: Diagnosis not present

## 2020-02-20 DIAGNOSIS — Z6841 Body Mass Index (BMI) 40.0 and over, adult: Secondary | ICD-10-CM | POA: Diagnosis not present

## 2020-02-20 DIAGNOSIS — Z794 Long term (current) use of insulin: Secondary | ICD-10-CM | POA: Diagnosis not present

## 2020-02-20 DIAGNOSIS — I482 Chronic atrial fibrillation, unspecified: Secondary | ICD-10-CM | POA: Diagnosis not present

## 2020-02-20 DIAGNOSIS — E782 Mixed hyperlipidemia: Secondary | ICD-10-CM | POA: Diagnosis not present

## 2020-02-20 DIAGNOSIS — I872 Venous insufficiency (chronic) (peripheral): Secondary | ICD-10-CM | POA: Diagnosis not present

## 2020-02-27 DIAGNOSIS — L0390 Cellulitis, unspecified: Secondary | ICD-10-CM | POA: Diagnosis not present

## 2020-02-27 DIAGNOSIS — E1122 Type 2 diabetes mellitus with diabetic chronic kidney disease: Secondary | ICD-10-CM | POA: Diagnosis not present

## 2020-02-27 DIAGNOSIS — I890 Lymphedema, not elsewhere classified: Secondary | ICD-10-CM | POA: Diagnosis not present

## 2020-02-27 DIAGNOSIS — E1165 Type 2 diabetes mellitus with hyperglycemia: Secondary | ICD-10-CM | POA: Diagnosis not present

## 2020-03-06 DIAGNOSIS — Z7901 Long term (current) use of anticoagulants: Secondary | ICD-10-CM | POA: Diagnosis not present

## 2020-03-06 DIAGNOSIS — I110 Hypertensive heart disease with heart failure: Secondary | ICD-10-CM | POA: Diagnosis not present

## 2020-03-06 DIAGNOSIS — I482 Chronic atrial fibrillation, unspecified: Secondary | ICD-10-CM | POA: Diagnosis not present

## 2020-03-06 DIAGNOSIS — D649 Anemia, unspecified: Secondary | ICD-10-CM | POA: Diagnosis not present

## 2020-03-06 DIAGNOSIS — N185 Chronic kidney disease, stage 5: Secondary | ICD-10-CM | POA: Diagnosis not present

## 2020-03-06 DIAGNOSIS — I2510 Atherosclerotic heart disease of native coronary artery without angina pectoris: Secondary | ICD-10-CM | POA: Diagnosis not present

## 2020-03-06 DIAGNOSIS — Z794 Long term (current) use of insulin: Secondary | ICD-10-CM | POA: Diagnosis not present

## 2020-03-06 DIAGNOSIS — Z6841 Body Mass Index (BMI) 40.0 and over, adult: Secondary | ICD-10-CM | POA: Diagnosis not present

## 2020-03-06 DIAGNOSIS — I129 Hypertensive chronic kidney disease with stage 1 through stage 4 chronic kidney disease, or unspecified chronic kidney disease: Secondary | ICD-10-CM | POA: Diagnosis not present

## 2020-03-06 DIAGNOSIS — M109 Gout, unspecified: Secondary | ICD-10-CM | POA: Diagnosis not present

## 2020-03-06 DIAGNOSIS — I872 Venous insufficiency (chronic) (peripheral): Secondary | ICD-10-CM | POA: Diagnosis not present

## 2020-03-06 DIAGNOSIS — E211 Secondary hyperparathyroidism, not elsewhere classified: Secondary | ICD-10-CM | POA: Diagnosis not present

## 2020-03-06 DIAGNOSIS — I509 Heart failure, unspecified: Secondary | ICD-10-CM | POA: Diagnosis not present

## 2020-03-06 DIAGNOSIS — I5022 Chronic systolic (congestive) heart failure: Secondary | ICD-10-CM | POA: Diagnosis not present

## 2020-03-06 DIAGNOSIS — E1122 Type 2 diabetes mellitus with diabetic chronic kidney disease: Secondary | ICD-10-CM | POA: Diagnosis not present

## 2020-03-06 DIAGNOSIS — E782 Mixed hyperlipidemia: Secondary | ICD-10-CM | POA: Diagnosis not present

## 2020-03-06 DIAGNOSIS — E1129 Type 2 diabetes mellitus with other diabetic kidney complication: Secondary | ICD-10-CM | POA: Diagnosis not present

## 2020-03-06 DIAGNOSIS — E1151 Type 2 diabetes mellitus with diabetic peripheral angiopathy without gangrene: Secondary | ICD-10-CM | POA: Diagnosis not present

## 2020-03-06 DIAGNOSIS — Z7982 Long term (current) use of aspirin: Secondary | ICD-10-CM | POA: Diagnosis not present

## 2020-03-06 DIAGNOSIS — R809 Proteinuria, unspecified: Secondary | ICD-10-CM | POA: Diagnosis not present

## 2020-03-06 DIAGNOSIS — I890 Lymphedema, not elsewhere classified: Secondary | ICD-10-CM | POA: Diagnosis not present

## 2020-03-06 DIAGNOSIS — E872 Acidosis: Secondary | ICD-10-CM | POA: Diagnosis not present

## 2020-03-06 DIAGNOSIS — D638 Anemia in other chronic diseases classified elsewhere: Secondary | ICD-10-CM | POA: Diagnosis not present

## 2020-03-11 ENCOUNTER — Other Ambulatory Visit: Payer: Self-pay | Admitting: Cardiology

## 2020-03-11 DIAGNOSIS — I5022 Chronic systolic (congestive) heart failure: Secondary | ICD-10-CM | POA: Diagnosis not present

## 2020-03-11 DIAGNOSIS — E1122 Type 2 diabetes mellitus with diabetic chronic kidney disease: Secondary | ICD-10-CM | POA: Diagnosis not present

## 2020-03-11 DIAGNOSIS — R809 Proteinuria, unspecified: Secondary | ICD-10-CM | POA: Diagnosis not present

## 2020-03-11 DIAGNOSIS — N186 End stage renal disease: Secondary | ICD-10-CM | POA: Diagnosis not present

## 2020-03-11 DIAGNOSIS — E211 Secondary hyperparathyroidism, not elsewhere classified: Secondary | ICD-10-CM | POA: Diagnosis not present

## 2020-03-11 DIAGNOSIS — D638 Anemia in other chronic diseases classified elsewhere: Secondary | ICD-10-CM | POA: Diagnosis not present

## 2020-03-11 DIAGNOSIS — E872 Acidosis: Secondary | ICD-10-CM | POA: Diagnosis not present

## 2020-03-11 DIAGNOSIS — N185 Chronic kidney disease, stage 5: Secondary | ICD-10-CM | POA: Diagnosis not present

## 2020-03-11 NOTE — Telephone Encounter (Signed)
     1. Which medications need to be refilled? (please list name of each medication and dose if known)   metoprolol succinate (TOPROL-XL) 50    2. Which pharmacy/location (including street and city if local pharmacy) is medication to be sent to?  [place]   Fax # [phone]    Phone # [phone]  3. Do they need a 30 day or 90 day supply?  [place]

## 2020-03-12 MED ORDER — METOPROLOL SUCCINATE ER 50 MG PO TB24: 50.0000 mg | ORAL_TABLET | Freq: Every day | ORAL | 6 refills | Status: DC

## 2020-03-12 NOTE — Telephone Encounter (Signed)
Done

## 2020-03-18 DIAGNOSIS — L89321 Pressure ulcer of left buttock, stage 1: Secondary | ICD-10-CM | POA: Diagnosis not present

## 2020-03-18 DIAGNOSIS — E11621 Type 2 diabetes mellitus with foot ulcer: Secondary | ICD-10-CM | POA: Diagnosis not present

## 2020-03-18 DIAGNOSIS — L97509 Non-pressure chronic ulcer of other part of unspecified foot with unspecified severity: Secondary | ICD-10-CM | POA: Diagnosis not present

## 2020-03-18 DIAGNOSIS — I110 Hypertensive heart disease with heart failure: Secondary | ICD-10-CM | POA: Diagnosis not present

## 2020-03-18 DIAGNOSIS — I5033 Acute on chronic diastolic (congestive) heart failure: Secondary | ICD-10-CM | POA: Diagnosis not present

## 2020-03-19 DIAGNOSIS — N184 Chronic kidney disease, stage 4 (severe): Secondary | ICD-10-CM | POA: Diagnosis not present

## 2020-03-19 DIAGNOSIS — D508 Other iron deficiency anemias: Secondary | ICD-10-CM | POA: Diagnosis not present

## 2020-03-19 DIAGNOSIS — D631 Anemia in chronic kidney disease: Secondary | ICD-10-CM | POA: Diagnosis not present

## 2020-03-21 DIAGNOSIS — N170 Acute kidney failure with tubular necrosis: Secondary | ICD-10-CM | POA: Diagnosis not present

## 2020-03-21 DIAGNOSIS — I5022 Chronic systolic (congestive) heart failure: Secondary | ICD-10-CM | POA: Diagnosis not present

## 2020-03-21 DIAGNOSIS — R809 Proteinuria, unspecified: Secondary | ICD-10-CM | POA: Diagnosis not present

## 2020-03-21 DIAGNOSIS — D638 Anemia in other chronic diseases classified elsewhere: Secondary | ICD-10-CM | POA: Diagnosis not present

## 2020-03-21 DIAGNOSIS — D508 Other iron deficiency anemias: Secondary | ICD-10-CM | POA: Diagnosis not present

## 2020-03-21 DIAGNOSIS — E1129 Type 2 diabetes mellitus with other diabetic kidney complication: Secondary | ICD-10-CM | POA: Diagnosis not present

## 2020-03-21 DIAGNOSIS — I129 Hypertensive chronic kidney disease with stage 1 through stage 4 chronic kidney disease, or unspecified chronic kidney disease: Secondary | ICD-10-CM | POA: Diagnosis not present

## 2020-03-21 DIAGNOSIS — N185 Chronic kidney disease, stage 5: Secondary | ICD-10-CM | POA: Diagnosis not present

## 2020-03-21 DIAGNOSIS — E1122 Type 2 diabetes mellitus with diabetic chronic kidney disease: Secondary | ICD-10-CM | POA: Diagnosis not present

## 2020-03-21 DIAGNOSIS — E212 Other hyperparathyroidism: Secondary | ICD-10-CM | POA: Diagnosis not present

## 2020-03-26 DIAGNOSIS — N184 Chronic kidney disease, stage 4 (severe): Secondary | ICD-10-CM | POA: Diagnosis not present

## 2020-03-26 DIAGNOSIS — D508 Other iron deficiency anemias: Secondary | ICD-10-CM | POA: Diagnosis not present

## 2020-03-26 DIAGNOSIS — D631 Anemia in chronic kidney disease: Secondary | ICD-10-CM | POA: Diagnosis not present

## 2020-04-02 DIAGNOSIS — D631 Anemia in chronic kidney disease: Secondary | ICD-10-CM | POA: Diagnosis not present

## 2020-04-02 DIAGNOSIS — N184 Chronic kidney disease, stage 4 (severe): Secondary | ICD-10-CM | POA: Diagnosis not present

## 2020-04-04 ENCOUNTER — Telehealth: Payer: Self-pay | Admitting: Cardiology

## 2020-04-04 DIAGNOSIS — I872 Venous insufficiency (chronic) (peripheral): Secondary | ICD-10-CM | POA: Diagnosis not present

## 2020-04-04 DIAGNOSIS — I5023 Acute on chronic systolic (congestive) heart failure: Secondary | ICD-10-CM | POA: Diagnosis not present

## 2020-04-04 DIAGNOSIS — I110 Hypertensive heart disease with heart failure: Secondary | ICD-10-CM | POA: Diagnosis not present

## 2020-04-04 DIAGNOSIS — D638 Anemia in other chronic diseases classified elsewhere: Secondary | ICD-10-CM | POA: Diagnosis not present

## 2020-04-04 DIAGNOSIS — N170 Acute kidney failure with tubular necrosis: Secondary | ICD-10-CM | POA: Diagnosis not present

## 2020-04-04 DIAGNOSIS — N185 Chronic kidney disease, stage 5: Secondary | ICD-10-CM | POA: Diagnosis not present

## 2020-04-04 DIAGNOSIS — I2510 Atherosclerotic heart disease of native coronary artery without angina pectoris: Secondary | ICD-10-CM | POA: Diagnosis not present

## 2020-04-04 DIAGNOSIS — E876 Hypokalemia: Secondary | ICD-10-CM | POA: Diagnosis not present

## 2020-04-04 DIAGNOSIS — E782 Mixed hyperlipidemia: Secondary | ICD-10-CM | POA: Diagnosis not present

## 2020-04-04 DIAGNOSIS — I890 Lymphedema, not elsewhere classified: Secondary | ICD-10-CM | POA: Diagnosis not present

## 2020-04-04 DIAGNOSIS — I509 Heart failure, unspecified: Secondary | ICD-10-CM | POA: Diagnosis not present

## 2020-04-04 DIAGNOSIS — I482 Chronic atrial fibrillation, unspecified: Secondary | ICD-10-CM | POA: Diagnosis not present

## 2020-04-04 DIAGNOSIS — E1151 Type 2 diabetes mellitus with diabetic peripheral angiopathy without gangrene: Secondary | ICD-10-CM | POA: Diagnosis not present

## 2020-04-04 DIAGNOSIS — E1122 Type 2 diabetes mellitus with diabetic chronic kidney disease: Secondary | ICD-10-CM | POA: Diagnosis not present

## 2020-04-04 NOTE — Telephone Encounter (Signed)
Patient called stating that she is extremely short of breath, but her Oxygen level is 98 and her heart rate goes up to 124, she says she has to sit down or she feels like she's going to pass out. States no chest pain   [phone]

## 2020-04-04 NOTE — Telephone Encounter (Signed)
Reports worsening SOB for one week. Reports SOB even after drinking water. SOB noted while on phone with patient when speaking. Currently stays at [facility] and was weighed one week ago (266 lbs). Reports having lymphedema and is unable to determine if swelling is worse. Denies chest pain or dizziness. Advised patient that she needed an ED evaluation. Patient agreed and verbalized understanding of plan.

## 2020-04-06 ENCOUNTER — Encounter: Payer: Self-pay | Admitting: Cardiology

## 2020-04-06 NOTE — Progress Notes (Signed)
Virtual Visit via Telephone Note   This visit type was conducted due to national recommendations for restrictions regarding the COVID-19 Pandemic (e.g. social distancing) in an effort to limit this patient's exposure and mitigate transmission in our community.  Due to her co-morbid illnesses, this patient is at least at moderate risk for complications without adequate follow up.  This format is felt to be most appropriate for this patient at this time.  The patient did not have access to video technology/had technical difficulties with video requiring transitioning to audio format only (telephone).  All issues noted in this document were discussed and addressed.  No physical exam could be performed with this format.  Please refer to the patient's chart for her  consent to telehealth for [facility].   Date:  04/07/2020   ID:  Kristina Howell, DOB [birth year], MRN [id number] The patient was identified using 2 identifiers.   Patient Location: [place] Provider Location: Office/Clinic  PCP:  Rosalee Kaufman, PA-C  Cardiologist:  Rozann Lesches, MD Electrophysiologist:  None   Evaluation Performed:  Follow-Up Visit  Chief Complaint:  Cardiac follow-up  History of Present Illness:    Kristina Howell is an 84 y.o. female last assessed via telehealth encounter in July.  We spoke by phone today.  She tells me that she has been short of breath for the last several weeks, weight has been gradually increasing as reflected in the vital signs below.  She feels like she is retaining more fluid, lymphedema is also worse. She follows with Dr. Theador Hawthorne for management of chronic renal disease. I note that her Demadex was recently increased to 100 mg twice daily. She has CKD stage V.  She has declined hemodialysis so far.  She currently resides at [facility].  Records indicate ER evaluation in July and treatment for cellulitis.  I reviewed her medications which are listed  below.   Past Medical History:  Diagnosis Date  . Atrial fibrillation ([place])   . CAD (coronary artery disease)    Multivessel, DES to LAD and RCA 08/2016  . Cardiomyopathy (HCC)    LVEF 45%  . CKD (chronic kidney disease) stage 5, GFR less than 15 ml/min (HCC)   . Essential hypertension   . Gout   . Pancreatitis, recurrent   . Type 2 diabetes mellitus ([place])    Past Surgical History:  Procedure Laterality Date  . CHOLECYSTECTOMY    . CORONARY STENT INTERVENTION N/A 09/08/2016   Procedure: Coronary Stent Intervention;  Surgeon: Belva Crome, MD;  Location: [facility];  Service: Cardiovascular;  Laterality: N/A;  . LEFT HEART CATH AND CORONARY ANGIOGRAPHY N/A 09/03/2016   Procedure: Left Heart Cath and Coronary Angiography;  Surgeon: Nelva Bush, MD;  Location: [facility];  Service: Cardiovascular;  Laterality: N/A;  . TONSILLECTOMY       Current Meds  Medication Sig  . allopurinol (ZYLOPRIM) 300 MG tablet Take 300 mg by mouth daily.  Marland Kitchen amLODipine (NORVASC) 5 MG tablet Take 1 tablet (5 mg total) by mouth daily.  Marland Kitchen aspirin EC 81 MG tablet Take 81 mg by mouth daily.  . benazepril (LOTENSIN) 40 MG tablet Take 40 mg by mouth daily.  Marland Kitchen ELIQUIS 2.5 MG TABS tablet TAKE 1 TABLET(2.5 MG) BY MOUTH TWICE DAILY  . Insulin Pen Needle (PEN NEEDLES) 31G X 8 MM MISC 1 each by Does not apply route 4 (four) times daily.  . metoprolol succinate (TOPROL-XL) 50 MG 24 hr tablet  Take 1 tablet (50 mg total) by mouth at bedtime.  Marland Kitchen NOVOLOG FLEXPEN 100 UNIT/ML FlexPen ADMINISTER 10 TO 16 UNITS UNDER THE SKIN THREE TIMES DAILY WITH MEALS  . potassium chloride (KLOR-CON) 10 MEQ tablet Take 10 mEq by mouth daily.  . rosuvastatin (CRESTOR) 20 MG tablet Take 1 tablet (20 mg total) by mouth daily.  . sodium bicarbonate 650 MG tablet Take 650 mg by mouth 3 (three) times daily.  Marland Kitchen torsemide (DEMADEX) 20 MG tablet Take 5 tablets (100 mg total) by mouth in the morning. & 3 tablets (60 mg total) by  mouth in the afternoon (Patient taking differently: Take 100 mg by mouth in the morning. )  . TOUJEO SOLOSTAR 300 UNIT/ML SOPN INJECT 60 UNITS INTO THE SKIN AT BEDTIME  . traMADol (ULTRAM) 50 MG tablet Take by mouth every 6 (six) hours as needed.     Allergies:   Patient has no known allergies.   ROS:  No palpitations or angina.  Prior CV studies:   The following studies were reviewed today:  Echocardiogram2/06/2017: Study Conclusions  - Left ventricle: The cavity size was normal. Wall thickness was increased in a pattern of mild LVH. Severe hypokinesis of the mid anteroseptal wall, apical septal wall, true apex, mid to apical anterior wall. Indeterminant diastolic function (atrial fibrillation). The estimated ejection fraction was 45%. - Aortic valve: There was no stenosis. - Mitral valve: Mildly to moderately calcified annulus. There was trivial regurgitation. - Left atrium: The atrium was mildly to moderately dilated. - Right ventricle: The cavity size was normal. Systolic function was normal. - Pulmonary arteries: No complete TR doppler jet so unable to estimate PA systolic pressure. - Systemic veins: IVC measured 2.3 cm with < 50% respirophasic variation, suggesting RA pressure 15 mmHg.  Impressions:  - Normal LV size with mild LV hypertrophy. EF 45% with wall motion abnormalities as described above, in LAD coronary distribution. Normal RV size and systolic function. No significant valvular abnormalities.  Cardiac catheterization 09/03/2016: Conclusions: 1. Multivessel CAD, including 90% mid LAD stenosis involving small diagonal branch, 70% ostial/proximal ramus lesion, 30% OM stenosis and sequential 70-80% proximal/mid RCA stenoses. 2. Mildly elevated left ventricular filling pressure. 3. Mild to moderate aortic valve gradient, which is suboptimally evaluated due to heart rate variability related to atrial fibrillation. 4. Right radial artery  stenosis, which is likely a combination of fixed disease and vasospasm. Stenosis was successfully navigated with angled micropuncture and Versacore wires.  Recommendations: 1. Medical optimization, including further diuresis and close monitoring of renal function. 2. Plan for stage PCI to mid LAD and proximal/mid RCA next week when volume status has improved and renal function has improved/stabilized. 3. Restart heparin infusion 4 hours after TR band removal.  Cardiac PCI 09/08/2016:  CHIP due to renal insufficiency, long diffuse disease in LAD and right coronary, in an elderly diabetic patient felt to be a poor candidate for CABG. Greatest risk is acute kidney injury and access site bleeding. Contrast during the procedure 160 cc.Small hematoma present above cath site at completion of procedure.  Successful LAD provisional stenting with reduction in 90% stenosis to 0% using a 28 x 2.75 Synergy DES postdilated to 3.0 mm in diameter. TIMI grade 2 flow was noted in the diagonal post procedure as was a case prior to the procedure.  Successful proximal to mid RCA stenting with overlapping 3.5 Promus Premier 24 and 8 mm stents. Stents were postdilated to 4.0 mm reducing stenosis to 0% with TIMI grade 3  flow.  Labs/Other Tests and Data Reviewed:    EKG:  An ECG dated 10/22/2019 was personally reviewed today and demonstrated:  Rate controlled atrial fibrillation with nonspecific ST changes, anteroseptal Q waves.  Recent Labs: 01/30/2020: ALT 21; BUN 77; Creatinine, Ser 3.13; Hemoglobin 9.6; Platelets 248; Potassium 3.6; Sodium 139  September 2021: BUN 60, creatinine 3.11, potassium 3.2, GFR 13, hemoglobin 8.0  Wt Readings from Last 3 Encounters:  04/07/20 283 lb (128.4 kg)  02/04/20 269 lb (122 kg)  01/30/20 275 lb 9.2 oz (125 kg)     Objective:    Vital Signs:  BP (!) 155/90   Pulse 88   Resp 20   Wt 283 lb (128.4 kg)   SpO2 99%   BMI 45.68 kg/m    Patient short of breath speaking in  long sentences.  No wheezing.  ASSESSMENT & PLAN:    1.  Fluid overload with acute on chronic combined heart failure and also worsening renal disease, CKD stage V at this point.  Demadex was just increased by Dr. Theador Hawthorne, currently 100 mg twice daily.  If this is not effective, I did explain to Mrs. Nuckles that she would likely need to be hospitalized for IV diuretics and further medication adjustments.  2.  CKD stage V, follows with Dr. Theador Hawthorne.  She has declined hemodialysis so far.  3.  Permanent atrial fibrillation, remains on Eliquis, no spontaneous bleeding problems reported.  She does not describe any palpitations on Toprol-XL.  4.  Lymphedema.  Time:   Today, I have spent 8 minutes with the patient with telehealth technology discussing the above problems.     Medication Adjustments/Labs and Tests Ordered: Current medicines are reviewed at length with the patient today.  Concerns regarding medicines are outlined above.   Tests Ordered: No orders of the defined types were placed in this encounter.   Medication Changes: No orders of the defined types were placed in this encounter.   Follow Up:  Virtual Visit  1 month.  Signed, Rozann Lesches, MD  04/07/2020 9:01 AM    [place]

## 2020-04-07 ENCOUNTER — Telehealth (INDEPENDENT_AMBULATORY_CARE_PROVIDER_SITE_OTHER): Payer: PPO | Admitting: Cardiology

## 2020-04-07 ENCOUNTER — Encounter: Payer: Self-pay | Admitting: Cardiology

## 2020-04-07 VITALS — BP 155/90 | HR 88 | Resp 20 | Wt 283.0 lb

## 2020-04-07 DIAGNOSIS — I4821 Permanent atrial fibrillation: Secondary | ICD-10-CM | POA: Diagnosis not present

## 2020-04-07 DIAGNOSIS — N185 Chronic kidney disease, stage 5: Secondary | ICD-10-CM | POA: Diagnosis not present

## 2020-04-07 DIAGNOSIS — I25119 Atherosclerotic heart disease of native coronary artery with unspecified angina pectoris: Secondary | ICD-10-CM | POA: Diagnosis not present

## 2020-04-07 DIAGNOSIS — I890 Lymphedema, not elsewhere classified: Secondary | ICD-10-CM | POA: Diagnosis not present

## 2020-04-07 DIAGNOSIS — I5043 Acute on chronic combined systolic (congestive) and diastolic (congestive) heart failure: Secondary | ICD-10-CM | POA: Diagnosis not present

## 2020-04-07 NOTE — Patient Instructions (Addendum)
Your physician recommends that you schedule a follow-up appointment in: [place] [address] [place] physician recommends that you continue on your current medications as directed. Please refer to the Current Medication list given to you today.  Thank you for choosing [facility]!!

## 2020-04-08 ENCOUNTER — Other Ambulatory Visit: Payer: Self-pay

## 2020-04-08 ENCOUNTER — Encounter (HOSPITAL_COMMUNITY): Payer: Self-pay

## 2020-04-08 ENCOUNTER — Emergency Department (HOSPITAL_COMMUNITY): Payer: PPO

## 2020-04-08 ENCOUNTER — Inpatient Hospital Stay (HOSPITAL_COMMUNITY)
Admission: EM | Admit: 2020-04-08 | Discharge: 2020-04-14 | DRG: 291 | Disposition: A | Discharge status: Skilled Nursing Facility | Payer: PPO | Attending: Family Medicine | Admitting: Family Medicine

## 2020-04-08 DIAGNOSIS — I5033 Acute on chronic diastolic (congestive) heart failure: Secondary | ICD-10-CM | POA: Diagnosis present

## 2020-04-08 DIAGNOSIS — I429 Cardiomyopathy, unspecified: Secondary | ICD-10-CM | POA: Diagnosis not present

## 2020-04-08 DIAGNOSIS — Z7901 Long term (current) use of anticoagulants: Secondary | ICD-10-CM | POA: Diagnosis not present

## 2020-04-08 DIAGNOSIS — I132 Hypertensive heart and chronic kidney disease with heart failure and with stage 5 chronic kidney disease, or end stage renal disease: Principal | ICD-10-CM | POA: Diagnosis present

## 2020-04-08 DIAGNOSIS — E785 Hyperlipidemia, unspecified: Secondary | ICD-10-CM | POA: Diagnosis not present

## 2020-04-08 DIAGNOSIS — J90 Pleural effusion, not elsewhere classified: Secondary | ICD-10-CM | POA: Diagnosis not present

## 2020-04-08 DIAGNOSIS — I509 Heart failure, unspecified: Secondary | ICD-10-CM | POA: Diagnosis not present

## 2020-04-08 DIAGNOSIS — N179 Acute kidney failure, unspecified: Secondary | ICD-10-CM | POA: Diagnosis present

## 2020-04-08 DIAGNOSIS — N189 Chronic kidney disease, unspecified: Secondary | ICD-10-CM

## 2020-04-08 DIAGNOSIS — Z794 Long term (current) use of insulin: Secondary | ICD-10-CM

## 2020-04-08 DIAGNOSIS — I2510 Atherosclerotic heart disease of native coronary artery without angina pectoris: Secondary | ICD-10-CM | POA: Diagnosis not present

## 2020-04-08 DIAGNOSIS — I130 Hypertensive heart and chronic kidney disease with heart failure and stage 1 through stage 4 chronic kidney disease, or unspecified chronic kidney disease: Secondary | ICD-10-CM | POA: Diagnosis not present

## 2020-04-08 DIAGNOSIS — E782 Mixed hyperlipidemia: Secondary | ICD-10-CM | POA: Diagnosis present

## 2020-04-08 DIAGNOSIS — J9601 Acute respiratory failure with hypoxia: Secondary | ICD-10-CM | POA: Diagnosis not present

## 2020-04-08 DIAGNOSIS — I890 Lymphedema, not elsewhere classified: Secondary | ICD-10-CM

## 2020-04-08 DIAGNOSIS — E876 Hypokalemia: Secondary | ICD-10-CM | POA: Diagnosis present

## 2020-04-08 DIAGNOSIS — Z6841 Body Mass Index (BMI) 40.0 and over, adult: Secondary | ICD-10-CM

## 2020-04-08 DIAGNOSIS — Z20822 Contact with and (suspected) exposure to covid-19: Secondary | ICD-10-CM | POA: Diagnosis not present

## 2020-04-08 DIAGNOSIS — I10 Essential (primary) hypertension: Secondary | ICD-10-CM | POA: Diagnosis not present

## 2020-04-08 DIAGNOSIS — R600 Localized edema: Secondary | ICD-10-CM | POA: Diagnosis not present

## 2020-04-08 DIAGNOSIS — Z79899 Other long term (current) drug therapy: Secondary | ICD-10-CM

## 2020-04-08 DIAGNOSIS — E1122 Type 2 diabetes mellitus with diabetic chronic kidney disease: Secondary | ICD-10-CM | POA: Diagnosis not present

## 2020-04-08 DIAGNOSIS — Z66 Do not resuscitate: Secondary | ICD-10-CM

## 2020-04-08 DIAGNOSIS — Z515 Encounter for palliative care: Secondary | ICD-10-CM | POA: Diagnosis not present

## 2020-04-08 DIAGNOSIS — K861 Other chronic pancreatitis: Secondary | ICD-10-CM | POA: Diagnosis not present

## 2020-04-08 DIAGNOSIS — Z7189 Other specified counseling: Secondary | ICD-10-CM | POA: Diagnosis not present

## 2020-04-08 DIAGNOSIS — I4819 Other persistent atrial fibrillation: Secondary | ICD-10-CM | POA: Diagnosis not present

## 2020-04-08 DIAGNOSIS — E1159 Type 2 diabetes mellitus with other circulatory complications: Secondary | ICD-10-CM | POA: Diagnosis not present

## 2020-04-08 DIAGNOSIS — Z7982 Long term (current) use of aspirin: Secondary | ICD-10-CM | POA: Diagnosis not present

## 2020-04-08 DIAGNOSIS — J811 Chronic pulmonary edema: Secondary | ICD-10-CM | POA: Diagnosis not present

## 2020-04-08 DIAGNOSIS — Z993 Dependence on wheelchair: Secondary | ICD-10-CM | POA: Diagnosis not present

## 2020-04-08 DIAGNOSIS — N185 Chronic kidney disease, stage 5: Secondary | ICD-10-CM | POA: Diagnosis not present

## 2020-04-08 DIAGNOSIS — Z955 Presence of coronary angioplasty implant and graft: Secondary | ICD-10-CM

## 2020-04-08 DIAGNOSIS — Z9049 Acquired absence of other specified parts of digestive tract: Secondary | ICD-10-CM

## 2020-04-08 DIAGNOSIS — E66813 Obesity, class 3: Secondary | ICD-10-CM

## 2020-04-08 DIAGNOSIS — Z8249 Family history of ischemic heart disease and other diseases of the circulatory system: Secondary | ICD-10-CM | POA: Diagnosis not present

## 2020-04-08 DIAGNOSIS — I4821 Permanent atrial fibrillation: Secondary | ICD-10-CM | POA: Diagnosis present

## 2020-04-08 DIAGNOSIS — I5043 Acute on chronic combined systolic (congestive) and diastolic (congestive) heart failure: Secondary | ICD-10-CM | POA: Diagnosis not present

## 2020-04-08 DIAGNOSIS — I340 Nonrheumatic mitral (valve) insufficiency: Secondary | ICD-10-CM | POA: Diagnosis not present

## 2020-04-08 DIAGNOSIS — R262 Difficulty in walking, not elsewhere classified: Secondary | ICD-10-CM | POA: Diagnosis not present

## 2020-04-08 DIAGNOSIS — R0902 Hypoxemia: Secondary | ICD-10-CM | POA: Diagnosis not present

## 2020-04-08 DIAGNOSIS — M1A9XX0 Chronic gout, unspecified, without tophus (tophi): Secondary | ICD-10-CM | POA: Diagnosis not present

## 2020-04-08 DIAGNOSIS — R0602 Shortness of breath: Secondary | ICD-10-CM | POA: Diagnosis not present

## 2020-04-08 DIAGNOSIS — M6281 Muscle weakness (generalized): Secondary | ICD-10-CM | POA: Diagnosis not present

## 2020-04-08 DIAGNOSIS — Z741 Need for assistance with personal care: Secondary | ICD-10-CM | POA: Diagnosis not present

## 2020-04-08 DIAGNOSIS — I361 Nonrheumatic tricuspid (valve) insufficiency: Secondary | ICD-10-CM | POA: Diagnosis not present

## 2020-04-08 DIAGNOSIS — I2583 Coronary atherosclerosis due to lipid rich plaque: Secondary | ICD-10-CM | POA: Diagnosis present

## 2020-04-08 DIAGNOSIS — J9811 Atelectasis: Secondary | ICD-10-CM | POA: Diagnosis not present

## 2020-04-08 DIAGNOSIS — E1129 Type 2 diabetes mellitus with other diabetic kidney complication: Secondary | ICD-10-CM | POA: Diagnosis not present

## 2020-04-08 DIAGNOSIS — R0689 Other abnormalities of breathing: Secondary | ICD-10-CM | POA: Diagnosis not present

## 2020-04-08 DIAGNOSIS — E119 Type 2 diabetes mellitus without complications: Secondary | ICD-10-CM

## 2020-04-08 DIAGNOSIS — I358 Other nonrheumatic aortic valve disorders: Secondary | ICD-10-CM | POA: Diagnosis present

## 2020-04-08 DIAGNOSIS — R609 Edema, unspecified: Secondary | ICD-10-CM | POA: Diagnosis not present

## 2020-04-08 DIAGNOSIS — Z9981 Dependence on supplemental oxygen: Secondary | ICD-10-CM | POA: Diagnosis not present

## 2020-04-08 DIAGNOSIS — N184 Chronic kidney disease, stage 4 (severe): Secondary | ICD-10-CM | POA: Diagnosis not present

## 2020-04-08 LAB — CBC
HCT: 28.1 % — ABNORMAL LOW (ref 36.0–46.0)
Hemoglobin: 8 g/dL — ABNORMAL LOW (ref 12.0–15.0)
MCH: 27 pg (ref 26.0–34.0)
MCHC: 28.5 g/dL — ABNORMAL LOW (ref 30.0–36.0)
MCV: 94.9 fL (ref 80.0–100.0)
Platelets: 361 10*3/uL (ref 150–400)
RBC: 2.96 MIL/uL — ABNORMAL LOW (ref 3.87–5.11)
RDW: 20.8 % — ABNORMAL HIGH (ref 11.5–15.5)
WBC: 10.2 10*3/uL (ref 4.0–10.5)
nRBC: 0 % (ref 0.0–0.2)

## 2020-04-08 LAB — COMPREHENSIVE METABOLIC PANEL
ALT: 37 U/L (ref 0–44)
AST: 36 U/L (ref 15–41)
Albumin: 2 g/dL — ABNORMAL LOW (ref 3.5–5.0)
Alkaline Phosphatase: 153 U/L — ABNORMAL HIGH (ref 38–126)
Anion gap: 16 — ABNORMAL HIGH (ref 5–15)
BUN: 60 mg/dL — ABNORMAL HIGH (ref 8–23)
CO2: 24 mmol/L (ref 22–32)
Calcium: 8.6 mg/dL — ABNORMAL LOW (ref 8.9–10.3)
Chloride: 100 mmol/L (ref 98–111)
Creatinine, Ser: 2.96 mg/dL — ABNORMAL HIGH (ref 0.44–1.00)
GFR calc Af Amer: 16 mL/min — ABNORMAL LOW (ref >60)
GFR calc non Af Amer: 14 mL/min — ABNORMAL LOW (ref >60)
Glucose, Bld: 185 mg/dL — ABNORMAL HIGH (ref 70–99)
Potassium: 2.9 mmol/L — ABNORMAL LOW (ref 3.5–5.1)
Sodium: 140 mmol/L (ref 135–145)
Total Bilirubin: 0.4 mg/dL (ref 0.3–1.2)
Total Protein: 6.4 g/dL — ABNORMAL LOW (ref 6.5–8.1)

## 2020-04-08 LAB — SARS CORONAVIRUS 2 BY RT PCR (HOSPITAL ORDER, PERFORMED IN ~~LOC~~ HOSPITAL LAB): SARS Coronavirus 2: NEGATIVE

## 2020-04-08 LAB — BRAIN NATRIURETIC PEPTIDE: B Natriuretic Peptide: 468 pg/mL — ABNORMAL HIGH (ref 0.0–100.0)

## 2020-04-08 LAB — GLUCOSE, CAPILLARY: Glucose-Capillary: 153 mg/dL — ABNORMAL HIGH (ref 70–99)

## 2020-04-08 LAB — MAGNESIUM: Magnesium: 1.6 mg/dL — ABNORMAL LOW (ref 1.7–2.4)

## 2020-04-08 MED ORDER — ONDANSETRON HCL 4 MG PO TABS: 4.0000 mg | ORAL_TABLET | Freq: Four times a day (QID) | ORAL | Status: DC | PRN

## 2020-04-08 MED ORDER — FUROSEMIDE 10 MG/ML IJ SOLN
100.0000 mg | Freq: Once | INTRAVENOUS | Status: AC
  Administered 2020-04-08: 100 mg via INTRAVENOUS
  Filled 2020-04-08: qty 10

## 2020-04-08 MED ORDER — APIXABAN 2.5 MG PO TABS
2.5000 mg | ORAL_TABLET | Freq: Two times a day (BID) | ORAL | Status: DC
  Administered 2020-04-08 – 2020-04-14 (×12): 2.5 mg via ORAL
  Filled 2020-04-08 (×12): qty 1

## 2020-04-08 MED ORDER — ACETAMINOPHEN 325 MG PO TABS: 650.0000 mg | ORAL_TABLET | Freq: Four times a day (QID) | ORAL | Status: DC | PRN

## 2020-04-08 MED ORDER — INSULIN ASPART 100 UNIT/ML ~~LOC~~ SOLN
0.0000 [IU] | Freq: Three times a day (TID) | SUBCUTANEOUS | Status: DC
  Administered 2020-04-09 – 2020-04-10 (×4): 1 [IU] via SUBCUTANEOUS
  Administered 2020-04-10 – 2020-04-11 (×3): 2 [IU] via SUBCUTANEOUS
  Administered 2020-04-11 – 2020-04-12 (×2): 1 [IU] via SUBCUTANEOUS
  Administered 2020-04-12 – 2020-04-13 (×3): 3 [IU] via SUBCUTANEOUS
  Administered 2020-04-13: 2 [IU] via SUBCUTANEOUS
  Administered 2020-04-13: 1 [IU] via SUBCUTANEOUS
  Administered 2020-04-14 (×3): 2 [IU] via SUBCUTANEOUS

## 2020-04-08 MED ORDER — VITAMIN D 25 MCG (1000 UNIT) PO TABS
2000.0000 [IU] | ORAL_TABLET | Freq: Every day | ORAL | Status: DC
  Administered 2020-04-09 – 2020-04-14 (×6): 2000 [IU] via ORAL
  Filled 2020-04-08 (×6): qty 2

## 2020-04-08 MED ORDER — TRAMADOL HCL 50 MG PO TABS
50.0000 mg | ORAL_TABLET | Freq: Four times a day (QID) | ORAL | Status: DC | PRN
  Administered 2020-04-09 – 2020-04-14 (×5): 50 mg via ORAL
  Filled 2020-04-08 (×5): qty 1

## 2020-04-08 MED ORDER — POTASSIUM CHLORIDE CRYS ER 20 MEQ PO TBCR
40.0000 meq | EXTENDED_RELEASE_TABLET | Freq: Once | ORAL | Status: AC
  Administered 2020-04-08: 40 meq via ORAL
  Filled 2020-04-08: qty 2

## 2020-04-08 MED ORDER — FUROSEMIDE 10 MG/ML IJ SOLN
60.0000 mg | Freq: Three times a day (TID) | INTRAMUSCULAR | Status: DC
  Administered 2020-04-08 – 2020-04-12 (×11): 60 mg via INTRAVENOUS
  Filled 2020-04-08 (×11): qty 6

## 2020-04-08 MED ORDER — ALLOPURINOL 300 MG PO TABS
300.0000 mg | ORAL_TABLET | Freq: Every day | ORAL | Status: DC
  Administered 2020-04-09 – 2020-04-14 (×6): 300 mg via ORAL
  Filled 2020-04-08 (×6): qty 1

## 2020-04-08 MED ORDER — POTASSIUM CHLORIDE CRYS ER 20 MEQ PO TBCR
20.0000 meq | EXTENDED_RELEASE_TABLET | Freq: Once | ORAL | Status: AC
  Administered 2020-04-08: 20 meq via ORAL
  Filled 2020-04-08: qty 1

## 2020-04-08 MED ORDER — MAGNESIUM SULFATE 2 GM/50ML IV SOLN
2.0000 g | Freq: Once | INTRAVENOUS | Status: AC
  Administered 2020-04-08: 2 g via INTRAVENOUS
  Filled 2020-04-08: qty 50

## 2020-04-08 MED ORDER — ROSUVASTATIN CALCIUM 20 MG PO TABS
20.0000 mg | ORAL_TABLET | Freq: Every day | ORAL | Status: DC
  Administered 2020-04-09 – 2020-04-14 (×6): 20 mg via ORAL
  Filled 2020-04-08 (×6): qty 1

## 2020-04-08 MED ORDER — METOLAZONE 5 MG PO TABS
2.5000 mg | ORAL_TABLET | Freq: Every day | ORAL | Status: DC
  Administered 2020-04-08 – 2020-04-12 (×5): 2.5 mg via ORAL
  Filled 2020-04-08 (×5): qty 1

## 2020-04-08 MED ORDER — ASPIRIN EC 81 MG PO TBEC
81.0000 mg | DELAYED_RELEASE_TABLET | Freq: Every day | ORAL | Status: DC
  Administered 2020-04-09 – 2020-04-14 (×6): 81 mg via ORAL
  Filled 2020-04-08 (×6): qty 1

## 2020-04-08 MED ORDER — ACETAMINOPHEN 650 MG RE SUPP: 650.0000 mg | Freq: Four times a day (QID) | RECTAL | Status: DC | PRN

## 2020-04-08 MED ORDER — ONDANSETRON HCL 4 MG/2ML IJ SOLN: 4.0000 mg | Freq: Four times a day (QID) | INTRAMUSCULAR | Status: DC | PRN

## 2020-04-08 MED ORDER — METOPROLOL SUCCINATE ER 50 MG PO TB24
50.0000 mg | ORAL_TABLET | Freq: Every day | ORAL | Status: DC
  Administered 2020-04-08 – 2020-04-13 (×6): 50 mg via ORAL
  Filled 2020-04-08 (×6): qty 1

## 2020-04-08 NOTE — ED Triage Notes (Signed)
Pt resident of [facility] in [place].  C/O fluid retention in legs and sob.  O2 sat on room air was 90%.  Denies any cough or fever.

## 2020-04-08 NOTE — H&P (Addendum)
History and Physical    Kieley Akter [id number]:[id number] DOB: 08-22-1935 DOA: 04/08/2020  PCP: Rosalee Kaufman, PA-C   Patient coming from: home   Chief Complaint: dyspnea.   HPI: Kristina Howell is a 84 y.o. female with medical history significant of atrial fibrillation, coronary artery disease, diastolic heart failure, stage IV chronic kidney disease, hypertension, obesity and type 2 diabetes mellitus. Patient reports worsening lower extremity edema for the last 4 months, over the last 10 days she has developed progressive and worsening dyspnea, to the point where she is symptomatic with minimal efforts like talking or going to the bathroom.  She has been sleeping in a recliner for long time, and she acknowledged orthopnea.    Dyspnea described as moderate to severe in intensity, worse with exertion, no frank improving factors, no associated chest pain or palpitations.  No wheezing or cough.  Positive weight gain 10 pounds over the last 7 days.  She has been seen as an outpatient by her cardiologist and her symptoms have been refractive to up titration of diuretic therapy with torsemide.   ED Course: Patient was found significantly hypervolemic, she has received 100 mg of intravenous furosemide and 40 mEq of potassium chloride along with 2 g of magnesium sulfate.  Referred for admission for evaluation.  Review of Systems:  1. General: No fevers, no chills, positive 10 lbs weight gain over last 7 days.  2. ENT: No runny nose or sore throat, no hearing disturbances 3. Pulmonary: positive dyspnea as mentioned in HPI.  4. Cardiovascular: No angina, claudication, positive lower extremity edema, pnd and orthopnea, as mentioned in HPI.  5. Gastrointestinal: No nausea or vomiting, no diarrhea or constipation 6. Hematology: No easy bruisability or frequent infections 7. Urology: No dysuria, hematuria or increased urinary frequency 8. Dermatology: No rashes. 9. Neurology: No seizures or  paresthesias 10. Musculoskeletal: No joint pain or deformities  Past Medical History:  Diagnosis Date  . Atrial fibrillation ([place])   . CAD (coronary artery disease)    Multivessel, DES to LAD and RCA 08/2016  . Cardiomyopathy (HCC)    LVEF 45%  . CKD (chronic kidney disease) stage 5, GFR less than 15 ml/min (HCC)   . Essential hypertension   . Gout   . Pancreatitis, recurrent   . Type 2 diabetes mellitus ([place])      Past Surgical History:  Procedure Laterality Date  . CHOLECYSTECTOMY    . CORONARY STENT INTERVENTION N/A 09/08/2016   Procedure: Coronary Stent Intervention;  Surgeon: Belva Crome, MD;  Location: [facility];  Service: Cardiovascular;  Laterality: N/A;  . LEFT HEART CATH AND CORONARY ANGIOGRAPHY N/A 09/03/2016   Procedure: Left Heart Cath and Coronary Angiography;  Surgeon: Nelva Bush, MD;  Location: [facility];  Service: Cardiovascular;  Laterality: N/A;  . TONSILLECTOMY       reports that she has never smoked. She has never used smokeless tobacco. She reports that she does not drink alcohol and does not use drugs.  No Known Allergies  Family History  Problem Relation Age of Onset  . Heart disease Father      Prior to Admission medications   Medication Sig Start Date End Date Taking? Authorizing Provider  allopurinol (ZYLOPRIM) 300 MG tablet Take 300 mg by mouth daily.    [provider]  amLODipine (NORVASC) 5 MG tablet Take 1 tablet (5 mg total) by mouth daily. 08/07/19 02/03/21  Satira Sark, MD  aspirin EC 81 MG tablet  Take 81 mg by mouth daily.    [provider]  benazepril (LOTENSIN) 40 MG tablet Take 40 mg by mouth daily. 08/26/16   [provider]  Cholecalciferol (VITAMIN D) 50 MCG (2000 UT) CAPS Take 1 capsule by mouth daily.    [provider]  ELIQUIS 2.5 MG TABS tablet TAKE 1 TABLET(2.5 MG) BY MOUTH TWICE DAILY 12/03/19   Satira Sark, MD  Insulin Pen Needle (PEN NEEDLES) 31G X 8 MM  MISC 1 each by Does not apply route 4 (four) times daily. 10/14/17   Cassandria Anger, MD  metoprolol succinate (TOPROL-XL) 50 MG 24 hr tablet Take 1 tablet (50 mg total) by mouth at bedtime. 03/12/20   Satira Sark, MD  NOVOLOG FLEXPEN 100 UNIT/ML FlexPen ADMINISTER 10 TO 16 UNITS UNDER THE SKIN THREE TIMES DAILY WITH MEALS 07/31/18   Nida, Marella Chimes, MD  potassium chloride (KLOR-CON) 10 MEQ tablet Take 10 mEq by mouth daily. 11/22/19   [provider]  rosuvastatin (CRESTOR) 20 MG tablet Take 1 tablet (20 mg total) by mouth daily. 04/16/19 02/03/21  Satira Sark, MD  sodium bicarbonate 650 MG tablet Take 650 mg by mouth 3 (three) times daily.    [provider]  torsemide (DEMADEX) 20 MG tablet Take 5 tablets (100 mg total) by mouth in the morning. & 3 tablets (60 mg total) by mouth in the afternoon Patient taking differently: Take 100 mg by mouth in the morning.  12/27/19   Satira Sark, MD  TOUJEO SOLOSTAR 300 UNIT/ML SOPN INJECT 60 UNITS INTO THE SKIN AT BEDTIME 10/23/18   Cassandria Anger, MD  traMADol (ULTRAM) 50 MG tablet Take by mouth every 6 (six) hours as needed.    [provider]    Physical Exam: Vitals:   04/08/20 1548 04/08/20 1550  BP:  (!) 151/51  Pulse:  88  Resp:  (!) 26  Temp:  97.6 F (36.4 C)  TempSrc:  Oral  SpO2:  93%  Weight: 128.4 kg   Height: 5\' 5"  (1.651 m)     Vitals:   04/08/20 1548 04/08/20 1550  BP:  (!) 151/51  Pulse:  88  Resp:  (!) 26  Temp:  97.6 F (36.4 C)  TempSrc:  Oral  SpO2:  93%  Weight: 128.4 kg   Height: 5\' 5"  (1.651 m)    General: deconditioned and ill looking appearing, positive dyspnea at rest and speaking in short sentences.   Neurology: Awake and alert, non focal Head and Neck. Head normocephalic. Neck supple with no adenopathy or thyromegaly. Positive use of neck accessory muscles.    E ENT: mild pallor, no icterus, oral mucosa moist Cardiovascular: Moderate JVD. S1-S2  present, irregularly irregular, no gallops, rubs, or murmurs. Positive lower extremity edema +++ pitting, up to the hips bilaterally . Pulmonary: decreased breath sounds bilaterally, poor inspiratory effort with decreased breath sounds at bases. Gastrointestinal. Abdomen protuberant, soft and non tender Skin. No rashes Musculoskeletal: no joint deformities    Labs on Admission: I have personally reviewed following labs and imaging studies  CBC: Recent Labs  Lab 04/08/20 1631  WBC 10.2  HGB 8.0*  HCT 28.1*  MCV 94.9  PLT 710   Basic Metabolic Panel: Recent Labs  Lab 04/08/20 1631  NA 140  K 2.9*  CL 100  CO2 24  GLUCOSE 185*  BUN 60*  CREATININE 2.96*  CALCIUM 8.6*  MG 1.6*   GFR: Estimated Creatinine Clearance: 19.8  mL/min (A) (by C-G formula based on SCr of 2.96 mg/dL (H)). Liver Function Tests: Recent Labs  Lab 04/08/20 1631  AST 36  ALT 37  ALKPHOS 153*  BILITOT 0.4  PROT 6.4*  ALBUMIN 2.0*   No results for input(s): LIPASE, AMYLASE in the last 168 hours. No results for input(s): AMMONIA in the last 168 hours. Coagulation Profile: No results for input(s): INR, PROTIME in the last 168 hours. Cardiac Enzymes: No results for input(s): CKTOTAL, CKMB, CKMBINDEX, TROPONINI in the last 168 hours. BNP (last 3 results) No results for input(s): PROBNP in the last 8760 hours. HbA1C: No results for input(s): HGBA1C in the last 72 hours. CBG: No results for input(s): GLUCAP in the last 168 hours. Lipid Profile: No results for input(s): CHOL, HDL, LDLCALC, TRIG, CHOLHDL, LDLDIRECT in the last 72 hours. Thyroid Function Tests: No results for input(s): TSH, T4TOTAL, FREET4, T3FREE, THYROIDAB in the last 72 hours. Anemia Panel: No results for input(s): VITAMINB12, FOLATE, FERRITIN, TIBC, IRON, RETICCTPCT in the last 72 hours. Urine analysis: No results found for: COLORURINE, APPEARANCEUR, [place], [place], GLUCOSEU, HGBUR, BILIRUBINUR, KETONESUR, PROTEINUR,  UROBILINOGEN, NITRITE, LEUKOCYTESUR  Radiological Exams on Admission: DG Chest Portable 1 View  Result Date: 04/08/2020 CLINICAL DATA:  Shortness of breath EXAM: PORTABLE CHEST 1 VIEW COMPARISON:  August 29, 2016 FINDINGS: The cardiomediastinal silhouette is unchanged and enlarged in contour.Atherosclerotic calcifications of the aorta. Small bilateral pleural effusions. No pneumothorax. Diffuse interstitial prominence. Bibasilar homogeneous opacities likely atelectasis. Visualized abdomen is unremarkable. Multilevel degenerative changes of the thoracic spine. IMPRESSION: 1. Small bilateral pleural effusions with bibasilar atelectasis. 2. Diffuse interstitial prominence likely reflects pulmonary edema. Electronically Signed   By: Valentino Saxon MD   On: 04/08/2020 17:05    EKG: Independently reviewed.  85 bpm, normal axis, normal QTc, atrial fibrillation rhythm, Q-wave lead I-aVL, V1-V2, poor R wave progression, no ST segment or T wave changes.  Assessment/Plan Principal Problem:   Heart failure (HCC) Active Problems:   Persistent atrial fibrillation (HCC)   Coronary artery disease due to lipid rich plaque   Diabetes mellitus (HCC)   Mixed hyperlipidemia   Essential hypertension, benign   Obesity, Class III, BMI 40-49.9 (morbid obesity) ([place])   CKD stage 5 due to type 2 diabetes mellitus (HCC)   Acquired lymphedema of lower extremity    84 year old female with significant past medical history for diastolic heart failure and chronic extremity lymphedema who presents with worsening heart failure symptoms including lower extremity edema, PND, orthopnea and progressive dyspnea.  10 pound increase over the last 7 days.  Symptoms refractory to outpatient therapy with oral diuretics.  On her initial physical examination her blood pressure is 151/51, heart rate 88, respiratory rate 26, oxygen saturation 93% on room air, temperature 97.6.  She does have moderate JVD, decreased breath sounds at  bases, heart S1-S2, irregularly irregular, protuberant abdomen and 3+ pitting bilateral lower extremity edema up to the hips. Sodium 140, potassium 2.9, chloride 100, bicarb 24, glucose 186, BUN 60, creatinine 2.96, magnesium 1.6, anion gap 16, BNP 468, white count 10.2, hemoglobin 8.0, hematocrit 28.1, platelets 361.  SARS COVID-19 negative.  Chest radiograph with cardiomegaly, bilateral hilar vascular congestion, bilateral interstitial infiltrates, bilateral moderate pleural effusions more right than left.  Patient will be admitted to the telemetry ward working diagnosis of acute decompensation of diastolic heart failure.  1. Acute on chronic diastolic heart failure exacerbation/ acute cardiogenic pulmonary edema. Patient will be admitted to the telemetry ward, continue with aggressive diuresis  with furosemide 60 mg IV q8H, and metolazone 5 mg daily to target a negative fluid balance.   Follow strict in and out's, daily weight and renal function. Continue as needed supplemental 02 per  to keep 02 saturation more than 92%.   Hold on amlodipine and benazepril for now to prevent hypotension.   Follow up with echocardiogram, last study in the system is from 2018 with LV EF of 45% and severe hypokinesis of the mid anteroseptal wall, apical septal wall, apex and apical anterior wall.   2. Chronic and persistent atrial fibrillation. Continue rate control with metoprolol 50 mg (succinate), and anticoagulation with apixaban. Continue close telemetry monitoring.  3. Hypertension. Blood pressure stable, will place patient on aggressive diuresis, hold on amlodipine and benazepril for now.   4. T2DM/ controlled. dyslipidemia Continue glucose cover and monitoring with insulin sliding scale, hold on basal insulin for now. Continue with statin therapy.   5. AKI on CKD stage 4-5 with hypokalemia/ hypomagnesemia (base GFR is 19/ cr 2,5). Patient has received 60 meq Kcl in the ED, will add 40 more PO and will  follow up on renal function in am.  Had 2 g Mag sulfate in the ED, follow Mg in am.  Continue aggressive diuresis, avoid hypotension and nephrotoxic medications. Will discontinue oral sodium bicarbonate for now.   Target Mg of 2 and K of 4.   6. Obesity class 3. Follow up as outpatient.   Status is: Inpatient  Remains inpatient appropriate because:IV treatments appropriate due to intensity of illness or inability to take PO   Dispo: The patient is from: Home              Anticipated d/c is to: Home              Anticipated d/c date is: 3 days              Patient currently is not medically stable to d/c.   DVT prophylaxis: apixaban  Code Status:   full  Family Communication:  No family at the bedside     Consults called:  None   Admission status:  Inpatient    Gennell How Gerome Apley MD [facility]   04/08/2020, 5:38 PM

## 2020-04-08 NOTE — ED Provider Notes (Signed)
Kristina Howell   CSN: [id number] Arrival date & time: 04/08/20  1537     History Chief Complaint  Patient presents with  . Shortness of Breath    Kristina Howell is a 84 y.o. female.  She has a history of lymphedema CKD cardiomyopathy coronary disease A. fib on anticoagulation.  She has had increased peripheral edema of her legs now up to her thighs.  Increased shortness of breath dyspnea on exertion.  She said her sats have remained okay but she is out of breath with any type of activity.  She has been in contact with both her nephrologist and her cardiologist and they have changed her to [facility] and been increasing her dosage.  She has felt no relief.  Denies any chest pain fevers chills nausea vomiting diarrhea or urinary symptoms.  No recent falls.  Has had her Covid vaccination.  The history is provided by the patient.  Shortness of Breath Severity:  Severe Onset quality:  Gradual Timing:  Intermittent Progression:  Worsening Chronicity:  New Context: activity   Relieved by:  Nothing Worsened by:  Activity Ineffective treatments:  Diuretics Associated symptoms: no abdominal pain, no chest pain, no cough, no fever, no headaches, no hemoptysis, no rash, no sore throat, no syncope and no vomiting   Risk factors: obesity        Past Medical History:  Diagnosis Date  . Atrial fibrillation ([place])   . CAD (coronary artery disease)    Multivessel, DES to LAD and RCA 08/2016  . Cardiomyopathy (HCC)    LVEF 45%  . CKD (chronic kidney disease) stage 5, GFR less than 15 ml/min (HCC)   . Essential hypertension   . Gout   . Pancreatitis, recurrent   . Type 2 diabetes mellitus [facility])     Patient Active Problem List   Diagnosis Date Noted  . Type 2 diabetes mellitus with stage 4 chronic kidney disease, with long-term current use of insulin ([place]) 08/30/2017  . Diabetes mellitus ([place]) 08/30/2017  . Mixed hyperlipidemia 08/30/2017  . Essential  hypertension, benign 08/30/2017  . Femoral artery hematoma complicating cardiac catheterization 09/15/2016  . Status post coronary artery stent placement   . Difficulty in walking, not elsewhere classified   . Coronary artery disease involving native coronary artery of native heart with unstable angina pectoris ([place])   . Coronary artery disease due to lipid rich plaque   . Chronic anticoagulation   . Acute systolic heart failure ([place])   . Unstable angina ([place])   . Type 2 diabetes mellitus with diabetic foot ulcer ([place]) 08/30/2016  . Acute kidney injury ([place]) 08/30/2016  . Dyspnea 08/30/2016  . Peripheral edema   . Persistent atrial fibrillation ([place])   . Morbidly obese [facility])     Past Surgical History:  Procedure Laterality Date  . CHOLECYSTECTOMY    . CORONARY STENT INTERVENTION N/A 09/08/2016   Procedure: Coronary Stent Intervention;  Surgeon: Belva Crome, MD;  Location: [place] [facility];  Service: Cardiovascular;  Laterality: N/A;  . LEFT HEART CATH AND CORONARY ANGIOGRAPHY N/A 09/03/2016   Procedure: Left Heart Cath and Coronary Angiography;  Surgeon: Nelva Bush, MD;  Location: [place] [facility];  Service: Cardiovascular;  Laterality: N/A;  . TONSILLECTOMY       OB History   No obstetric history on file.     Family History  Problem Relation Age of Onset  . Heart disease Father     Social History  Tobacco Use  . Smoking status: Never Smoker  . Smokeless tobacco: Never Used  Vaping Use  . Vaping Use: Never used  Substance Use Topics  . Alcohol use: No  . Drug use: No    Home Medications Prior to Admission medications   Medication Sig Start Date End Date Taking? Authorizing Provider  allopurinol (ZYLOPRIM) 300 MG tablet Take 300 mg by mouth daily.    [provider]  amLODipine (NORVASC) 5 MG tablet Take 1 tablet (5 mg total) by mouth daily. 08/07/19 02/03/21  Satira Sark, MD  aspirin EC 81 MG tablet Take 81 mg by mouth daily.    [provider]  benazepril (LOTENSIN) 40 MG tablet Take 40 mg by mouth daily. 08/26/16   [provider]  Cholecalciferol (VITAMIN D) 50 MCG (2000 UT) CAPS Take 1 capsule by mouth daily.    [provider]  ELIQUIS 2.5 MG TABS tablet TAKE 1 TABLET(2.5 MG) BY MOUTH TWICE DAILY 12/03/19   Satira Sark, MD  Insulin Pen Needle (PEN NEEDLES) 31G X 8 MM MISC 1 each by Does not apply route 4 (four) times daily. 10/14/17   Cassandria Anger, MD  metoprolol succinate (TOPROL-XL) 50 MG 24 hr tablet Take 1 tablet (50 mg total) by mouth at bedtime. 03/12/20   Satira Sark, MD  NOVOLOG FLEXPEN 100 UNIT/ML FlexPen ADMINISTER 10 TO 16 UNITS UNDER THE SKIN THREE TIMES DAILY WITH MEALS 07/31/18   Nida, Marella Chimes, MD  potassium chloride (KLOR-CON) 10 MEQ tablet Take 10 mEq by mouth daily. 11/22/19   [provider]  rosuvastatin (CRESTOR) 20 MG tablet Take 1 tablet (20 mg total) by mouth daily. 04/16/19 02/03/21  Satira Sark, MD  sodium bicarbonate 650 MG tablet Take 650 mg by mouth 3 (three) times daily.    [provider]  torsemide (DEMADEX) 20 MG tablet Take 5 tablets (100 mg total) by mouth in the morning. & 3 tablets (60 mg total) by mouth in the afternoon Patient taking differently: Take 100 mg by mouth in the morning.  12/27/19   Satira Sark, MD  TOUJEO SOLOSTAR 300 UNIT/ML SOPN INJECT 60 UNITS INTO THE SKIN AT BEDTIME 10/23/18   Cassandria Anger, MD  traMADol (ULTRAM) 50 MG tablet Take by mouth every 6 (six) hours as needed.    [provider]    Allergies    Patient has no known allergies.  Review of Systems   Review of Systems  Constitutional: Negative for fever.  HENT: Negative for sore throat.   Eyes: Negative for visual disturbance.  Respiratory: Positive for shortness of breath. Negative for cough and hemoptysis.   Cardiovascular: Positive for leg swelling. Negative for chest pain and syncope.  Gastrointestinal: Negative  for abdominal pain and vomiting.  Genitourinary: Negative for dysuria.  Musculoskeletal: Positive for gait problem.  Skin: Negative for rash.  Neurological: Negative for headaches.    Physical Exam Updated Vital Signs BP (!) 151/51 (BP Location: Right Arm)   Pulse 88   Temp 97.6 F (36.4 C) (Oral)   Resp (!) 26   Ht 5\' 5"  (1.651 m)   Wt 128.4 kg   SpO2 93%   BMI 47.09 kg/m   Physical Exam Vitals and nursing Howell reviewed.  Constitutional:      General: She is in acute distress.     Appearance: She is well-developed. She is obese.  HENT:     Head: Normocephalic and atraumatic.  Eyes:  Conjunctiva/sclera: Conjunctivae normal.  Cardiovascular:     Rate and Rhythm: Normal rate and regular rhythm.     Heart sounds: No murmur heard.   Pulmonary:     Effort: Tachypnea and accessory muscle usage present. No respiratory distress.     Breath sounds: Normal breath sounds.  Abdominal:     Palpations: Abdomen is soft.     Tenderness: There is no abdominal tenderness. There is no guarding or rebound.  Musculoskeletal:        General: Tenderness (bilat LE) present. No signs of injury.     Cervical back: Neck supple.     Right lower leg: Edema present.     Left lower leg: Edema present.  Skin:    General: Skin is warm and dry.     Capillary Refill: Capillary refill takes less than 2 seconds.  Neurological:     General: No focal deficit present.     Mental Status: She is alert.     ED Results / Procedures / Treatments   Labs (all labs ordered are listed, but only abnormal results are displayed) Labs Reviewed  MAGNESIUM - Abnormal; Notable for the following components:      Result Value   Magnesium 1.6 (*)    All other components within normal limits  BRAIN NATRIURETIC PEPTIDE - Abnormal; Notable for the following components:   B Natriuretic Peptide 468.0 (*)    All other components within normal limits  CBC - Abnormal; Notable for the following components:   RBC  2.96 (*)    Hemoglobin 8.0 (*)    HCT 28.1 (*)    MCHC 28.5 (*)    RDW 20.8 (*)    All other components within normal limits  COMPREHENSIVE METABOLIC PANEL - Abnormal; Notable for the following components:   Potassium 2.9 (*)    Glucose, Bld 185 (*)    BUN 60 (*)    Creatinine, Ser 2.96 (*)    Calcium 8.6 (*)    Total Protein 6.4 (*)    Albumin 2.0 (*)    Alkaline Phosphatase 153 (*)    GFR calc non Af Amer 14 (*)    GFR calc Af Amer 16 (*)    Anion gap 16 (*)    All other components within normal limits  BASIC METABOLIC PANEL - Abnormal; Notable for the following components:   Glucose, Bld 175 (*)    BUN 60 (*)    Creatinine, Ser 2.83 (*)    GFR calc non Af Amer 15 (*)    GFR calc Af Amer 17 (*)    All other components within normal limits  CBC - Abnormal; Notable for the following components:   WBC 12.4 (*)    RBC 3.02 (*)    Hemoglobin 8.1 (*)    HCT 28.7 (*)    MCHC 28.2 (*)    RDW 20.7 (*)    All other components within normal limits  HEMOGLOBIN A1C - Abnormal; Notable for the following components:   Hgb A1c MFr Bld 7.5 (*)    All other components within normal limits  GLUCOSE, CAPILLARY - Abnormal; Notable for the following components:   Glucose-Capillary 153 (*)    All other components within normal limits  GLUCOSE, CAPILLARY - Abnormal; Notable for the following components:   Glucose-Capillary 153 (*)    All other components within normal limits  GLUCOSE, CAPILLARY - Abnormal; Notable for the following components:   Glucose-Capillary 188 (*)    All other  components within normal limits  SARS CORONAVIRUS 2 BY RT PCR [facility] ORDER, [place] [facility])  MAGNESIUM    EKG EKG Interpretation  Date/Time:  Tuesday April 08 2020 15:59:01 EDT Ventricular Rate:  85 PR Interval:    QRS Duration: 80 QT Interval:  304 QTC Calculation: 362 R Axis:   112 Text Interpretation: Atrial fibrillation Probable lateral infarct, age indeterminate  Probable anteroseptal infarct, old No significant change since prior 2/18 Confirmed by Aletta Edouard [phone]) on 04/08/2020 4:29:28 PM   Radiology DG Chest Portable 1 View  Result Date: 04/08/2020 CLINICAL DATA:  Shortness of breath EXAM: PORTABLE CHEST 1 VIEW COMPARISON:  August 29, 2016 FINDINGS: The cardiomediastinal silhouette is unchanged and enlarged in contour.Atherosclerotic calcifications of the aorta. Small bilateral pleural effusions. No pneumothorax. Diffuse interstitial prominence. Bibasilar homogeneous opacities likely atelectasis. Visualized abdomen is unremarkable. Multilevel degenerative changes of the thoracic spine. IMPRESSION: 1. Small bilateral pleural effusions with bibasilar atelectasis. 2. Diffuse interstitial prominence likely reflects pulmonary edema. Electronically Signed   By: Valentino Saxon MD   On: 04/08/2020 17:05   ECHOCARDIOGRAM COMPLETE  Result Date: 04/09/2020    ECHOCARDIOGRAM REPORT   Patient Name:   Kristina Howell Date of Exam: 04/09/2020 Medical Rec #:  [id number]       Height:       65.0 in Accession #:    [id number]      Weight:       283.3 lb Date of Birth:  04/15/1936        BSA:          2.294 m Patient Age:    39 years        BP:           149/67 mmHg Patient Gender: F               HR:           87 bpm. Exam Location:  [facility] Procedure: 2D Echo, Cardiac Doppler and Color Doppler Indications:    Dyspnea 786.09 / R06.00  History:        Patient has prior history of Echocardiogram examinations, most                 recent 08/30/2016. Arrythmias:Atrial Fibrillation; Risk                 Factors:Hypertension and Diabetes.  Sonographer:    Leavy Cella RDCS (AE) Referring Phys: [id number] [place]  1. Left ventricular ejection fraction, by estimation, is 60 to 65%. The left ventricle has normal function. The left ventricle has no regional wall motion abnormalities. There is mild left ventricular hypertrophy. Left ventricular diastolic  parameters are indeterminate in the setting of atrial fibrillation.  2. Right ventricular systolic function is mildly to moderately reduced. The right ventricular size is normal. Tricuspid regurgitation signal is inadequate for assessing PA pressure.  3. Left atrial size was mildly dilated.  4. The mitral valve is abnormal. Mild mitral valve regurgitation. Moderate mitral annular calcification.  5. The aortic valve is tricuspid. There is moderate calcification of the aortic valve. Aortic valve regurgitation is not visualized. Aortic valve mean gradient measures 12.3 mmHg.  6. The inferior vena cava is dilated in size with >50% respiratory variability, suggesting right atrial pressure of 8 mmHg. FINDINGS  Left Ventricle: Left ventricular ejection fraction, by estimation, is 60 to 65%. The left ventricle has normal function. The left ventricle has no regional wall motion abnormalities.  The left ventricular internal cavity size was normal in size. There is  mild left ventricular hypertrophy. Left ventricular diastolic parameters are indeterminate. Right Ventricle: The right ventricular size is normal. No increase in right ventricular wall thickness. Right ventricular systolic function is moderately reduced. Tricuspid regurgitation signal is inadequate for assessing PA pressure. Left Atrium: Left atrial size was mildly dilated. Right Atrium: Right atrial size was normal in size. Pericardium: There is no evidence of pericardial effusion. Mitral Valve: The mitral valve is abnormal. Moderate mitral annular calcification. Mild mitral valve regurgitation. Tricuspid Valve: The tricuspid valve is grossly normal. Tricuspid valve regurgitation is mild. Aortic Valve: The aortic valve is tricuspid. There is moderate calcification of the aortic valve. There is mild to moderate aortic valve annular calcification. Aortic valve regurgitation is not visualized. Aortic valve mean gradient measures 12.3 mmHg. Aortic valve peak gradient  measures 19.7 mmHg. Aortic valve area, by VTI measures 0.75 cm. Pulmonic Valve: The pulmonic valve was grossly normal. Pulmonic valve regurgitation is trivial. Aorta: The aortic root is normal in size and structure. Venous: The inferior vena cava is dilated in size with greater than 50% respiratory variability, suggesting right atrial pressure of 8 mmHg. IAS/Shunts: No atrial level shunt detected by color flow Doppler. Additional Comments: There is pleural effusion in the left lateral region.  LEFT VENTRICLE PLAX 2D LVIDd:         4.16 cm  Diastology LVIDs:         2.60 cm  LV e' medial:    8.38 cm/s LV PW:         1.28 cm  LV E/e' medial:  16.0 LV IVS:        1.15 cm  LV e' lateral:   7.51 cm/s LVOT diam:     1.80 cm  LV E/e' lateral: 17.8 LV SV:         38 LV SV Index:   17 LVOT Area:     2.54 cm  RIGHT VENTRICLE RV S prime:     9.57 cm/s LEFT ATRIUM              Index       RIGHT ATRIUM           Index LA diam:        5.10 cm  2.22 cm/m  RA Area:     17.30 cm LA Vol (A2C):   111.0 ml 48.40 ml/m RA Volume:   49.70 ml  21.67 ml/m LA Vol (A4C):   55.0 ml  23.98 ml/m LA Biplane Vol: 79.0 ml  34.44 ml/m  AORTIC VALVE AV Area (Vmax):    0.77 cm AV Area (Vmean):   0.70 cm AV Area (VTI):     0.75 cm AV Vmax:           222.00 cm/s AV Vmean:          167.667 cm/s AV VTI:            0.507 m AV Peak Grad:      19.7 mmHg AV Mean Grad:      12.3 mmHg LVOT Vmax:         67.23 cm/s LVOT Vmean:        46.400 cm/s LVOT VTI:          0.149 m LVOT/AV VTI ratio: 0.29  AORTA Ao Root diam: 2.30 cm MITRAL VALVE MV Area (PHT): 3.75 cm     SHUNTS MV Decel Time: 202 msec  Systemic VTI:  0.15 m MR Peak grad: 109.4 mmHg    Systemic Diam: 1.80 cm MR Mean grad: 78.0 mmHg MR Vmax:      523.00 cm/s MR Vmean:     421.0 cm/s MV E velocity: 134.00 cm/s Rozann Lesches MD Electronically signed by Rozann Lesches MD Signature Date/Time: 04/09/2020/12:59:54 PM    Final     Procedures Procedures (including critical care  time)  Medications Ordered in ED Medications  furosemide (LASIX) 100 mg in dextrose 5 % 50 mL IVPB (has no administration in time range)  potassium chloride SA (KLOR-CON) CR tablet 20 mEq (has no administration in time range)    ED Course  I have reviewed the triage vital signs and the nursing notes.  Pertinent labs & imaging results that were available during my care of the patient were reviewed by me and considered in my medical decision making (see chart for details).  Clinical Course as of Apr 09 1450  Tue Apr 08, 2020  7564 Echo 2/18 - Study Conclusions   - Left ventricle: The cavity size was normal. Wall thickness was  increased in a pattern of mild LVH. Severe hypokinesis of the mid  anteroseptal wall, apical septal wall, true apex, mid to apical  anterior wall. Indeterminant diastolic function (atrial  fibrillation). The estimated ejection fraction was 45%.  - Aortic valve: There was no stenosis.  - Mitral valve: Mildly to moderately calcified annulus. There was  trivial regurgitation.  - Left atrium: The atrium was mildly to moderately dilated.  - Right ventricle: The cavity size was normal. Systolic function  was normal.  - Pulmonary arteries: No complete TR doppler jet so unable to  estimate PA systolic pressure.  - Systemic veins: IVC measured 2.3 cm with < 50% respirophasic  variation, suggesting RA pressure 15 mmHg.    [MB]  1701 Chest x-ray interpreted by me as bilateral pleural effusions likely increased interstitial markings.   [MB]  81 Discussed with [facility] hospitalist Dr. Cathlean Sauer who will evaluate the patient for admission.   [MB]    Clinical Course User Index [MB] Hayden Rasmussen, MD   MDM Rules/Calculators/A&P                         This patient complains of dyspnea on exertion, increased peripheral edema; this involves an extensive number of treatment Options and is a complaint that carries with it a high risk of complications  and Morbidity. The differential includes CHF, cardiomyopathy, worsening kidney disease, anemia, ischemia, metabolic derangement  I ordered, reviewed and interpreted labs, which included CBC with normal white count, hemoglobin low and will need to be trended, chemistries with chronic CKD and elevated BUN, elevated glucose, low potassium, low magnesium, elevated BNP, Covid testing negative I ordered medication IV Lasix, oral potassium and IV magnesium I ordered imaging studies which included chest x-ray and I independently    visualized and interpreted imaging which showed bilateral effusions Previous records obtained and reviewed in epic including recent nephrology and cardiology notes I consulted Dr. Cathlean Sauer [facility] hospitalist and discussed lab and imaging findings  Critical Interventions: None  After the interventions stated above, I reevaluated the patient and found patient still to be symptomatic. Not hypoxic but still working for her breathing and tachypneic. Will need to be admitted to the hospital for diuresis as she failed outpatient diuretics. Patient in agreement with plan.   Final Clinical Impression(s) / ED Diagnoses Final diagnoses:  Acute congestive  heart failure, unspecified heart failure type ([place])  Chronic kidney disease, unspecified CKD stage  Hypokalemia  Hypomagnesemia    Rx / DC Orders ED Discharge Orders    None       Hayden Rasmussen, MD 04/09/20 [phone]

## 2020-04-08 NOTE — ED Notes (Signed)
ED TO INPATIENT HANDOFF REPORT  ED Nurse Name and Phone #:  [phone]  S Name/Age/Gender Kristina Howell 84 y.o. female Room/Bed: APA15/APA15  Code Status   Code Status: Prior  Home/SNF/Other Home Patient oriented to: self, place, time and situation Is this baseline? Yes   Triage Complete: Triage complete  Chief Complaint Heart failure [facility]) [I50.9]  Triage Note Pt resident of [facility] in [place].  C/O fluid retention in legs and sob.  O2 sat on room air was 90%.  Denies any cough or fever.    Allergies No Known Allergies  Level of Care/Admitting Diagnosis ED Disposition    ED Disposition Condition [facility] Area: [facility] [[id number]]  Level of Care: Telemetry [5]  Covid Evaluation: Confirmed COVID Negative  Diagnosis: Heart failure [facility]) [[id number]]  Admitting Physician: Tawni Millers [[id number]]  Attending Physician: Tawni Millers [[id number]]  Estimated length of stay: 3 - 4 days  Certification:: I certify this patient will need inpatient services for at least 2 midnights       B Medical/Surgery History Past Medical History:  Diagnosis Date  . Atrial fibrillation ([place])   . CAD (coronary artery disease)    Multivessel, DES to LAD and RCA 08/2016  . Cardiomyopathy (HCC)    LVEF 45%  . CKD (chronic kidney disease) stage 5, GFR less than 15 ml/min (HCC)   . Essential hypertension   . Gout   . Pancreatitis, recurrent   . Type 2 diabetes mellitus ([place])    Past Surgical History:  Procedure Laterality Date  . CHOLECYSTECTOMY    . CORONARY STENT INTERVENTION N/A 09/08/2016   Procedure: Coronary Stent Intervention;  Surgeon: Belva Crome, MD;  Location: [place] [facility];  Service: Cardiovascular;  Laterality: N/A;  . LEFT HEART CATH AND CORONARY ANGIOGRAPHY N/A 09/03/2016   Procedure: Left Heart Cath and Coronary Angiography;  Surgeon: Nelva Bush, MD;  Location: [place] [facility];  Service: Cardiovascular;  Laterality:  N/A;  . TONSILLECTOMY       A IV Location/Drains/Wounds Patient Lines/Drains/Airways Status    Active Line/Drains/Airways    Name Placement date Placement time Site Days   Peripheral IV 04/08/20 Left Antecubital 04/08/20  1635  Antecubital  less than 1   Peripheral IV 04/08/20 Right Antecubital 04/08/20  1822  Antecubital  less than 1   Wound / Incision (Open or Dehisced) 08/30/16 Diabetic ulcer Foot Right Hard crusted over ulcer 08/30/16  0339  Foot  1317          Intake/Output Last 24 hours No intake or output data in the 24 hours ending 04/08/20 1832  Labs/Imaging Results for orders placed or performed during the hospital encounter of 04/08/20 (from the past 48 hour(s))  SARS Coronavirus 2 by RT PCR (hospital order, performed in [place] hospital lab) Nasopharyngeal Nasopharyngeal Swab     Status: None   Collection Time: 04/08/20  4:03 PM   Specimen: Nasopharyngeal Swab  Result Value Ref Range   SARS Coronavirus 2 NEGATIVE NEGATIVE    Comment: (NOTE) SARS-CoV-2 target nucleic acids are NOT DETECTED.  The SARS-CoV-2 RNA is generally detectable in upper and lower respiratory specimens during the acute phase of infection. The lowest concentration of SARS-CoV-2 viral copies this assay can detect is 250 copies / mL. A negative result does not preclude SARS-CoV-2 infection and should not be used as the sole basis for treatment or other patient management decisions.  A negative result may occur with  improper specimen collection / handling, submission of specimen other than nasopharyngeal swab, presence of viral mutation(s) within the areas targeted by this assay, and inadequate number of viral copies (<250 copies / mL). A negative result must be combined with clinical observations, patient history, and epidemiological information.  Fact Sheet for Patients:   StrictlyIdeas.no  Fact Sheet for Healthcare  Providers: BankingDealers.co.za  This test is not yet approved or  cleared by the [place] FDA and has been authorized for detection and/or diagnosis of SARS-CoV-2 by FDA under an Emergency Use Authorization (EUA).  This EUA will remain in effect (meaning this test can be used) for the duration of the COVID-19 declaration under Section 564(b)(1) of the Act, 21 U.S.C. section 360bbb-3(b)(1), unless the authorization is terminated or revoked sooner.  Performed at [facility], [address] [place], [place] [address]   Magnesium     Status: Abnormal   Collection Time: 04/08/20  4:31 PM  Result Value Ref Range   Magnesium 1.6 (L) 1.7 - 2.4 mg/dL    Comment: Performed at [facility], [address], [place], [place] [address]  Brain natriuretic peptide (order ONLY if patient c/o SOB)     Status: Abnormal   Collection Time: 04/08/20  4:31 PM  Result Value Ref Range   B Natriuretic Peptide 468.0 (H) 0.0 - 100.0 pg/mL    Comment: Performed at [facility], [address], [place], [place] [address]  CBC     Status: Abnormal   Collection Time: 04/08/20  4:31 PM  Result Value Ref Range   WBC 10.2 4.0 - 10.5 K/uL   RBC 2.96 (L) 3.87 - 5.11 MIL/uL   Hemoglobin 8.0 (L) 12.0 - 15.0 g/dL   HCT 28.1 (L) 36 - 46 %   MCV 94.9 80.0 - 100.0 fL   MCH 27.0 26.0 - 34.0 pg   MCHC 28.5 (L) 30.0 - 36.0 g/dL   RDW 20.8 (H) 11.5 - 15.5 %   Platelets 361 150 - 400 K/uL   nRBC 0.0 0.0 - 0.2 %    Comment: Performed at [facility], [address], [place], [place] [address]  Comprehensive metabolic panel     Status: Abnormal   Collection Time: 04/08/20  4:31 PM  Result Value Ref Range   Sodium 140 135 - 145 mmol/L   Potassium 2.9 (L) 3.5 - 5.1 mmol/L   Chloride 100 98 - 111 mmol/L   CO2 24 22 - 32 mmol/L   Glucose, Bld 185 (H) 70 - 99 mg/dL    Comment: Glucose reference range applies only to samples taken after fasting for at least 8 hours.   BUN 60 (H) 8 - 23 mg/dL    Creatinine, Ser 2.96 (H) 0.44 - 1.00 mg/dL   Calcium 8.6 (L) 8.9 - 10.3 mg/dL   Total Protein 6.4 (L) 6.5 - 8.1 g/dL   Albumin 2.0 (L) 3.5 - 5.0 g/dL   AST 36 15 - 41 U/L   ALT 37 0 - 44 U/L   Alkaline Phosphatase 153 (H) 38 - 126 U/L   Total Bilirubin 0.4 0.3 - 1.2 mg/dL   GFR calc non Af Amer 14 (L) >60 mL/min   GFR calc Af Amer 16 (L) >60 mL/min   Anion gap 16 (H) 5 - 15    Comment: Performed at [facility], [address] [place], [place] [address]   DG Chest Portable 1 View  Result Date: 04/08/2020 CLINICAL DATA:  Shortness of breath EXAM: PORTABLE  CHEST 1 VIEW COMPARISON:  August 29, 2016 FINDINGS: The cardiomediastinal silhouette is unchanged and enlarged in contour.Atherosclerotic calcifications of the aorta. Small bilateral pleural effusions. No pneumothorax. Diffuse interstitial prominence. Bibasilar homogeneous opacities likely atelectasis. Visualized abdomen is unremarkable. Multilevel degenerative changes of the thoracic spine. IMPRESSION: 1. Small bilateral pleural effusions with bibasilar atelectasis. 2. Diffuse interstitial prominence likely reflects pulmonary edema. Electronically Signed   By: Valentino Saxon MD   On: 04/08/2020 17:05    Pending Labs Unresulted Labs (From admission, onward)          Start     Ordered   Signed and Held  CBC  (enoxaparin (LOVENOX)    CrCl >/= 30 ml/min)  Once,   R       Comments: Baseline for enoxaparin therapy IF NOT ALREADY DRAWN.  Notify MD if PLT < 100 K.    Signed and Held   Signed and Held  Creatinine, serum  (enoxaparin (LOVENOX)    CrCl >/= 30 ml/min)  Once,   R       Comments: Baseline for enoxaparin therapy IF NOT ALREADY DRAWN.    Signed and Held   Signed and Held  Creatinine, serum  (enoxaparin (LOVENOX)    CrCl >/= 30 ml/min)  Weekly,   R     Comments: while on enoxaparin therapy    Signed and Held   Signed and Held  Basic metabolic panel  Tomorrow morning,   R        Signed and Held   Signed and Held  CBC   Tomorrow morning,   R        Signed and Held          Vitals/Pain Today's Vitals   04/08/20 1548 04/08/20 1550  BP:  (!) 151/51  Pulse:  88  Resp:  (!) 26  Temp:  97.6 F (36.4 C)  TempSrc:  Oral  SpO2:  93%  Weight: 128.4 kg   Height: 5\' 5"  (1.651 m)   PainSc: 0-No pain     Isolation Precautions Airborne and Contact precautions  Medications Medications  furosemide (LASIX) 100 mg in dextrose 5 % 50 mL IVPB (100 mg Intravenous New Bag/Given 04/08/20 1812)  magnesium sulfate IVPB 2 g 50 mL (2 g Intravenous New Bag/Given 04/08/20 1825)  potassium chloride SA (KLOR-CON) CR tablet 20 mEq (20 mEq Oral Given 04/08/20 1636)  potassium chloride SA (KLOR-CON) CR tablet 40 mEq (40 mEq Oral Given 04/08/20 1817)    Mobility walks with device Moderate fall risk   Focused Assessments    R Recommendations: See Admitting Provider Note  Report given to:   Additional Notes:

## 2020-04-09 ENCOUNTER — Inpatient Hospital Stay (HOSPITAL_COMMUNITY): Payer: PPO

## 2020-04-09 DIAGNOSIS — Z7189 Other specified counseling: Secondary | ICD-10-CM

## 2020-04-09 DIAGNOSIS — I5033 Acute on chronic diastolic (congestive) heart failure: Secondary | ICD-10-CM

## 2020-04-09 DIAGNOSIS — I5043 Acute on chronic combined systolic (congestive) and diastolic (congestive) heart failure: Secondary | ICD-10-CM

## 2020-04-09 DIAGNOSIS — I4821 Permanent atrial fibrillation: Secondary | ICD-10-CM

## 2020-04-09 DIAGNOSIS — I340 Nonrheumatic mitral (valve) insufficiency: Secondary | ICD-10-CM

## 2020-04-09 DIAGNOSIS — N184 Chronic kidney disease, stage 4 (severe): Secondary | ICD-10-CM

## 2020-04-09 DIAGNOSIS — Z66 Do not resuscitate: Secondary | ICD-10-CM

## 2020-04-09 DIAGNOSIS — N189 Chronic kidney disease, unspecified: Secondary | ICD-10-CM

## 2020-04-09 DIAGNOSIS — I2510 Atherosclerotic heart disease of native coronary artery without angina pectoris: Secondary | ICD-10-CM

## 2020-04-09 DIAGNOSIS — I361 Nonrheumatic tricuspid (valve) insufficiency: Secondary | ICD-10-CM

## 2020-04-09 DIAGNOSIS — Z515 Encounter for palliative care: Secondary | ICD-10-CM

## 2020-04-09 LAB — GLUCOSE, CAPILLARY
Glucose-Capillary: 153 mg/dL — ABNORMAL HIGH (ref 70–99)
Glucose-Capillary: 188 mg/dL — ABNORMAL HIGH (ref 70–99)
Glucose-Capillary: 163 mg/dL — ABNORMAL HIGH (ref 70–99)
Glucose-Capillary: 193 mg/dL — ABNORMAL HIGH (ref 70–99)

## 2020-04-09 LAB — ECHOCARDIOGRAM COMPLETE
AR max vel: 0.77 cm2
AV Area VTI: 0.75 cm2
AV Area mean vel: 0.7 cm2
AV Mean grad: 12.3 mmHg
AV Peak grad: 19.7 mmHg
Ao pk vel: 2.22 m/s
Area-P 1/2: 3.75 cm2
Height: 65 in
MV M vel: 5.23 m/s
MV Peak grad: 109.4 mmHg
S' Lateral: 2.6 cm
Weight: 4532.66 oz

## 2020-04-09 LAB — CBC
HCT: 28.7 % — ABNORMAL LOW (ref 36.0–46.0)
Hemoglobin: 8.1 g/dL — ABNORMAL LOW (ref 12.0–15.0)
MCH: 26.8 pg (ref 26.0–34.0)
MCHC: 28.2 g/dL — ABNORMAL LOW (ref 30.0–36.0)
MCV: 95 fL (ref 80.0–100.0)
Platelets: 396 10*3/uL (ref 150–400)
RBC: 3.02 MIL/uL — ABNORMAL LOW (ref 3.87–5.11)
RDW: 20.7 % — ABNORMAL HIGH (ref 11.5–15.5)
WBC: 12.4 10*3/uL — ABNORMAL HIGH (ref 4.0–10.5)
nRBC: 0.2 % (ref 0.0–0.2)

## 2020-04-09 LAB — BASIC METABOLIC PANEL
Anion gap: 15 (ref 5–15)
BUN: 60 mg/dL — ABNORMAL HIGH (ref 8–23)
CO2: 23 mmol/L (ref 22–32)
Calcium: 9.2 mg/dL (ref 8.9–10.3)
Chloride: 102 mmol/L (ref 98–111)
Creatinine, Ser: 2.83 mg/dL — ABNORMAL HIGH (ref 0.44–1.00)
GFR calc Af Amer: 17 mL/min — ABNORMAL LOW (ref >60)
GFR calc non Af Amer: 15 mL/min — ABNORMAL LOW (ref >60)
Glucose, Bld: 175 mg/dL — ABNORMAL HIGH (ref 70–99)
Potassium: 3.7 mmol/L (ref 3.5–5.1)
Sodium: 140 mmol/L (ref 135–145)

## 2020-04-09 LAB — HEMOGLOBIN A1C
Hgb A1c MFr Bld: 7.5 % — ABNORMAL HIGH (ref 4.8–5.6)
Mean Plasma Glucose: 168.55 mg/dL

## 2020-04-09 LAB — MAGNESIUM: Magnesium: 2.1 mg/dL (ref 1.7–2.4)

## 2020-04-09 NOTE — Plan of Care (Signed)

## 2020-04-09 NOTE — Progress Notes (Signed)
*  PRELIMINARY RESULTS* Echocardiogram 2D Echocardiogram has been performed.  Kristina Howell 04/09/2020, 10:46 AM

## 2020-04-09 NOTE — Consult Note (Signed)
Cardiology Consultation:   Patient ID: Kristina Howell; [id number]; [birth year]   Admit date: 04/08/2020 Date of Consult: 04/09/2020  Primary Care Provider: Rosalee Kaufman, PA-C Primary Cardiologist: Rozann Lesches, MD Primary Electrophysiologist: None   Patient Profile:   Kristina Howell is an 84 y.o. female with a history of atrial fibrillation, multivessel CAD status post DES to the LAD and RCA in 2018, cardiomyopathy, CKD stage V, hypertension, and type 2 diabetes mellitus who is being seen today for the evaluation of acute diastolic heart failure with fluid overload at the request of Dr. Dyann Kief.  History of Present Illness:   [name] is a patient of mine recently assessed via telehealth encounter on September 20, please refer to the documentation.  I was concerned about acute on chronic combined heart failure in the setting of worsening renal disease and despite escalation in outpatient Demadex to 100 mg twice daily recently by her nephrologist Dr. Theador Hawthorne.  I discussed with her likely need for hospitalization for use of IV diuretics.  She did in fact present to the ER yesterday reporting continued shortness of breath and lack of adequate urine output and decrease in weight despite change in her oral diuretics.  She is currently admitted to the hospitalist service for further management.  She does not report any obvious angina or sense of palpitations at baseline.  Legs have been swollen bilaterally up to her thighs, also some skin breakdown.  She has been placed on Lasix 60 mg IV every 8 hours so far with 1400 cc out more than in.  In 2.96 down to 2.83 and potassium normal at 3.7.  Initial BNP 468.  Chest x-ray shows small bilateral pleural effusions and atelectasis with diffuse interstitial edema.  Follow-up echocardiogram shows LVEF 60 to 65% range representing an improvement compared to last assessment, mild to moderate RV dysfunction, unable to estimate PASP.  Past  Medical History:  Diagnosis Date  . Atrial fibrillation ([place])   . CAD (coronary artery disease)    Multivessel, DES to LAD and RCA 08/2016  . Cardiomyopathy (HCC)    LVEF 45%  . CKD (chronic kidney disease) stage 5, GFR less than 15 ml/min (HCC)   . Essential hypertension   . Gout   . Pancreatitis, recurrent   . Type 2 diabetes mellitus ([place])     Past Surgical History:  Procedure Laterality Date  . CHOLECYSTECTOMY    . CORONARY STENT INTERVENTION N/A 09/08/2016   Procedure: Coronary Stent Intervention;  Surgeon: Belva Crome, MD;  Location: [place] [facility];  Service: Cardiovascular;  Laterality: N/A;  . LEFT HEART CATH AND CORONARY ANGIOGRAPHY N/A 09/03/2016   Procedure: Left Heart Cath and Coronary Angiography;  Surgeon: Nelva Bush, MD;  Location: [facility];  Service: Cardiovascular;  Laterality: N/A;  . TONSILLECTOMY       Inpatient Medications: Scheduled Meds: . allopurinol  300 mg Oral Daily  . apixaban  2.5 mg Oral BID  . aspirin EC  81 mg Oral Daily  . cholecalciferol  2,000 Units Oral Daily  . furosemide  60 mg Intravenous Q8H  . insulin aspart  0-6 Units Subcutaneous TID WC  . metolazone  2.5 mg Oral Daily  . metoprolol succinate  50 mg Oral QHS  . rosuvastatin  20 mg Oral Daily    PRN Meds: acetaminophen **OR** acetaminophen, ondansetron **OR** ondansetron (ZOFRAN) IV, traMADol  Allergies:   No Known Allergies  Social History:   Social History   Tobacco Use  .  Smoking status: Never Smoker  . Smokeless tobacco: Never Used  Substance Use Topics  . Alcohol use: No    Family History:   The patient's family history includes Heart disease in her father.  ROS:  No cough, fevers or chills.  Physical Exam/Data:   Vitals:   04/08/20 2138 04/09/20 0500 04/09/20 0601 04/09/20 1408  BP: (!) 147/96  (!) 149/67 130/64  Pulse: 90  89 83  Resp: (!) 21  20   Temp: 97.7 F (36.5 C)  98 F (36.7 C) 97.8 F (36.6 C)  TempSrc: Oral   Oral  SpO2:  99%  95% 99%  Weight:  128.5 kg    Height:        Intake/Output Summary (Last 24 hours) at 04/09/2020 1447 Last data filed at 04/09/2020 1000 Gross per 24 hour  Intake 100 ml  Output 1500 ml  Net -1400 ml   Filed Weights   04/08/20 1548 04/09/20 0500  Weight: 128.4 kg 128.5 kg   Body mass index is 47.14 kg/m.   Gen: Morbidly obese woman, no distress. HEENT: Conjunctiva and lids normal, oropharynx clear. Neck: Supple, difficult to assess JVP, no carotid bruits. Lungs: Decreased breath sounds at the bases, scattered crackles in the midlung zones. Cardiac: Irregularly irregular, no S3, soft systolic murmur. Abdomen: Obese, nontender. Extremities: 3+ leg edema up to the thighs. Skin: Areas of skin breakdown on the legs. Musculoskeletal: No kyphosis. Neuropsychiatric: Alert and oriented x3, affect grossly appropriate.  EKG:  An ECG dated 04/08/2020 was personally reviewed today and demonstrated:  Rate controlled atrial fibrillation with poor R wave progression, possible old lateral infarct pattern, nonspecific ST-T changes.  Telemetry:  I personally reviewed telemetry which shows atrial fibrillation.  Laboratory Data:  Chemistry Recent Labs  Lab 04/08/20 1631 04/09/20 0603  NA 140 140  K 2.9* 3.7  CL 100 102  CO2 24 23  GLUCOSE 185* 175*  BUN 60* 60*  CREATININE 2.96* 2.83*  CALCIUM 8.6* 9.2  GFRNONAA 14* 15*  GFRAA 16* 17*  ANIONGAP 16* 15    Recent Labs  Lab 04/08/20 1631  PROT 6.4*  ALBUMIN 2.0*  AST 36  ALT 37  ALKPHOS 153*  BILITOT 0.4   Hematology Recent Labs  Lab 04/08/20 1631 04/09/20 0603  WBC 10.2 12.4*  RBC 2.96* 3.02*  HGB 8.0* 8.1*  HCT 28.1* 28.7*  MCV 94.9 95.0  MCH 27.0 26.8  MCHC 28.5* 28.2*  RDW 20.8* 20.7*  PLT 361 396   Cardiac EnzymesNo results for input(s): TROPONINIHS in the last 720 hours. BNP Recent Labs  Lab 04/08/20 1631  BNP 468.0*     Radiology/Studies:  DG Chest Portable 1 View  Result Date:  04/08/2020 CLINICAL DATA:  Shortness of breath EXAM: PORTABLE CHEST 1 VIEW COMPARISON:  August 29, 2016 FINDINGS: The cardiomediastinal silhouette is unchanged and enlarged in contour.Atherosclerotic calcifications of the aorta. Small bilateral pleural effusions. No pneumothorax. Diffuse interstitial prominence. Bibasilar homogeneous opacities likely atelectasis. Visualized abdomen is unremarkable. Multilevel degenerative changes of the thoracic spine. IMPRESSION: 1. Small bilateral pleural effusions with bibasilar atelectasis. 2. Diffuse interstitial prominence likely reflects pulmonary edema. Electronically Signed   By: Valentino Saxon MD   On: 04/08/2020 17:05   ECHOCARDIOGRAM COMPLETE  Result Date: 04/09/2020    ECHOCARDIOGRAM REPORT   Patient Name:   UVA RUNKEL Date of Exam: 04/09/2020 Medical Rec #:  [id number]       Height:       65.0 in Accession #:  [id number]      Weight:       283.3 lb Date of Birth:  01/12/1936        BSA:          2.294 m Patient Age:    61 years        BP:           149/67 mmHg Patient Gender: F               HR:           87 bpm. Exam Location:  [facility] Procedure: 2D Echo, Cardiac Doppler and Color Doppler Indications:    Dyspnea 786.09 / R06.00  History:        Patient has prior history of Echocardiogram examinations, most                 recent 08/30/2016. Arrythmias:Atrial Fibrillation; Risk                 Factors:Hypertension and Diabetes.  Sonographer:    Leavy Cella RDCS (AE) Referring Phys: [id number] [place]  1. Left ventricular ejection fraction, by estimation, is 60 to 65%. The left ventricle has normal function. The left ventricle has no regional wall motion abnormalities. There is mild left ventricular hypertrophy. Left ventricular diastolic parameters are indeterminate in the setting of atrial fibrillation.  2. Right ventricular systolic function is mildly to moderately reduced. The right ventricular size is normal. Tricuspid  regurgitation signal is inadequate for assessing PA pressure.  3. Left atrial size was mildly dilated.  4. The mitral valve is abnormal. Mild mitral valve regurgitation. Moderate mitral annular calcification.  5. The aortic valve is tricuspid. There is moderate calcification of the aortic valve. Aortic valve regurgitation is not visualized. Aortic valve mean gradient measures 12.3 mmHg.  6. The inferior vena cava is dilated in size with >50% respiratory variability, suggesting right atrial pressure of 8 mmHg. FINDINGS  Left Ventricle: Left ventricular ejection fraction, by estimation, is 60 to 65%. The left ventricle has normal function. The left ventricle has no regional wall motion abnormalities. The left ventricular internal cavity size was normal in size. There is  mild left ventricular hypertrophy. Left ventricular diastolic parameters are indeterminate. Right Ventricle: The right ventricular size is normal. No increase in right ventricular wall thickness. Right ventricular systolic function is moderately reduced. Tricuspid regurgitation signal is inadequate for assessing PA pressure. Left Atrium: Left atrial size was mildly dilated. Right Atrium: Right atrial size was normal in size. Pericardium: There is no evidence of pericardial effusion. Mitral Valve: The mitral valve is abnormal. Moderate mitral annular calcification. Mild mitral valve regurgitation. Tricuspid Valve: The tricuspid valve is grossly normal. Tricuspid valve regurgitation is mild. Aortic Valve: The aortic valve is tricuspid. There is moderate calcification of the aortic valve. There is mild to moderate aortic valve annular calcification. Aortic valve regurgitation is not visualized. Aortic valve mean gradient measures 12.3 mmHg. Aortic valve peak gradient measures 19.7 mmHg. Aortic valve area, by VTI measures 0.75 cm. Pulmonic Valve: The pulmonic valve was grossly normal. Pulmonic valve regurgitation is trivial. Aorta: The aortic root is  normal in size and structure. Venous: The inferior vena cava is dilated in size with greater than 50% respiratory variability, suggesting right atrial pressure of 8 mmHg. IAS/Shunts: No atrial level shunt detected by color flow Doppler. Additional Comments: There is pleural effusion in the left lateral region.  LEFT VENTRICLE PLAX 2D LVIDd:  4.16 cm  Diastology LVIDs:         2.60 cm  LV e' medial:    8.38 cm/s LV PW:         1.28 cm  LV E/e' medial:  16.0 LV IVS:        1.15 cm  LV e' lateral:   7.51 cm/s LVOT diam:     1.80 cm  LV E/e' lateral: 17.8 LV SV:         38 LV SV Index:   17 LVOT Area:     2.54 cm  RIGHT VENTRICLE RV S prime:     9.57 cm/s LEFT ATRIUM              Index       RIGHT ATRIUM           Index LA diam:        5.10 cm  2.22 cm/m  RA Area:     17.30 cm LA Vol (A2C):   111.0 ml 48.40 ml/m RA Volume:   49.70 ml  21.67 ml/m LA Vol (A4C):   55.0 ml  23.98 ml/m LA Biplane Vol: 79.0 ml  34.44 ml/m  AORTIC VALVE AV Area (Vmax):    0.77 cm AV Area (Vmean):   0.70 cm AV Area (VTI):     0.75 cm AV Vmax:           222.00 cm/s AV Vmean:          167.667 cm/s AV VTI:            0.507 m AV Peak Grad:      19.7 mmHg AV Mean Grad:      12.3 mmHg LVOT Vmax:         67.23 cm/s LVOT Vmean:        46.400 cm/s LVOT VTI:          0.149 m LVOT/AV VTI ratio: 0.29  AORTA Ao Root diam: 2.30 cm MITRAL VALVE MV Area (PHT): 3.75 cm     SHUNTS MV Decel Time: 202 msec     Systemic VTI:  0.15 m MR Peak grad: 109.4 mmHg    Systemic Diam: 1.80 cm MR Mean grad: 78.0 mmHg MR Vmax:      523.00 cm/s MR Vmean:     421.0 cm/s MV E velocity: 134.00 cm/s Rozann Lesches MD Electronically signed by Rozann Lesches MD Signature Date/Time: 04/09/2020/12:59:54 PM    Final     Assessment and Plan:   1.  Acute on chronic diastolic heart failure with moderate RV dysfunction and fluid overload.  She is approximately 10 to 15 pounds over baseline.  Most recently on Demadex 100 mg twice daily as an outpatient with an  effective diuresis.  This is complicated by progressive renal disease, CKD stage IV-V.  2.  Permanent atrial fibrillation, on Eliquis for stroke prophylaxis and Toprol-XL for heart rate control.  3.  CKD stage IV-V, followed by Dr. Theador Hawthorne.  She has declined consideration for hemodialysis so far.  4.  Known history of lymphedema.  She has not been undergoing any mechanical compression recently.  5.  Multivessel CAD status post DES to the LAD and RCA in 2018.  No obvious angina symptoms reported.  On low-dose aspirin, Norvasc, Toprol-XL, and Crestor as an outpatient.  Patient is beginning to diurese on IV Lasix along with metolazone, would continue current dose for now and reassess renal function tomorrow.  She might actually respond to a  Lasix drip if necessary, but that will depend on adequacy of her urine output on divided dose Lasix and stability in her renal function.  Would continue baseline cardiac regimen, agree with stopping ACE inhibitor at this point (had been continued by Dr. Theador Hawthorne).  No clear indication for ischemic work-up at this point and we would be limited in any angiographic studies anyway.  When leg edema has improved somewhat more, would also consider resuming mechanical compression for her lymphedema.  Signed, Rozann Lesches, MD  04/09/2020 2:47 PM

## 2020-04-09 NOTE — Progress Notes (Signed)
PROGRESS NOTE    Kristina Howell  HCW:[id number] DOB: 1936-03-04 DOA: 04/08/2020 PCP: Rosalee Kaufman, PA-C    Chief Complaint  Patient presents with  . Shortness of Breath    Brief Narrative:  As per H&P written by Dr. Cathlean Sauer on 04/08/2020 Kristina Howell is a 84 y.o. female with medical history significant of atrial fibrillation, coronary artery disease, diastolic heart failure, stage IV chronic kidney disease, hypertension, obesity and type 2 diabetes mellitus. Patient reports worsening lower extremity edema for the last 4 months, over the last 10 days she has developed progressive and worsening dyspnea, to the point where she is symptomatic with minimal efforts like talking or going to the bathroom.  She has been sleeping in a recliner for long time, and she acknowledged orthopnea.    Dyspnea described as moderate to severe in intensity, worse with exertion, no frank improving factors, no associated chest pain or palpitations.  No wheezing or cough.  Positive weight gain 10 pounds over the last 7 days.  She has been seen as an outpatient by her cardiologist and her symptoms have been refractive to up titration of diuretic therapy with torsemide.   ED Course: Patient was found significantly hypervolemic, she has received 100 mg of intravenous furosemide and 40 mEq of potassium chloride along with 2 g of magnesium sulfate.  Referred for admission for evaluation.  Assessment & Plan: 1-acute respiratory failure with hypoxia in the setting of acute on chronic combined heart failure -Follow echo results -Continue low-sodium diet, strict I's and O's and daily weights -Cardiology service has been consulted -Continue IV diuresis and the use of metolazone -Close monitoring of electrolytes and renal function -Begin convenience for patient includes chronic a stage V kidney disease; declining HD. -Having difficulties speaking in full sentences, wearing 2 L nasal cannula supplementation  and complaining of orthopnea. -Still with massive fluid overload appreciated on exam. -Continue metoprolol  2-Persistent atrial fibrillation (HCC) -Continue metoprolol for rate control -Chronically on apixaban.  3-hyperlipidemia -Continue statins  4-Coronary artery disease due to lipid rich plaque -Continue aspirin, beta-blocker and statins -Patient denies chest pain.  5-type II diabetes mellitus ([place]) with nephropathy -Continue sliding scale insulin. -Follow CBG  6-Essential hypertension, benign -stable currently -will follow VS -continue current antihypertensive regimen.  7-Obesity, Class III, BMI 40-49.9 (morbid obesity) (HCC) -Body mass index is 47.14 kg/m. -low calorie and portion control discussed with patient  8-acute on CKD stage 5 due to CHF and further progression of renal disease -continue to closely follow renal function trend -continue current diuretic regimen -patient has declined HD -palliative care consulted   DVT prophylaxis: Chronically on apixaban. Code Status: Full code for now. Family Communication: Son over the phone (04/09/2020). Disposition:   Status is: Inpatient  Dispo: The [facility] is from: [name] (assisted living [facility]              Anticipated d/c is to: To be determined              Anticipated d/c date is: To be determined.              Patient currently is not medically stable for discharge; still complaining of shortness of breath with increased amount of fluid overload/anasarca; using 2 L nasal cannula supplementation.  Has expressed the wishes of not pursuing hemodialysis but okay to continue trying aggressive IV diuresis.  I have initiated goals of care discussion with patient and she is going to talk with her family; inclining to pursuit DNR/DNI.  Consultants:   Cardiology service  Palliative care   Procedures:  See below for x-ray report 2D echo: Pending   Antimicrobials:  None   Subjective: Complaining of  orthopnea, short winded with minimal activity, fluid overload/anasarca findings on physical exam.  Using 2 L nasal cannula supplementation.  Unable to speak in full sentences.  Objective: Vitals:   04/08/20 2020 04/08/20 2138 04/09/20 0500 04/09/20 0601  BP:  (!) 147/96  (!) 149/67  Pulse: 87 90  89  Resp:  (!) 21  20  Temp:  97.7 F (36.5 C)  98 F (36.7 C)  TempSrc:  Oral    SpO2: 92% 99%  95%  Weight:   128.5 kg   Height:        Intake/Output Summary (Last 24 hours) at 04/09/2020 1345 Last data filed at 04/09/2020 1000 Gross per 24 hour  Intake 100 ml  Output 1500 ml  Net -1400 ml   Filed Weights   04/08/20 1548 04/09/20 0500  Weight: 128.4 kg 128.5 kg    Examination:  General exam: Afebrile, no chest pain, no nausea, no vomiting.  Significant fluid overload appreciated (anasarca); having difficulty speaking in full sentences and complaining of orthopnea.  2 L nasal cannula supplementation in place. Respiratory system: Positive scattered rhonchi; no wheezing, tachypnea and short winded with minimal activity, positive fine crackles. Cardiovascular system: S1 & S2 heard, RRR.  Unable to properly assess JVD with body habitus; no rubs, no gallops, 3+ edema bilaterally with chronic stasis dermatitis changes. Gastrointestinal system: Abdomen is nondistended, soft and nontender. No organomegaly or masses felt. Normal bowel sounds heard. Central nervous system: Alert and oriented. No focal neurological deficits. Extremities: No cyanosis or clubbing. Skin: no petechiae; positive lymphedema and chronic swelling on her legs bilaterally.  Psychiatry: Judgement and insight appear normal. Mood & affect appropriate.     Data Reviewed: I have personally reviewed following labs and imaging studies  CBC: Recent Labs  Lab 04/08/20 1631 04/09/20 0603  WBC 10.2 12.4*  HGB 8.0* 8.1*  HCT 28.1* 28.7*  MCV 94.9 95.0  PLT 361 381    Basic Metabolic Panel: Recent Labs  Lab  04/08/20 1631 04/09/20 0603  NA 140 140  K 2.9* 3.7  CL 100 102  CO2 24 23  GLUCOSE 185* 175*  BUN 60* 60*  CREATININE 2.96* 2.83*  CALCIUM 8.6* 9.2  MG 1.6* 2.1    GFR: Estimated Creatinine Clearance: 20.7 mL/min (A) (by C-G formula based on SCr of 2.83 mg/dL (H)).  Liver Function Tests: Recent Labs  Lab 04/08/20 1631  AST 36  ALT 37  ALKPHOS 153*  BILITOT 0.4  PROT 6.4*  ALBUMIN 2.0*    CBG: Recent Labs  Lab 04/08/20 2225 04/09/20 0753 04/09/20 1133  GLUCAP 153* 153* 188*     Recent Results (from the past 240 hour(s))  SARS Coronavirus 2 by RT PCR (hospital order, performed in [facility] hospital lab) Nasopharyngeal Nasopharyngeal Swab     Status: None   Collection Time: 04/08/20  4:03 PM   Specimen: Nasopharyngeal Swab  Result Value Ref Range Status   SARS Coronavirus 2 NEGATIVE NEGATIVE Final    Comment: (NOTE) SARS-CoV-2 target nucleic acids are NOT DETECTED.  The SARS-CoV-2 RNA is generally detectable in upper and lower respiratory specimens during the acute phase of infection. The lowest concentration of SARS-CoV-2 viral copies this assay can detect is 250 copies / mL. A negative result does not preclude SARS-CoV-2 infection and should not be  used as the sole basis for treatment or other patient management decisions.  A negative result may occur with improper specimen collection / handling, submission of specimen other than nasopharyngeal swab, presence of viral mutation(s) within the areas targeted by this assay, and inadequate number of viral copies (<250 copies / mL). A negative result must be combined with clinical observations, patient history, and epidemiological information.  Fact Sheet for Patients:   StrictlyIdeas.no  Fact Sheet for Healthcare Providers: BankingDealers.co.za  This test is not yet approved or  cleared by the [place] FDA and has been authorized for detection and/or  diagnosis of SARS-CoV-2 by FDA under an Emergency Use Authorization (EUA).  This EUA will remain in effect (meaning this test can be used) for the duration of the COVID-19 declaration under Section 564(b)(1) of the Act, 21 U.S.C. section 360bbb-3(b)(1), unless the authorization is terminated or revoked sooner.  Performed at [facility], [address] [place], [place] [address]      Radiology Studies: DG Chest Portable 1 View  Result Date: 04/08/2020 CLINICAL DATA:  Shortness of breath EXAM: PORTABLE CHEST 1 VIEW COMPARISON:  August 29, 2016 FINDINGS: The cardiomediastinal silhouette is unchanged and enlarged in contour.Atherosclerotic calcifications of the aorta. Small bilateral pleural effusions. No pneumothorax. Diffuse interstitial prominence. Bibasilar homogeneous opacities likely atelectasis. Visualized abdomen is unremarkable. Multilevel degenerative changes of the thoracic spine. IMPRESSION: 1. Small bilateral pleural effusions with bibasilar atelectasis. 2. Diffuse interstitial prominence likely reflects pulmonary edema. Electronically Signed   By: Valentino Saxon MD   On: 04/08/2020 17:05   ECHOCARDIOGRAM COMPLETE  Result Date: 04/09/2020    ECHOCARDIOGRAM REPORT   Patient Name:   Kristina Howell Date of Exam: 04/09/2020 Medical Rec #:  [id number]       Height:       65.0 in Accession #:    [id number]      Weight:       283.3 lb Date of Birth:  Dec 10, 1935        BSA:          2.294 m Patient Age:    87 years        BP:           149/67 mmHg Patient Gender: F               HR:           87 bpm. Exam Location:  [facility] Procedure: 2D Echo, Cardiac Doppler and Color Doppler Indications:    Dyspnea 786.09 / R06.00  History:        Patient has prior history of Echocardiogram examinations, most                 recent 08/30/2016. Arrythmias:Atrial Fibrillation; Risk                 Factors:Hypertension and Diabetes.  Sonographer:    Leavy Cella RDCS (AE) Referring Phys: [id number]  1. Left ventricular ejection fraction, by estimation, is 60 to 65%. The left ventricle has normal function. The left ventricle has no regional wall motion abnormalities. There is mild left ventricular hypertrophy. Left ventricular diastolic parameters are indeterminate in the setting of atrial fibrillation.  2. Right ventricular systolic function is mildly to moderately reduced. The right ventricular size is normal. Tricuspid regurgitation signal is inadequate for assessing PA pressure.  3. Left atrial size was mildly dilated.  4. The mitral valve is abnormal. Mild mitral valve regurgitation. Moderate mitral  annular calcification.  5. The aortic valve is tricuspid. There is moderate calcification of the aortic valve. Aortic valve regurgitation is not visualized. Aortic valve mean gradient measures 12.3 mmHg.  6. The inferior vena cava is dilated in size with >50% respiratory variability, suggesting right atrial pressure of 8 mmHg. FINDINGS  Left Ventricle: Left ventricular ejection fraction, by estimation, is 60 to 65%. The left ventricle has normal function. The left ventricle has no regional wall motion abnormalities. The left ventricular internal cavity size was normal in size. There is  mild left ventricular hypertrophy. Left ventricular diastolic parameters are indeterminate. Right Ventricle: The right ventricular size is normal. No increase in right ventricular wall thickness. Right ventricular systolic function is moderately reduced. Tricuspid regurgitation signal is inadequate for assessing PA pressure. Left Atrium: Left atrial size was mildly dilated. Right Atrium: Right atrial size was normal in size. Pericardium: There is no evidence of pericardial effusion. Mitral Valve: The mitral valve is abnormal. Moderate mitral annular calcification. Mild mitral valve regurgitation. Tricuspid Valve: The tricuspid valve is grossly normal. Tricuspid valve regurgitation is mild. Aortic Valve:  The aortic valve is tricuspid. There is moderate calcification of the aortic valve. There is mild to moderate aortic valve annular calcification. Aortic valve regurgitation is not visualized. Aortic valve mean gradient measures 12.3 mmHg. Aortic valve peak gradient measures 19.7 mmHg. Aortic valve area, by VTI measures 0.75 cm. Pulmonic Valve: The pulmonic valve was grossly normal. Pulmonic valve regurgitation is trivial. Aorta: The aortic root is normal in size and structure. Venous: The inferior vena cava is dilated in size with greater than 50% respiratory variability, suggesting right atrial pressure of 8 mmHg. IAS/Shunts: No atrial level shunt detected by color flow Doppler. Additional Comments: There is pleural effusion in the left lateral region.  LEFT VENTRICLE PLAX 2D LVIDd:         4.16 cm  Diastology LVIDs:         2.60 cm  LV e' medial:    8.38 cm/s LV PW:         1.28 cm  LV E/e' medial:  16.0 LV IVS:        1.15 cm  LV e' lateral:   7.51 cm/s LVOT diam:     1.80 cm  LV E/e' lateral: 17.8 LV SV:         38 LV SV Index:   17 LVOT Area:     2.54 cm  RIGHT VENTRICLE RV S prime:     9.57 cm/s LEFT ATRIUM              Index       RIGHT ATRIUM           Index LA diam:        5.10 cm  2.22 cm/m  RA Area:     17.30 cm LA Vol (A2C):   111.0 ml 48.40 ml/m RA Volume:   49.70 ml  21.67 ml/m LA Vol (A4C):   55.0 ml  23.98 ml/m LA Biplane Vol: 79.0 ml  34.44 ml/m  AORTIC VALVE AV Area (Vmax):    0.77 cm AV Area (Vmean):   0.70 cm AV Area (VTI):     0.75 cm AV Vmax:           222.00 cm/s AV Vmean:          167.667 cm/s AV VTI:            0.507 m AV Peak Grad:  19.7 mmHg AV Mean Grad:      12.3 mmHg LVOT Vmax:         67.23 cm/s LVOT Vmean:        46.400 cm/s LVOT VTI:          0.149 m LVOT/AV VTI ratio: 0.29  AORTA Ao Root diam: 2.30 cm MITRAL VALVE MV Area (PHT): 3.75 cm     SHUNTS MV Decel Time: 202 msec     Systemic VTI:  0.15 m MR Peak grad: 109.4 mmHg    Systemic Diam: 1.80 cm MR Mean grad: 78.0  mmHg MR Vmax:      523.00 cm/s MR Vmean:     421.0 cm/s MV E velocity: 134.00 cm/s Rozann Lesches MD Electronically signed by Rozann Lesches MD Signature Date/Time: 04/09/2020/12:59:54 PM    Final     Scheduled Meds: . allopurinol  300 mg Oral Daily  . apixaban  2.5 mg Oral BID  . aspirin EC  81 mg Oral Daily  . cholecalciferol  2,000 Units Oral Daily  . furosemide  60 mg Intravenous Q8H  . insulin aspart  0-6 Units Subcutaneous TID WC  . metolazone  2.5 mg Oral Daily  . metoprolol succinate  50 mg Oral QHS  . rosuvastatin  20 mg Oral Daily   Continuous Infusions:   LOS: 1 day    Time spent: 35 minutes   Barton Dubois, MD [facility]   To contact the attending provider between 7A-7P or the covering provider during after hours 7P-7A, please log into the web site [email or web address] and access using universal [place] password for that web site. If you do not have the password, please call the hospital operator.  04/09/2020, 1:45 PM

## 2020-04-09 NOTE — Consult Note (Signed)
Consultation Note Date: 04/09/2020   Patient Name: Kristina Howell  DOB: 1936-07-02  MRN: [id number]  Age / Sex: 84 y.o., female  PCP: Rosine Door Referring Physician: Barton Dubois, MD  Reason for Consultation: Establishing goals of care  HPI/Patient Profile: 84 y.o. female  with past medical history of atrial fibrillation, CAD, diastolic heart failure, stage IV CKD, chronic lymphedema, hypertension, obesity, type 2 DM admitted on 04/08/2020 with shortness of breath and BLE swelling. Hospital admission for acute decompensation of diastolic heart failure, pulmonary edema, chronic and persistent atrial fibrillation, and AKI with underlying CKD. Pending cardiology consultation. Receiving aggressive IV diuresis. Palliative medicine consultation for goals of care.   Clinical Assessment and Goals of Care:  I have reviewed medical records, discussed with care team, and met with patient and son Kristina Howell) at bedside to discuss goals of care. Patient is awake, alert, oriented and able to participate in discussion.   I introduced Palliative Medicine as specialized medical care for people living with serious illness. It focuses on providing relief from the symptoms and stress of a serious illness. The goal is to improve quality of life for both the patient and the family.  Widowed. Four children, three boys and a girl. Recently moved into ALF [facility]. Chronic lymphedema impacting functional status.   Discussed events leading up to admission and course of hospitalization including diagnoses, interventions, plan of care. Patient appreciative of updates from attending and cardiology.   I attempted to elicit values and goals of care important to the patient. Advanced directives, concepts specific to code status, artifical feeding and hydration, and rehospitalization were considered and discussed. Patient  reports she has a documented living will with son, Kristina Howell as [place]. Documentation requested. Kristina Howell will search for this at home. Kristina Howell speaks of respecting his mother's wishes, whatever they may be and that his siblings would say the same.   Introduced and discussed MOST form with patient and son. Patient is ready to complete today. Her wishes include: DNR/DNI, limited additional interventions including CPAP/BiPAP if indicated, IVF for time trial, ABX if indicated, and NO feeding tube. Patient is also very clear that she would NOT wish for dialysis if this was necessary. She feels this would just prolong her suffering. NO dialysis also listed on her MOST form. Electronic Vynca MOST form and durable DNR completed. Copies made for chart and family. Son understands and respects his mother's decisions.   Discussed code status change to DNR/DNI but otherwise continue current plan of care and medical management. Discussed watchful waiting and time for outcomes from aggressive diuresis. We did discuss challenges with managing heart failure with CKD and hope that kidney function will continue to trend down with aggressive diuresis.   Questions and concerns were addressed. PMT contact information given. Reassured of ongoing support from PMT.     SUMMARY OF RECOMMENDATIONS    Patient clear on her wishes for DNR/DNI code status. NO dialysis.   Son Kristina Howell) is reported documented POA. Documentation requested. Kristina Howell at bedside during  GOC discussion and understands/respects his mother's wishes for DNR/DNI code status.   Continue current plan of care and medical management. Watchful waiting and time for outcomes.   Patient is ready to complete MOST form today. Decisions include: DNR/DNI, limited additional interventions including CPAP/BiPAP if indicated, IVF for time trial, ABX if indicated, NO feeding tube, NO dialysis. Electronic Vynca MOST form and durable DNR completed.   May benefit from outpatient  palliative referral. If lack of improvement inpatient, may need further hospice discussions with patient/family.  PMT will follow.   Code Status/Advance Care Planning:  DNR/DNI  Symptom Management:   Per attending  Palliative Prophylaxis:   Aspiration, Bowel Regimen, Delirium Protocol and Frequent Pain Assessment   Psycho-social/Spiritual:   Desire for further Chaplaincy support: yes  Additional Recommendations: Caregiving  Support/Resources and Compassionate Wean Education  Prognosis:   Unable to determine: guarded  Discharge Planning: To Be Determined      Primary Diagnoses: Present on Admission: . Persistent atrial fibrillation ([place]) . Coronary artery disease due to lipid rich plaque . Mixed hyperlipidemia . Essential hypertension, benign   I have reviewed the medical record, interviewed the patient and family, and examined the patient. The following aspects are pertinent.  Past Medical History:  Diagnosis Date  . Atrial fibrillation ([place])   . CAD (coronary artery disease)    Multivessel, DES to LAD and RCA 08/2016  . Cardiomyopathy (HCC)    LVEF 45%  . CKD (chronic kidney disease) stage 5, GFR less than 15 ml/min (HCC)   . Essential hypertension   . Gout   . Pancreatitis, recurrent   . Type 2 diabetes mellitus ([place])    Social History   Socioeconomic History  . Marital status: Widowed    Spouse name: Not on file  . Number of children: Not on file  . Years of education: Not on file  . Highest education level: Not on file  Occupational History  . Not on file  Tobacco Use  . Smoking status: Never Smoker  . Smokeless tobacco: Never Used  Vaping Use  . Vaping Use: Never used  Substance and Sexual Activity  . Alcohol use: No  . Drug use: No  . Sexual activity: Not on file  Other Topics Concern  . Not on file  Social History Narrative  . Not on file   Social Determinants of Health   Financial Resource Strain:   . Difficulty of Paying Living  Expenses: Not on file  Food Insecurity:   . Worried About Charity fundraiser in the Last Year: Not on file  . Ran Out of Food in the Last Year: Not on file  Transportation Needs:   . Lack of Transportation (Medical): Not on file  . Lack of Transportation (Non-Medical): Not on file  Physical Activity:   . Days of Exercise per Week: Not on file  . Minutes of Exercise per Session: Not on file  Stress:   . Feeling of Stress : Not on file  Social Connections:   . Frequency of Communication with Friends and Family: Not on file  . Frequency of Social Gatherings with Friends and Family: Not on file  . Attends Religious Services: Not on file  . Active Member of Clubs or Organizations: Not on file  . Attends Archivist Meetings: Not on file  . Marital Status: Not on file   Family History  Problem Relation Age of Onset  . Heart disease Father    Scheduled Meds: .  allopurinol  300 mg Oral Daily  . apixaban  2.5 mg Oral BID  . aspirin EC  81 mg Oral Daily  . cholecalciferol  2,000 Units Oral Daily  . furosemide  60 mg Intravenous Q8H  . insulin aspart  0-6 Units Subcutaneous TID WC  . metolazone  2.5 mg Oral Daily  . metoprolol succinate  50 mg Oral QHS  . rosuvastatin  20 mg Oral Daily   Continuous Infusions: PRN Meds:.acetaminophen **OR** acetaminophen, ondansetron **OR** ondansetron (ZOFRAN) IV, traMADol Medications Prior to Admission:  Prior to Admission medications   Medication Sig Start Date End Date Taking? Authorizing Provider  allopurinol (ZYLOPRIM) 300 MG tablet Take 300 mg by mouth daily.   Yes [provider]  amLODipine (NORVASC) 5 MG tablet Take 1 tablet (5 mg total) by mouth daily. 08/07/19 02/03/21 Yes Satira Sark, MD  aspirin EC 81 MG tablet Take 81 mg by mouth daily.   Yes [provider]  ELIQUIS 2.5 MG TABS tablet TAKE 1 TABLET(2.5 MG) BY MOUTH TWICE DAILY 12/03/19  Yes Satira Sark, MD  metoprolol succinate (TOPROL-XL) 50 MG  24 hr tablet Take 1 tablet (50 mg total) by mouth at bedtime. 03/12/20  Yes Satira Sark, MD  TOUJEO SOLOSTAR 300 UNIT/ML SOPN INJECT 60 UNITS INTO THE SKIN AT BEDTIME 10/23/18  Yes Nida, Marella Chimes, MD  traMADol (ULTRAM) 50 MG tablet Take 50 mg by mouth every 6 (six) hours as needed for moderate pain.    Yes [provider]  Cholecalciferol (VITAMIN D) 50 MCG (2000 UT) CAPS Take 1 capsule by mouth daily.    [provider]  Insulin Pen Needle (PEN NEEDLES) 31G X 8 MM MISC 1 each by Does not apply route 4 (four) times daily. 10/14/17   Cassandria Anger, MD  NOVOLOG FLEXPEN 100 UNIT/ML FlexPen ADMINISTER 10 TO 16 UNITS UNDER THE SKIN THREE TIMES DAILY WITH MEALS 07/31/18   Nida, Marella Chimes, MD  potassium chloride (KLOR-CON) 10 MEQ tablet Take 10 mEq by mouth daily. 11/22/19   [provider]  rosuvastatin (CRESTOR) 20 MG tablet Take 1 tablet (20 mg total) by mouth daily. 04/16/19 02/03/21  Satira Sark, MD  sodium bicarbonate 650 MG tablet Take 650 mg by mouth 3 (three) times daily.    [provider]  torsemide (DEMADEX) 20 MG tablet Take 5 tablets (100 mg total) by mouth in the morning. & 3 tablets (60 mg total) by mouth in the afternoon Patient taking differently: Take 60 mg by mouth daily.  12/27/19   Satira Sark, MD   No Known Allergies Review of Systems  Constitutional: Positive for activity change.  Respiratory: Positive for shortness of breath.   Cardiovascular: Positive for leg swelling.   Physical Exam Vitals and nursing note reviewed.  Constitutional:      General: She is awake.     Appearance: She is ill-appearing.  HENT:     Head: Normocephalic and atraumatic.  Cardiovascular:     Rate and Rhythm: Rhythm irregularly irregular.  Pulmonary:     Effort: No tachypnea, accessory muscle usage or respiratory distress.     Comments: Dyspnea at rest Neurological:     Mental Status: She is alert and oriented to person,  place, and time.  Psychiatric:        Mood and Affect: Mood normal.        Speech: Speech normal.        Behavior: Behavior normal.  Cognition and Memory: Cognition normal.    Vital Signs: BP 130/64 (BP Location: Left Arm)   Pulse 83   Temp 97.8 F (36.6 C) (Oral)   Resp 20   Ht _0  (1.651 m)   Wt 128.5 kg   SpO2 99%   BMI 47.14 kg/m  Pain Scale: 0-10   Pain Score: 0-No pain   SpO2: SpO2: 99 % O2 Device:SpO2: 99 % O2 Flow Rate: .O2 Flow Rate (L/min): 2 L/min  IO: Intake/output summary:   Intake/Output Summary (Last 24 hours) at 04/09/2020 1602 Last data filed at 04/09/2020 1000 Gross per 24 hour  Intake 100 ml  Output 1500 ml  Net -1400 ml    LBM: Last BM Date: 04/08/20 Baseline Weight: Weight: 128.4 kg Most recent weight: Weight: 128.5 kg     Palliative Assessment/Data: PPS 50%   Flowsheet Rows     Most Recent Value  Intake Tab  Referral Department Hospitalist  Unit at Time of Referral Med/Surg Unit  Palliative Care Primary Diagnosis Cardiac  Palliative Care Type New Palliative care  Reason for referral Clarify Goals of Care  Date first seen by Palliative Care 04/09/20  Clinical Assessment  Palliative Performance Scale Score 50%  Psychosocial & Spiritual Assessment  Palliative Care Outcomes  Patient/Family meeting held? Yes  Who was at the meeting? patient and son (brian)  Palliative Care Outcomes Clarified goals of care, Provided end of life care assistance, Provided advance care planning, Provided psychosocial or spiritual support, Changed CPR status, Completed durable DNR, ACP counseling assistance      Time In: 1440 Time Out: 1550 Time Total:70 Greater than 50%  of this time was spent counseling and coordinating care related to the above assessment and plan.  Signed by:  Ihor Dow, DNP, FNP-C Palliative Medicine Team  Phone: [phone] Fax: [phone]   Please contact Palliative Medicine Team phone at [phone] for questions  and concerns.  For individual provider: See Shea Evans

## 2020-04-09 NOTE — TOC Initial Note (Signed)
Transition of Care [facility]) - Initial/Assessment Note    Patient Details  Name: Kristina Howell MRN: [id number] Date of Birth: 26-Jul-1935  [facility] CM/SW Contact:    Salome Arnt, [place] Phone Number: 04/09/2020, 8:51 AM  Clinical Narrative:  Pt admitted due to acute on chronic diastolic heart failure. Pt has been a resident at [facility] since June of this year. She reports she is still adjusting to being at ALF. TOC consulted for CHF screening. Pt reports facility weighs her monthly and she is on a regular diet because a heart healthy diet has additional charges at facility. She has been followed by cardiologist for virtual visits. Pt states she takes medications as prescribed. She plans to return to [facility] when medically stable. LCSW spoke with Cyndia Bent, Therapist, sports at [facility]. She confirms pt is private pay. She requires assist with bathing only at baseline and self-administers medications. LCSW discussed need for daily weights with Shalita who reports understanding. Will add to FL2. Facility will need to assess pt prior to return. TOC will continue to follow.                Patient Goals and CMS Choice        Expected Discharge Plan and Services                                                Prior Living Arrangements/Services                       Activities of Daily Living Home Assistive Devices/Equipment: Environmental consultant (specify type), Wheelchair ADL Screening (condition at time of admission) Patient's cognitive ability adequate to safely complete daily activities?: Yes Is the patient deaf or have difficulty hearing?: No Does the patient have difficulty seeing, even when wearing glasses/contacts?: No Does the patient have difficulty concentrating, remembering, or making decisions?: No Patient able to express need for assistance with ADLs?: Yes Does the patient have difficulty dressing or bathing?: Yes Independently performs ADLs?:  No Communication: Independent Dressing (OT): Needs assistance Is this a change from baseline?: Pre-admission baseline Grooming: Needs assistance Is this a change from baseline?: Pre-admission baseline Feeding: Independent Bathing: Needs assistance Is this a change from baseline?: Pre-admission baseline Toileting: Needs assistance Is this a change from baseline?: Pre-admission baseline In/Out Bed: Needs assistance Is this a change from baseline?: Pre-admission baseline Walks in Home: Independent with device (comment) Does the patient have difficulty walking or climbing stairs?: Yes Weakness of Legs: Both Weakness of Arms/Hands: Both  Permission Sought/Granted                  Emotional Assessment              Admission diagnosis:  Hypokalemia [E87.6] Hypomagnesemia [E83.42] Heart failure ([place]) [I50.9] Chronic kidney disease, unspecified CKD stage [N18.9] Acute congestive heart failure, unspecified heart failure type ([place]) [I50.9] Patient Active Problem List   Diagnosis Date Noted   Heart failure ([place]) 04/08/2020   Obesity, Class III, BMI 40-49.9 (morbid obesity) ([place]) 04/08/2020   CKD stage 5 due to type 2 diabetes mellitus ([place]) 04/08/2020   Acquired lymphedema of lower extremity 04/08/2020   Type 2 diabetes mellitus with stage 4 chronic kidney disease, with long-term current use of insulin ([place]) 08/30/2017   Diabetes mellitus ([place]) 08/30/2017   Mixed hyperlipidemia 08/30/2017  Essential hypertension, benign 08/30/2017   Femoral artery hematoma complicating cardiac catheterization 09/15/2016   Status post coronary artery stent placement    Difficulty in walking, not elsewhere classified    Coronary artery disease involving native coronary artery of native heart with unstable angina pectoris ([place])    Coronary artery disease due to lipid rich plaque    Chronic anticoagulation    Acute systolic heart failure ([place])    Unstable angina ([place])     Type 2 diabetes mellitus with diabetic foot ulcer ([place]) 08/30/2016   Acute kidney injury ([place]) 08/30/2016   Dyspnea 08/30/2016   Peripheral edema    Persistent atrial fibrillation ([place])    Morbidly obese ([place])    PCP:  Rosalee Kaufman, PA-C Pharmacy:   [facility], [place] [address] [place] [address] Phone: [phone] Fax: [facility], [address] [place] [address] Phone: [phone] Fax: [phone]     Social Determinants of Health (SDOH) Interventions    Readmission Risk Interventions No flowsheet data found.

## 2020-04-10 ENCOUNTER — Telehealth: Payer: PPO | Admitting: Cardiology

## 2020-04-10 LAB — BASIC METABOLIC PANEL
Anion gap: 13 (ref 5–15)
BUN: 64 mg/dL — ABNORMAL HIGH (ref 8–23)
CO2: 27 mmol/L (ref 22–32)
Calcium: 8.8 mg/dL — ABNORMAL LOW (ref 8.9–10.3)
Chloride: 96 mmol/L — ABNORMAL LOW (ref 98–111)
Creatinine, Ser: 2.88 mg/dL — ABNORMAL HIGH (ref 0.44–1.00)
GFR calc Af Amer: 17 mL/min — ABNORMAL LOW (ref >60)
GFR calc non Af Amer: 15 mL/min — ABNORMAL LOW (ref >60)
Glucose, Bld: 209 mg/dL — ABNORMAL HIGH (ref 70–99)
Potassium: 3.3 mmol/L — ABNORMAL LOW (ref 3.5–5.1)
Sodium: 136 mmol/L (ref 135–145)

## 2020-04-10 LAB — GLUCOSE, CAPILLARY
Glucose-Capillary: 172 mg/dL — ABNORMAL HIGH (ref 70–99)
Glucose-Capillary: 212 mg/dL — ABNORMAL HIGH (ref 70–99)
Glucose-Capillary: 200 mg/dL — ABNORMAL HIGH (ref 70–99)

## 2020-04-10 NOTE — Progress Notes (Addendum)
PROGRESS NOTE    Kristina Howell  IOE:[id number] DOB: Jun 30, 1936 DOA: 04/08/2020 PCP: Rosalee Kaufman, PA-C    Chief Complaint  Patient presents with  . Shortness of Breath    Brief Narrative:  As per H&P written by Dr. Cathlean Sauer on 04/08/2020 Kristina Howell is a 84 y.o. female with medical history significant of atrial fibrillation, coronary artery disease, diastolic heart failure, stage IV chronic kidney disease, hypertension, obesity and type 2 diabetes mellitus. Patient reports worsening lower extremity edema for the last 4 months, over the last 10 days she has developed progressive and worsening dyspnea, to the point where she is symptomatic with minimal efforts like talking or going to the bathroom.  She has been sleeping in a recliner for long time, and she acknowledged orthopnea.    Dyspnea described as moderate to severe in intensity, worse with exertion, no frank improving factors, no associated chest pain or palpitations.  No wheezing or cough.  Positive weight gain 10 pounds over the last 7 days.  She has been seen as an outpatient by her cardiologist and her symptoms have been refractive to up titration of diuretic therapy with torsemide.   ED Course: Patient was found significantly hypervolemic, she has received 100 mg of intravenous furosemide and 40 mEq of potassium chloride along with 2 g of magnesium sulfate.  Referred for admission for evaluation.  Assessment & Plan: 1-acute respiratory failure with hypoxia in the setting of acute on chronic diastolic heart failure -Repeated echocardiogram: Demonstrating diastolic dysfunction with ejection fraction 60 to 65%.  See results below. (Previous results demonstrating combined CHF).  No wall motion abnormalities appreciated. -Continue low-sodium diet, strict I's and O's and daily weights -Cardiology service consultation appreciated; will follow recommendations and assistance. -Will continue IV diuresis and the use of  metolazone -Continue to closely monitoring of electrolytes and renal function -Continue to have shortness of breath, tachypnea with activity, wearing 2 L nasal cannula supplementation and expressing orthopnea symptoms. -Significant signs of fluid overload appreciated on exam. -Continue metoprolol -Patient has once again emphasized no hemodialysis intervention.  2-Persistent atrial fibrillation (HCC) -Continue metoprolol for rate control -Chronically on apixaban.  3-hyperlipidemia -Continue statins  4-Coronary artery disease due to lipid rich plaque -Continue aspirin, beta-blocker and statins -Patient denies chest pain.  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 -Follow CBG  6-Essential hypertension, benign -stable currently -will follow VS -continue current antihypertensive regimen.  7-Obesity, Class III, BMI 40-49.9 (morbid obesity) (HCC) -Body mass index is 47.14 kg/m. -low calorie and portion control discussed with patient  8-acute on CKD stage 5 due to CHF and further progression of renal disease -continue to closely follow renal function trend -continue current diuretic regimen -patient has declined HD -palliative care consulted   9-DNR -DNR/DNI -Ok to continue current therapy. -palliative care consulted.   DVT prophylaxis: Chronically on apixaban. Code Status: DNR/DNI Family Communication: Son over the phone (04/09/2020). Disposition:   Status is: Inpatient  Dispo: The patient is from: [name] (assisted living facility).              Anticipated d/c is to: To be determined              Anticipated d/c date is: To be determined.              Patient currently is not medically stable for discharge; still complaining of shortness of breath with increased amount of fluid overload/anasarca; continue using 2 L nasal cannula supplementation.  Has  expressed the wishes of not pursuing hemodialysis but okay to continue  aggressive IV diuresis and for short-term if require CPAP/BiPAP.  Patient now DNR/DNI after discussion with palliative care services.  Will follow cardiology recommendations.    Consultants:   Cardiology service  Palliative care   Procedures:  See below for x-ray report 2D echo:  1. Left ventricular ejection fraction, by estimation, is 60 to 65%. The  left ventricle has normal function. The left ventricle has no regional  wall motion abnormalities. There is mild left ventricular hypertrophy.  Left ventricular diastolic parameters  are indeterminate in the setting of atrial fibrillation.  2. Right ventricular systolic function is mildly to moderately reduced.  The right ventricular size is normal. Tricuspid regurgitation signal is  inadequate for assessing PA pressure.  3. Left atrial size was mildly dilated.  4. The mitral valve is abnormal. Mild mitral valve regurgitation.  Moderate mitral annular calcification.  5. The aortic valve is tricuspid. There is moderate calcification of the  aortic valve. Aortic valve regurgitation is not visualized. Aortic valve  mean gradient measures 12.3 mmHg.  6. The inferior vena cava is dilated in size with >50% respiratory  variability, suggesting right atrial pressure of 8 mmHg   Antimicrobials:  None   Subjective: Still with signs of fluid overload on examination; complaining of orthopnea, limited activity secondary to shortness of breath and tachypnea.  Wearing 2 L nasal cannula supplementation.  Reports good urine output.  Objective: Vitals:   04/09/20 0601 04/09/20 1408 04/09/20 2048 04/10/20 0605  BP: (!) 149/67 130/64 (!) 154/75 (!) 158/66  Pulse: 89 83 90 86  Resp: 20  18 20   Temp: 98 F (36.7 C) 97.8 F (36.6 C) 99 F (37.2 C) 98 F (36.7 C)  TempSrc:  Oral  Oral  SpO2: 95% 99% 95% 99%  Weight:      Height:        Intake/Output Summary (Last 24 hours) at 04/10/2020 1450 Last data filed at 04/10/2020 0333 Gross  per 24 hour  Intake 250 ml  Output 1050 ml  Net -800 ml   Filed Weights   04/08/20 1548 04/09/20 0500  Weight: 128.4 kg 128.5 kg    Examination: General exam: Alert, awake, oriented x 3; still reporting orthopnea, PND and shortness of breath with activity.  1 to 2 L nasal cannula supplementation.  No fever, no chest pain, reports no feelings of palpitations. Respiratory system: Fine crackles at the bases, positive rhonchi bilaterally; no wheezing, continue to be tachypnea and feeling short winded with minimal exertion.  2 L nasal cannula in place. Cardiovascular system: Irregular, no rubs, no gallops, unable to properly assess JVD due to body habitus. Gastrointestinal system: Abdomen is obese, nondistended, soft and nontender. No organomegaly or masses felt. Normal bowel sounds heard. Central nervous system: Alert and oriented. No focal neurological deficits. Extremities: No cyanosis or clubbing.;  2+ edema bilaterally Skin: No petechiae; positive chronic stasis dermatitis and lymphedema changes bilaterally. Psychiatry: Judgement and insight appear normal. Mood & affect appropriate.    Data Reviewed: I have personally reviewed following labs and imaging studies  CBC: Recent Labs  Lab 04/08/20 1631 04/09/20 0603  WBC 10.2 12.4*  HGB 8.0* 8.1*  HCT 28.1* 28.7*  MCV 94.9 95.0  PLT 361 626    Basic Metabolic Panel: Recent Labs  Lab 04/08/20 1631 04/09/20 0603  NA 140 140  K 2.9* 3.7  CL 100 102  CO2 24 23  GLUCOSE 185* 175*  BUN 60* 60*  CREATININE 2.96* 2.83*  CALCIUM 8.6* 9.2  MG 1.6* 2.1    GFR: Estimated Creatinine Clearance: 20.7 mL/min (A) (by C-G formula based on SCr of 2.83 mg/dL (H)).  Liver Function Tests: Recent Labs  Lab 04/08/20 1631  AST 36  ALT 37  ALKPHOS 153*  BILITOT 0.4  PROT 6.4*  ALBUMIN 2.0*    CBG: Recent Labs  Lab 04/09/20 1133 04/09/20 1620 04/09/20 2050 04/10/20 0727 04/10/20 1145  GLUCAP 188* 163* 193* 172* 212*      Recent Results (from the past 240 hour(s))  SARS Coronavirus 2 by RT PCR (hospital order, performed in [facility] hospital lab) Nasopharyngeal Nasopharyngeal Swab     Status: None   Collection Time: 04/08/20  4:03 PM   Specimen: Nasopharyngeal Swab  Result Value Ref Range Status   SARS Coronavirus 2 NEGATIVE NEGATIVE Final    Comment: (NOTE) SARS-CoV-2 target nucleic acids are NOT DETECTED.  The SARS-CoV-2 RNA is generally detectable in upper and lower respiratory specimens during the acute phase of infection. The lowest concentration of SARS-CoV-2 viral copies this assay can detect is 250 copies / mL. A negative result does not preclude SARS-CoV-2 infection and should not be used as the sole basis for treatment or other patient management decisions.  A negative result may occur with improper specimen collection / handling, submission of specimen other than nasopharyngeal swab, presence of viral mutation(s) within the areas targeted by this assay, and inadequate number of viral copies (<250 copies / mL). A negative result must be combined with clinical observations, patient history, and epidemiological information.  Fact Sheet for Patients:   StrictlyIdeas.no  Fact Sheet for Healthcare Providers: BankingDealers.co.za  This test is not yet approved or  cleared by the [place] FDA and has been authorized for detection and/or diagnosis of SARS-CoV-2 by FDA under an Emergency Use Authorization (EUA).  This EUA will remain in effect (meaning this test can be used) for the duration of the COVID-19 declaration under Section 564(b)(1) of the Act, 21 U.S.C. section 360bbb-3(b)(1), unless the authorization is terminated or revoked sooner.  Performed at [facility], [address], [place], [place] [address]      Radiology Studies: DG Chest Portable 1 View  Result Date: 04/08/2020 CLINICAL DATA:  Shortness of breath EXAM:  PORTABLE CHEST 1 VIEW COMPARISON:  August 29, 2016 FINDINGS: The cardiomediastinal silhouette is unchanged and enlarged in contour.Atherosclerotic calcifications of the aorta. Small bilateral pleural effusions. No pneumothorax. Diffuse interstitial prominence. Bibasilar homogeneous opacities likely atelectasis. Visualized abdomen is unremarkable. Multilevel degenerative changes of the thoracic spine. IMPRESSION: 1. Small bilateral pleural effusions with bibasilar atelectasis. 2. Diffuse interstitial prominence likely reflects pulmonary edema. Electronically Signed   By: Valentino Saxon MD   On: 04/08/2020 17:05   ECHOCARDIOGRAM COMPLETE  Result Date: 04/09/2020    ECHOCARDIOGRAM REPORT   Patient Name:   MAEVE Kristina Howell Date of Exam: 04/09/2020 Medical Rec #:  [id number]       Height:       65.0 in Accession #:    [id number]      Weight:       283.3 lb Date of Birth:  14-Sep-1935        BSA:          2.294 m Patient Age:    21 years        BP:           149/67 mmHg Patient Gender: F  HR:           87 bpm. Exam Location:  [facility] Procedure: 2D Echo, Cardiac Doppler and Color Doppler Indications:    Dyspnea 786.09 / R06.00  History:        Patient has prior history of Echocardiogram examinations, most                 recent 08/30/2016. Arrythmias:Atrial Fibrillation; Risk                 Factors:Hypertension and Diabetes.  Sonographer:    Leavy Cella RDCS (AE) Referring Phys: [id number] [place]  1. Left ventricular ejection fraction, by estimation, is 60 to 65%. The left ventricle has normal function. The left ventricle has no regional wall motion abnormalities. There is mild left ventricular hypertrophy. Left ventricular diastolic parameters are indeterminate in the setting of atrial fibrillation.  2. Right ventricular systolic function is mildly to moderately reduced. The right ventricular size is normal. Tricuspid regurgitation signal is inadequate for assessing PA  pressure.  3. Left atrial size was mildly dilated.  4. The mitral valve is abnormal. Mild mitral valve regurgitation. Moderate mitral annular calcification.  5. The aortic valve is tricuspid. There is moderate calcification of the aortic valve. Aortic valve regurgitation is not visualized. Aortic valve mean gradient measures 12.3 mmHg.  6. The inferior vena cava is dilated in size with >50% respiratory variability, suggesting right atrial pressure of 8 mmHg. FINDINGS  Left Ventricle: Left ventricular ejection fraction, by estimation, is 60 to 65%. The left ventricle has normal function. The left ventricle has no regional wall motion abnormalities. The left ventricular internal cavity size was normal in size. There is  mild left ventricular hypertrophy. Left ventricular diastolic parameters are indeterminate. Right Ventricle: The right ventricular size is normal. No increase in right ventricular wall thickness. Right ventricular systolic function is moderately reduced. Tricuspid regurgitation signal is inadequate for assessing PA pressure. Left Atrium: Left atrial size was mildly dilated. Right Atrium: Right atrial size was normal in size. Pericardium: There is no evidence of pericardial effusion. Mitral Valve: The mitral valve is abnormal. Moderate mitral annular calcification. Mild mitral valve regurgitation. Tricuspid Valve: The tricuspid valve is grossly normal. Tricuspid valve regurgitation is mild. Aortic Valve: The aortic valve is tricuspid. There is moderate calcification of the aortic valve. There is mild to moderate aortic valve annular calcification. Aortic valve regurgitation is not visualized. Aortic valve mean gradient measures 12.3 mmHg. Aortic valve peak gradient measures 19.7 mmHg. Aortic valve area, by VTI measures 0.75 cm. Pulmonic Valve: The pulmonic valve was grossly normal. Pulmonic valve regurgitation is trivial. Aorta: The aortic root is normal in size and structure. Venous: The inferior vena  cava is dilated in size with greater than 50% respiratory variability, suggesting right atrial pressure of 8 mmHg. IAS/Shunts: No atrial level shunt detected by color flow Doppler. Additional Comments: There is pleural effusion in the left lateral region.  LEFT VENTRICLE PLAX 2D LVIDd:         4.16 cm  Diastology LVIDs:         2.60 cm  LV e' medial:    8.38 cm/s LV PW:         1.28 cm  LV E/e' medial:  16.0 LV IVS:        1.15 cm  LV e' lateral:   7.51 cm/s LVOT diam:     1.80 cm  LV E/e' lateral: 17.8 LV SV:  38 LV SV Index:   17 LVOT Area:     2.54 cm  RIGHT VENTRICLE RV S prime:     9.57 cm/s LEFT ATRIUM              Index       RIGHT ATRIUM           Index LA diam:        5.10 cm  2.22 cm/m  RA Area:     17.30 cm LA Vol (A2C):   111.0 ml 48.40 ml/m RA Volume:   49.70 ml  21.67 ml/m LA Vol (A4C):   55.0 ml  23.98 ml/m LA Biplane Vol: 79.0 ml  34.44 ml/m  AORTIC VALVE AV Area (Vmax):    0.77 cm AV Area (Vmean):   0.70 cm AV Area (VTI):     0.75 cm AV Vmax:           222.00 cm/s AV Vmean:          167.667 cm/s AV VTI:            0.507 m AV Peak Grad:      19.7 mmHg AV Mean Grad:      12.3 mmHg LVOT Vmax:         67.23 cm/s LVOT Vmean:        46.400 cm/s LVOT VTI:          0.149 m LVOT/AV VTI ratio: 0.29  AORTA Ao Root diam: 2.30 cm MITRAL VALVE MV Area (PHT): 3.75 cm     SHUNTS MV Decel Time: 202 msec     Systemic VTI:  0.15 m MR Peak grad: 109.4 mmHg    Systemic Diam: 1.80 cm MR Mean grad: 78.0 mmHg MR Vmax:      523.00 cm/s MR Vmean:     421.0 cm/s MV E velocity: 134.00 cm/s Rozann Lesches MD Electronically signed by Rozann Lesches MD Signature Date/Time: 04/09/2020/12:59:54 PM    Final     Scheduled Meds: . allopurinol  300 mg Oral Daily  . apixaban  2.5 mg Oral BID  . aspirin EC  81 mg Oral Daily  . cholecalciferol  2,000 Units Oral Daily  . furosemide  60 mg Intravenous Q8H  . insulin aspart  0-6 Units Subcutaneous TID WC  . metolazone  2.5 mg Oral Daily  . metoprolol succinate   50 mg Oral QHS  . rosuvastatin  20 mg Oral Daily   Continuous Infusions:   LOS: 2 days    Time spent: 35 minutes   Barton Dubois, MD [facility]   To contact the attending provider between 7A-7P or the covering provider during after hours 7P-7A, please log into the web site [email or web address] and access using universal [place] password for that web site. If you do not have the password, please call the hospital operator.  04/10/2020, 2:50 PM

## 2020-04-10 NOTE — Plan of Care (Signed)

## 2020-04-10 NOTE — Progress Notes (Addendum)
Progress Note  Patient Name: Kristina Howell Date of Encounter: 04/10/2020  Primary Cardiologist: Rozann Lesches, MD   Subjective   Breathing improved but still having dyspnea with minimal activity. Orthopnea and PND. No chest pain or palpitations.   Inpatient Medications    Scheduled Meds: . allopurinol  300 mg Oral Daily  . apixaban  2.5 mg Oral BID  . aspirin EC  81 mg Oral Daily  . cholecalciferol  2,000 Units Oral Daily  . furosemide  60 mg Intravenous Q8H  . insulin aspart  0-6 Units Subcutaneous TID WC  . metolazone  2.5 mg Oral Daily  . metoprolol succinate  50 mg Oral QHS  . rosuvastatin  20 mg Oral Daily   Continuous Infusions:  PRN Meds: acetaminophen **OR** acetaminophen, ondansetron **OR** ondansetron (ZOFRAN) IV, traMADol   Vital Signs    Vitals:   04/09/20 0601 04/09/20 1408 04/09/20 2048 04/10/20 0605  BP: (!) 149/67 130/64 (!) 154/75 (!) 158/66  Pulse: 89 83 90 86  Resp: 20  18 20   Temp: 98 F (36.7 C) 97.8 F (36.6 C) 99 F (37.2 C) 98 F (36.7 C)  TempSrc:  Oral  Oral  SpO2: 95% 99% 95% 99%  Weight:      Height:        Intake/Output Summary (Last 24 hours) at 04/10/2020 1109 Last data filed at 04/10/2020 0333 Gross per 24 hour  Intake 250 ml  Output 1050 ml  Net -800 ml    Last 3 Weights 04/09/2020 04/08/2020 04/07/2020  Weight (lbs) 283 lb 4.7 oz 283 lb 283 lb  Weight (kg) 128.5 kg 128.368 kg 128.368 kg      Telemetry    Atrial fibrillation, HR in 70's to 80's. Occasional PVC's.  - Personally Reviewed  ECG    No new tracings.  Physical Exam   General: Elderly obese female female appearing in no acute distress. Head: Normocephalic, atraumatic.  Neck: Supple without bruits, JVD elevated. Lungs:  Resp regular and unlabored, rales along bases with scattered crackles. Heart: Irregularly irregular, S1, S2, no S3, S4, or murmur; no rub. Abdomen: Soft, non-tender, non-distended with normoactive bowel sounds. No hepatomegaly. No  rebound/guarding. No obvious abdominal masses. Extremities: No clubbing. 2+ pitting edema up to thighs bilaterally. Distal pedal pulses are 2+ bilaterally. Neuro: Alert and oriented X 3. Moves all extremities spontaneously. Psych: Normal affect.  Labs    Chemistry Recent Labs  Lab 04/08/20 1631 04/09/20 0603  NA 140 140  K 2.9* 3.7  CL 100 102  CO2 24 23  GLUCOSE 185* 175*  BUN 60* 60*  CREATININE 2.96* 2.83*  CALCIUM 8.6* 9.2  PROT 6.4*  --   ALBUMIN 2.0*  --   AST 36  --   ALT 37  --   ALKPHOS 153*  --   BILITOT 0.4  --   GFRNONAA 14* 15*  GFRAA 16* 17*  ANIONGAP 16* 15     Hematology Recent Labs  Lab 04/08/20 1631 04/09/20 0603  WBC 10.2 12.4*  RBC 2.96* 3.02*  HGB 8.0* 8.1*  HCT 28.1* 28.7*  MCV 94.9 95.0  MCH 27.0 26.8  MCHC 28.5* 28.2*  RDW 20.8* 20.7*  PLT 361 396    Cardiac EnzymesNo results for input(s): TROPONINI in the last 168 hours. No results for input(s): TROPIPOC in the last 168 hours.   BNP Recent Labs  Lab 04/08/20 1631  BNP 468.0*     DDimer No results for input(s): DDIMER in the last  168 hours.   Radiology    DG Chest Portable 1 View  Result Date: 04/08/2020 CLINICAL DATA:  Shortness of breath EXAM: PORTABLE CHEST 1 VIEW COMPARISON:  August 29, 2016 FINDINGS: The cardiomediastinal silhouette is unchanged and enlarged in contour.Atherosclerotic calcifications of the aorta. Small bilateral pleural effusions. No pneumothorax. Diffuse interstitial prominence. Bibasilar homogeneous opacities likely atelectasis. Visualized abdomen is unremarkable. Multilevel degenerative changes of the thoracic spine. IMPRESSION: 1. Small bilateral pleural effusions with bibasilar atelectasis. 2. Diffuse interstitial prominence likely reflects pulmonary edema. Electronically Signed   By: Valentino Saxon MD   On: 04/08/2020 17:05    Cardiac Studies   Echocardiogram: 04/09/2020 IMPRESSIONS    1. Left ventricular ejection fraction, by estimation,  is 60 to 65%. The  left ventricle has normal function. The left ventricle has no regional  wall motion abnormalities. There is mild left ventricular hypertrophy.  Left ventricular diastolic parameters  are indeterminate in the setting of atrial fibrillation.  2. Right ventricular systolic function is mildly to moderately reduced.  The right ventricular size is normal. Tricuspid regurgitation signal is  inadequate for assessing PA pressure.  3. Left atrial size was mildly dilated.  4. The mitral valve is abnormal. Mild mitral valve regurgitation.  Moderate mitral annular calcification.  5. The aortic valve is tricuspid. There is moderate calcification of the  aortic valve. Aortic valve regurgitation is not visualized. Aortic valve  mean gradient measures 12.3 mmHg.  6. The inferior vena cava is dilated in size with >50% respiratory  variability, suggesting right atrial pressure of 8 mmHg.   Patient Profile     84 y.o. female w/ PMH of CAD (s/p DES to LAD and DES to RCA in 2018), chronic diastolic CHF, permanent atrial fibrillation, HTN, HLD, Type 2 DM and Stage 4-5 CKD who is currrntly admitted for an acute CHF exacerbation.   Assessment & Plan    1. Acute on Chronic Diastolic CHF Exacerbation - She presented with worsening dyspnea and weight up 10-15 + lbs over her baseline. Was on Torsemide 100mg  BID as an outpatient but symptoms continued to worsen.  - BNP was elevated to 468 on admission and CXR showed small bilateral pleural effusions.  - Currently receicing IV Lasix 60mg  Q8H and Metolazone 2.5mg  daily with a recorded output of -2.2 L thus far. Weight not yet recorded for today but was at 283 lbs on admission. Creatinine was at 2.96 on admission, at 2.83 yesterday. Will recheck BMET today for reassessment of renal function.   2. Permanent Atrial Fibrillation - Rates have overall been well controlled by review of telemetry. Continue Toprol-XL 50 mg daily for rate control.   -  She denies any evidence of active bleeding. Remains on Eliquis 2.5 mg twice daily for anticoagulation (reduced dosing given age and renal function).   3. CAD - She is s/p DES to LAD and DES to RCA in 2018.  She does have baseline dyspnea on exertion but denies any recent chest pain. Continue ASA, beta-blocker and statin therapy.  4. HTN - BP has been variable at 130/64 - 158/66 within the past 24 hours. Continue Toprol-XL.   5. Stage 4-5 CKD - Followed by Dr. Theador Hawthorne as an outpatient. At 2.96 on admission, at 2.83 on 9/23. Repeat BMET pending for today.    For questions or updates, please contact [place] Please consult [email or web address] for contact info under Cardiology/STEMI.   Arna Medici , PA-C 11:09 AM 04/10/2020 Pager: [phone]  Patient seen and  discussed with PA Strader, I agree with her documentation. Admitted with acute on chronic diastolic HF, 16-58 lbs above her baseline. Neg 1.5 L yesterday, she is on IV lasix 60mg  tid, metolazone 2.5mg  daily. Diuresis is affacted by CKD IV, labs pending today. Echo this admit shows LVEF 60-65%, indet DDx, mild to mod RV dysfunction   Continue IV diuresis today, follow up labs. If stable may titrate diuretic regimen further tomorrow pending output and labs.    Carlyle Dolly MD

## 2020-04-10 NOTE — Progress Notes (Signed)
Daily Progress Note   Patient Name: Kristina Howell       Date: 04/10/2020 DOB: [birth year]  Age: 84 y.o. MRN#: [id number] Attending Physician: Barton Dubois, MD Primary Care Physician: Rosine Door Admit Date: 04/08/2020  Reason for Consultation/Follow-up: Establishing goals of care  Subjective: Patient awake, alert, oriented. Reports some improvement in breathing today. Tolerating meals. Denies pain.   GOC:  Son, Aaron Edelman at bedside. Copy of patient's HCPOA/living will placed in chart. Reviewed MOST form that was completed yesterday. Discussed plan of care including diagnoses and interventions. Discussed watchful waiting through the weekend with diuresis. Answered questions.  This AM, did speak with son Aaron Edelman via telephone. Answered questions about care plan. Discussed hope for further diuresis and medical optimization but also risk for recurrent decline/admission secondary to CKD and heart failure. Answered questions regarding difference with palliative versus hospice options outpatient. Patient was unable to do intense physical therapy prior to admission and worsening fluid overload. She likely will not tolerate intense PT after this hospitalization. Discussed watchful waiting through the weekend and time for outcomes from diuresis. Reassured of ongoing support from PMT next week and further hospice discussions if necessary. Aaron Edelman appreciative of information.   Length of Stay: 2  Current Medications: Scheduled Meds:  . allopurinol  300 mg Oral Daily  . apixaban  2.5 mg Oral BID  . aspirin EC  81 mg Oral Daily  . cholecalciferol  2,000 Units Oral Daily  . furosemide  60 mg Intravenous Q8H  . insulin aspart  0-6 Units Subcutaneous TID WC  . metolazone  2.5 mg Oral Daily  .  metoprolol succinate  50 mg Oral QHS  . rosuvastatin  20 mg Oral Daily    Continuous Infusions:   PRN Meds: acetaminophen **OR** acetaminophen, ondansetron **OR** ondansetron (ZOFRAN) IV, traMADol  Physical Exam Vitals and nursing note reviewed.  Constitutional:      General: She is awake.  Cardiovascular:     Rate and Rhythm: Rhythm irregularly irregular.  Pulmonary:     Effort: No tachypnea, accessory muscle usage or respiratory distress.     Comments: Intermittent dyspnea at rest, improved today Skin:    General: Skin is warm and dry.  Neurological:     Mental Status: She is alert and oriented to person, place, and time.  Psychiatric:  Mood and Affect: Mood normal.        Speech: Speech normal.        Behavior: Behavior normal.        Cognition and Memory: Cognition normal.             Vital Signs: BP (!) 158/66 (BP Location: Left Arm)   Pulse 86   Temp 98 F (36.7 C) (Oral)   Resp 20   Ht 5\' 5"  (1.651 m)   Wt 128.5 kg   SpO2 99%   BMI 47.14 kg/m  SpO2: SpO2: 99 % O2 Device: O2 Device: Nasal Cannula O2 Flow Rate: O2 Flow Rate (L/min): 3.5 L/min  Intake/output summary:   Intake/Output Summary (Last 24 hours) at 04/10/2020 1229 Last data filed at 04/10/2020 6734 Gross per 24 hour  Intake 250 ml  Output 1050 ml  Net -800 ml   LBM: Last BM Date: 04/09/20 Baseline Weight: Weight: 128.4 kg Most recent weight: Weight: 128.5 kg       Palliative Assessment/Data: PPS 50%    Flowsheet Rows     Most Recent Value  Intake Tab  Referral Department Hospitalist  Unit at Time of Referral Med/Surg Unit  Palliative Care Primary Diagnosis Cardiac  Palliative Care Type New Palliative care  Reason for referral Clarify Goals of Care  Date first seen by Palliative Care 04/09/20  Clinical Assessment  Palliative Performance Scale Score 50%  Psychosocial & Spiritual Assessment  Palliative Care Outcomes  Patient/Family meeting held? Yes  Who was at the meeting?  patient and son (brian)  Palliative Care Outcomes Clarified goals of care, Provided end of life care assistance, Provided advance care planning, Provided psychosocial or spiritual support, Changed CPR status, Completed durable DNR, ACP counseling assistance      Patient Active Problem List   Diagnosis Date Noted  . Chronic kidney disease   . Palliative care by specialist   . Goals of care, counseling/discussion   . DNR (do not resuscitate)   . Heart failure ([place]) 04/08/2020  . Obesity, Class III, BMI 40-49.9 (morbid obesity) ([place]) 04/08/2020  . CKD stage 5 due to type 2 diabetes mellitus ([place]) 04/08/2020  . Acquired lymphedema of lower extremity 04/08/2020  . Type 2 diabetes mellitus with stage 4 chronic kidney disease, with long-term current use of insulin ([place]) 08/30/2017  . Diabetes mellitus ([place]) 08/30/2017  . Mixed hyperlipidemia 08/30/2017  . Essential hypertension, benign 08/30/2017  . Femoral artery hematoma complicating cardiac catheterization 09/15/2016  . Status post coronary artery stent placement   . Difficulty in walking, not elsewhere classified   . Coronary artery disease involving native coronary artery of native heart with unstable angina pectoris ([place])   . Coronary artery disease due to lipid rich plaque   . Chronic anticoagulation   . Acute systolic heart failure ([place])   . Unstable angina ([place])   . Type 2 diabetes mellitus with diabetic foot ulcer ([place]) 08/30/2016  . Acute kidney injury ([place]) 08/30/2016  . Dyspnea 08/30/2016  . Peripheral edema   . Persistent atrial fibrillation ([place])   . Morbidly obese [facility])     Palliative Care Assessment & Plan   Patient Profile: 84 y.o. female  with past medical history of atrial fibrillation, CAD, diastolic heart failure, stage IV CKD, chronic lymphedema, hypertension, obesity, type 2 DM admitted on 04/08/2020 with shortness of breath and BLE swelling. Hospital admission for acute decompensation of diastolic heart failure,  pulmonary edema, chronic and persistent atrial fibrillation, and AKI with underlying  CKD. Pending cardiology consultation. Receiving aggressive IV diuresis. Palliative medicine consultation for goals of care.   Assessment: Acute respiratory failure with hypoxia Acute on chronic combined heart failure Volume overload Persistent atrial fibrillation Acute on CKD stage 5 CAD Chronic lymphedema  Recommendations/Plan:  Patient clear on her wishes for DNR/DNI code status. NO dialysis.   Patient's HCPOA/living will copy placed in chart. Son, Aaron Edelman at bedside during [place] discussion and understands/respects his mother's wishes for DNR/DNI code status.   Continue current plan of care and medical management. Watchful waiting and time for outcomes.   Completed MOST form 04/09/20. Decisions include: DNR/DNI, limited additional interventions including CPAP/BiPAP if indicated, IVF for time trial, ABX if indicated, NO feeding tube, NO dialysis. Electronic [employer] MOST form and durable DNR completed.   May benefit from outpatient palliative referral. If lack of improvement inpatient, may need further hospice discussions with patient/family. Likely will not tolerate intense physical therapy following hospitalization as functional status and therapy was limited prior to admission.   Code Status: DNR/DNI   Code Status Orders  (From admission, onward)         Start     Ordered   04/09/20 1506  Do not attempt resuscitation (DNR)  Continuous       Question Answer Comment  In the event of cardiac or respiratory ARREST Do not call a "code blue"   In the event of cardiac or respiratory ARREST Do not perform Intubation, CPR, defibrillation or ACLS   In the event of cardiac or respiratory ARREST Use medication by any route, position, wound care, and other measures to relive pain and suffering. May use oxygen, suction and manual treatment of airway obstruction as needed for comfort.      04/09/20 1505          Code Status History    Date Active Date Inactive Code Status Order ID Comments User Context   04/08/2020 2130 04/09/2020 1505 Full Code [id number]  Tawni Millers, MD Inpatient   08/29/2016 2313 09/16/2016 1839 Full Code [id number]  Rogue Bussing, MD ED   Advance Care Planning Activity       Prognosis:  Poor long-term prognosis  Discharge Planning:  To Be Determined  Care plan was discussed with RN, Dr. Dyann Kief, son Aaron Edelman), patient  Thank you for allowing the Palliative Medicine Team to assist in the care of this patient.   Total Time 30 Prolonged Time Billed  no      Greater than 50%  of this time was spent counseling and coordinating care related to the above assessment and plan.  Ihor Dow, DNP, FNP-C Palliative Medicine Team  Phone: [phone] Fax: [phone]  Please contact Palliative Medicine Team phone at [phone] for questions and concerns.

## 2020-04-11 LAB — BASIC METABOLIC PANEL
Anion gap: 13 (ref 5–15)
BUN: 68 mg/dL — ABNORMAL HIGH (ref 8–23)
CO2: 27 mmol/L (ref 22–32)
Calcium: 8.7 mg/dL — ABNORMAL LOW (ref 8.9–10.3)
Chloride: 96 mmol/L — ABNORMAL LOW (ref 98–111)
Creatinine, Ser: 2.82 mg/dL — ABNORMAL HIGH (ref 0.44–1.00)
GFR calc Af Amer: 17 mL/min — ABNORMAL LOW (ref >60)
GFR calc non Af Amer: 15 mL/min — ABNORMAL LOW (ref >60)
Glucose, Bld: 197 mg/dL — ABNORMAL HIGH (ref 70–99)
Potassium: 2.8 mmol/L — ABNORMAL LOW (ref 3.5–5.1)
Sodium: 136 mmol/L (ref 135–145)

## 2020-04-11 LAB — GLUCOSE, CAPILLARY
Glucose-Capillary: 171 mg/dL — ABNORMAL HIGH (ref 70–99)
Glucose-Capillary: 235 mg/dL — ABNORMAL HIGH (ref 70–99)
Glucose-Capillary: 245 mg/dL — ABNORMAL HIGH (ref 70–99)
Glucose-Capillary: 223 mg/dL — ABNORMAL HIGH (ref 70–99)

## 2020-04-11 NOTE — Plan of Care (Signed)

## 2020-04-11 NOTE — TOC Progression Note (Signed)
Transition of Care [facility]) - Progression Note    Patient Details  Name: Kristina Howell MRN: [id number] Date of Birth: Aug 09, 1935  Transition of Care [facility]) CM/SW Contact  Salome Arnt, [place] Phone Number: 04/11/2020, 2:09 PM  Clinical Narrative:  LCSW updated Shalita at [facility] on pt.  Shalita requested that LCSW discuss facility taking over administering medications. LCSW talked to pt, but she states that due to cost, she would prefer to continue self-administering. Shalita updated and will discuss further with pt/family. TOC to continue to follow.     Expected Discharge Plan: Assisted Living Barriers to Discharge: Continued Medical Work up  Expected Discharge Plan and Services Expected Discharge Plan: Assisted Living In-house Referral: Clinical Social Work     Living arrangements for the past 2 months: [place]                                       Social Determinants of Health (SDOH) Interventions    Readmission Risk Interventions No flowsheet data found.

## 2020-04-11 NOTE — Care Management Important Message (Signed)
Important Message  Patient Details  Name: Kristina Howell MRN: [id number] Date of Birth: 10-14-1935   Medicare Important Message Given:  Yes     Tommy Medal 04/11/2020, 3:29 PM

## 2020-04-11 NOTE — Progress Notes (Addendum)
PROGRESS NOTE    Kristina Howell  RJJ:[id number] DOB: 01-03-1936 DOA: 04/08/2020 PCP: Rosalee Kaufman, PA-C    Chief Complaint  Patient presents with  . Shortness of Breath    Brief Narrative:  As per H&P written by Dr. Cathlean Sauer on 04/08/2020 Kristina Howell is a 84 y.o. female with medical history significant of atrial fibrillation, coronary artery disease, diastolic heart failure, stage IV chronic kidney disease, hypertension, obesity and type 2 diabetes mellitus. Patient reports worsening lower extremity edema for the last 4 months, over the last 10 days she has developed progressive and worsening dyspnea, to the point where she is symptomatic with minimal efforts like talking or going to the bathroom.  She has been sleeping in a recliner for long time, and she acknowledged orthopnea.    Dyspnea described as moderate to severe in intensity, worse with exertion, no frank improving factors, no associated chest pain or palpitations.  No wheezing or cough.  Positive weight gain 10 pounds over the last 7 days.  She has been seen as an outpatient by her cardiologist and her symptoms have been refractive to up titration of diuretic therapy with torsemide.   ED Course: Patient was found significantly hypervolemic, she has received 100 mg of intravenous furosemide and 40 mEq of potassium chloride along with 2 g of magnesium sulfate.  Referred for admission for evaluation.  Assessment & Plan: 1-acute respiratory failure with hypoxia in the setting of acute on chronic diastolic heart failure -Repeated echocardiogram: Demonstrating diastolic dysfunction with ejection fraction 60 to 65%.  See results below. (Previous results demonstrating combined CHF).  No wall motion abnormalities appreciated. -Continue low-sodium diet, strict I's and O's and daily weights -Cardiology service consultation appreciated; will follow recommendations and assistance. -Will continue IV diuresis and the use of  metolazone -Continue to closely monitoring of electrolytes and renal function -Continue to have shortness of breath, tachypnea with activity, wearing 2 L nasal cannula supplementation and expressing orthopnea symptoms. -Significant signs of fluid overload appreciated on exam. -Continue metoprolol -Patient has once again emphasized no hemodialysis intervention. -Pure wick came off so urine output is inaccurate  2-Persistent atrial fibrillation (HCC) -Continue metoprolol for rate control -Chronically on apixaban.  3-hyperlipidemia -Continue statins  4-Coronary artery disease due to lipid rich plaque -Continue aspirin, beta-blocker and statins -Patient denies chest pain.  5-type II diabetes mellitus ([place]) with nephropathy -Continue sliding scale insulin. -Follow CBG  6-Essential hypertension, benign -stable currently -will follow VS -continue current antihypertensive regimen.  7-Obesity, Class III, BMI 40-49.9 (morbid obesity) (HCC) -Body mass index is 47.14 kg/m. -low calorie and portion control discussed with patient  8-acute on CKD stage 5 due to CHF and further progression of renal disease -continue to closely follow renal function trend -continue current diuretic regimen -patient has declined HD -palliative care consulted   9-DNR -DNR/DNI -Ok to continue current therapy. -palliative care consulted.   10)Disposition--- ALF facility is unable to take patient back until Monday, 04/14/2020 due to staffing issues, however patient remains medically Not ready for discharge at this time  DVT prophylaxis: Chronically on apixaban. Code Status: DNR/DNI Family Communication: Son over the phone (04/09/2020). Disposition:   Status is: Inpatient  Dispo: The [facility] is from: [name] ([facility] living facility).              Anticipated d/c is to: ALF with [facility] PT              Anticipated d/c date is: ALF Facility is Unable to take her  back until Monday 04/14/20 due to staffing  issues--    - Patient currently is not medically stable for discharge; still complaining of shortness of breath with increased amount of fluid overload/anasarca; continue using 2 L nasal cannula supplementation.  Has expressed the wishes of not pursuing hemodialysis but okay to continue aggressive IV diuresis and for short-term if require CPAP/BiPAP.  Patient now DNR/DNI after discussion with palliative care services.      Consultants:   Cardiology service  Palliative care   Procedures:  See below for x-ray report 2D echo:  1. Left ventricular ejection fraction, by estimation, is 60 to 65%. The  left ventricle has normal function. The left ventricle has no regional  wall motion abnormalities. There is mild left ventricular hypertrophy.  Left ventricular diastolic parameters  are indeterminate in the setting of atrial fibrillation.  2. Right ventricular systolic function is mildly to moderately reduced.  The right ventricular size is normal. Tricuspid regurgitation signal is  inadequate for assessing PA pressure.  3. Left atrial size was mildly dilated.  4. The mitral valve is abnormal. Mild mitral valve regurgitation.  Moderate mitral annular calcification.  5. The aortic valve is tricuspid. There is moderate calcification of the  aortic valve. Aortic valve regurgitation is not visualized. Aortic valve  mean gradient measures 12.3 mmHg.  6. The inferior vena cava is dilated in size with >50% respiratory  variability, suggesting right atrial pressure of 8 mmHg   Antimicrobials:  None   Subjective: -Voiding okay -Pure wick came off so urine output is inaccurate  -Dyspnea on exertion persist  Objective: Vitals:   04/10/20 2011 04/10/20 2027 04/11/20 0417 04/11/20 1320  BP:  (!) 161/73 (!) 159/42 (!) 135/47  Pulse:  87 84 71  Resp:  18 16 16   Temp:  98.1 F (36.7 C) 97.9 F (36.6 C) 97.7 F (36.5 C)  TempSrc:  Oral Oral Oral  SpO2: 92% 98% 99% (!) 89%  Weight:       Height:        Intake/Output Summary (Last 24 hours) at 04/11/2020 1407 Last data filed at 04/11/2020 0900 Gross per 24 hour  Intake --  Output 3350 ml  Net -3350 ml   Filed Weights   04/08/20 1548 04/09/20 0500  Weight: 128.4 kg 128.5 kg    Examination: General exam: Alert, awake, oriented x 3; still reporting orthopnea, PND and shortness of breath with activity.   2 L nasal cannula supplementation.  Respiratory system: Fine crackles at the bases, positive rhonchi bilaterally; no wheezing, continue to be tachypnea and feeling short winded with minimal exertion.  2 L nasal cannula in place. Cardiovascular system: Irregular, no rubs, no gallops, unable to properly assess JVD due to body habitus. Gastrointestinal system: Abdomen is obese, nondistended, soft and nontender. No organomegaly or masses felt. Normal bowel sounds heard. Central nervous system: Alert and oriented. No focal neurological deficits. Extremities: No cyanosis or clubbing.;  2+ edema bilaterally, chronic venous stasis type dermatitis changes Skin: No petechiae; positive chronic stasis dermatitis and lymphedema changes bilaterally. Psychiatry: Judgement and insight appear normal. Mood & affect appropriate.    Data Reviewed: I have personally reviewed following labs and imaging studies  CBC: Recent Labs  Lab 04/08/20 1631 04/09/20 0603  WBC 10.2 12.4*  HGB 8.0* 8.1*  HCT 28.1* 28.7*  MCV 94.9 95.0  PLT 361 299    Basic Metabolic Panel: Recent Labs  Lab 04/08/20 1631 04/09/20 0603 04/10/20 1608 04/11/20 0732  NA 140  140 136 136  K 2.9* 3.7 3.3* 2.8*  CL 100 102 96* 96*  CO2 24 23 27 27   GLUCOSE 185* 175* 209* 197*  BUN 60* 60* 64* 68*  CREATININE 2.96* 2.83* 2.88* 2.82*  CALCIUM 8.6* 9.2 8.8* 8.7*  MG 1.6* 2.1  --   --     GFR: Estimated Creatinine Clearance: 20.8 mL/min (A) (by C-G formula based on SCr of 2.82 mg/dL (H)).  Liver Function Tests: Recent Labs  Lab 04/08/20 1631  AST 36   ALT 37  ALKPHOS 153*  BILITOT 0.4  PROT 6.4*  ALBUMIN 2.0*    CBG: Recent Labs  Lab 04/10/20 0727 04/10/20 1145 04/10/20 1614 04/11/20 0745 04/11/20 1127  GLUCAP 172* 212* 200* 171* 235*     Recent Results (from the past 240 hour(s))  SARS Coronavirus 2 by RT PCR (hospital order, performed in [facility] hospital lab) Nasopharyngeal Nasopharyngeal Swab     Status: None   Collection Time: 04/08/20  4:03 PM   Specimen: Nasopharyngeal Swab  Result Value Ref Range Status   SARS Coronavirus 2 NEGATIVE NEGATIVE Final    Comment: (NOTE) SARS-CoV-2 target nucleic acids are NOT DETECTED.  The SARS-CoV-2 RNA is generally detectable in upper and lower respiratory specimens during the acute phase of infection. The lowest concentration of SARS-CoV-2 viral copies this assay can detect is 250 copies / mL. A negative result does not preclude SARS-CoV-2 infection and should not be used as the sole basis for treatment or other patient management decisions.  A negative result may occur with improper specimen collection / handling, submission of specimen other than nasopharyngeal swab, presence of viral mutation(s) within the areas targeted by this assay, and inadequate number of viral copies (<250 copies / mL). A negative result must be combined with clinical observations, patient history, and epidemiological information.  Fact Sheet for Patients:   StrictlyIdeas.no  Fact Sheet for Healthcare Providers: BankingDealers.co.za  This test is not yet approved or  cleared by the [place] FDA and has been authorized for detection and/or diagnosis of SARS-CoV-2 by FDA under an Emergency Use Authorization (EUA).  This EUA will remain in effect (meaning this test can be used) for the duration of the COVID-19 declaration under Section 564(b)(1) of the Act, 21 U.S.C. section 360bbb-3(b)(1), unless the authorization is terminated or revoked  sooner.  Performed at [facility], [address] [place], [place] [address]      Radiology Studies: No results found.  Scheduled Meds: . allopurinol  300 mg Oral Daily  . apixaban  2.5 mg Oral BID  . aspirin EC  81 mg Oral Daily  . cholecalciferol  2,000 Units Oral Daily  . furosemide  60 mg Intravenous Q8H  . insulin aspart  0-6 Units Subcutaneous TID WC  . metolazone  2.5 mg Oral Daily  . metoprolol succinate  50 mg Oral QHS  . rosuvastatin  20 mg Oral Daily   Continuous Infusions:   LOS: 3 days   Roxan Hockey, MD [facility]   04/11/2020, 2:07 PM

## 2020-04-11 NOTE — Progress Notes (Signed)
Progress Note  Patient Name: Kristina Howell Date of Encounter: 04/11/2020  [facility] Cardiologist: Rozann Lesches, MD   Subjective   Some improvement in SOB  Inpatient Medications    Scheduled Meds: . allopurinol  300 mg Oral Daily  . apixaban  2.5 mg Oral BID  . aspirin EC  81 mg Oral Daily  . cholecalciferol  2,000 Units Oral Daily  . furosemide  60 mg Intravenous Q8H  . insulin aspart  0-6 Units Subcutaneous TID WC  . metolazone  2.5 mg Oral Daily  . metoprolol succinate  50 mg Oral QHS  . rosuvastatin  20 mg Oral Daily   Continuous Infusions:  PRN Meds: acetaminophen **OR** acetaminophen, ondansetron **OR** ondansetron (ZOFRAN) IV, traMADol   Vital Signs    Vitals:   04/10/20 1615 04/10/20 2011 04/10/20 2027 04/11/20 0417  BP: (!) 158/64  (!) 161/73 (!) 159/42  Pulse: 83  87 84  Resp: 18  18 16   Temp: 97.9 F (36.6 C)  98.1 F (36.7 C) 97.9 F (36.6 C)  TempSrc: Oral  Oral Oral  SpO2: 100% 92% 98% 99%  Weight:      Height:        Intake/Output Summary (Last 24 hours) at 04/11/2020 0820 Last data filed at 04/11/2020 0600 Gross per 24 hour  Intake --  Output 2850 ml  Net -2850 ml   Last 3 Weights 04/09/2020 04/08/2020 04/07/2020  Weight (lbs) 283 lb 4.7 oz 283 lb 283 lb  Weight (kg) 128.5 kg 128.368 kg 128.368 kg      Telemetry    Rate controlled afib - Personally Reviewed  ECG    n/a - Personally Reviewed  Physical Exam   GEN: No acute distress.   Neck: elevated JVD Cardiac: irreg, no murmurs, rubs, or gallops.  Respiratory: crackels bilateral bases GI: Soft, nontender, non-distended  MS: 1+ bialteral LE edema; No deformity. Neuro:  Nonfocal  Psych: Normal affect   Labs    High Sensitivity Troponin:  No results for input(s): TROPONINIHS in the last 720 hours.    Chemistry Recent Labs  Lab 04/08/20 1631 04/09/20 0603 04/10/20 1608  NA 140 140 136  K 2.9* 3.7 3.3*  CL 100 102 96*  CO2 24 23 27   GLUCOSE 185* 175* 209*   BUN 60* 60* 64*  CREATININE 2.96* 2.83* 2.88*  CALCIUM 8.6* 9.2 8.8*  PROT 6.4*  --   --   ALBUMIN 2.0*  --   --   AST 36  --   --   ALT 37  --   --   ALKPHOS 153*  --   --   BILITOT 0.4  --   --   GFRNONAA 14* 15* 15*  GFRAA 16* 17* 17*  ANIONGAP 16* 15 13     Hematology Recent Labs  Lab 04/08/20 1631 04/09/20 0603  WBC 10.2 12.4*  RBC 2.96* 3.02*  HGB 8.0* 8.1*  HCT 28.1* 28.7*  MCV 94.9 95.0  MCH 27.0 26.8  MCHC 28.5* 28.2*  RDW 20.8* 20.7*  PLT 361 396    BNP Recent Labs  Lab 04/08/20 1631  BNP 468.0*     DDimer No results for input(s): DDIMER in the last 168 hours.   Radiology    ECHOCARDIOGRAM COMPLETE  Result Date: 04/09/2020    ECHOCARDIOGRAM REPORT   Patient Name:   LATASHA BUCZKOWSKI Date of Exam: 04/09/2020 Medical Rec #:  [id number]       Height:  65.0 in Accession #:    [id number]      Weight:       283.3 lb Date of Birth:  [birth year]        BSA:          2.294 m Patient Age:    84 years        BP:           149/67 mmHg Patient Gender: F               HR:           87 bpm. Exam Location:  [facility] Procedure: 2D Echo, Cardiac Doppler and Color Doppler Indications:    Dyspnea 786.09 / R06.00  History:        Patient has prior history of Echocardiogram examinations, most                 recent 08/30/2016. Arrythmias:Atrial Fibrillation; Risk                 Factors:Hypertension and Diabetes.  Sonographer:    Leavy Cella RDCS (AE) Referring Phys: [id number] [place]  1. Left ventricular ejection fraction, by estimation, is 60 to 65%. The left ventricle has normal function. The left ventricle has no regional wall motion abnormalities. There is mild left ventricular hypertrophy. Left ventricular diastolic parameters are indeterminate in the setting of atrial fibrillation.  2. Right ventricular systolic function is mildly to moderately reduced. The right ventricular size is normal. Tricuspid regurgitation signal is inadequate for assessing  PA pressure.  3. Left atrial size was mildly dilated.  4. The mitral valve is abnormal. Mild mitral valve regurgitation. Moderate mitral annular calcification.  5. The aortic valve is tricuspid. There is moderate calcification of the aortic valve. Aortic valve regurgitation is not visualized. Aortic valve mean gradient measures 12.3 mmHg.  6. The inferior vena cava is dilated in size with >50% respiratory variability, suggesting right atrial pressure of 8 mmHg. FINDINGS  Left Ventricle: Left ventricular ejection fraction, by estimation, is 60 to 65%. The left ventricle has normal function. The left ventricle has no regional wall motion abnormalities. The left ventricular internal cavity size was normal in size. There is  mild left ventricular hypertrophy. Left ventricular diastolic parameters are indeterminate. Right Ventricle: The right ventricular size is normal. No increase in right ventricular wall thickness. Right ventricular systolic function is moderately reduced. Tricuspid regurgitation signal is inadequate for assessing PA pressure. Left Atrium: Left atrial size was mildly dilated. Right Atrium: Right atrial size was normal in size. Pericardium: There is no evidence of pericardial effusion. Mitral Valve: The mitral valve is abnormal. Moderate mitral annular calcification. Mild mitral valve regurgitation. Tricuspid Valve: The tricuspid valve is grossly normal. Tricuspid valve regurgitation is mild. Aortic Valve: The aortic valve is tricuspid. There is moderate calcification of the aortic valve. There is mild to moderate aortic valve annular calcification. Aortic valve regurgitation is not visualized. Aortic valve mean gradient measures 12.3 mmHg. Aortic valve peak gradient measures 19.7 mmHg. Aortic valve area, by VTI measures 0.75 cm. Pulmonic Valve: The pulmonic valve was grossly normal. Pulmonic valve regurgitation is trivial. Aorta: The aortic root is normal in size and structure. Venous: The inferior  vena cava is dilated in size with greater than 50% respiratory variability, suggesting right atrial pressure of 8 mmHg. IAS/Shunts: No atrial level shunt detected by color flow Doppler. Additional Comments: There is pleural effusion in the left lateral region.  LEFT VENTRICLE PLAX  2D LVIDd:         4.16 cm  Diastology LVIDs:         2.60 cm  LV e' medial:    8.38 cm/s LV PW:         1.28 cm  LV E/e' medial:  16.0 LV IVS:        1.15 cm  LV e' lateral:   7.51 cm/s LVOT diam:     1.80 cm  LV E/e' lateral: 17.8 LV SV:         38 LV SV Index:   17 LVOT Area:     2.54 cm  RIGHT VENTRICLE RV S prime:     9.57 cm/s LEFT ATRIUM              Index       RIGHT ATRIUM           Index LA diam:        5.10 cm  2.22 cm/m  RA Area:     17.30 cm LA Vol (A2C):   111.0 ml 48.40 ml/m RA Volume:   49.70 ml  21.67 ml/m LA Vol (A4C):   55.0 ml  23.98 ml/m LA Biplane Vol: 79.0 ml  34.44 ml/m  AORTIC VALVE AV Area (Vmax):    0.77 cm AV Area (Vmean):   0.70 cm AV Area (VTI):     0.75 cm AV Vmax:           222.00 cm/s AV Vmean:          167.667 cm/s AV VTI:            0.507 m AV Peak Grad:      19.7 mmHg AV Mean Grad:      12.3 mmHg LVOT Vmax:         67.23 cm/s LVOT Vmean:        46.400 cm/s LVOT VTI:          0.149 m LVOT/AV VTI ratio: 0.29  AORTA Ao Root diam: 2.30 cm MITRAL VALVE MV Area (PHT): 3.75 cm     SHUNTS MV Decel Time: 202 msec     Systemic VTI:  0.15 m MR Peak grad: 109.4 mmHg    Systemic Diam: 1.80 cm MR Mean grad: 78.0 mmHg MR Vmax:      523.00 cm/s MR Vmean:     421.0 cm/s MV E velocity: 134.00 cm/s Rozann Lesches MD Electronically signed by Rozann Lesches MD Signature Date/Time: 04/09/2020/12:59:54 PM    Final     Cardiac Studies     Patient Profile        84 y.o. female w/ PMH of CAD (s/p DES to LAD and DES to RCA in 2018), chronic diastolic CHF, permanent atrial fibrillation, HTN, HLD, Type 2 DM and Stage 4-5 CKD who is currrntly admitted for an acute CHF exacerbation.  Assessment & Plan    1.  Acute on chronic diastolic HF - She presented with worsening dyspnea and weight up 10-15 + lbs over her baseline - Echo this admit shows LVEF 60-65%, indet DDx, mild to mod RV dysfunction  - I/Os incomplete, 2.9 L of uop but no input documented by nursing. Labs pending this AM,renal function has been overall stable. she is on IV lasix 60mg  tid with metolazone 2.5mg  daily.  - continue diuresis, remains fluid overloaded.   2. Permanent Atrial Fibrillation - Rates have overall been well controlled by review of telemetry. Continue Toprol-XL 50 mg daily for  rate control.   - She denies any evidence of active bleeding. Remains on Eliquis 2.5 mg twice daily for anticoagulation (reduced dosing given age and renal function).   3. CAD - She is s/p DES to LAD and DES to RCA in 2018.  She does have baseline dyspnea on exertion but denies any recent chest pain. Continue ASA, beta-blocker and statin therapy.  4. CKD IV/V - Followed by Dr. Theador Hawthorne as an outpatient - follow labs with diuresis   For questions or updates, please contact [place] Please consult [email or web address] for contact info under        Signed, Carlyle Dolly, MD  04/11/2020, 8:20 AM

## 2020-04-12 LAB — MAGNESIUM: Magnesium: 1.8 mg/dL (ref 1.7–2.4)

## 2020-04-12 LAB — GLUCOSE, CAPILLARY
Glucose-Capillary: 164 mg/dL — ABNORMAL HIGH (ref 70–99)
Glucose-Capillary: 252 mg/dL — ABNORMAL HIGH (ref 70–99)
Glucose-Capillary: 271 mg/dL — ABNORMAL HIGH (ref 70–99)
Glucose-Capillary: 321 mg/dL — ABNORMAL HIGH (ref 70–99)

## 2020-04-12 LAB — BASIC METABOLIC PANEL
Anion gap: 15 (ref 5–15)
BUN: 74 mg/dL — ABNORMAL HIGH (ref 8–23)
CO2: 30 mmol/L (ref 22–32)
Calcium: 8.9 mg/dL (ref 8.9–10.3)
Chloride: 90 mmol/L — ABNORMAL LOW (ref 98–111)
Creatinine, Ser: 2.82 mg/dL — ABNORMAL HIGH (ref 0.44–1.00)
GFR calc Af Amer: 17 mL/min — ABNORMAL LOW (ref >60)
GFR calc non Af Amer: 15 mL/min — ABNORMAL LOW (ref >60)
Glucose, Bld: 182 mg/dL — ABNORMAL HIGH (ref 70–99)
Potassium: 2.4 mmol/L — CL (ref 3.5–5.1)
Sodium: 135 mmol/L (ref 135–145)

## 2020-04-12 MED ORDER — POTASSIUM CHLORIDE 10 MEQ/100ML IV SOLN
10.0000 meq | INTRAVENOUS | Status: AC
  Administered 2020-04-12 (×4): 10 meq via INTRAVENOUS
  Filled 2020-04-12 (×4): qty 100

## 2020-04-12 MED ORDER — METOLAZONE 5 MG PO TABS
2.5000 mg | ORAL_TABLET | ORAL | Status: DC
  Administered 2020-04-13: 2.5 mg via ORAL
  Filled 2020-04-12 (×2): qty 1

## 2020-04-12 MED ORDER — POTASSIUM CHLORIDE CRYS ER 20 MEQ PO TBCR
40.0000 meq | EXTENDED_RELEASE_TABLET | ORAL | Status: AC
  Administered 2020-04-12 (×2): 40 meq via ORAL
  Filled 2020-04-12 (×2): qty 2

## 2020-04-12 MED ORDER — FUROSEMIDE 10 MG/ML IJ SOLN
60.0000 mg | Freq: Two times a day (BID) | INTRAMUSCULAR | Status: DC
  Administered 2020-04-12 – 2020-04-14 (×5): 60 mg via INTRAVENOUS
  Filled 2020-04-12 (×5): qty 6

## 2020-04-12 NOTE — Progress Notes (Addendum)
PROGRESS NOTE    Kristina Howell  ZHG:[id number] DOB: [birth year] DOA: 04/08/2020 PCP: Rosalee Kaufman, PA-C    Chief Complaint  Patient presents with  . Shortness of Breath    Brief Narrative:  As per H&P written by Dr. Cathlean Sauer on 04/08/2020 Kristina Howell is a 84 y.o. female with medical history significant of atrial fibrillation, coronary artery disease, diastolic heart failure, stage IV chronic kidney disease, hypertension, obesity and type 2 diabetes mellitus. Patient reports worsening lower extremity edema for the last 4 months, over the last 10 days she has developed progressive and worsening dyspnea, to the point where she is symptomatic with minimal efforts like talking or going to the bathroom.  She has been sleeping in a recliner for long time, and she acknowledged orthopnea.    Dyspnea described as moderate to severe in intensity, worse with exertion, no frank improving factors, no associated chest pain or palpitations.  No wheezing or cough.  Positive weight gain 10 pounds over the last 7 days.  She has been seen as an outpatient by her cardiologist and her symptoms have been refractive to up titration of diuretic therapy with torsemide.   ED Course: Patient was found significantly hypervolemic, she has received 100 mg of intravenous furosemide and 40 mEq of potassium chloride along with 2 g of magnesium sulfate.  Referred for admission for evaluation.  Assessment & Plan: 1-acute respiratory failure with hypoxia in the setting of acute on chronic diastolic heart failure/HFpEF -Repeated echocardiogram: Demonstrating diastolic dysfunction with ejection fraction 60 to 65%.  See results below. (Previous results demonstrating combined CHF).  No wall motion abnormalities appreciated. -Continue low-sodium diet, strict I's and O's and daily weights -Cardiology service consultation appreciated;  -Weight is down to 264.9 pounds from 283 -Change Lasix to 60 mg every 12 hours  from every 8, metolazone adjusted as well -Clinically remains volume overloaded, overall improved, --Hypoxia has Not resolved -Continue to have shortness of breath, tachypnea with activity, wearing 2 L nasal cannula supplementation and expressing orthopnea symptoms. -Continue metoprolol -Patient has once again emphasized no hemodialysis intervention.  2-Persistent atrial fibrillation (HCC) -Continue metoprolol for rate control -Chronically on apixaban for stroke prophylaxis -Repeat CBC pending  3-hyperlipidemia -Continue statins  4-Coronary artery disease due to lipid rich plaque -Continue aspirin, metoprolol and Crestor -Patient denies chest pain.  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 -Follow CBG  6-Essential hypertension, benign -Stable, continue metolazone, Lasix and metoprolol  7-Obesity, Class III, BMI 40-49.9 (morbid obesity) (HCC) -Body mass index is 44.08 kg/m. -low calorie and portion control discussed with patient  8-acute on CKD stage 5 due to CHF and further progression of renal disease -continue to closely follow renal function trend -continue current diuretic regimen--creatinine remains around 2.8 -patient has declined HD -palliative care consulted   9-DNR -DNR/DNI -palliative care consulted.   10)Hypokalemia--potassium is down to 2.4 replace and recheck, also recheck magnesium  11)Disposition--- ALF facility is unable to take patient back until Monday, 04/14/2020 due to staffing issues, however patient remains medically Not ready for discharge at this time  DVT prophylaxis: Chronically on apixaban. Code Status: DNR/DNI Family Communication: Previously discussed with son, left  new voicemail for son on 04/12/2020 Disposition:   Status is: Inpatient  Dispo: The [facility] is from: [name] ([facility]).              Anticipated d/c is to: ALF with [facility]  Anticipated d/c date is: ALF  Facility is Unable to take her back until Monday 04/14/20 due to staffing issues--    - Patient currently is not medically stable for discharge; still complaining of shortness of breath with increased amount of fluid overload/anasarca; continue using 2 L nasal cannula supplementation.  Has expressed the wishes of not pursuing hemodialysis but okay to continue aggressive IV diuresis and for short-term if require CPAP/BiPAP.  Patient now DNR/DNI after discussion with palliative care services.      Consultants:   Cardiology service  Palliative care   Procedures:  See below for x-ray report 2D echo:  1. Left ventricular ejection fraction, by estimation, is 60 to 65%. The  left ventricle has normal function. The left ventricle has no regional  wall motion abnormalities. There is mild left ventricular hypertrophy.  Left ventricular diastolic parameters  are indeterminate in the setting of atrial fibrillation.  2. Right ventricular systolic function is mildly to moderately reduced.  The right ventricular size is normal. Tricuspid regurgitation signal is  inadequate for assessing PA pressure.  3. Left atrial size was mildly dilated.  4. The mitral valve is abnormal. Mild mitral valve regurgitation.  Moderate mitral annular calcification.  5. The aortic valve is tricuspid. There is moderate calcification of the  aortic valve. Aortic valve regurgitation is not visualized. Aortic valve  mean gradient measures 12.3 mmHg.  6. The inferior vena cava is dilated in size with >50% respiratory  variability, suggesting right atrial pressure of 8 mmHg   Antimicrobials:  None   Subjective:  -Weight is trending down -Dyspnea on exertion persist -New onset hypoxia persist continues to require oxygen  Objective: Vitals:   04/11/20 2307 04/12/20 0500 04/12/20 0608 04/12/20 1000  BP: (!) 149/56  (!) 139/49   Pulse: 88  76   Resp: 20  17   Temp: 98.8 F (37.1 C)  98 F (36.7 C)   TempSrc:       SpO2: 99%  99%   Weight:  123.3 kg  120.2 kg  Height:        Intake/Output Summary (Last 24 hours) at 04/12/2020 1205 Last data filed at 04/12/2020 0708 Gross per 24 hour  Intake --  Output 800 ml  Net -800 ml   Filed Weights   04/09/20 0500 04/12/20 0500 04/12/20 1000  Weight: 128.5 kg 123.3 kg 120.2 kg    Examination: General exam: Alert, awake, oriented x 3;  NOSE--- [place]  2 L/min - Respiratory system: Fine crackles at the bases, air movement is fair Cardiovascular system: Irregular, no rubs, no gallops,  Gastrointestinal system: Abdomen is obese, nondistended, soft and nontender.. Normal bowel sounds heard. Central nervous system: Generalized weakness, alert and oriented. No focal neurological deficits. Extremities:  2+ edema bilaterally, chronic venous stasis type dermatitis changes, Rt medial thigh area with healing old wound Skin: No petechiae; positive chronic stasis dermatitis and lymphedema changes bilaterally. Psychiatry: Judgement and insight appear normal. Mood & affect appropriate.    Data Reviewed: I have personally reviewed following labs and imaging studies  CBC: Recent Labs  Lab 04/08/20 1631 04/09/20 0603  WBC 10.2 12.4*  HGB 8.0* 8.1*  HCT 28.1* 28.7*  MCV 94.9 95.0  PLT 361 542    Basic Metabolic Panel: Recent Labs  Lab 04/08/20 1631 04/09/20 0603 04/10/20 1608 04/11/20 0732 04/12/20 0800  NA 140 140 136 136 135  K 2.9* 3.7 3.3* 2.8* 2.4*  CL 100 102 96* 96* 90*  CO2 24 23  27 27 30   GLUCOSE 185* 175* 209* 197* 182*  BUN 60* 60* 64* 68* 74*  CREATININE 2.96* 2.83* 2.88* 2.82* 2.82*  CALCIUM 8.6* 9.2 8.8* 8.7* 8.9  MG 1.6* 2.1  --   --   --     GFR: Estimated Creatinine Clearance: 20 mL/min (A) (by C-G formula based on SCr of 2.82 mg/dL (H)).  Liver Function Tests: Recent Labs  Lab 04/08/20 1631  AST 36  ALT 37  ALKPHOS 153*  BILITOT 0.4  PROT 6.4*  ALBUMIN 2.0*    CBG: Recent Labs  Lab 04/11/20 1127 04/11/20 1632  04/11/20 2047 04/12/20 0757 04/12/20 1100  GLUCAP 235* 245* 223* 164* 252*     Recent Results (from the past 240 hour(s))  SARS Coronavirus 2 by RT PCR (hospital order, performed in [facility] hospital lab) Nasopharyngeal Nasopharyngeal Swab     Status: None   Collection Time: 04/08/20  4:03 PM   Specimen: Nasopharyngeal Swab  Result Value Ref Range Status   SARS Coronavirus 2 NEGATIVE NEGATIVE Final    Comment: (NOTE) SARS-CoV-2 target nucleic acids are NOT DETECTED.  The SARS-CoV-2 RNA is generally detectable in upper and lower respiratory specimens during the acute phase of infection. The lowest concentration of SARS-CoV-2 viral copies this assay can detect is 250 copies / mL. A negative result does not preclude SARS-CoV-2 infection and should not be used as the sole basis for treatment or other patient management decisions.  A negative result may occur with improper specimen collection / handling, submission of specimen other than nasopharyngeal swab, presence of viral mutation(s) within the areas targeted by this assay, and inadequate number of viral copies (<250 copies / mL). A negative result must be combined with clinical observations, patient history, and epidemiological information.  Fact Sheet for Patients:   StrictlyIdeas.no  Fact Sheet for Healthcare Providers: BankingDealers.co.za  This test is not yet approved or  cleared by the [place] FDA and has been authorized for detection and/or diagnosis of SARS-CoV-2 by FDA under an Emergency Use Authorization (EUA).  This EUA will remain in effect (meaning this test can be used) for the duration of the COVID-19 declaration under Section 564(b)(1) of the Act, 21 U.S.C. section 360bbb-3(b)(1), unless the authorization is terminated or revoked sooner.  Performed at [facility], [address], [place], [place] [address]      Radiology Studies: No results  found.  Scheduled Meds: . allopurinol  300 mg Oral Daily  . apixaban  2.5 mg Oral BID  . aspirin EC  81 mg Oral Daily  . cholecalciferol  2,000 Units Oral Daily  . furosemide  60 mg Intravenous Q12H  . insulin aspart  0-6 Units Subcutaneous TID WC  . [START ON 04/13/2020] metolazone  2.5 mg Oral Q T,Th,S,Su  . metoprolol succinate  50 mg Oral QHS  . potassium chloride  40 mEq Oral Q3H  . rosuvastatin  20 mg Oral Daily   Continuous Infusions: . potassium chloride       LOS: 4 days   Roxan Hockey, MD [facility]   04/12/2020, 12:05 PM

## 2020-04-12 NOTE — Plan of Care (Signed)

## 2020-04-12 NOTE — Progress Notes (Signed)
CRITICAL VALUE ALERT  Critical Value:  K+ 2.4  Date & Time Notied:  04/12/2020 @ 0932  Provider Notified: MD Courage Emopkae  Orders Received/Actions taken: None at present

## 2020-04-13 LAB — GLUCOSE, CAPILLARY
Glucose-Capillary: 186 mg/dL — ABNORMAL HIGH (ref 70–99)
Glucose-Capillary: 243 mg/dL — ABNORMAL HIGH (ref 70–99)
Glucose-Capillary: 268 mg/dL — ABNORMAL HIGH (ref 70–99)
Glucose-Capillary: 293 mg/dL — ABNORMAL HIGH (ref 70–99)

## 2020-04-13 LAB — BASIC METABOLIC PANEL
Anion gap: 11 (ref 5–15)
BUN: 78 mg/dL — ABNORMAL HIGH (ref 8–23)
CO2: 29 mmol/L (ref 22–32)
Calcium: 8.8 mg/dL — ABNORMAL LOW (ref 8.9–10.3)
Chloride: 92 mmol/L — ABNORMAL LOW (ref 98–111)
Creatinine, Ser: 2.97 mg/dL — ABNORMAL HIGH (ref 0.44–1.00)
GFR calc Af Amer: 16 mL/min — ABNORMAL LOW (ref >60)
GFR calc non Af Amer: 14 mL/min — ABNORMAL LOW (ref >60)
Glucose, Bld: 190 mg/dL — ABNORMAL HIGH (ref 70–99)
Potassium: 3.2 mmol/L — ABNORMAL LOW (ref 3.5–5.1)
Sodium: 132 mmol/L — ABNORMAL LOW (ref 135–145)

## 2020-04-13 MED ORDER — POTASSIUM CHLORIDE CRYS ER 20 MEQ PO TBCR
40.0000 meq | EXTENDED_RELEASE_TABLET | ORAL | Status: AC
  Administered 2020-04-13 (×2): 40 meq via ORAL
  Filled 2020-04-13 (×2): qty 2

## 2020-04-13 NOTE — Progress Notes (Signed)
PROGRESS NOTE    Kristina Howell  UXN:[id number] DOB: August 18, 1935 DOA: 04/08/2020 PCP: Rosalee Kaufman, PA-C    Chief Complaint  Patient presents with   Shortness of Breath    Brief Narrative:  As per H&P written by Dr. Cathlean Sauer on 04/08/2020 Kristina Howell is a 84 y.o. female with medical history significant of atrial fibrillation, coronary artery disease, diastolic heart failure, stage IV chronic kidney disease, hypertension, obesity and type 2 diabetes mellitus. Patient reports worsening lower extremity edema for the last 4 months, over the last 10 days she has developed progressive and worsening dyspnea, to the point where she is symptomatic with minimal efforts like talking or going to the bathroom.  She has been sleeping in a recliner for long time, and she acknowledged orthopnea.    Dyspnea described as moderate to severe in intensity, worse with exertion, no frank improving factors, no associated chest pain or palpitations.  No wheezing or cough.  Positive weight gain 10 pounds over the last 7 days.  She has been seen as an outpatient by her cardiologist and her symptoms have been refractive to up titration of diuretic therapy with torsemide.   ED Course: Patient was found significantly hypervolemic, she has received 100 mg of intravenous furosemide and 40 mEq of potassium chloride along with 2 g of magnesium sulfate.  Referred for admission for evaluation.  Assessment & Plan: 1-acute respiratory failure with hypoxia in the setting of acute on chronic diastolic heart failure/HFpEF -Repeated echocardiogram: Demonstrating diastolic dysfunction with ejection fraction 60 to 65%.  See results below. (Previous results demonstrating combined CHF).  No wall motion abnormalities appreciated. -Continue low-sodium diet, strict I's and O's and daily weights -Cardiology service consultation appreciated;  -Weight is down to 264.9 on 04/12/20 pounds from 283  -Weight on 04/13/2020 is not  a standing scale weight, probably inaccurate -Changed Lasix to 60 mg every 12 hours from every 8, metolazone adjusted as well -Clinically remains volume overloaded, overall improved, --Hypoxia has Not resolved -Continue to have shortness of breath, tachypnea with activity, wearing 2 L nasal cannula supplementation and expressing orthopnea symptoms. -Continue metoprolol -Patient has once again emphasized no hemodialysis intervention.  2-Persistent atrial fibrillation (HCC) -Continue metoprolol for rate control -Chronically on apixaban for stroke prophylaxis    3-hyperlipidemia -Continue statins  4-Coronary artery disease due to lipid rich plaque -Continue aspirin, metoprolol and Crestor -Patient denies chest pain.  5-type II diabetes mellitus ([place]) with nephropathy -Continue sliding scale insulin. -Follow CBG  6-Essential hypertension, benign -Stable, continue metolazone, Lasix and metoprolol  7-Obesity, Class III, BMI 40-49.9 (morbid obesity) (HCC) -Body mass index is 45.16 kg/m. -low calorie and portion control discussed with patient  8-acute on CKD stage 5 due to CHF and further progression of renal disease -continue to closely follow renal function trend -continue current diuretic regimen--creatinine remains around 2.9 -patient has declined HD -palliative care consulted   9-DNR -DNR/DNI -palliative care consulted.   10)Hypokalemia--replace and recheck, magnesium WNL  11)Disposition--- ALF facility is unable to take patient back until Monday, 04/14/2020 due to staffing issues, however patient remains medically Not ready for discharge at this time  DVT prophylaxis: Chronically on apixaban. Code Status: DNR/DNI Family Communication: Previously discussed with son, left  new voicemail for son on 04/12/2020 Disposition:   Status is: Inpatient  Dispo: The [facility] is from: [name] ([facility] living facility).              Anticipated d/c is to: ALF with [facility] PT  Anticipated d/c date is: ALF Facility is Unable to take her back until Monday 04/14/20 due to staffing issues--    - Patient currently is not medically stable for discharge; still complaining of shortness of breath with increased amount of fluid overload/anasarca; continue using 2 L nasal cannula supplementation.  Has expressed the wishes of not pursuing hemodialysis but okay to continue aggressive IV diuresis and for short-term if require CPAP/BiPAP.  Patient now DNR/DNI after discussion with palliative care services.      Consultants:   Cardiology service  Palliative care   Procedures:  See below for x-ray report 2D echo:  1. Left ventricular ejection fraction, by estimation, is 60 to 65%. The  left ventricle has normal function. The left ventricle has no regional  wall motion abnormalities. There is mild left ventricular hypertrophy.  Left ventricular diastolic parameters  are indeterminate in the setting of atrial fibrillation.  2. Right ventricular systolic function is mildly to moderately reduced.  The right ventricular size is normal. Tricuspid regurgitation signal is  inadequate for assessing PA pressure.  3. Left atrial size was mildly dilated.  4. The mitral valve is abnormal. Mild mitral valve regurgitation.  Moderate mitral annular calcification.  5. The aortic valve is tricuspid. There is moderate calcification of the  aortic valve. Aortic valve regurgitation is not visualized. Aortic valve  mean gradient measures 12.3 mmHg.  6. The inferior vena cava is dilated in size with >50% respiratory  variability, suggesting right atrial pressure of 8 mmHg   Antimicrobials:  None   Subjective:  04/13/20- -less short of breath, -Hypoxia not resolved -Voiding well  Objective: Vitals:   04/12/20 1609 04/12/20 2115 04/13/20 0607 04/13/20 1434  BP: (!) 116/52 (!) 156/68 (!) 134/48 (!) 153/70  Pulse: 66 77 68 83  Resp: 20 20 20  (!) 28  Temp: 97.7 F (36.5 C)  97.8 F (36.6 C) 98.2 F (36.8 C) 97.7 F (36.5 C)  TempSrc:  Oral Oral Oral  SpO2: 100% 100% 100% 99%  Weight:   123.1 kg   Height:        Intake/Output Summary (Last 24 hours) at 04/13/2020 1649 Last data filed at 04/13/2020 0646 Gross per 24 hour  Intake --  Output 1100 ml  Net -1100 ml   Filed Weights   04/12/20 0500 04/12/20 1000 04/13/20 0607  Weight: 123.3 kg 120.2 kg 123.1 kg    Examination: General exam: Alert, awake, oriented x 3;  NOSE--- [place]  2 L/min - Respiratory system: Fine crackles at the bases, air movement is fair Cardiovascular system: Irregular, no rubs, no gallops,  Gastrointestinal system: Abdomen is obese, nondistended, soft and nontender.. Normal bowel sounds heard. Central nervous system: Generalized weakness, alert and oriented. No focal neurological deficits. Extremities:  2+ edema bilaterally, chronic venous stasis type dermatitis changes, Rt lower medial thigh area with healing old wound Skin: No petechiae; positive chronic stasis dermatitis and lymphedema changes bilaterally. Psychiatry: Judgement and insight appear normal. Mood & affect appropriate.    Data Reviewed: I have personally reviewed following labs and imaging studies  CBC: Recent Labs  Lab 04/08/20 1631 04/09/20 0603  WBC 10.2 12.4*  HGB 8.0* 8.1*  HCT 28.1* 28.7*  MCV 94.9 95.0  PLT 361 250    Basic Metabolic Panel: Recent Labs  Lab 04/08/20 1631 04/08/20 1631 04/09/20 0603 04/10/20 1608 04/11/20 0732 04/12/20 0800 04/13/20 0751  NA 140   < > 140 136 136 135 132*  K 2.9*   < >  3.7 3.3* 2.8* 2.4* 3.2*  CL 100   < > 102 96* 96* 90* 92*  CO2 24   < > 23 27 27 30 29   GLUCOSE 185*   < > 175* 209* 197* 182* 190*  BUN 60*   < > 60* 64* 68* 74* 78*  CREATININE 2.96*   < > 2.83* 2.88* 2.82* 2.82* 2.97*  CALCIUM 8.6*   < > 9.2 8.8* 8.7* 8.9 8.8*  MG 1.6*  --  2.1  --   --  1.8  --    < > = values in this interval not displayed.    GFR: Estimated Creatinine Clearance:  19.2 mL/min (A) (by C-G formula based on SCr of 2.97 mg/dL (H)).  Liver Function Tests: Recent Labs  Lab 04/08/20 1631  AST 36  ALT 37  ALKPHOS 153*  BILITOT 0.4  PROT 6.4*  ALBUMIN 2.0*    CBG: Recent Labs  Lab 04/12/20 1100 04/12/20 1617 04/12/20 2118 04/13/20 0751 04/13/20 1217  GLUCAP 252* 271* 321* 186* 243*     Recent Results (from the past 240 hour(s))  SARS Coronavirus 2 by RT PCR (hospital order, performed in [facility] hospital lab) Nasopharyngeal Nasopharyngeal Swab     Status: None   Collection Time: 04/08/20  4:03 PM   Specimen: Nasopharyngeal Swab  Result Value Ref Range Status   SARS Coronavirus 2 NEGATIVE NEGATIVE Final    Comment: (NOTE) SARS-CoV-2 target nucleic acids are NOT DETECTED.  The SARS-CoV-2 RNA is generally detectable in upper and lower respiratory specimens during the acute phase of infection. The lowest concentration of SARS-CoV-2 viral copies this assay can detect is 250 copies / mL. A negative result does not preclude SARS-CoV-2 infection and should not be used as the sole basis for treatment or other patient management decisions.  A negative result may occur with improper specimen collection / handling, submission of specimen other than nasopharyngeal swab, presence of viral mutation(s) within the areas targeted by this assay, and inadequate number of viral copies (<250 copies / mL). A negative result must be combined with clinical observations, patient history, and epidemiological information.  Fact Sheet for Patients:   StrictlyIdeas.no  Fact Sheet for Healthcare Providers: BankingDealers.co.za  This test is not yet approved or  cleared by the [place] FDA and has been authorized for detection and/or diagnosis of SARS-CoV-2 by FDA under an Emergency Use Authorization (EUA).  This EUA will remain in effect (meaning this test can be used) for the duration of the COVID-19  declaration under Section 564(b)(1) of the Act, 21 U.S.C. section 360bbb-3(b)(1), unless the authorization is terminated or revoked sooner.  Performed at [facility], [address], [place], [place] [address]      Radiology Studies: No results found.  Scheduled Meds:  allopurinol  300 mg Oral Daily   apixaban  2.5 mg Oral BID   aspirin EC  81 mg Oral Daily   cholecalciferol  2,000 Units Oral Daily   furosemide  60 mg Intravenous Q12H   insulin aspart  0-6 Units Subcutaneous TID WC   metolazone  2.5 mg Oral Q T,Th,S,Su   metoprolol succinate  50 mg Oral QHS   potassium chloride  40 mEq Oral Q3H   rosuvastatin  20 mg Oral Daily   Continuous Infusions:    LOS: 5 days   Roxan Hockey, MD [facility]   04/13/2020, 4:49 PM

## 2020-04-14 ENCOUNTER — Inpatient Hospital Stay
Admission: RE | Admit: 2020-04-14 | Discharge: 2020-05-02 | Disposition: A | Discharge status: Hospice | Payer: PPO | Source: Ambulatory Visit | Attending: Internal Medicine | Admitting: Internal Medicine

## 2020-04-14 DIAGNOSIS — M6281 Muscle weakness (generalized): Secondary | ICD-10-CM | POA: Diagnosis not present

## 2020-04-14 DIAGNOSIS — Z79899 Other long term (current) drug therapy: Secondary | ICD-10-CM | POA: Diagnosis not present

## 2020-04-14 DIAGNOSIS — E876 Hypokalemia: Secondary | ICD-10-CM | POA: Diagnosis not present

## 2020-04-14 DIAGNOSIS — J9601 Acute respiratory failure with hypoxia: Secondary | ICD-10-CM | POA: Diagnosis not present

## 2020-04-14 DIAGNOSIS — I110 Hypertensive heart disease with heart failure: Secondary | ICD-10-CM | POA: Diagnosis not present

## 2020-04-14 DIAGNOSIS — G8929 Other chronic pain: Secondary | ICD-10-CM | POA: Diagnosis not present

## 2020-04-14 DIAGNOSIS — Z66 Do not resuscitate: Secondary | ICD-10-CM | POA: Diagnosis not present

## 2020-04-14 DIAGNOSIS — N185 Chronic kidney disease, stage 5: Secondary | ICD-10-CM | POA: Diagnosis not present

## 2020-04-14 DIAGNOSIS — E785 Hyperlipidemia, unspecified: Secondary | ICD-10-CM | POA: Diagnosis not present

## 2020-04-14 DIAGNOSIS — I120 Hypertensive chronic kidney disease with stage 5 chronic kidney disease or end stage renal disease: Secondary | ICD-10-CM | POA: Diagnosis not present

## 2020-04-14 DIAGNOSIS — L03115 Cellulitis of right lower limb: Secondary | ICD-10-CM | POA: Diagnosis not present

## 2020-04-14 DIAGNOSIS — Z7189 Other specified counseling: Secondary | ICD-10-CM | POA: Diagnosis not present

## 2020-04-14 DIAGNOSIS — I25110 Atherosclerotic heart disease of native coronary artery with unstable angina pectoris: Secondary | ICD-10-CM | POA: Diagnosis not present

## 2020-04-14 DIAGNOSIS — I10 Essential (primary) hypertension: Secondary | ICD-10-CM | POA: Diagnosis not present

## 2020-04-14 DIAGNOSIS — L89321 Pressure ulcer of left buttock, stage 1: Secondary | ICD-10-CM | POA: Diagnosis not present

## 2020-04-14 DIAGNOSIS — R262 Difficulty in walking, not elsewhere classified: Secondary | ICD-10-CM | POA: Diagnosis not present

## 2020-04-14 DIAGNOSIS — Z741 Need for assistance with personal care: Secondary | ICD-10-CM | POA: Diagnosis not present

## 2020-04-14 DIAGNOSIS — E11621 Type 2 diabetes mellitus with foot ulcer: Secondary | ICD-10-CM | POA: Diagnosis not present

## 2020-04-14 DIAGNOSIS — I132 Hypertensive heart and chronic kidney disease with heart failure and with stage 5 chronic kidney disease, or end stage renal disease: Secondary | ICD-10-CM | POA: Diagnosis not present

## 2020-04-14 DIAGNOSIS — I509 Heart failure, unspecified: Secondary | ICD-10-CM | POA: Diagnosis not present

## 2020-04-14 DIAGNOSIS — L03116 Cellulitis of left lower limb: Secondary | ICD-10-CM | POA: Diagnosis not present

## 2020-04-14 DIAGNOSIS — I2583 Coronary atherosclerosis due to lipid rich plaque: Secondary | ICD-10-CM | POA: Diagnosis not present

## 2020-04-14 DIAGNOSIS — Z9981 Dependence on supplemental oxygen: Secondary | ICD-10-CM | POA: Diagnosis not present

## 2020-04-14 DIAGNOSIS — F322 Major depressive disorder, single episode, severe without psychotic features: Secondary | ICD-10-CM | POA: Diagnosis not present

## 2020-04-14 DIAGNOSIS — E1122 Type 2 diabetes mellitus with diabetic chronic kidney disease: Secondary | ICD-10-CM | POA: Diagnosis not present

## 2020-04-14 DIAGNOSIS — Z7901 Long term (current) use of anticoagulants: Secondary | ICD-10-CM | POA: Diagnosis not present

## 2020-04-14 DIAGNOSIS — R600 Localized edema: Secondary | ICD-10-CM | POA: Diagnosis not present

## 2020-04-14 DIAGNOSIS — I429 Cardiomyopathy, unspecified: Secondary | ICD-10-CM | POA: Diagnosis not present

## 2020-04-14 DIAGNOSIS — M1A9XX0 Chronic gout, unspecified, without tophus (tophi): Secondary | ICD-10-CM | POA: Diagnosis not present

## 2020-04-14 DIAGNOSIS — E1129 Type 2 diabetes mellitus with other diabetic kidney complication: Secondary | ICD-10-CM | POA: Diagnosis not present

## 2020-04-14 DIAGNOSIS — I890 Lymphedema, not elsewhere classified: Secondary | ICD-10-CM | POA: Diagnosis not present

## 2020-04-14 DIAGNOSIS — I2510 Atherosclerotic heart disease of native coronary artery without angina pectoris: Secondary | ICD-10-CM | POA: Diagnosis not present

## 2020-04-14 DIAGNOSIS — M1A39X0 Chronic gout due to renal impairment, multiple sites, without tophus (tophi): Secondary | ICD-10-CM | POA: Diagnosis not present

## 2020-04-14 DIAGNOSIS — R0602 Shortness of breath: Secondary | ICD-10-CM | POA: Diagnosis not present

## 2020-04-14 DIAGNOSIS — E1169 Type 2 diabetes mellitus with other specified complication: Secondary | ICD-10-CM | POA: Diagnosis not present

## 2020-04-14 DIAGNOSIS — Z993 Dependence on wheelchair: Secondary | ICD-10-CM | POA: Diagnosis not present

## 2020-04-14 DIAGNOSIS — Z794 Long term (current) use of insulin: Secondary | ICD-10-CM | POA: Diagnosis not present

## 2020-04-14 DIAGNOSIS — Z515 Encounter for palliative care: Secondary | ICD-10-CM | POA: Diagnosis not present

## 2020-04-14 DIAGNOSIS — R52 Pain, unspecified: Secondary | ICD-10-CM | POA: Diagnosis not present

## 2020-04-14 DIAGNOSIS — I5032 Chronic diastolic (congestive) heart failure: Secondary | ICD-10-CM | POA: Diagnosis not present

## 2020-04-14 DIAGNOSIS — I5033 Acute on chronic diastolic (congestive) heart failure: Secondary | ICD-10-CM | POA: Diagnosis not present

## 2020-04-14 DIAGNOSIS — E1159 Type 2 diabetes mellitus with other circulatory complications: Secondary | ICD-10-CM | POA: Diagnosis not present

## 2020-04-14 DIAGNOSIS — I4819 Other persistent atrial fibrillation: Secondary | ICD-10-CM | POA: Diagnosis not present

## 2020-04-14 LAB — RESPIRATORY PANEL BY RT PCR (FLU A&B, COVID)
Influenza A by PCR: NEGATIVE
Influenza B by PCR: NEGATIVE
SARS Coronavirus 2 by RT PCR: NEGATIVE

## 2020-04-14 LAB — CBC
HCT: 25.5 % — ABNORMAL LOW (ref 36.0–46.0)
Hemoglobin: 7.2 g/dL — ABNORMAL LOW (ref 12.0–15.0)
MCH: 26.7 pg (ref 26.0–34.0)
MCHC: 28.2 g/dL — ABNORMAL LOW (ref 30.0–36.0)
MCV: 94.4 fL (ref 80.0–100.0)
Platelets: 347 10*3/uL (ref 150–400)
RBC: 2.7 MIL/uL — ABNORMAL LOW (ref 3.87–5.11)
RDW: 19.2 % — ABNORMAL HIGH (ref 11.5–15.5)
WBC: 11.1 10*3/uL — ABNORMAL HIGH (ref 4.0–10.5)
nRBC: 0 % (ref 0.0–0.2)

## 2020-04-14 LAB — BASIC METABOLIC PANEL
Anion gap: 12 (ref 5–15)
BUN: 86 mg/dL — ABNORMAL HIGH (ref 8–23)
CO2: 29 mmol/L (ref 22–32)
Calcium: 8.7 mg/dL — ABNORMAL LOW (ref 8.9–10.3)
Chloride: 92 mmol/L — ABNORMAL LOW (ref 98–111)
Creatinine, Ser: 3.1 mg/dL — ABNORMAL HIGH (ref 0.44–1.00)
GFR calc Af Amer: 15 mL/min — ABNORMAL LOW (ref >60)
GFR calc non Af Amer: 13 mL/min — ABNORMAL LOW (ref >60)
Glucose, Bld: 201 mg/dL — ABNORMAL HIGH (ref 70–99)
Potassium: 3.8 mmol/L (ref 3.5–5.1)
Sodium: 133 mmol/L — ABNORMAL LOW (ref 135–145)

## 2020-04-14 LAB — GLUCOSE, CAPILLARY
Glucose-Capillary: 206 mg/dL — ABNORMAL HIGH (ref 70–99)
Glucose-Capillary: 244 mg/dL — ABNORMAL HIGH (ref 70–99)
Glucose-Capillary: 248 mg/dL — ABNORMAL HIGH (ref 70–99)

## 2020-04-14 LAB — MAGNESIUM: Magnesium: 1.9 mg/dL (ref 1.7–2.4)

## 2020-04-14 MED ORDER — METOLAZONE 2.5 MG PO TABS
2.5000 mg | ORAL_TABLET | ORAL | 0 refills | Status: DC
Start: 2020-04-14 — End: 2020-04-18

## 2020-04-14 MED ORDER — ACETAMINOPHEN 500 MG PO TABS: 1000.0000 mg | ORAL_TABLET | Freq: Three times a day (TID) | ORAL | 0 refills | Status: AC

## 2020-04-14 MED ORDER — DARBEPOETIN ALFA 100 MCG/0.5ML IJ SOSY
100.0000 ug | PREFILLED_SYRINGE | Freq: Once | INTRAMUSCULAR | 0 refills | Status: AC
Start: 2020-04-14 — End: 2020-04-14

## 2020-04-14 MED ORDER — VITAMIN D 50 MCG (2000 UT) PO CAPS
1.0000 | ORAL_CAPSULE | Freq: Every day | ORAL | 1 refills | Status: DC
Start: 2020-04-14 — End: 2020-04-30

## 2020-04-14 MED ORDER — POTASSIUM CHLORIDE ER 10 MEQ PO TBCR: 20.0000 meq | EXTENDED_RELEASE_TABLET | Freq: Every day | ORAL | 3 refills | Status: DC

## 2020-04-14 MED ORDER — ONDANSETRON HCL 4 MG PO TABS: 4.0000 mg | ORAL_TABLET | Freq: Four times a day (QID) | ORAL | 0 refills | Status: AC | PRN

## 2020-04-14 MED ORDER — DARBEPOETIN ALFA 100 MCG/0.5ML IJ SOSY
100.0000 ug | PREFILLED_SYRINGE | Freq: Once | INTRAMUSCULAR | Status: AC
  Administered 2020-04-14: 100 ug via SUBCUTANEOUS
  Filled 2020-04-14: qty 0.5

## 2020-04-14 MED ORDER — TRAMADOL HCL 50 MG PO TABS
50.0000 mg | ORAL_TABLET | Freq: Four times a day (QID) | ORAL | 0 refills | Status: DC | PRN
Start: 2020-04-14 — End: 2020-04-18

## 2020-04-14 MED ORDER — TORSEMIDE 20 MG PO TABS
100.0000 mg | ORAL_TABLET | Freq: Two times a day (BID) | ORAL | 2 refills | Status: DC
Start: 2020-04-14 — End: 2020-04-22

## 2020-04-14 MED ORDER — INSULIN ASPART 100 UNIT/ML ~~LOC~~ SOLN: 0.0000 [IU] | Freq: Three times a day (TID) | SUBCUTANEOUS | 11 refills | Status: DC

## 2020-04-14 NOTE — Progress Notes (Signed)
Inpatient Diabetes Program Recommendations  AACE/ADA: New Consensus Statement on Inpatient Glycemic Control   Target Ranges:  Prepandial:   less than 140 mg/dL      Peak postprandial:   less than 180 mg/dL (1-2 hours)      Critically ill patients:  140 - 180 mg/dL   Results for NAVPREET, [id number] (MRN [id number]) as of 04/14/2020 13:18  Ref. Range 04/13/2020 07:51 04/13/2020 12:17 04/13/2020 17:16 04/13/2020 22:12 04/14/2020 08:19 04/14/2020 12:07  Glucose-Capillary Latest Ref Range: 70 - 99 mg/dL 186 (H) 243 (H) 268 (H) 293 (H) 206 (H) 244 (H)   Review of Glycemic Control  Diabetes history: DM2 Outpatient Diabetes medications: Toujeo 60 units QHS, Humalog 4-16 units per sliding scale Current orders for Inpatient glycemic control: Novolog 0-6 units TID with meals  Inpatient Diabetes Program Recommendations:    Insulin: Please consider ordering Novolog 0-5 units QHS for bedtime correction. Please consider ordering Novolog 3 units TID with meals for meal coverage if patient eats at least 50% of meals.  Thanks, Barnie Alderman, RN, MSN, CDE Diabetes Coordinator Inpatient Diabetes Program [phone] (Team Pager from 8am to 5pm)

## 2020-04-14 NOTE — Progress Notes (Signed)
Report called and given to Jenell Milliner LPN, [facility]. All questions were answered and no further questions at this time. Patient will be transported via [facility] staff.

## 2020-04-14 NOTE — Evaluation (Signed)
Physical Therapy Evaluation Patient Details Name: Kristina Howell MRN: [id number] DOB: January 05, 1936 Today's Date: 04/14/2020   History of Present Illness  Kristina Howell is a 84 y.o. female with medical history significant of atrial fibrillation, coronary artery disease, diastolic heart failure, stage IV chronic kidney disease, hypertension, obesity and type 2 diabetes mellitus.Patient reports worsening lower extremity edema for the last 4 months, over the last 10 days she has developed progressive and worsening dyspnea, to the point where she is symptomatic with minimal efforts like talking or going to the bathroom.  She has been sleeping in a recliner for long time, and she acknowledged orthopnea.    Clinical Impression  Patient demonstrates slow labored movement during bed mobility and required bed raised to complete sit to stand with Mod/Max assist.  Patient unable to take side steps or step forward mostly due to left foot pain and BLE weakness.  Patient requested to go back to bed due to c/o fatigue.  Patient will benefit from continued physical therapy in hospital and recommended venue below to increase strength, balance, endurance for safe ADLs and gait.    Follow Up Recommendations SNF    Equipment Recommendations  None recommended by PT    Recommendations for Other Services       Precautions / Restrictions Precautions Precautions: Fall Restrictions Weight Bearing Restrictions: No      Mobility  Bed Mobility Overal bed mobility: Needs Assistance Bed Mobility: Supine to Sit;Sit to Supine     Supine to sit: Mod assist Sit to supine: Mod assist   General bed mobility comments: slow labored movement with c/o severe pain with pressue behind thighs and lower legs  Transfers Overall transfer level: Needs assistance Equipment used: Rolling walker (2 wheeled) Transfers: Sit to/from Stand Sit to Stand: From elevated surface;Mod assist;Max assist         General transfer  comment: required bed raside to complete sit to stand due to BLE weakness  Ambulation/Gait                Stairs            Wheelchair Mobility    Modified Rankin (Stroke Patients Only)       Balance Overall balance assessment: Needs assistance Sitting-balance support: Feet supported Sitting balance-Leahy Scale: Fair Sitting balance - Comments: seated at EOB   Standing balance support: During functional activity;Bilateral upper extremity supported Standing balance-Leahy Scale: Poor Standing balance comment: using RW                             Pertinent Vitals/Pain Pain Assessment: Faces Faces Pain Scale: Hurts whole lot Pain Location: posterior BLE with pressure Pain Descriptors / Indicators: Sore;Grimacing;Guarding;Sharp Pain Intervention(s): Limited activity within patient's tolerance;Monitored during session;Repositioned    Home Living Family/patient expects to be discharged to:: Assisted living               Home Equipment: Gilford Rile - 2 wheels      Prior Function Level of Independence: Needs assistance   Gait / Transfers Assistance Needed: Household ambulator using RW  ADL's / Homemaking Assistance Needed: assisted for bathing by ALF staff        Hand Dominance   Dominant Hand: Right    Extremity/Trunk Assessment   Upper Extremity Assessment Upper Extremity Assessment: Generalized weakness    Lower Extremity Assessment Lower Extremity Assessment: Generalized weakness    Cervical / Trunk Assessment Cervical / Trunk Assessment: Normal  Communication   Communication: No difficulties  Cognition Arousal/Alertness: Awake/alert Behavior During Therapy: WFL for tasks assessed/performed Overall Cognitive Status: Within Functional Limits for tasks assessed                                        General Comments      Exercises     Assessment/Plan    PT Assessment Patient needs continued PT services   PT Problem List Decreased strength;Decreased activity tolerance;Decreased balance;Decreased mobility       PT Treatment Interventions Balance training;DME instruction;Gait training;Stair training;Functional mobility training;Therapeutic activities;Therapeutic exercise;Patient/family education    PT Goals (Current goals can be found in the Care Plan section)  Acute Rehab PT Goals Patient Stated Goal: return home PT Goal Formulation: With patient Time For Goal Achievement: 04/28/20 Potential to Achieve Goals: Good    Frequency Min 3X/week   Barriers to discharge        Co-evaluation               AM-PAC PT "6 Clicks" Mobility  Outcome Measure Help needed turning from your back to your side while in a flat bed without using bedrails?: A Lot Help needed moving from lying on your back to sitting on the side of a flat bed without using bedrails?: A Lot Help needed moving to and from a bed to a chair (including a wheelchair)?: A Lot Help needed standing up from a chair using your arms (e.g., wheelchair or bedside chair)?: A Lot Help needed to walk in hospital room?: A Lot Help needed climbing 3-5 steps with a railing? : Total 6 Click Score: 11    End of Session   Activity Tolerance: Patient tolerated treatment well;Patient limited by fatigue;Patient limited by pain Patient left: in bed;with call bell/phone within reach;with family/visitor present Nurse Communication: Mobility status PT Visit Diagnosis: Unsteadiness on feet (R26.81);Other abnormalities of gait and mobility (R26.89);Muscle weakness (generalized) (M62.81)    Time: 2500-3704 PT Time Calculation (min) (ACUTE ONLY): 30 min   Charges:   PT Evaluation $PT Eval Moderate Complexity: 1 Mod PT Treatments $Therapeutic Activity: 23-37 mins        12:29 PM, 04/14/20 Lonell Grandchild, MPT Physical Therapist with [facility] [phone] office [phone] mobile phone

## 2020-04-14 NOTE — Discharge Summary (Signed)
Kristina Howell, is a 84 y.o. female  DOB [birth year]  MRN [id number].  Admission date:  04/08/2020  Admitting Physician  Mauricio Gerome Apley, MD  Discharge Date:  04/14/2020   Primary MD  Rosalee Kaufman, PA-C  Recommendations for primary care physician for things to follow:   1)Very low-salt diet advised 2)Weigh yourself daily, call if you gain more than 3 pounds in 1 day or more than 5 pounds in 1 week as your diuretic medications may need to be adjusted 3)Limit your Fluid  intake to no more than 60 ounces (1.8 Liters) per day 4)BMP and CBC every Monday Wednesday and Friday 5)Aranesp 60 mcg every other Monday starting Monday 04/21/20 for anemia related to chronic kidney disease 6) palliative care services to follow----may benefit from possible transition to hospice services if fails to improve in the near future 7)Avoid ibuprofen/Advil/Aleve/Motrin/Goody Powders/Naproxen/BC powders/Meloxicam/Diclofenac/Indomethacin and other Nonsteroidal anti-inflammatory medications as these will make you more likely to bleed and can cause stomach ulcers, can also cause Kidney problems.  8)insulin aspart (novoLOG) injection 0-10 Units  0-10 Units Subcutaneous, 3 times daily with meals  CBG < 70: Implement Hypoglycemia Standing Orders and refer to Hypoglycemia Standing Orders sidebar report   CBG 70 - 120: 0 unit CBG 121 - 150: 0 unit  CBG 151 - 200: 1 unit  CBG 201 - 250: 2 units  CBG 251 - 300: 4 units  CBG 301 - 350: 6 units   CBG 351 - 400: 8 units  CBG > 400: 10 units  Admission Diagnosis  Hypokalemia [E87.6] Hypomagnesemia [E83.42] Heart failure (HCC) [I50.9] Chronic kidney disease, unspecified CKD stage [N18.9] Acute congestive heart failure, unspecified heart failure type (HCC) [I50.9]   Discharge Diagnosis  Hypokalemia [E87.6] Hypomagnesemia [E83.42] Heart failure (HCC) [I50.9] Chronic kidney  disease, unspecified CKD stage [N18.9] Acute congestive heart failure, unspecified heart failure type ([place]) [I50.9]    Principal Problem:   Heart failure (HCC) Active Problems:   Persistent atrial fibrillation (HCC)   Coronary artery disease due to lipid rich plaque   Diabetes mellitus (HCC)   Mixed hyperlipidemia   Essential hypertension, benign   Obesity, Class III, BMI 40-49.9 (morbid obesity) ([place])   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 Atrial fibrillation ([place])   . CAD (coronary artery disease)    Multivessel, DES to LAD and RCA 08/2016  . Cardiomyopathy (HCC)    LVEF 45%  . CKD (chronic kidney disease) stage 5, GFR less than 15 ml/min (HCC)   . Essential hypertension   . Gout   . Pancreatitis, recurrent   . Type 2 diabetes mellitus ([place])     Past Surgical History:  Procedure Laterality Date  . CHOLECYSTECTOMY    . CORONARY STENT INTERVENTION N/A 09/08/2016  Procedure: Coronary Stent Intervention;  Surgeon: Belva Crome, MD;  Location: [facility];  Service: Cardiovascular;  Laterality: N/A;  . LEFT HEART CATH AND CORONARY ANGIOGRAPHY N/A 09/03/2016   Procedure: Left Heart Cath and Coronary Angiography;  Surgeon: Nelva Bush, MD;  Location: [facility];  Service: Cardiovascular;  Laterality: N/A;  . TONSILLECTOMY       HPI  from the history and physical done on the day of admission:     Patient coming from: home   Chief Complaint: dyspnea.   HPI: Kristina Howell is a 84 y.o. female with medical history significant of atrial fibrillation, coronary artery disease, diastolic heart failure, stage IV chronic kidney disease, hypertension, obesity and type 2 diabetes mellitus. Patient reports worsening lower extremity edema for  the last 4 months, over the last 10 days she has developed progressive and worsening dyspnea, to the point where she is symptomatic with minimal efforts like talking or going to the bathroom.  She has been sleeping in a recliner for long time, and she acknowledged orthopnea.    Dyspnea described as moderate to severe in intensity, worse with exertion, no frank improving factors, no associated chest pain or palpitations.  No wheezing or cough.  Positive weight gain 10 pounds over the last 7 days.  She has been seen as an outpatient by her cardiologist and her symptoms have been refractive to up titration of diuretic therapy with torsemide.   ED Course: Patient was found significantly hypervolemic, she has received 100 mg of intravenous furosemide and 40 mEq of potassium chloride along with 2 g of magnesium sulfate.  Referred for admission for evaluation.     Hospital Course:   Brief Narrative:  As per H&P written by Dr. Cathlean Sauer on 04/08/2020 Thereasa Parkin Scottonis a 84 y.o.femalewith medical history significant ofatrial fibrillation, coronary artery disease, diastolic heart failure, stageIV chronic kidney disease, hypertension, obesity and type 2 diabetes mellitus. Patient reports worsening lower extremity edema for the last 4 months, over the last 10 days she has developed progressive and worsening dyspnea, to the point where she is symptomatic with minimal effortsliketalking or going to the bathroom. She has been sleeping in a recliner for long time, andshe acknowledged orthopnea.   Dyspnea described as moderate to severe in intensity, worse with exertion, no frank improving factors, no associated chest pain or palpitations. No wheezing or cough. Positive weight gain 10 pounds over the last 7 days.  She has been seen as an outpatient by her cardiologist and her symptoms have been refractive to up titration of diuretic therapy with torsemide.   ED Course:Patient was found  significantly hypervolemic, she has received 100 mg of intravenous furosemide and 40 mEq of potassium chloride along with 2 g of magnesium sulfate. Referred for admission for evaluation.  Assessment & Plan: 1-acute respiratory failure with hypoxia in the setting of acute on chronic diastolic heart failure/HFpEF -Repeated echocardiogram: Demonstrating diastolic dysfunction with ejection fraction 60 to 65%.  See results below. (Previous results demonstrating combined CHF).  No wall motion abnormalities appreciated. -Continue low-sodium diet, daily weights, and fluid restrictions -Cardiology service consultation appreciated;  -Discharge weight is down to 264.4 on 04/14/20 pounds from 283lb -Treated with IV Lasix and p.o. metolazone -By cardiology recommendations discharge on torsemide 100 mg twice daily along with metolazone 2.5 mg Monday Wednesdays and Fridays --Volume status improving --Hypoxia has Not resolved -Discharged on 2 L of oxygen via nasal cannula especially with activity -Continue metoprolol -Patient has once again  emphasized no hemodialysis intervention. -Palliative care consult appreciated, overall prognosis is poor, consider transition to hospice if fails to improve  2-Persistent atrial fibrillation (HCC) -Continue metoprolol for rate control -Chronically on apixaban for stroke prophylaxis -Hemoglobin drifting down, no bleeding noted, -Repeat CBC as advised   3-hyperlipidemia -Continue statins  4-Coronary artery disease due to lipid rich plaque -Continue metoprolol and Crestor -Patient denies chest pain. -Stop aspirin due to hemoglobin drifting down and patient is already on Eliquis with high risk for bleeding  5-type II diabetes mellitus ([place]) with nephropathy -Continue sliding scale insulin. -Follow CBG  6-Essential hypertension, benign -Stable, continue metolazone, torsemide, amlodipine and metoprolol  7-Obesity, Class III, BMI 40-49.9 (morbid obesity)  (HCC) -Body mass index is 45.16 kg/m. -low calorie and portion control discussed with patient  8-acute on CKD stage 5 due to CHF and further progression of renal disease -continue to closely follow renal function trend -continue current diuretic regimen--creatinine remains around 3 -patient has declined HD -palliative care consulted   9-DNR -DNR/DNI -palliative care consulted.  -Palliative care consult appreciated, overall prognosis is poor, consider transition to hospice if fails to improve  10)Hypokalemia--replace and recheck, magnesium WNL -Discharge on potassium supplementation -BMP Mondays Wednesdays and Fridays while on high-dose diuretics  11) acute on chronic anemia of CKD--- H&H trended down, patient is on Eliquis and aspirin, no bleeding noted -Patient received Aranesp x1 dose on 04/14/2020 -Recommend aranesp every other week to keep hemoglobin close to 9 especially given CKD, CAD and CHF  12)acute hypoxic respiratory failure secondary to CHF exacerbation-- -hypoxia has improved but not resolved -Discharged on 2 L of oxygen via nasal cannula especially with activity   DVT prophylaxis: Chronically on apixaban. Code Status: DNR/DNI Family Communication: Previously discussed with son, l Disposition:  SNF rehab  Dispo: The [facility] is from: [name] ([facility] living facility).  Anticipated d/c is to: SNF rehab     Consultants:   Cardiology service  Palliative care  Discharge Condition: Overall prognosis is poor, patient is medically improved  Follow UP--outpatient palliative care follow-up, outpatient cardiology follow-up  Diet and Activity recommendation:  As advised  Discharge Instructions   * Discharge Instructions    Amb Referral to Palliative Care   Complete by: As directed    Call MD for:  difficulty breathing, headache or visual disturbances   Complete by: As directed    Call MD for:  persistant dizziness or  light-headedness   Complete by: As directed    Call MD for:  persistant nausea and vomiting   Complete by: As directed    Call MD for:  redness, tenderness, or signs of infection (pain, swelling, redness, odor or green/yellow discharge around incision site)   Complete by: As directed    Call MD for:  severe uncontrolled pain   Complete by: As directed    Call MD for:  temperature >100.4   Complete by: As directed    Diet - low sodium heart healthy   Complete by: As directed    Diet Carb Modified   Complete by: As directed    Discharge instructions   Complete by: As directed    1)Very low-salt diet advised 2)Weigh yourself daily, call if you gain more than 3 pounds in 1 day or more than 5 pounds in 1 week as your diuretic medications may need to be adjusted 3)Limit your Fluid  intake to no more than 60 ounces (1.8 Liters) per day 4)BMP and CBC every Monday Wednesday and Friday 5)Aranesp 60  mcg every other Monday starting Monday 04/21/20 for anemia related to chronic kidney disease 6) palliative care services to follow----may benefit from possible transition to hospice services if fails to improve in the near future 7)Avoid ibuprofen/Advil/Aleve/Motrin/Goody Powders/Naproxen/BC powders/Meloxicam/Diclofenac/Indomethacin and other Nonsteroidal anti-inflammatory medications as these will make you more likely to bleed and can cause stomach ulcers, can also cause Kidney problems.  8)insulin aspart (novoLOG) injection 0-10 Units  0-10 Units Subcutaneous, 3 times daily with meals  CBG < 70: Implement Hypoglycemia Standing Orders and refer to Hypoglycemia Standing Orders sidebar report   CBG 70 - 120: 0 unit CBG 121 - 150: 0 unit  CBG 151 - 200: 1 unit  CBG 201 - 250: 2 units  CBG 251 - 300: 4 units  CBG 301 - 350: 6 units   CBG 351 - 400: 8 units  CBG > 400: 10 units   Increase activity slowly   Complete by: As directed         Discharge Medications     Allergies as of 04/14/2020     No Known Allergies     Medication List    STOP taking these medications   aspirin EC 81 MG tablet   insulin lispro 100 UNIT/ML injection Commonly known as: HUMALOG   NovoLOG FlexPen 100 UNIT/ML FlexPen Generic drug: insulin aspart Replaced by: insulin aspart 100 UNIT/ML injection   Pen Needles 31G X 8 MM Misc     TAKE these medications   acetaminophen 500 MG tablet Commonly known as: TYLENOL Take 2 tablets (1,000 mg total) by mouth 3 (three) times daily. What changed:   how much to take  when to take this  reasons to take this   allopurinol 300 MG tablet Commonly known as: ZYLOPRIM Take 300 mg by mouth daily.   amLODipine 5 MG tablet Commonly known as: NORVASC Take 1 tablet (5 mg total) by mouth daily.   Darbepoetin Alfa 100 MCG/0.5ML Sosy injection Commonly known as: ARANESP Inject 0.5 mLs (100 mcg total) into the skin once for 1 dose.   Eliquis 2.5 MG Tabs tablet Generic drug: apixaban TAKE 1 TABLET(2.5 MG) BY MOUTH TWICE DAILY What changed: See the new instructions.   insulin aspart 100 UNIT/ML injection Commonly known as: novoLOG Inject 0-10 Units into the skin 3 (three) times daily with meals. insulin aspart (novoLOG) injection 0-10 Units  0-10 Units Subcutaneous, 3 times daily with meals  CBG < 70: Implement Hypoglycemia Standing Orders and refer to Hypoglycemia Standing Orders sidebar report   CBG 70 - 120: 0 unit CBG 121 - 150: 0 unit  CBG 151 - 200: 1 unit  CBG 201 - 250: 2 units  CBG 251 - 300: 4 units  CBG 301 - 350: 6 units   CBG 351 - 400: 8 units  CBG > 400: 10 units Replaces: NovoLOG FlexPen 100 UNIT/ML FlexPen   loperamide 2 MG capsule Commonly known as: IMODIUM Take 2 mg by mouth daily as needed for diarrhea or loose stools.   metolazone 2.5 MG tablet Commonly known as: ZAROXOLYN Take 1 tablet (2.5 mg total) by mouth every Monday, Wednesday, and Friday.   metoprolol succinate 50 MG 24 hr tablet Commonly known as:  TOPROL-XL Take 1 tablet (50 mg total) by mouth at bedtime.   ondansetron 4 MG tablet Commonly known as: ZOFRAN Take 1 tablet (4 mg total) by mouth every 6 (six) hours as needed for nausea.   potassium chloride 10 MEQ tablet Commonly known as: KLOR-CON Take  2 tablets (20 mEq total) by mouth daily. What changed: how much to take   rosuvastatin 20 MG tablet Commonly known as: CRESTOR Take 1 tablet (20 mg total) by mouth daily.   sodium bicarbonate 650 MG tablet Take 650 mg by mouth 3 (three) times daily.   torsemide 20 MG tablet Commonly known as: DEMADEX Take 5 tablets (100 mg total) by mouth 2 (two) times daily. What changed:   when to take this  additional instructions   Toujeo SoloStar 300 UNIT/ML Solostar Pen Generic drug: insulin glargine (1 Unit Dial) INJECT 60 UNITS INTO THE SKIN AT BEDTIME What changed: See the new instructions.   traMADol 50 MG tablet Commonly known as: ULTRAM Take 1 tablet (50 mg total) by mouth every 6 (six) hours as needed for moderate pain.   Vitamin D 50 MCG (2000 UT) Caps Take 1 capsule (2,000 Units total) by mouth daily.            Durable Medical Equipment  (From admission, onward)         Start     Ordered   04/14/20 0955  For home use only DME oxygen  Once       Comments: SATURATION QUALIFICATIONS: (Thisnote is usedto comply with regulatory documentation for home oxygen)  Patient Saturations on Room Air at Rest =90%  Patient Saturations on Room Air while Ambulating =87 %  Patient Saturations on2Liters of oxygen while Ambulating = 92%     Patient needs continuous O2 at 2 L/min continuously via nasal cannula with humidifier, with gaseous portability and conserving device    Diagnosis--congestive heart failure  Question Answer Comment  Length of Need Lifetime   Mode or (Route) Nasal cannula   Liters per Minute 2   Frequency Continuous (stationary and portable oxygen unit needed)   Oxygen conserving device  Yes   Oxygen delivery system Gas      04/14/20 0955          Major procedures and Radiology Reports - PLEASE review detailed and final reports for all details, in brief -     DG Chest Portable 1 View  Result Date: 04/08/2020 CLINICAL DATA:  Shortness of breath EXAM: PORTABLE CHEST 1 VIEW COMPARISON:  August 29, 2016 FINDINGS: The cardiomediastinal silhouette is unchanged and enlarged in contour.Atherosclerotic calcifications of the aorta. Small bilateral pleural effusions. No pneumothorax. Diffuse interstitial prominence. Bibasilar homogeneous opacities likely atelectasis. Visualized abdomen is unremarkable. Multilevel degenerative changes of the thoracic spine. IMPRESSION: 1. Small bilateral pleural effusions with bibasilar atelectasis. 2. Diffuse interstitial prominence likely reflects pulmonary edema. Electronically Signed   By: Valentino Saxon MD   On: 04/08/2020 17:05   ECHOCARDIOGRAM COMPLETE  Result Date: 04/09/2020    ECHOCARDIOGRAM REPORT   Patient Name:   ALEXANDRYA CHIM Date of Exam: 04/09/2020 Medical Rec #:  [id number]       Height:       65.0 in Accession #:    [id number]      Weight:       283.3 lb Date of Birth:  01/05/1936        BSA:          2.294 m Patient Age:    11 years        BP:           149/67 mmHg Patient Gender: F               HR:  87 bpm. Exam Location:  [facility] Procedure: 2D Echo, Cardiac Doppler and Color Doppler Indications:    Dyspnea 786.09 / R06.00  History:        Patient has prior history of Echocardiogram examinations, most                 recent 08/30/2016. Arrythmias:Atrial Fibrillation; Risk                 Factors:Hypertension and Diabetes.  Sonographer:    Leavy Cella RDCS (AE) Referring Phys: [id number] [place]  1. Left ventricular ejection fraction, by estimation, is 60 to 65%. The left ventricle has normal function. The left ventricle has no regional wall motion abnormalities. There is mild left ventricular  hypertrophy. Left ventricular diastolic parameters are indeterminate in the setting of atrial fibrillation.  2. Right ventricular systolic function is mildly to moderately reduced. The right ventricular size is normal. Tricuspid regurgitation signal is inadequate for assessing PA pressure.  3. Left atrial size was mildly dilated.  4. The mitral valve is abnormal. Mild mitral valve regurgitation. Moderate mitral annular calcification.  5. The aortic valve is tricuspid. There is moderate calcification of the aortic valve. Aortic valve regurgitation is not visualized. Aortic valve mean gradient measures 12.3 mmHg.  6. The inferior vena cava is dilated in size with >50% respiratory variability, suggesting right atrial pressure of 8 mmHg. FINDINGS  Left Ventricle: Left ventricular ejection fraction, by estimation, is 60 to 65%. The left ventricle has normal function. The left ventricle has no regional wall motion abnormalities. The left ventricular internal cavity size was normal in size. There is  mild left ventricular hypertrophy. Left ventricular diastolic parameters are indeterminate. Right Ventricle: The right ventricular size is normal. No increase in right ventricular wall thickness. Right ventricular systolic function is moderately reduced. Tricuspid regurgitation signal is inadequate for assessing PA pressure. Left Atrium: Left atrial size was mildly dilated. Right Atrium: Right atrial size was normal in size. Pericardium: There is no evidence of pericardial effusion. Mitral Valve: The mitral valve is abnormal. Moderate mitral annular calcification. Mild mitral valve regurgitation. Tricuspid Valve: The tricuspid valve is grossly normal. Tricuspid valve regurgitation is mild. Aortic Valve: The aortic valve is tricuspid. There is moderate calcification of the aortic valve. There is mild to moderate aortic valve annular calcification. Aortic valve regurgitation is not visualized. Aortic valve mean gradient measures  12.3 mmHg. Aortic valve peak gradient measures 19.7 mmHg. Aortic valve area, by VTI measures 0.75 cm. Pulmonic Valve: The pulmonic valve was grossly normal. Pulmonic valve regurgitation is trivial. Aorta: The aortic root is normal in size and structure. Venous: The inferior vena cava is dilated in size with greater than 50% respiratory variability, suggesting right atrial pressure of 8 mmHg. IAS/Shunts: No atrial level shunt detected by color flow Doppler. Additional Comments: There is pleural effusion in the left lateral region.  LEFT VENTRICLE PLAX 2D LVIDd:         4.16 cm  Diastology LVIDs:         2.60 cm  LV e' medial:    8.38 cm/s LV PW:         1.28 cm  LV E/e' medial:  16.0 LV IVS:        1.15 cm  LV e' lateral:   7.51 cm/s LVOT diam:     1.80 cm  LV E/e' lateral: 17.8 LV SV:         38 LV SV Index:   17  LVOT Area:     2.54 cm  RIGHT VENTRICLE RV S prime:     9.57 cm/s LEFT ATRIUM              Index       RIGHT ATRIUM           Index LA diam:        5.10 cm  2.22 cm/m  RA Area:     17.30 cm LA Vol (A2C):   111.0 ml 48.40 ml/m RA Volume:   49.70 ml  21.67 ml/m LA Vol (A4C):   55.0 ml  23.98 ml/m LA Biplane Vol: 79.0 ml  34.44 ml/m  AORTIC VALVE AV Area (Vmax):    0.77 cm AV Area (Vmean):   0.70 cm AV Area (VTI):     0.75 cm AV Vmax:           222.00 cm/s AV Vmean:          167.667 cm/s AV VTI:            0.507 m AV Peak Grad:      19.7 mmHg AV Mean Grad:      12.3 mmHg LVOT Vmax:         67.23 cm/s LVOT Vmean:        46.400 cm/s LVOT VTI:          0.149 m LVOT/AV VTI ratio: 0.29  AORTA Ao Root diam: 2.30 cm MITRAL VALVE MV Area (PHT): 3.75 cm     SHUNTS MV Decel Time: 202 msec     Systemic VTI:  0.15 m MR Peak grad: 109.4 mmHg    Systemic Diam: 1.80 cm MR Mean grad: 78.0 mmHg MR Vmax:      523.00 cm/s MR Vmean:     421.0 cm/s MV E velocity: 134.00 cm/s Rozann Lesches MD Electronically signed by Rozann Lesches MD Signature Date/Time: 04/09/2020/12:59:54 PM    Final     Micro Results   Recent  Results (from the past 240 hour(s))  SARS Coronavirus 2 by RT PCR (hospital order, performed in [facility] hospital lab) Nasopharyngeal Nasopharyngeal Swab     Status: None   Collection Time: 04/08/20  4:03 PM   Specimen: Nasopharyngeal Swab  Result Value Ref Range Status   SARS Coronavirus 2 NEGATIVE NEGATIVE Final    Comment: (NOTE) SARS-CoV-2 target nucleic acids are NOT DETECTED.  The SARS-CoV-2 RNA is generally detectable in upper and lower respiratory specimens during the acute phase of infection. The lowest concentration of SARS-CoV-2 viral copies this assay can detect is 250 copies / mL. A negative result does not preclude SARS-CoV-2 infection and should not be used as the sole basis for treatment or other patient management decisions.  A negative result may occur with improper specimen collection / handling, submission of specimen other than nasopharyngeal swab, presence of viral mutation(s) within the areas targeted by this assay, and inadequate number of viral copies (<250 copies / mL). A negative result must be combined with clinical observations, patient history, and epidemiological information.  Fact Sheet for Patients:   StrictlyIdeas.no  Fact Sheet for Healthcare Providers: BankingDealers.co.za  This test is not yet approved or  cleared by the [place] FDA and has been authorized for detection and/or diagnosis of SARS-CoV-2 by FDA under an Emergency Use Authorization (EUA).  This EUA will remain in effect (meaning this test can be used) for the duration of the COVID-19 declaration under Section 564(b)(1) of the Act, 21 U.S.C.  section 360bbb-3(b)(1), unless the authorization is terminated or revoked sooner.  Performed at [facility], [address], [place], [place] [address]   Respiratory Panel by RT PCR (Flu A&B, Covid) - Nasopharyngeal Swab     Status: None   Collection Time: 04/14/20 11:43 AM   Specimen:  Nasopharyngeal Swab  Result Value Ref Range Status   SARS Coronavirus 2 by RT PCR NEGATIVE NEGATIVE Final    Comment: (NOTE) SARS-CoV-2 target nucleic acids are NOT DETECTED.  The SARS-CoV-2 RNA is generally detectable in upper respiratoy specimens during the acute phase of infection. The lowest concentration of SARS-CoV-2 viral copies this assay can detect is 131 copies/mL. A negative result does not preclude SARS-Cov-2 infection and should not be used as the sole basis for treatment or other patient management decisions. A negative result may occur with  improper specimen collection/handling, submission of specimen other than nasopharyngeal swab, presence of viral mutation(s) within the areas targeted by this assay, and inadequate number of viral copies (<131 copies/mL). A negative result must be combined with clinical observations, patient history, and epidemiological information. The expected result is Negative.  Fact Sheet for Patients:  PinkCheek.be  Fact Sheet for Healthcare Providers:  GravelBags.it  This test is no t yet approved or cleared by the [place] FDA and  has been authorized for detection and/or diagnosis of SARS-CoV-2 by FDA under an Emergency Use Authorization (EUA). This EUA will remain  in effect (meaning this test can be used) for the duration of the COVID-19 declaration under Section 564(b)(1) of the Act, 21 U.S.C. section 360bbb-3(b)(1), unless the authorization is terminated or revoked sooner.     Influenza A by PCR NEGATIVE NEGATIVE Final   Influenza B by PCR NEGATIVE NEGATIVE Final    Comment: (NOTE) The Xpert Xpress SARS-CoV-2/FLU/RSV assay is intended as an aid in  the diagnosis of influenza from Nasopharyngeal swab specimens and  should not be used as a sole basis for treatment. Nasal washings and  aspirates are unacceptable for Xpert Xpress SARS-CoV-2/FLU/RSV  testing.  Fact Sheet  for Patients: PinkCheek.be  Fact Sheet for Healthcare Providers: GravelBags.it  This test is not yet approved or cleared by the [place] FDA and  has been authorized for detection and/or diagnosis of SARS-CoV-2 by  FDA under an Emergency Use Authorization (EUA). This EUA will remain  in effect (meaning this test can be used) for the duration of the  Covid-19 declaration under Section 564(b)(1) of the Act, 21  U.S.C. section 360bbb-3(b)(1), unless the authorization is  terminated or revoked. Performed at [facility], [address], [place], [place] [address]        Today   [place] today has no new complaints, -Generalized weakness and dyspnea on exertion persist -Hypoxia at rest mostly resolved with minimal activity mild hypoxia noted       Patient has been seen and examined prior to discharge   Objective   Blood pressure (!) 153/59, pulse 79, temperature 98.1 F (36.7 C), temperature source Oral, resp. rate 19, height 5\' 5"  (1.651 m), weight 119.9 kg, SpO2 93 %.   Intake/Output Summary (Last 24 hours) at 04/14/2020 1537 Last data filed at 04/14/2020 1412 Gross per 24 hour  Intake 480 ml  Output 2000 ml  Net -1520 ml    Exam Gen:- Awake Alert, no acute distress, no conversational dyspnea HEENT:- [place].AT, No sclera icterus Nose- [place] 2L/min Neck-Supple Neck,No JVD,.  Lungs-diminished in bases, no wheezing  CV-  S1, S2 normal, irregular Abd-  +ve B.Sounds, Abd Soft, No tenderness, increased truncal adiposity Psych-affect is appropriate, oriented x3 Central nervous system: Generalized weakness, alert and oriented. No focal neurological deficits. Extremities:  2+ edema bilaterally, chronic venous stasis type dermatitis changes, Rt lower medial thigh area with healing old wound Skin: No petechiae; positive chronic stasis dermatitis and lymphedema changes bilaterally.   Data Review   CBC  w Diff:  Lab Results  Component Value Date   WBC 11.1 (H) 04/14/2020   HGB 7.2 (L) 04/14/2020   HCT 25.5 (L) 04/14/2020   PLT 347 04/14/2020   LYMPHOPCT 15 01/30/2020   MONOPCT 7 01/30/2020   EOSPCT 8 01/30/2020   BASOPCT 0 01/30/2020    CMP:  Lab Results  Component Value Date   NA 133 (L) 04/14/2020   K 3.8 04/14/2020   CL 92 (L) 04/14/2020   CO2 29 04/14/2020   BUN 86 (H) 04/14/2020   BUN 61 (A) 05/10/2018   CREATININE 3.10 (H) 04/14/2020   CREATININE 2.57 (H) 10/05/2017   PROT 6.4 (L) 04/08/2020   ALBUMIN 2.0 (L) 04/08/2020   BILITOT 0.4 04/08/2020   ALKPHOS 153 (H) 04/08/2020   AST 36 04/08/2020   ALT 37 04/08/2020  .   Total Discharge time is about 33 minutes  Roxan Hockey M.D on 04/14/2020 at 3:37 PM  Go to [email or web address] -  for contact info  [facility] - Office  [phone]

## 2020-04-14 NOTE — Progress Notes (Signed)
Palliative:  HPI: 84 y.o. female  with past medical history of atrial fibrillation, CAD, diastolic heart failure, stage IV CKD, chronic lymphedema, hypertension, obesity, type 2 DM admitted on 04/08/2020 with shortness of breath and BLE swelling. Hospital admission for acute decompensation of diastolic heart failure, pulmonary edema, chronic and persistent atrial fibrillation, and AKI with underlying CKD. Pending cardiology consultation. Receiving aggressive IV diuresis. Palliative medicine consultation for goals of care.   I met today at Kristina Howell's bedside with her and her son present. She complains of being incredibly weak and fatigued. We discussed her heart failure and renal failure and how this complicates how well we will be able to manage and reverse her declining status. She does report that she is pleased that her swelling has improved and her breathing has improved however she continues to have poor quality of life. We spent time discussing that we are limited on how to fix this. She is hopeful for trial of rehab. We did discuss what we should do if she is unable to get stronger and improve past where she is currently and she does indicate that this is not a good quality of life for her. We did discuss that with further decline or lack of improvement she could consider the option of a more comfort focused path with goals to manage her symptoms to help her feel as good as possible but without aggressive management with repeat hospitalization and labs and diagnostics. Son is nodding his head and understands. Ms. Stevison understands as well. She wishes to take one day at a time and see how things progress for her.   All questions/concerns addressed. Emotional support provided. Discussed with Dr. Denton Brick.   Exam: Alert, fatigued, weak. No distress. Pallor. Breathing unlabored at rest. Abd soft. BLE with edema worse above knee and improved below knees. Warm to touch.   Plan: - Pain: Limited due to  fatigue already. She wishes to avoid sedating medications. Recommend addition of Tylenol 1000 TID. Continue with prn Tramadol.  - Recommend outpatient palliative to follow. I worry that she is high risk for further decline and options limited to provide any significant improvement. Anticipate will need further discussion to prepare for likely need for hospice in the near future.   25 min  Vinie Sill, NP Palliative Medicine Team Pager [phone] (Please see [email or web address] for schedule) Team Phone [phone]    Greater than 50%  of this time was spent counseling and coordinating care related to the above assessment and plan

## 2020-04-14 NOTE — NC FL2 (Signed)
[place] MEDICAID FL2 LEVEL OF CARE SCREENING TOOL     IDENTIFICATION  Patient Name: Kristina Howell Birthdate: [birth year] Sex: female Admission Date (Current Location): 04/08/2020  [facility] and [place] Number:  [employer] and Address:  [place] [address], [place]      Provider Number: [id number]  Attending Physician Name and Address:  Roxan Hockey, MD  Relative Name and Phone Number:       Current Level of Care: Hospital Recommended Level of Care: [place] Prior Approval Number:    Date Approved/Denied:   PASRR Number: [id number]  Discharge Plan: SNF (ALF)    Current Diagnoses: Patient Active Problem List   Diagnosis Date Noted  . Chronic kidney disease   . Palliative care by specialist   . Goals of care, counseling/discussion   . DNR (do not resuscitate)   . Heart failure ([place]) 04/08/2020  . Obesity, Class III, BMI 40-49.9 (morbid obesity) ([place]) 04/08/2020  . CKD stage 5 due to type 2 diabetes mellitus ([place]) 04/08/2020  . Acquired lymphedema of lower extremity 04/08/2020  . Type 2 diabetes mellitus with stage 4 chronic kidney disease, with long-term current use of insulin ([place]) 08/30/2017  . Diabetes mellitus ([place]) 08/30/2017  . Mixed hyperlipidemia 08/30/2017  . Essential hypertension, benign 08/30/2017  . Femoral artery hematoma complicating cardiac catheterization 09/15/2016  . Status post coronary artery stent placement   . Difficulty in walking, not elsewhere classified   . Coronary artery disease involving native coronary artery of native heart with unstable angina pectoris ([place])   . Coronary artery disease due to lipid rich plaque   . Chronic anticoagulation   . Acute systolic heart failure ([place])   . Unstable angina ([place])   . Type 2 diabetes mellitus with diabetic foot ulcer ([place]) 08/30/2016  . Acute kidney injury ([place]) 08/30/2016  . Dyspnea 08/30/2016  . Peripheral edema   . Persistent  atrial fibrillation ([place])   . Morbidly obese (HCC)     Orientation RESPIRATION BLADDER Height & Weight     Self, Time, Situation, Place  O2 (2 L) Incontinent Weight: 264 lb 6.4 oz (119.9 kg) Height:  5\' 5"  (165.1 cm)  BEHAVIORAL SYMPTOMS/MOOD NEUROLOGICAL BOWEL NUTRITION STATUS      Incontinent Diet (Heart healthy/carb modified. See d/c summary for updates.)  AMBULATORY STATUS COMMUNICATION OF NEEDS Skin   Total Care Verbally Other (Comment) (Bilateral cellulitis and blisters to legs)                       Personal Care Assistance Level of Assistance  Bathing, Feeding, Dressing Bathing Assistance: Maximum assistance Feeding assistance: Limited assistance Dressing Assistance: Maximum assistance     Functional Limitations Info  Sight, Hearing, Speech Sight Info: Impaired Hearing Info: Adequate Speech Info: Adequate    SPECIAL CARE FACTORS FREQUENCY  PT (By licensed PT)     PT Frequency: daily              Contractures      Additional Factors Info  Code Status, Allergies Code Status Info: DNR Allergies Info: No known allergies           Current Medications (04/14/2020):  This is the current hospital active medication list Current Facility-Administered Medications  Medication Dose Route Frequency Provider Last Rate Last Admin  . acetaminophen (TYLENOL) tablet 650 mg  650 mg Oral Q6H PRN Arrien, Jimmy Picket, MD       Or  .  acetaminophen (TYLENOL) suppository 650 mg  650 mg Rectal Q6H PRN Arrien, Jimmy Picket, MD      . allopurinol (ZYLOPRIM) tablet 300 mg  300 mg Oral Daily Arrien, Jimmy Picket, MD   300 mg at 04/14/20 0941  . apixaban (ELIQUIS) tablet 2.5 mg  2.5 mg Oral BID Arrien, Jimmy Picket, MD   2.5 mg at 04/14/20 0941  . aspirin EC tablet 81 mg  81 mg Oral Daily Arrien, Jimmy Picket, MD   81 mg at 04/14/20 0941  . cholecalciferol (VITAMIN D3) tablet 2,000 Units  2,000 Units Oral Daily Arrien, Jimmy Picket, MD   2,000 Units at  04/14/20 0940  . furosemide (LASIX) injection 60 mg  60 mg Intravenous Q12H Roxan Hockey, MD   60 mg at 04/14/20 0609  . insulin aspart (novoLOG) injection 0-6 Units  0-6 Units Subcutaneous TID WC Arrien, Jimmy Picket, MD   2 Units at 04/14/20 [phone]  . metolazone (ZAROXOLYN) tablet 2.5 mg  2.5 mg Oral Q T,Th,S,Su Emokpae, Courage, MD   2.5 mg at 04/13/20 0854  . metoprolol succinate (TOPROL-XL) 24 hr tablet 50 mg  50 mg Oral QHS Arrien, Jimmy Picket, MD   50 mg at 04/13/20 2213  . ondansetron (ZOFRAN) tablet 4 mg  4 mg Oral Q6H PRN Arrien, Jimmy Picket, MD       Or  . ondansetron [facility]) injection 4 mg  4 mg Intravenous Q6H PRN Arrien, Jimmy Picket, MD      . rosuvastatin (CRESTOR) tablet 20 mg  20 mg Oral Daily Arrien, Jimmy Picket, MD   20 mg at 04/14/20 0941  . traMADol (ULTRAM) tablet 50 mg  50 mg Oral Q6H PRN Arrien, Jimmy Picket, MD   50 mg at 04/14/20 9604     Discharge Medications: Please see discharge summary for a list of discharge medications.  Relevant Imaging Results:  Relevant Lab Results:   Additional Information Daily weights. SSN: [id number]. Moderna Vaccine dates: 10/16/19 and 11/20/19 per son.  Salome Arnt, LCSW

## 2020-04-14 NOTE — Care Management Important Message (Signed)
Important Message  Patient Details  Name: Kristina Howell MRN: [id number] Date of Birth: Oct 19, 1935   Medicare Important Message Given:  Yes     Tommy Medal 04/14/2020, 11:17 AM

## 2020-04-14 NOTE — TOC Transition Note (Signed)
Transition of Care [facility]) - CM/SW Discharge Note   Patient Details  Name: Kristina Howell MRN: [id number] Date of Birth: [birth year]  Transition of Care [facility]) CM/SW Contact:  Salome Arnt, LCSW Phone Number: 04/14/2020, 3:32 PM   Clinical Narrative:   PT evaluated pt this morning and recommend SNF. LCSW discussed with Kristina Howell and pt's son, Kristina Howell who are in agreement. Bed offers provided and [facility] selected which was pt's preference. LCSW started HTA auth and received this afternoon (auth: [id number], approved for 4 days). Kerri at [facility] aware and facility is ready to accept pt today. RN notified to call report. COVID negative 04/14/20. Pt will transfer with [facility] staff. Kristina Howell updated.     Final next level of care: Skilled Nursing Facility Barriers to Discharge: Barriers Resolved   Patient Goals and CMS Choice Patient states their goals for this hospitalization and ongoing recovery are:: SNF for rehab   Choice offered to / list presented to : Patient, Adult Children  Discharge Placement                Patient to be transferred to facility by: [facility] staff Name of family member notified: Kristina Howell- son Patient and family notified of of transfer: 04/14/20  Discharge Plan and Services In-house Referral: Clinical Social Work                                   Social Determinants of Health ([place]) Interventions     Readmission Risk Interventions No flowsheet data found.

## 2020-04-14 NOTE — Progress Notes (Signed)
Progress Note  Patient Name: Jalaysia Lobb Date of Encounter: 04/14/2020  [facility] Cardiologist: Rozann Lesches, MD   Subjective   SOB improving but not resolved  Inpatient Medications    Scheduled Meds: . allopurinol  300 mg Oral Daily  . apixaban  2.5 mg Oral BID  . aspirin EC  81 mg Oral Daily  . cholecalciferol  2,000 Units Oral Daily  . furosemide  60 mg Intravenous Q12H  . insulin aspart  0-6 Units Subcutaneous TID WC  . metolazone  2.5 mg Oral Q T,Th,S,Su  . metoprolol succinate  50 mg Oral QHS  . rosuvastatin  20 mg Oral Daily   Continuous Infusions:  PRN Meds: acetaminophen **OR** acetaminophen, ondansetron **OR** ondansetron (ZOFRAN) IV, traMADol   Vital Signs    Vitals:   04/13/20 2200 04/14/20 0500 04/14/20 0607 04/14/20 0900  BP: (!) 146/50  (!) 144/53   Pulse: 77  73   Resp: 15  15   Temp: 98.4 F (36.9 C)  98 F (36.7 C)   TempSrc: Oral  Oral   SpO2: 100%  100%   Weight:  122 kg  119.9 kg  Height:        Intake/Output Summary (Last 24 hours) at 04/14/2020 1000 Last data filed at 04/14/2020 0600 Gross per 24 hour  Intake 240 ml  Output 2901 ml  Net -2661 ml   Last 3 Weights 04/14/2020 04/14/2020 04/13/2020  Weight (lbs) 264 lb 6.4 oz 268 lb 15.4 oz 271 lb 6.2 oz  Weight (kg) 119.931 kg 122 kg 123.1 kg      Telemetry    Rate controlled afib - Personally Reviewed  ECG    n/a - Personally Reviewed  Physical Exam   GEN: No acute distress.   Neck: eleved JVD Cardiac: irreg, 2/6 systolic murmur apex Respiratory: decreased breath sounds bilateral bases GI: Soft, nontender, non-distended  MS: 1+ bialteral LE edema; No deformity. Neuro:  Nonfocal  Psych: Normal affect   Labs    High Sensitivity Troponin:  No results for input(s): TROPONINIHS in the last 720 hours.    Chemistry Recent Labs  Lab 04/08/20 1631 04/09/20 0603 04/12/20 0800 04/13/20 0751 04/14/20 0637  NA 140   < > 135 132* 133*  K 2.9*   < > 2.4* 3.2* 3.8   CL 100   < > 90* 92* 92*  CO2 24   < > 30 29 29   GLUCOSE 185*   < > 182* 190* 201*  BUN 60*   < > 74* 78* 86*  CREATININE 2.96*   < > 2.82* 2.97* 3.10*  CALCIUM 8.6*   < > 8.9 8.8* 8.7*  PROT 6.4*  --   --   --   --   ALBUMIN 2.0*  --   --   --   --   AST 36  --   --   --   --   ALT 37  --   --   --   --   ALKPHOS 153*  --   --   --   --   BILITOT 0.4  --   --   --   --   GFRNONAA 14*   < > 15* 14* 13*  GFRAA 16*   < > 17* 16* 15*  ANIONGAP 16*   < > 15 11 12    < > = values in this interval not displayed.     Hematology Recent Labs  Lab 04/08/20 1631  04/09/20 0603 04/14/20 0637  WBC 10.2 12.4* 11.1*  RBC 2.96* 3.02* 2.70*  HGB 8.0* 8.1* 7.2*  HCT 28.1* 28.7* 25.5*  MCV 94.9 95.0 94.4  MCH 27.0 26.8 26.7  MCHC 28.5* 28.2* 28.2*  RDW 20.8* 20.7* 19.2*  PLT 361 396 347    BNP Recent Labs  Lab 04/08/20 1631  BNP 468.0*     DDimer No results for input(s): DDIMER in the last 168 hours.   Radiology    No results found.  Cardiac Studies     Patient Profile      84 y.o.femalew/ PMH of CAD (s/p DES to LAD and DES to RCA in 2018), chronic diastolic CHF, permanent atrial fibrillation, HTN, HLD, Type 2 DM and Stage 4-5 CKD who is currrntly admitted for an acute CHF exacerbation.  Assessment & Plan  1. Acute on chronic diastolic HF - She presented with worsening dyspnea and weight up 10-15 + lbs over her baseline - Echo this admit shows LVEF 60-65%, indet DDx, mild to mod RV dysfunction  - I/Os incomplete, neg roughly 2.6 L yesterday and 10 L since admission. If weights accurate admit 281-->today 264 lbs. She is on IV lasix 60mg  bid, metolzazone 2.5 mg T, Th, Sat,Sun. Mild uptrend in Cr. Diuretics decreased due to some increase in Cr, remains fluid overloaded.   -at home had been on torsemide 100mg  in AM and 60mg  in PM  - continue IV diuretics.   2. Permanent Atrial Fibrillation -Rates have overall been well controlled by review of telemetry. Continue  Toprol-XL 50 mg daily for rate control. -She denies any evidence of active bleeding. Remains on Eliquis 2.5 mg twice daily for anticoagulation(reduced dosing given age and renal function).  3. CAD - She iss/p DES to LAD and DES to RCA in 2018.She does have baseline dyspnea on exertion but denies any recent chest pain. Continue ASA, beta-blocker and statin therapy.  4. CKD IV/V - Followed by Dr. Theador Hawthorne as an outpatient - follow labs with diuresis - from notes patient has declined HD.   For questions or updates, please contact [place] Please consult [email or web address] for contact info under        Signed, Carlyle Dolly, MD  04/14/2020, 10:00 AM

## 2020-04-14 NOTE — Progress Notes (Signed)
   SATURATION QUALIFICATIONS: (Thisnote is usedto comply with regulatory documentation for home oxygen)  Patient Saturations on Room Air at Rest =90%  Patient Saturations on Room Air while Ambulating =87 %  Patient Saturations on2Liters of oxygen while Ambulating = 92%     Patient needs continuous O2 at 2 L/min continuously via nasal cannula with humidifier, with gaseous portability and conserving device    Diagnosis--congestive heart failure

## 2020-04-14 NOTE — Plan of Care (Signed)
  Problem: Acute Rehab PT Goals(only PT should resolve) Goal: Pt Will Go Supine/Side To Sit Outcome: Progressing Flowsheets (Taken 04/14/2020 1231) Pt will go Supine/Side to Sit: with minimal assist Goal: Patient Will Transfer Sit To/From Stand Outcome: Progressing Flowsheets (Taken 04/14/2020 1231) Patient will transfer sit to/from stand: with minimal assist Goal: Pt Will Transfer Bed To Chair/Chair To Bed Outcome: Progressing Flowsheets (Taken 04/14/2020 1231) Pt will Transfer Bed to Chair/Chair to Bed:  with min assist  with mod assist Goal: Pt Will Ambulate Outcome: Progressing Flowsheets (Taken 04/14/2020 1231) Pt will Ambulate:  with moderate assist  with minimal assist  25 feet  with rolling walker   12:31 PM, 04/14/20 Lonell Grandchild, MPT Physical Therapist with [facility] [phone] office [phone] mobile phone

## 2020-04-15 ENCOUNTER — Encounter: Payer: Self-pay | Admitting: Adult Health

## 2020-04-15 ENCOUNTER — Other Ambulatory Visit (HOSPITAL_COMMUNITY)
Admission: RE | Admit: 2020-04-15 | Discharge: 2020-04-15 | Disposition: A | Discharge status: Home / Self Care | Payer: PPO | Source: Other Acute Inpatient Hospital | Attending: Cardiology | Admitting: Cardiology

## 2020-04-15 ENCOUNTER — Non-Acute Institutional Stay (SKILLED_NURSING_FACILITY): Payer: PPO | Admitting: Adult Health

## 2020-04-15 ENCOUNTER — Telehealth: Payer: Self-pay

## 2020-04-15 DIAGNOSIS — I120 Hypertensive chronic kidney disease with stage 5 chronic kidney disease or end stage renal disease: Secondary | ICD-10-CM

## 2020-04-15 DIAGNOSIS — M1A39X0 Chronic gout due to renal impairment, multiple sites, without tophus (tophi): Secondary | ICD-10-CM | POA: Diagnosis not present

## 2020-04-15 DIAGNOSIS — E785 Hyperlipidemia, unspecified: Secondary | ICD-10-CM | POA: Insufficient documentation

## 2020-04-15 DIAGNOSIS — I4819 Other persistent atrial fibrillation: Secondary | ICD-10-CM | POA: Diagnosis not present

## 2020-04-15 DIAGNOSIS — I5033 Acute on chronic diastolic (congestive) heart failure: Secondary | ICD-10-CM

## 2020-04-15 DIAGNOSIS — Z79899 Other long term (current) drug therapy: Secondary | ICD-10-CM

## 2020-04-15 DIAGNOSIS — J9601 Acute respiratory failure with hypoxia: Secondary | ICD-10-CM

## 2020-04-15 DIAGNOSIS — E1169 Type 2 diabetes mellitus with other specified complication: Secondary | ICD-10-CM | POA: Diagnosis not present

## 2020-04-15 DIAGNOSIS — D631 Anemia in chronic kidney disease: Secondary | ICD-10-CM

## 2020-04-15 DIAGNOSIS — I890 Lymphedema, not elsewhere classified: Secondary | ICD-10-CM | POA: Diagnosis not present

## 2020-04-15 DIAGNOSIS — M1039 Gout due to renal impairment, multiple sites: Secondary | ICD-10-CM | POA: Insufficient documentation

## 2020-04-15 DIAGNOSIS — Z794 Long term (current) use of insulin: Secondary | ICD-10-CM

## 2020-04-15 DIAGNOSIS — I25110 Atherosclerotic heart disease of native coronary artery with unstable angina pectoris: Secondary | ICD-10-CM

## 2020-04-15 DIAGNOSIS — J9621 Acute and chronic respiratory failure with hypoxia: Secondary | ICD-10-CM | POA: Insufficient documentation

## 2020-04-15 DIAGNOSIS — E1122 Type 2 diabetes mellitus with diabetic chronic kidney disease: Secondary | ICD-10-CM | POA: Diagnosis not present

## 2020-04-15 DIAGNOSIS — N185 Chronic kidney disease, stage 5: Secondary | ICD-10-CM | POA: Diagnosis not present

## 2020-04-15 LAB — BASIC METABOLIC PANEL
Anion gap: 17 — ABNORMAL HIGH (ref 5–15)
BUN: 92 mg/dL — ABNORMAL HIGH (ref 8–23)
CO2: 26 mmol/L (ref 22–32)
Calcium: 9 mg/dL (ref 8.9–10.3)
Chloride: 87 mmol/L — ABNORMAL LOW (ref 98–111)
Creatinine, Ser: 3.39 mg/dL — ABNORMAL HIGH (ref 0.44–1.00)
GFR calc Af Amer: 14 mL/min — ABNORMAL LOW (ref >60)
GFR calc non Af Amer: 12 mL/min — ABNORMAL LOW (ref >60)
Glucose, Bld: 203 mg/dL — ABNORMAL HIGH (ref 70–99)
Potassium: 3.6 mmol/L (ref 3.5–5.1)
Sodium: 130 mmol/L — ABNORMAL LOW (ref 135–145)

## 2020-04-15 NOTE — Telephone Encounter (Signed)
BMET lab slip faxed to [facility]

## 2020-04-15 NOTE — Progress Notes (Signed)
Location:    [facility] Room Number: 135-P Place of Service:  SNF (31)   CODE STATUS: DNR  No Known Allergies  Chief Complaint  Patient presents with  . Hospitalization Follow-up    Follow-up from recent hospitalization     HPI:  She is a 84 year old woman who has been hospitalized from 04-08-20 through 04-14-20. She has had 4 months of worsening edema; 10 days prior to her hospitalization she became more short of breath and worsening weakness present. She was admitted for congestive heart failure; acute respiratory failure. She is here for short term rehab with her goal to return back to assisted living. She complains of lower extremity edema which has improved; does have some shortness of breath present and is complaining of weakness present. She will continue to be followed for her chronic illnesses including: afib heart failure obesity.   Past Medical History:  Diagnosis Date  . Atrial fibrillation ([place])   . CAD (coronary artery disease)    Multivessel, DES to LAD and RCA 08/2016  . Cardiomyopathy (HCC)    LVEF 45%  . CKD (chronic kidney disease) stage 5, GFR less than 15 ml/min (HCC)   . Essential hypertension   . Gout   . Pancreatitis, recurrent   . Type 2 diabetes mellitus ([place])     Past Surgical History:  Procedure Laterality Date  . CHOLECYSTECTOMY    . CORONARY STENT INTERVENTION N/A 09/08/2016   Procedure: Coronary Stent Intervention;  Surgeon: Belva Crome, MD;  Location: [facility];  Service: Cardiovascular;  Laterality: N/A;  . LEFT HEART CATH AND CORONARY ANGIOGRAPHY N/A 09/03/2016   Procedure: Left Heart Cath and Coronary Angiography;  Surgeon: Nelva Bush, MD;  Location: [facility];  Service: Cardiovascular;  Laterality: N/A;  . TONSILLECTOMY      Social History   Socioeconomic History  . Marital status: Widowed    Spouse name: Not on file  . Number of children: Not on file  . Years of education: Not on file  .  Highest education level: Not on file  Occupational History  . Not on file  Tobacco Use  . Smoking status: Never Smoker  . Smokeless tobacco: Never Used  Vaping Use  . Vaping Use: Never used  Substance and Sexual Activity  . Alcohol use: No  . Drug use: No  . Sexual activity: Not on file  Other Topics Concern  . Not on file  Social History Narrative  . Not on file   Social Determinants of Health   Financial Resource Strain:   . Difficulty of Paying Living Expenses: Not on file  Food Insecurity:   . Worried About Charity fundraiser in the Last Year: Not on file  . Ran Out of Food in the Last Year: Not on file  Transportation Needs:   . Lack of Transportation (Medical): Not on file  . Lack of Transportation (Non-Medical): Not on file  Physical Activity:   . Days of Exercise per Week: Not on file  . Minutes of Exercise per Session: Not on file  Stress:   . Feeling of Stress : Not on file  Social Connections:   . Frequency of Communication with Friends and Family: Not on file  . Frequency of Social Gatherings with Friends and Family: Not on file  . Attends Religious Services: Not on file  . Active Member of Clubs or Organizations: Not on file  . Attends Club or  Organization Meetings: Not on file  . Marital Status: Not on file  Intimate Partner Violence:   . Fear of Current or Ex-Partner: Not on file  . Emotionally Abused: Not on file  . Physically Abused: Not on file  . Sexually Abused: Not on file   Family History  Problem Relation Age of Onset  . Heart disease Father       VITAL SIGNS BP (!) 121/58   Pulse 77   Temp 97.6 F (36.4 C)   Resp 20   Ht 5\' 6"  (1.676 m)   Wt 266 lb (120.7 kg)   SpO2 97%   BMI 42.93 kg/m   Outpatient Encounter Medications as of 04/15/2020  Medication Sig  . acetaminophen (TYLENOL) 500 MG tablet Take 2 tablets (1,000 mg total) by mouth 3 (three) times daily.  Marland Kitchen allopurinol (ZYLOPRIM) 300 MG tablet Take 300 mg by mouth daily.  Marland Kitchen  amLODipine (NORVASC) 5 MG tablet Take 1 tablet (5 mg total) by mouth daily.  . Cholecalciferol (VITAMIN D) 50 MCG (2000 UT) CAPS Take 1 capsule (2,000 Units total) by mouth daily.  Marland Kitchen ELIQUIS 2.5 MG TABS tablet TAKE 1 TABLET(2.5 MG) BY MOUTH TWICE DAILY  . insulin aspart (NOVOLOG FLEXPEN) 100 UNIT/ML FlexPen Inject 5 Units into the skin in the morning, at noon, and at bedtime. Special instructions: Administer if blood sugar >150  . loperamide (IMODIUM) 2 MG capsule Take 2 mg by mouth daily as needed for diarrhea or loose stools.  . metolazone (ZAROXOLYN) 2.5 MG tablet Take 1 tablet (2.5 mg total) by mouth every Monday, Wednesday, and Friday.  . metoprolol succinate (TOPROL-XL) 50 MG 24 hr tablet Take 1 tablet (50 mg total) by mouth at bedtime.  . ondansetron (ZOFRAN) 4 MG tablet Take 1 tablet (4 mg total) by mouth every 6 (six) hours as needed for nausea.  . OXYGEN Inhale 2 L into the lungs continuous. Document O2 sat every shift  . potassium chloride (KLOR-CON) 10 MEQ tablet Take 2 tablets (20 mEq total) by mouth daily.  . rosuvastatin (CRESTOR) 20 MG tablet Take 1 tablet (20 mg total) by mouth daily.  . sodium bicarbonate 650 MG tablet Take 650 mg by mouth 3 (three) times daily.  Marland Kitchen torsemide (DEMADEX) 20 MG tablet Take 5 tablets (100 mg total) by mouth 2 (two) times daily.  Nelva Nay SOLOSTAR 300 UNIT/ML SOPN INJECT 60 UNITS INTO THE SKIN AT BEDTIME  . traMADol (ULTRAM) 50 MG tablet Take 1 tablet (50 mg total) by mouth every 6 (six) hours as needed for moderate pain.  Marland Kitchen UNABLE TO FIND Diet: NAS  . [DISCONTINUED] insulin aspart (NOVOLOG) 100 UNIT/ML injection Inject 0-10 Units into the skin 3 (three) times daily with meals. insulin aspart (novoLOG) injection 0-10 Units  0-10 Units Subcutaneous, 3 times daily with meals  CBG < 70: Implement Hypoglycemia Standing Orders and refer to Hypoglycemia Standing Orders sidebar report   CBG 70 - 120: 0 unit CBG 121 - 150: 0 unit  CBG 151 - 200: 1 unit  CBG  201 - 250: 2 units  CBG 251 - 300: 4 units  CBG 301 - 350: 6 units   CBG 351 - 400: 8 units  CBG > 400: 10 units   No facility-administered encounter medications on file as of 04/15/2020.     SIGNIFICANT DIAGNOSTIC EXAMS  TODAY  04-08-20: chest x-ray:  1. Small bilateral pleural effusions with bibasilar atelectasis. 2. Diffuse interstitial prominence likely reflects pulmonary edema.  04-09-20:  2-d echo:  Left ventricular ejection fraction, by estimation, is 60 to 65%. The  left ventricle has normal function. The left ventricle has no regional  wall motion abnormalities. There is mild left ventricular hypertrophy.  Left ventricular diastolic parameters  are indeterminate in the setting of atrial fibrillation.   TODAY  04-08-20: wbc 10.2; hgb 8.0; hct 28.1; mcv 94.9 plt 361; glucose 185; bun 60; creat 2.96; k+ 2.9; na++ 140; ca 8.6 liver normal albumin 2.0; hgb a1c 7.5 BNP 468.0 mag 1.6 04-10-20: glucose 209; bun 64; creat 2.88; k+ 3.3; na++ 136; ca 8.6 04-12-20: glucose 182; bun 74; creat 2.82; k+ 2.4; na++ 135; ca 8.9 mag 1.8 04-13-20: glucose 190; bun 78; creat 2.97; k+ 3.2; na++ 132; ca 8.8 04-14-20; wbc 11.1; hgb 7.2; hct 25.5; mcv 94.4 plt 347; glucose 201; bun 86; creat 3.10; k+ 3.8; na++ 133; ca 8.7 mag 1.9 04-15-20: glucose 203; bun 92; creat 3.39 k+ 3.6; na++ 130; ca 9.0    Review of Systems  Constitutional: Positive for weight loss. Negative for malaise/fatigue.  Respiratory: Positive for shortness of breath. Negative for cough.   Cardiovascular: Positive for leg swelling. Negative for chest pain and palpitations.  Gastrointestinal: Negative for abdominal pain, constipation and heartburn.  Musculoskeletal: Negative for back pain, joint pain and myalgias.  Skin: Negative.   Neurological: Positive for weakness. Negative for dizziness.  Psychiatric/Behavioral: The patient is not nervous/anxious.     Physical Exam Constitutional:      General: She is not in acute distress.     Appearance: She is well-developed. She is morbidly obese. She is not diaphoretic.  Neck:     Thyroid: No thyromegaly.  Cardiovascular:     Rate and Rhythm: Normal rate. Rhythm irregular.     Heart sounds: Murmur heard.      Comments: Pedal pulses faint  Pulmonary:     Effort: Pulmonary effort is normal. No respiratory distress.     Breath sounds: Normal breath sounds.     Comments: 02 Abdominal:     General: Bowel sounds are normal. There is no distension.     Palpations: Abdomen is soft.     Tenderness: There is no abdominal tenderness.  Musculoskeletal:     Cervical back: Neck supple.     Right lower leg: Edema present.     Left lower leg: Edema present.     Comments: Is able to move all extremities Bilateral lower extremity edema   Lymphadenopathy:     Cervical: No cervical adenopathy.  Skin:    General: Skin is warm and dry.     Comments: Bilateral lower extremities discolored/red   Neurological:     Mental Status: She is alert and oriented to person, place, and time.        ASSESSMENT/ PLAN:  TODAY  1. Acute on chronic diastolic heart failure: is stable EF 60-65% (04-09-20): will continue demadex 100 mg twice daily with k+ 20 meq daily metolazone 2.5 mg three times weekly  toprol xl 50 mg daily is on 02   2. Persistent atrial fibrillation: is stable will continue toprol xl 50 mg daily for rate control and eliquis 2.5 mg twice daily   3. Coronary artery disease involving native coronary artery of native heart with unstable angina pectoris: is stable will continue toprol xl 50 mg daily  4. Hypertension associated with stage 5 chronic kidney disease due to type 2 diabetes mellitus: is stable b/p 121/58: will continue norvasc 5 mg daily toprol xl  50 mg daily   5. Acute respiratory failure with hypoxia: is stable is on 02 will monitor   6. CKD stage 5 due to type 2 diabetes mellitus: is stable bun 92; creat 3.39 will monitor is on sodium bicarb 650 gm three times daily    7. Obesity class III BMI 40-49.9 (morbid obesity) without change: BMI 42.93  8. Chronic gout due to renal impairment of multiple sites without tophi: is stable will continue allopurinol 300 mg daily and tylenol 1 gm three times daily   9. Hyperlipidemia associated with type 2 diabetes mellitus: is stable will continue crestor 20 mg daily   10. Type 2 diabetes mellitus with stage 5 chronic kidney disease not on chronic dialysis with long term current use of insulin: is stable hgb a1c 7.5: will continue novolog 5 units with meals for cbg >150; toujeo 60 units nightly   11. Acquired lymphedema os lower extremity: is stable bilateral lower legs with less edema present; legs are discolored  12. Anemia due to stage 5 chronic kidney disease treated with darbepoetin: is without change: hgb 7.2 will continue aranesp every other week.      MD is aware of resident's narcotic use and is in agreement with current plan of care. We will attempt to wean resident as appropriate.  Ok Edwards NP [facility]  Contact [phone] Monday through Friday 8am- 5pm  After hours call [phone]

## 2020-04-15 NOTE — Telephone Encounter (Signed)
-----   Message from Howie Ill sent at 04/14/2020  3:21 PM EDT ----- Regarding: Pt needs BMET this Thursday Per Dr Harl Bowie Pt needs to have a BMET 9/28 21 she is being transferred to [facility] - can you put the order in?  Thank you   Romie Minus

## 2020-04-16 ENCOUNTER — Non-Acute Institutional Stay (SKILLED_NURSING_FACILITY): Payer: PPO | Admitting: Internal Medicine

## 2020-04-16 ENCOUNTER — Encounter: Payer: Self-pay | Admitting: Internal Medicine

## 2020-04-16 DIAGNOSIS — F32A Depression, unspecified: Secondary | ICD-10-CM | POA: Insufficient documentation

## 2020-04-16 DIAGNOSIS — Z794 Long term (current) use of insulin: Secondary | ICD-10-CM | POA: Diagnosis not present

## 2020-04-16 DIAGNOSIS — I5033 Acute on chronic diastolic (congestive) heart failure: Secondary | ICD-10-CM | POA: Diagnosis not present

## 2020-04-16 DIAGNOSIS — J9601 Acute respiratory failure with hypoxia: Secondary | ICD-10-CM | POA: Diagnosis not present

## 2020-04-16 DIAGNOSIS — E1122 Type 2 diabetes mellitus with diabetic chronic kidney disease: Secondary | ICD-10-CM | POA: Diagnosis not present

## 2020-04-16 DIAGNOSIS — I120 Hypertensive chronic kidney disease with stage 5 chronic kidney disease or end stage renal disease: Secondary | ICD-10-CM

## 2020-04-16 DIAGNOSIS — F322 Major depressive disorder, single episode, severe without psychotic features: Secondary | ICD-10-CM

## 2020-04-16 DIAGNOSIS — N185 Chronic kidney disease, stage 5: Secondary | ICD-10-CM

## 2020-04-16 NOTE — Assessment & Plan Note (Signed)
Continue nasal O2

## 2020-04-16 NOTE — Assessment & Plan Note (Signed)
Nutrition consult at SNF.

## 2020-04-16 NOTE — Assessment & Plan Note (Addendum)
BP controlled; trial off Amlodipine due to intractable edema.

## 2020-04-16 NOTE — Assessment & Plan Note (Addendum)
Patient to report 3 pound weight gain in 1 day or 5 pounds in 1 week.  Fluid restriction to 60 ounces a day;NAS restriction @ SNF.

## 2020-04-16 NOTE — Assessment & Plan Note (Signed)
Depression is present clinically to at least moderately severe degree even though she denies this.  Adjustments have been made in several medications; her clinical response will be monitored.  Starting an SSRI is problematic as she is on tramadol.  This would increase the potential risk for serotonin syndrome.

## 2020-04-16 NOTE — Assessment & Plan Note (Addendum)
As per medical literature SOC; sliding scale coverage discontinued at the SNF 5 units of NovoLog AC for glucoses over 150

## 2020-04-16 NOTE — Progress Notes (Signed)
NURSING HOME LOCATION:  [facility]  ROOM NUMBER:  135-P  CODE STATUS: DNR   PCP:  Rosalee Kaufman, PA-C   This is a comprehensive admission note to [facility] performed on this date less than 30 days from date of admission. Included are preadmission medical/surgical history; reconciled medication list; family history; social history and comprehensive review of systems.  Corrections and additions to the records were documented. Comprehensive physical exam was also performed. Additionally a clinical summary was entered for each active diagnosis pertinent to this admission in the Problem List to enhance continuity of care.  HPI: Patient was hospitalized 9/21-9/27/2021 admitted with worsening lower extremity edema over the prior 4 months with progressive and worsening dyspnea with 10 pound weight gain over the 7-10 days PTA.  DOE was moderate to severe to the point that talking or going to the bathroom were significant tasks.  Despite sleeping in recliner she noted orthopnea.  Cardiology had titrated her diuretic therapy without significant response PTA. Patient received 100 mg of IV furosemide and p.o. metolazone added 3 days a week.  40 mEq of potassium chloride and 2 g of magnesium sulfate were initiated for documented deficiencies. Clinically acute respiratory failure with hypoxia in the setting of acute on chronic diastolic heart failure was diagnosed.  EF 60-65%. During hospitalization hemoglobin drifted downward necessitating stopping the aspirin.  Eliquis was continued for the persistent A. Fib. She is diagnosed with CKD stage IV but this deteriorated to stage V in the context of the congestive heart failure.  Hemodialysis was declined by the patient. Aranesp 60 mcg every other Monday starting 10/4 was recommended for the anemia of CKD.  She was advised to avoid nonsteroidal agents. Palliative care consultation was completed; possible transition to Hospice was to be  considered. At discharge weight was 264.4 down from admission weight of 283 pounds. Daily weights were recommended with report to the PCP of greater than 3 pound weight gain in 1 day or more than 5 pounds in 1 week.  Fluid intake was to be limited to 60 ounces per day.  Sliding scale insulin was prescribed at discharge.  This was changed to [facility] maintenance NovoLog based on AC glucose. BMP and CBC were recommended every Monday, Wednesday, and Friday; however, her anemia is severe enough that she has had received 1 unit of packed cells since admission to the SNF.  Past medical and surgical history: Includes history of A. fib, CAD, diastolic congestive heart failure, CKD stage IV, gout, essential hypertension, obesity, dyslipidemia and insulin-dependent diabetes.  Morbid obesity was class III with a BMI of 45.16. Surgeries and procedures include coronary artery stenting and cholecystectomy.  Social history: Nondrinker; never smoked.  Family history: Noncontributory due to age.   Review of systems: She states that she feels "terrible" and describes "hurting all over".  Her biggest concern is she does not feel she is able to participate in PT/OT activities because "my legs are too heavy".   "My body is shaking;they say I am doing it ,but I am not."  She describes some anxiety because of her inability to participate optimally with PT/OT but denies any depression despite the clinical findings below.  She goes on to state that "I am just going to lie here and die". Staff reports that she is refusing to get out of bed.  Staff reports she needs maximum assist for any mobilization. She denies any chest pain at this time.  She states that she is sleeping poorly.  Initially she seemed to indicate she had a diagnosis of sleep apnea but subsequently denied this.  She states no one has told her that she snores or stops breathing at night. She states that she has not had a gout attack for several  years.  Constitutional: No fever Eyes: No redness, discharge, pain, vision change ENT/mouth: No nasal congestion, purulent discharge, earache, change in hearing, sore throat  Cardiovascular: No palpitations Respiratory: No cough, sputum production, hemoptysis Gastrointestinal: No heartburn, dysphagia, abdominal pain, nausea /vomiting, rectal bleeding, melena, change in bowels Genitourinary: No dysuria, hematuria, pyuria, incontinence, nocturia Dermatologic: No new rash, pruritus, change in appearance of skin Neurologic: No dizziness, headache, syncope, seizures Psychiatric: No  anorexia Endocrine: No change in hair/skin/nails, excessive thirst, excessive hunger, excessive urination  Hematologic/lymphatic: No  lymphadenopathy, abnormal bleeding Allergy/immunology: No itchy/watery eyes, significant sneezing, urticaria, angioedema  Physical exam:  Pertinent or positive findings: At the time of the exam she was still in bed at approximately noon.  There was minimal spontaneous activity.  Despite her description of "shaking" no tremor was noted.  She was tearful and kept her eyes closed for the most part during the exam.  Her vocal cadence exhibited a whining character.  She is missing mandibular teeth.  There is a soft grade 1 systolic murmur at the base.  Breath sounds are decreased.  Abdomen is massive.  Her legs are massive with pitting edema up to the knees.  Pedal pulses are not palpable.  Marked fusiform change of the knees are noted.  There was no significant movement of the lower extremities.  She has bruising over the forearms.  There are 2 sites oozing blood of the left forearm which are dressed.  Clubbing of the nailbeds is suggested.  She has a benign polypoid lesion above the right lateral lip.  General appearance:no acute distress, increased work of breathing is present.   Lymphatic: No lymphadenopathy about the head, neck, axilla. Eyes: No conjunctival inflammation or lid edema is  present. There is no scleral icterus. Ears:  External ear exam shows no significant lesions or deformities.   Nose:  External nasal examination shows no deformity or inflammation. Nasal mucosa are pink and moist without lesions, exudates Oral exam: There is no oropharyngeal erythema or exudate. Neck:  No thyromegaly, masses, tenderness noted.    Heart:  Normal rate and regular rhythm. S1 and S2 normal without gallop, click, rub.  Lungs: without wheezes, rhonchi, rales, rubs. Abdomen: Bowel sounds are normal.  Abdomen is soft and nontender with no organomegaly, hernias, masses. GU: Deferred  Extremities:  No cyanosis. Neurologic exam: Balance, Rhomberg, finger to nose testing could not be completed due to clinical state Skin: Warm & dry w/o tenting. No significant rash.  See clinical summary under each active problem in the Problem List with associated updated therapeutic plan

## 2020-04-16 NOTE — Patient Instructions (Signed)
See assessment and plan under each diagnosis in the problem list and acutely for this visit 

## 2020-04-16 NOTE — Assessment & Plan Note (Addendum)
Patient has declined hemodialysis despite worsening of renal function with the intractable congestive heart failure. Allopurinol will be reduced to 50 mg Monday and Thursday with recheck of uric acid at next blood draw.  If uric acid is less than 5; allopurinol could be discontinued.

## 2020-04-17 ENCOUNTER — Non-Acute Institutional Stay: Payer: PPO | Admitting: Nurse Practitioner

## 2020-04-17 DIAGNOSIS — E11621 Type 2 diabetes mellitus with foot ulcer: Secondary | ICD-10-CM | POA: Diagnosis not present

## 2020-04-17 DIAGNOSIS — I5033 Acute on chronic diastolic (congestive) heart failure: Secondary | ICD-10-CM | POA: Diagnosis not present

## 2020-04-17 DIAGNOSIS — I110 Hypertensive heart disease with heart failure: Secondary | ICD-10-CM | POA: Diagnosis not present

## 2020-04-17 DIAGNOSIS — Z515 Encounter for palliative care: Secondary | ICD-10-CM

## 2020-04-17 DIAGNOSIS — N185 Chronic kidney disease, stage 5: Secondary | ICD-10-CM

## 2020-04-17 DIAGNOSIS — L89321 Pressure ulcer of left buttock, stage 1: Secondary | ICD-10-CM | POA: Diagnosis not present

## 2020-04-17 NOTE — Progress Notes (Addendum)
[facility] Consult Note Telephone: [phone]  Fax: [phone]  PATIENT NAME: [place] [address] [phone] (home)  DOB: 24-Jan-1936 MRN: [id number]  PRIMARY CARE PROVIDER:    Rosine Door,  [address] [place] [address] [phone]  REFERRING PROVIDER:   Rosine Door [place],  [place] [address] [phone]  RESPONSIBLE PARTY:   Extended Emergency Contact Information Primary Emergency Contact: Lucio Edward Address: [address] [place] [address] Johnnette Litter of [place] Phone: [phone] Mobile Phone: [phone] Relation: Son Secondary Emergency Contact: Horton,Melanie  [place] Phone: [phone] Relation: Daughter  I met face to face with patient in facility.  ASSESSMENT AND RECOMMENDATIONS:   1. Advance Care Planning/Goals of Care: Visit at the request of Rosalee Kaufman, [place] for palliative care consult. Today's visit consist of building trust and discussions on Palliative care medicine as a specialized medical care for people living with serious illness, aimed at improving quality of life through symptoms management/relief, assisting with advance care plan and establishing goals of care. Patient expressed appreciation for the education saying she does not know about Palliative care, but have heard about Hospice care.  Goals of care: Patient's goal of care is comfort. Patient verbalized desire to be comfortable,saying " I don't want to prolong anything, I don't want to be in pain". Validation and emotional support provided. Patient expressed desire for Hospice care if she is not going to get better.  Directives: Patient verbalized desire to not be resuscitated in the event of cardiac or respiratory arrest. She reiterated desire for her code status to remain Do Not Resuscitate. Patient has a completed  MOST form on [facility] EMR, details include: DNR, Limited additional intervention (other instructions states: do not intubate, no dialysis), antibiotic if indicated, IV fluids for trial period, no feeding tube.  2. Symptom Management:  Patient s/p hospitalization from 04/08/20 to 04/14/2020, was hospitalized for HF exacerbation. She was diuresed, weight on hospital discharge 264.4lbs down from 283lbs on admission. Patient noted today with distended abdomen and swollen and firm bilateral lower legs up to her knees, morbidly obese BMI >31m/m2. Patient has a history of blood transfusions, currently on Arenesp injections related to anemia secondary to her chronic kidney disease, she has CKD V declined dialysis. Patient noted to be dyspneic with speech, oxygen saturation was 97%.  Patient report being weak and having no strength for any activity. Report legs to be very sensitive and painful.  Patient noted with wound on posterior side of right knee, site dry and scabbed. Two skin tears noted on left arm, several ecchymosis noted on bilateral arms (patient on Eliquis). Discussed and reviewed visit findings with patient's nurse and facility nurse practitioner. Staff report poor participation in therapy, patient unable to lift legs and complains of pain with slight activity.Staff report patient not participating in physical therapy sessions, refusing to get out of bed, refusing wound care, reporting pain issues. Patient's current pain medication regimen is Tylenol 10059mTID and Tramadol 5022mvery 6 hrs as needed.  Patient denied pain during visit today, stating she only has pain with activity. Recommended patient ask for pain medication before therapy sessions to control discomfort and allow increase participation. Discussed potential Hospice care services eligibility, facility provider report plan is to keep encouraging patient and monitoring her progress, if patient  not progressing would consider referral for  Hospice care. Palliative care will continue to provide support to patient, family and the medical team.    3. Follow up Palliative Care Visit: Palliative care will continue to follow for goals of care clarification and symptom management. Return in about 2 weeks or prn to evaluate progress with therapy and Rehab stay.  4. Family /Caregiver/Community Supports: Patient is a widow, report husband died in 2020/08/02. Lived in [facility] facility since June 2021 untill time of hospital admission. Patient children are very involved in her care. Reviewed visit findings with her daughter Threasa Beards, she expressed appreciation for the visit and update, made aware of next visit date.  5. Cognitive / Functional decline: Patient appeared drowsy, has eyes closed during visit conversation. She is however oriented x 4 and a good historian. Patient is bed bound, currently dependent on staff for all of her ADLs but able to feed self and use a telephone independently. Patient report function worsened in the last year due to extreme edema and heaviness to her lower legs, not able to move legs.She report she could walk when she was in the assisted living and gets around on a wheel chair.   I spent 60 minutes providing this consultation, time includes time spent with patient and on phone with daughter Threasa Beards, chart review, provider coordination, and documentation. More than 50% of the time in this consultation was spent coordinating communication.   HISTORY OF PRESENT ILLNESS:  DELORIS MOGER is a 84 y.o. year old female with multiple medical problems including CKD V, A-fib, CAD, diastolic heart failure (EF 60-65%), gout, HTN, Insulin dependent diabetes, morbid obesity. Patient was a former resident at an [facility] facility. Palliative Care was asked to help address advance care planning and goals of care. This is an initial visit.  CODE STATUS: DNR  PPS: 30%  HOSPICE ELIGIBILITY/DIAGNOSIS:  TBD  PAST MEDICAL HISTORY:  Past Medical History:  Diagnosis Date   Atrial fibrillation (HCC)    CAD (coronary artery disease)    Multivessel, DES to LAD and RCA 08/2016   Cardiomyopathy (HCC)    LVEF 45%   CKD (chronic kidney disease) stage 5, GFR less than 15 ml/min (HCC)    Essential hypertension    Gout    Pancreatitis, recurrent    Type 2 diabetes mellitus ([facility])     SOCIAL HX:  Social History   Tobacco Use   Smoking status: Never Smoker   Smokeless tobacco: Never Used  Substance Use Topics   Alcohol use: No   FAMILY HX:  Family History  Problem Relation Age of Onset   Heart disease Father     ALLERGIES: No Known Allergies   PERTINENT MEDICATIONS:  Outpatient Encounter Medications as of 04/17/2020  Medication Sig   acetaminophen (TYLENOL) 500 MG tablet Take 2 tablets (1,000 mg total) by mouth 3 (three) times daily.   allopurinol (ZYLOPRIM) 300 MG tablet Take 300 mg by mouth daily.   amLODipine (NORVASC) 5 MG tablet Take 1 tablet (5 mg total) by mouth daily.   Cholecalciferol (VITAMIN D) 50 MCG (2000 UT) CAPS Take 1 capsule (2,000 Units total) by mouth daily.   ELIQUIS 2.5 MG TABS tablet TAKE 1 TABLET(2.5 MG) BY MOUTH TWICE DAILY   insulin aspart (NOVOLOG FLEXPEN) 100 UNIT/ML FlexPen Inject 5 Units into the skin in the morning, at noon, and at bedtime. Special instructions: Administer if blood sugar >150   loperamide (IMODIUM) 2 MG capsule Take 2  mg by mouth daily as needed for diarrhea or loose stools.   metolazone (ZAROXOLYN) 2.5 MG tablet Take 1 tablet (2.5 mg total) by mouth every Monday, Wednesday, and Friday.   metoprolol succinate (TOPROL-XL) 50 MG 24 hr tablet Take 1 tablet (50 mg total) by mouth at bedtime.   ondansetron (ZOFRAN) 4 MG tablet Take 1 tablet (4 mg total) by mouth every 6 (six) hours as needed for nausea.   OXYGEN Inhale 2 L into the lungs continuous. Document O2 sat every shift   potassium chloride (KLOR-CON) 10 MEQ tablet  Take 2 tablets (20 mEq total) by mouth daily.   rosuvastatin (CRESTOR) 20 MG tablet Take 1 tablet (20 mg total) by mouth daily.   sodium bicarbonate 650 MG tablet Take 650 mg by mouth 3 (three) times daily.   torsemide (DEMADEX) 20 MG tablet Take 5 tablets (100 mg total) by mouth 2 (two) times daily.   TOUJEO SOLOSTAR 300 UNIT/ML SOPN INJECT 60 UNITS INTO THE SKIN AT BEDTIME   traMADol (ULTRAM) 50 MG tablet Take 1 tablet (50 mg total) by mouth every 6 (six) hours as needed for moderate pain.   UNABLE TO FIND Diet: NAS   No facility-administered encounter medications on file as of 04/17/2020.    PHYSICAL EXAM / ROS:   Current and past weights: 266lbs, Ht 29f6", BMI 42.93kg/m2 General:  Morbidly obese, frail appearing, lying in bed with eyes closed, opens eyes intermittently  Cardiovascular:denied chest pain reported, anasarca Pulmonary: no cough, dyspniec Abdomen: appetite fair, denied constipation, incontinent of bowel GU: denies dysuria, incontinent of urine MSK:  Non-ambulatory Skin: thin and fragile  Neurological: weakness, but otherwise non-focal   QJari Favre DNP, AGPCNP-BC

## 2020-04-18 ENCOUNTER — Other Ambulatory Visit: Payer: Self-pay | Admitting: Adult Health

## 2020-04-18 ENCOUNTER — Telehealth: Payer: Self-pay

## 2020-04-18 ENCOUNTER — Encounter: Payer: Self-pay | Admitting: Adult Health

## 2020-04-18 ENCOUNTER — Non-Acute Institutional Stay (SKILLED_NURSING_FACILITY): Payer: PPO | Admitting: Adult Health

## 2020-04-18 DIAGNOSIS — R52 Pain, unspecified: Secondary | ICD-10-CM | POA: Diagnosis not present

## 2020-04-18 DIAGNOSIS — M1A39X0 Chronic gout due to renal impairment, multiple sites, without tophus (tophi): Secondary | ICD-10-CM | POA: Diagnosis not present

## 2020-04-18 DIAGNOSIS — G8929 Other chronic pain: Secondary | ICD-10-CM | POA: Diagnosis not present

## 2020-04-18 MED ORDER — TRAMADOL HCL 50 MG PO TABS
25.0000 mg | ORAL_TABLET | Freq: Three times a day (TID) | ORAL | 0 refills | Status: DC
Start: 2020-04-18 — End: 2020-04-30

## 2020-04-18 MED ORDER — METOLAZONE 2.5 MG PO TABS: ORAL_TABLET | ORAL | 3 refills | Status: DC

## 2020-04-18 NOTE — Telephone Encounter (Signed)
-----   Message from Arnoldo Lenis, MD sent at 04/17/2020  8:35 PM EDT ----- Worsening renal function, can we change her metolazone to just 2.5mg  on Wednesdays. REpeat a BMET/Mg Fri Oct 8th  Zandra Abts MD

## 2020-04-18 NOTE — Progress Notes (Signed)
Location:    [facility] Room Number: 135/P Place of Service:  SNF (31)   CODE STATUS: DNR  No Known Allergies  Chief Complaint  Patient presents with  . Acute Visit    Pain Management    HPI:  She has chronic pain to bilateral lower extremities. The pain is chronic in nature has had for the past 4-5 months. She is currently on routine tylenol 1 gm three times daily. She has prn ultram but has not utilized this medication. She is unable to tolerate getting out of bed due her pain. She would benefit from having a scheduled pain medication.   Past Medical History:  Diagnosis Date  . Atrial fibrillation ([facility])   . CAD (coronary artery disease)    Multivessel, DES to LAD and RCA 08/2016  . Cardiomyopathy (HCC)    LVEF 45%  . CKD (chronic kidney disease) stage 5, GFR less than 15 ml/min (HCC)   . Essential hypertension   . Gout   . Pancreatitis, recurrent   . Type 2 diabetes mellitus ([facility])     Past Surgical History:  Procedure Laterality Date  . CHOLECYSTECTOMY    . CORONARY STENT INTERVENTION N/A 09/08/2016   Procedure: Coronary Stent Intervention;  Surgeon: Belva Crome, MD;  Location: [facility];  Service: Cardiovascular;  Laterality: N/A;  . LEFT HEART CATH AND CORONARY ANGIOGRAPHY N/A 09/03/2016   Procedure: Left Heart Cath and Coronary Angiography;  Surgeon: Nelva Bush, MD;  Location: [facility];  Service: Cardiovascular;  Laterality: N/A;  . TONSILLECTOMY      Social History   Socioeconomic History  . Marital status: Widowed    Spouse name: Not on file  . Number of children: Not on file  . Years of education: Not on file  . Highest education level: Not on file  Occupational History  . Not on file  Tobacco Use  . Smoking status: Never Smoker  . Smokeless tobacco: Never Used  Vaping Use  . Vaping Use: Never used  Substance and Sexual Activity  . Alcohol use: No  . Drug use: No  . Sexual activity: Not on file  Other  Topics Concern  . Not on file  Social History Narrative  . Not on file   Social Determinants of Health   Financial Resource Strain:   . Difficulty of Paying Living Expenses: Not on file  Food Insecurity:   . Worried About Charity fundraiser in the Last Year: Not on file  . Ran Out of Food in the Last Year: Not on file  Transportation Needs:   . Lack of Transportation (Medical): Not on file  . Lack of Transportation (Non-Medical): Not on file  Physical Activity:   . Days of Exercise per Week: Not on file  . Minutes of Exercise per Session: Not on file  Stress:   . Feeling of Stress : Not on file  Social Connections:   . Frequency of Communication with Friends and Family: Not on file  . Frequency of Social Gatherings with Friends and Family: Not on file  . Attends Religious Services: Not on file  . Active Member of Clubs or Organizations: Not on file  . Attends Archivist Meetings: Not on file  . Marital Status: Not on file  Intimate Partner Violence:   . Fear of Current or Ex-Partner: Not on file  . Emotionally Abused: Not on file  . Physically Abused: Not on file  .  Sexually Abused: Not on file   Family History  Problem Relation Age of Onset  . Heart disease Father       VITAL SIGNS BP (!) 149/61   Pulse 73   Temp (!) 96.3 F (35.7 C)   Resp 16   Ht 5\' 6"  (1.676 m)   Wt 266 lb (120.7 kg)   SpO2 92%   BMI 42.93 kg/m   Outpatient Encounter Medications as of 04/18/2020  Medication Sig  . acetaminophen (TYLENOL) 500 MG tablet Take 2 tablets (1,000 mg total) by mouth 3 (three) times daily.  Marland Kitchen allopurinol (ZYLOPRIM) 100 MG tablet Take 50 mg by mouth daily. On Monday and Tuesday  . Balsam [place]-Castor Oil (VENELEX) OINT Apply topically in the morning, at noon, and at bedtime. Special Instructions: Apply to sacrum and bilateral buttocks qshift for Moisture Associated damage.  . Cholecalciferol (VITAMIN D) 50 MCG (2000 UT) CAPS Take 1 capsule (2,000 Units  total) by mouth daily.  Marland Kitchen ELIQUIS 2.5 MG TABS tablet TAKE 1 TABLET(2.5 MG) BY MOUTH TWICE DAILY  . insulin aspart (NOVOLOG FLEXPEN) 100 UNIT/ML FlexPen Inject 5 Units into the skin in the morning, at noon, and at bedtime. Special instructions: Administer if blood sugar >150  . loperamide (IMODIUM) 2 MG capsule Take 2 mg by mouth daily as needed for diarrhea or loose stools.  . metolazone (ZAROXOLYN) 2.5 MG tablet Take 2.5 mg every Wednesday  . metoprolol succinate (TOPROL-XL) 50 MG 24 hr tablet Take 1 tablet (50 mg total) by mouth at bedtime.  . NON FORMULARY 1700cc fluid restriction: dietary to give 840cc nursing to give 7-3= 360cc 3-11= 360cc 11-7= 140cc Special Instructions: Document fluid given each shift Every Shift Day, Evening, Night  . ondansetron (ZOFRAN) 4 MG tablet Take 1 tablet (4 mg total) by mouth every 6 (six) hours as needed for nausea.  . OXYGEN Inhale 2 L into the lungs continuous. Document O2 sat every shift  . potassium chloride (KLOR-CON) 10 MEQ tablet Take 2 tablets (20 mEq total) by mouth daily.  . rosuvastatin (CRESTOR) 20 MG tablet Take 1 tablet (20 mg total) by mouth daily.  . sodium bicarbonate 650 MG tablet Take 650 mg by mouth 3 (three) times daily.  Marland Kitchen torsemide (DEMADEX) 20 MG tablet Take 5 tablets (100 mg total) by mouth 2 (two) times daily.  Nelva Nay SOLOSTAR 300 UNIT/ML SOPN INJECT 60 UNITS INTO THE SKIN AT BEDTIME  . traMADol (ULTRAM) 50 MG tablet Take 0.5 tablets (25 mg total) by mouth 3 (three) times daily.  Marland Kitchen UNABLE TO FIND Diet: NAS  . [DISCONTINUED] allopurinol (ZYLOPRIM) 300 MG tablet Take 300 mg by mouth daily.  . [DISCONTINUED] amLODipine (NORVASC) 5 MG tablet Take 1 tablet (5 mg total) by mouth daily.   No facility-administered encounter medications on file as of 04/18/2020.     SIGNIFICANT DIAGNOSTIC EXAMS  PREVIOUS   04-08-20: chest x-ray:  1. Small bilateral pleural effusions with bibasilar atelectasis. 2. Diffuse interstitial prominence  likely reflects pulmonary edema.  04-09-20: 2-d echo:  Left ventricular ejection fraction, by estimation, is 60 to 65%. The  left ventricle has normal function. The left ventricle has no regional  wall motion abnormalities. There is mild left ventricular hypertrophy.  Left ventricular diastolic parameters  are indeterminate in the setting of atrial fibrillation.   NO NEW EXAMS.   PREVIOUS   04-08-20: wbc 10.2; hgb 8.0; hct 28.1; mcv 94.9 plt 361; glucose 185; bun 60; creat 2.96; k+ 2.9; na++ 140;  ca 8.6 liver normal albumin 2.0; hgb a1c 7.5 BNP 468.0 mag 1.6 04-10-20: glucose 209; bun 64; creat 2.88; k+ 3.3; na++ 136; ca 8.6 04-12-20: glucose 182; bun 74; creat 2.82; k+ 2.4; na++ 135; ca 8.9 mag 1.8 04-13-20: glucose 190; bun 78; creat 2.97; k+ 3.2; na++ 132; ca 8.8 04-14-20; wbc 11.1; hgb 7.2; hct 25.5; mcv 94.4 plt 347; glucose 201; bun 86; creat 3.10; k+ 3.8; na++ 133; ca 8.7 mag 1.9 04-15-20: glucose 203; bun 92; creat 3.39 k+ 3.6; na++ 130; ca 9.0   NO NEW LABS   Review of Systems  Constitutional: Positive for malaise/fatigue.  Respiratory: Positive for shortness of breath. Negative for cough.   Cardiovascular: Positive for leg swelling. Negative for chest pain and palpitations.  Gastrointestinal: Negative for abdominal pain, constipation and heartburn.  Musculoskeletal: Positive for joint pain. Negative for back pain and myalgias.       Bilateral leg pain   Skin: Negative.   Neurological: Positive for weakness. Negative for dizziness.  Psychiatric/Behavioral: The patient is not nervous/anxious.     Physical Exam Constitutional:      General: She is not in acute distress.    Appearance: She is morbidly obese. She is not diaphoretic.  Neck:     Thyroid: No thyromegaly.  Cardiovascular:     Rate and Rhythm: Normal rate. Rhythm irregular.     Pulses: Normal pulses.     Heart sounds: Murmur heard.   Pulmonary:     Effort: Pulmonary effort is normal. No respiratory distress.      Breath sounds: Normal breath sounds.     Comments: 02 Abdominal:     General: Bowel sounds are normal. There is no distension.     Palpations: Abdomen is soft.     Tenderness: There is no abdominal tenderness.  Musculoskeletal:     Cervical back: Neck supple.     Right lower leg: Edema present.     Left lower leg: Edema present.     Comments: Bilateral lower extremity lymphedema Is able to move all extremities    Lymphadenopathy:     Cervical: No cervical adenopathy.  Skin:    General: Skin is warm and dry.     Comments: Bilateral lower extremities discolored; red without warmth present   Neurological:     Mental Status: She is alert. Mental status is at baseline.  Psychiatric:        Mood and Affect: Mood normal.        ASSESSMENT/ PLAN:  TODAY  1. Chronic generalized pain 2. 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    MD is aware of resident's narcotic use and is in agreement with current plan of care. We will attempt to wean resident as appropriate.  Ok Edwards NP [facility]  Contact [phone] Monday through Friday 8am- 5pm  After hours call [phone]

## 2020-04-18 NOTE — Telephone Encounter (Signed)
I spoke with Kristina Howell at the [facility] center.Patients torsemide 2.5 mg to be taken only once a week now on Wednesdays with repeat BMET/Mg on 04/25/20

## 2020-04-21 ENCOUNTER — Non-Acute Institutional Stay (SKILLED_NURSING_FACILITY): Payer: PPO | Admitting: Adult Health

## 2020-04-21 ENCOUNTER — Other Ambulatory Visit (HOSPITAL_COMMUNITY)
Admission: RE | Admit: 2020-04-21 | Discharge: 2020-04-21 | Disposition: A | Discharge status: Home / Self Care | Payer: PPO | Source: Skilled Nursing Facility | Attending: Adult Health | Admitting: Adult Health

## 2020-04-21 ENCOUNTER — Encounter: Payer: Self-pay | Admitting: Adult Health

## 2020-04-21 DIAGNOSIS — N185 Chronic kidney disease, stage 5: Secondary | ICD-10-CM | POA: Diagnosis present

## 2020-04-21 DIAGNOSIS — L03116 Cellulitis of left lower limb: Secondary | ICD-10-CM

## 2020-04-21 DIAGNOSIS — L03115 Cellulitis of right lower limb: Secondary | ICD-10-CM | POA: Diagnosis not present

## 2020-04-21 DIAGNOSIS — E876 Hypokalemia: Secondary | ICD-10-CM

## 2020-04-21 DIAGNOSIS — I5033 Acute on chronic diastolic (congestive) heart failure: Secondary | ICD-10-CM

## 2020-04-21 DIAGNOSIS — R52 Pain, unspecified: Secondary | ICD-10-CM | POA: Insufficient documentation

## 2020-04-21 LAB — BASIC METABOLIC PANEL
Anion gap: 18 — ABNORMAL HIGH (ref 5–15)
BUN: 106 mg/dL — ABNORMAL HIGH (ref 8–23)
CO2: 34 mmol/L — ABNORMAL HIGH (ref 22–32)
Calcium: 8.9 mg/dL (ref 8.9–10.3)
Chloride: 82 mmol/L — ABNORMAL LOW (ref 98–111)
Creatinine, Ser: 3.73 mg/dL — ABNORMAL HIGH (ref 0.44–1.00)
GFR calc Af Amer: 12 mL/min — ABNORMAL LOW (ref >60)
GFR calc non Af Amer: 11 mL/min — ABNORMAL LOW (ref >60)
Glucose, Bld: 103 mg/dL — ABNORMAL HIGH (ref 70–99)
Potassium: 2.5 mmol/L — CL (ref 3.5–5.1)
Sodium: 134 mmol/L — ABNORMAL LOW (ref 135–145)

## 2020-04-21 LAB — URIC ACID: Uric Acid, Serum: 5.7 mg/dL (ref 2.5–7.1)

## 2020-04-21 NOTE — Progress Notes (Signed)
Location:    [place] Room Number: 135/P Place of Service:  SNF (31)   CODE STATUS: DNR  No Known Allergies  Chief Complaint  Patient presents with  . Follow-up    Lab Follow Up    HPI:  Her k+ level is 2.5. her bilateral lower extremities and upper lower extremities are red with heat present and are hard to palpation. There are no reports of fevers. She denies any worsening pain. She is not getting out of bed at this time. She remains total care with her adls. There are no reports of cough or shortness of breath.   Past Medical History:  Diagnosis Date  . Atrial fibrillation ([place])   . CAD (coronary artery disease)    Multivessel, DES to LAD and RCA 08/2016  . Cardiomyopathy (H[facility])    LVEF 45%  . CKD (chronic kidney disease) stage 5, GFR less than 15 ml/min (HCC)   . Essential hypertension   . Gout   . Pancreatitis, recurrent   . Type 2 diabetes mellitus ([place])     Past Surgical History:  Procedure Laterality Date  . CHOLECYSTECTOMY    . CORONARY STENT INTERVENTION N/A 09/08/2016   Procedure: Coronary Stent Intervention;  Surgeon: Belva Crome, MD;  Location: [place] [facility];  Service: Cardiovascular;  Laterality: N/A;  . LEFT HEART CATH AND CORONARY ANGIOGRAPHY N/A 09/03/2016   Procedure: Left Heart Cath and Coronary Angiography;  Surgeon: Nelva Bush, MD;  Location: [place] [facility];  Service: Cardiovascular;  Laterality: N/A;  . TONSILLECTOMY      Social History   Socioeconomic History  . Marital status: Widowed    Spouse name: Not on file  . Number of children: Not on file  . Years of education: Not on file  . Highest education level: Not on file  Occupational History  . Not on file  Tobacco Use  . Smoking status: Never Smoker  . Smokeless tobacco: Never Used  Vaping Use  . Vaping Use: Never used  Substance and Sexual Activity  . Alcohol use: No  . Drug use: No  . Sexual activity: Not on file  Other Topics Concern  .  Not on file  Social History Narrative  . Not on file   Social Determinants of Health   Financial Resource Strain:   . Difficulty of Paying Living Expenses: Not on file  Food Insecurity:   . Worried About Charity fundraiser in the Last Year: Not on file  . Ran Out of Food in the Last Year: Not on file  Transportation Needs:   . Lack of Transportation (Medical): Not on file  . Lack of Transportation (Non-Medical): Not on file  Physical Activity:   . Days of Exercise per Week: Not on file  . Minutes of Exercise per Session: Not on file  Stress:   . Feeling of Stress : Not on file  Social Connections:   . Frequency of Communication with Friends and Family: Not on file  . Frequency of Social Gatherings with Friends and Family: Not on file  . Attends Religious Services: Not on file  . Active Member of Clubs or Organizations: Not on file  . Attends Archivist Meetings: Not on file  . Marital Status: Not on file  Intimate Partner Violence:   . Fear of Current or Ex-Partner: Not on file  . Emotionally Abused: Not on file  . Physically Abused: Not on file  . Sexually  Abused: Not on file   Family History  Problem Relation Age of Onset  . Heart disease Father       VITAL SIGNS BP 125/77   Pulse 78   Temp (!) 97.3 F (36.3 C)   Resp 18   Ht 5\' 6"  (1.676 m)   Wt 266 lb (120.7 kg)   SpO2 95%   BMI 42.93 kg/m   Outpatient Encounter Medications as of 04/21/2020  Medication Sig  . acetaminophen (TYLENOL) 500 MG tablet Take 2 tablets (1,000 mg total) by mouth 3 (three) times daily.  Marland Kitchen allopurinol (ZYLOPRIM) 100 MG tablet Take 50 mg by mouth daily. On Monday and Tuesday  . Balsam [place]-Castor Oil (VENELEX) OINT Apply topically in the morning, at noon, and at bedtime. Special Instructions: Apply to sacrum and bilateral buttocks qshift for Moisture Associated damage.  . Cholecalciferol (VITAMIN D) 50 MCG (2000 UT) CAPS Take 1 capsule (2,000 Units total) by mouth daily.  Marland Kitchen  ELIQUIS 2.5 MG TABS tablet TAKE 1 TABLET(2.5 MG) BY MOUTH TWICE DAILY  . feeding supplement, ENSURE ENLIVE, (ENSURE ENLIVE) LIQD Take 237 mLs by mouth 2 (two) times daily between meals.  . insulin aspart (NOVOLOG FLEXPEN) 100 UNIT/ML FlexPen Inject 5 Units into the skin in the morning, at noon, and at bedtime. Special instructions: Administer if blood sugar >150  . loperamide (IMODIUM) 2 MG capsule Take 2 mg by mouth daily as needed for diarrhea or loose stools.  . metolazone (ZAROXOLYN) 2.5 MG tablet Take 2.5 mg every Wednesday  . metoprolol succinate (TOPROL-XL) 50 MG 24 hr tablet Take 1 tablet (50 mg total) by mouth at bedtime.  . NON FORMULARY 1700cc fluid restriction: dietary to give 840cc nursing to give 7-3= 360cc 3-11= 360cc 11-7= 140cc Special Instructions: Document fluid given each shift Every Shift Day, Evening, Night  . ondansetron (ZOFRAN) 4 MG tablet Take 1 tablet (4 mg total) by mouth every 6 (six) hours as needed for nausea.  . OXYGEN Inhale 2 L into the lungs continuous. Document O2 sat every shift  . [START ON 04/22/2020] potassium chloride SA (KLOR-CON) 20 MEQ tablet Take 40 mEq by mouth daily. For Hypokalemia  . rosuvastatin (CRESTOR) 20 MG tablet Take 1 tablet (20 mg total) by mouth daily.  . sodium bicarbonate 650 MG tablet Take 650 mg by mouth 3 (three) times daily.  Marland Kitchen torsemide (DEMADEX) 20 MG tablet Take 5 tablets (100 mg total) by mouth 2 (two) times daily.  Nelva Nay SOLOSTAR 300 UNIT/ML SOPN INJECT 60 UNITS INTO THE SKIN AT BEDTIME  . traMADol (ULTRAM) 50 MG tablet Take 0.5 tablets (25 mg total) by mouth 3 (three) times daily.  Marland Kitchen UNABLE TO FIND Diet: NAS  . [DISCONTINUED] potassium chloride (KLOR-CON) 10 MEQ tablet Take 2 tablets (20 mEq total) by mouth daily.   No facility-administered encounter medications on file as of 04/21/2020.     SIGNIFICANT DIAGNOSTIC EXAMS  PREVIOUS   04-08-20: chest x-ray:  1. Small bilateral pleural effusions with bibasilar  atelectasis. 2. Diffuse interstitial prominence likely reflects pulmonary edema.  04-09-20: 2-d echo:  Left ventricular ejection fraction, by estimation, is 60 to 65%. The  left ventricle has normal function. The left ventricle has no regional  wall motion abnormalities. There is mild left ventricular hypertrophy.  Left ventricular diastolic parameters  are indeterminate in the setting of atrial fibrillation.   NO NEW EXAMS.   PREVIOUS   04-08-20: wbc 10.2; hgb 8.0; hct 28.1; mcv 94.9 plt 361; glucose 185;  bun 60; creat 2.96; k+ 2.9; na++ 140; ca 8.6 liver normal albumin 2.0; hgb a1c 7.5 BNP 468.0 mag 1.6 04-10-20: glucose 209; bun 64; creat 2.88; k+ 3.3; na++ 136; ca 8.6 04-12-20: glucose 182; bun 74; creat 2.82; k+ 2.4; na++ 135; ca 8.9 mag 1.8 04-13-20: glucose 190; bun 78; creat 2.97; k+ 3.2; na++ 132; ca 8.8 04-14-20; wbc 11.1; hgb 7.2; hct 25.5; mcv 94.4 plt 347; glucose 201; bun 86; creat 3.10; k+ 3.8; na++ 133; ca 8.7 mag 1.9 04-15-20: glucose 203; bun 92; creat 3.39 k+ 3.6; na++ 130; ca 9.0   TODAY  04-21-20: glucose 103; bun 106; creat 3.73; k+ 2.5; na++ 134; ca 8.9    Review of Systems  Constitutional: Positive for malaise/fatigue.  Respiratory: Negative for cough and shortness of breath.   Cardiovascular: Negative for chest pain, palpitations and leg swelling.  Gastrointestinal: Negative for abdominal pain, constipation and heartburn.  Musculoskeletal: Negative for back pain, joint pain and myalgias.  Skin: Negative.   Neurological: Positive for weakness. Negative for dizziness.  Psychiatric/Behavioral: The patient is not nervous/anxious.      Physical Exam Constitutional:      General: She is not in acute distress.    Appearance: She is well-developed. She is morbidly obese. She is not diaphoretic.  Neck:     Thyroid: No thyromegaly.  Cardiovascular:     Rate and Rhythm: Normal rate. Rhythm irregular.     Pulses: Normal pulses.     Heart sounds: Murmur heard.    Pulmonary:     Effort: Pulmonary effort is normal. No respiratory distress.     Breath sounds: Normal breath sounds.     Comments: 02 Abdominal:     General: Bowel sounds are normal. There is no distension.     Palpations: Abdomen is soft.     Tenderness: There is no abdominal tenderness.  Musculoskeletal:     Right lower leg: Edema present.     Left lower leg: Edema present.     Comments: Bilateral lower extremity lymphedema Is able to move all extremities     Lymphadenopathy:     Cervical: No cervical adenopathy.  Skin:    General: Skin is warm and dry.     Comments: Bilateral lower extremities: upper and lower sections with areas that are red hot and firm to to palpation   Neurological:     Mental Status: She is alert and oriented to person, place, and time.  Psychiatric:        Mood and Affect: Mood normal.       ASSESSMENT/ PLAN:  TODAY  1. Acute on chronic diastolic heart failure 2. Hypokalemia 3. Bilateral lower extremity cellulitis  Is worse Will give k+ 40 meq four times today Then k+ 40 meq daily Will begin doxycyline 100 mg twice daily through 05-12-20 Will check BMP in the AM   MD is aware of resident's narcotic use and is in agreement with current plan of care. We will attempt to wean resident as appropriate.  Ok Edwards NP [facility]  Contact [phone] Monday through Friday 8am- 5pm  After hours call [phone]

## 2020-04-22 ENCOUNTER — Other Ambulatory Visit (HOSPITAL_COMMUNITY)
Admission: RE | Admit: 2020-04-22 | Discharge: 2020-04-22 | Disposition: A | Discharge status: Home / Self Care | Payer: PPO | Source: Skilled Nursing Facility | Attending: Adult Health | Admitting: Adult Health

## 2020-04-22 ENCOUNTER — Non-Acute Institutional Stay (SKILLED_NURSING_FACILITY): Payer: PPO | Admitting: Adult Health

## 2020-04-22 ENCOUNTER — Encounter: Payer: Self-pay | Admitting: Adult Health

## 2020-04-22 DIAGNOSIS — I4819 Other persistent atrial fibrillation: Secondary | ICD-10-CM | POA: Diagnosis not present

## 2020-04-22 DIAGNOSIS — L03116 Cellulitis of left lower limb: Secondary | ICD-10-CM | POA: Insufficient documentation

## 2020-04-22 DIAGNOSIS — E876 Hypokalemia: Secondary | ICD-10-CM

## 2020-04-22 DIAGNOSIS — I5033 Acute on chronic diastolic (congestive) heart failure: Secondary | ICD-10-CM | POA: Diagnosis not present

## 2020-04-22 LAB — BASIC METABOLIC PANEL
Anion gap: 16 — ABNORMAL HIGH (ref 5–15)
BUN: 107 mg/dL — ABNORMAL HIGH (ref 8–23)
CO2: 32 mmol/L (ref 22–32)
Calcium: 8.9 mg/dL (ref 8.9–10.3)
Chloride: 86 mmol/L — ABNORMAL LOW (ref 98–111)
Creatinine, Ser: 3.83 mg/dL — ABNORMAL HIGH (ref 0.44–1.00)
GFR calc non Af Amer: 10 mL/min — ABNORMAL LOW (ref >60)
Glucose, Bld: 119 mg/dL — ABNORMAL HIGH (ref 70–99)
Potassium: 3.3 mmol/L — ABNORMAL LOW (ref 3.5–5.1)
Sodium: 134 mmol/L — ABNORMAL LOW (ref 135–145)

## 2020-04-22 NOTE — Progress Notes (Signed)
Location:    [place] Room Number: 135/P Place of Service:  SNF (31)   CODE STATUS: DNR  No Known Allergies  Chief Complaint  Patient presents with  . Short Term Rehab (STR)         Hypokalemia:      Acute on chronic diastolic heart failure Persistent atrial fibrillation    Weekly follow up for the first 30 days post hospitalization.     HPI:  She is a 84 year old short term patient being seen for the management of her chronic illnesses:Hypokalemia:      Acute on chronic diastolic heart failure Persistent atrial fibrillation. There are no reports of uncontrolled pain. She continues on abt for her cellulitis. She is not getting out of bed. There are no reports of insomnia present.    Past Medical History:  Diagnosis Date  . Atrial fibrillation ([place])   . CAD (coronary artery disease)    Multivessel, DES to LAD and RCA 08/2016  . Cardiomyopathy (HCC)    LVEF 45%  . CKD (chronic kidney disease) stage 5, GFR less than 15 ml/min (HCC)   . Essential hypertension   . Gout   . Pancreatitis, recurrent   . Type 2 diabetes mellitus ([place])     Past Surgical History:  Procedure Laterality Date  . CHOLECYSTECTOMY    . CORONARY STENT INTERVENTION N/A 09/08/2016   Procedure: Coronary Stent Intervention;  Surgeon: Belva Crome, MD;  Location: [place] [facility];  Service: Cardiovascular;  Laterality: N/A;  . LEFT HEART CATH AND CORONARY ANGIOGRAPHY N/A 09/03/2016   Procedure: Left Heart Cath and Coronary Angiography;  Surgeon: Nelva Bush, MD;  Location: [place] [facility];  Service: Cardiovascular;  Laterality: N/A;  . TONSILLECTOMY      Social History   Socioeconomic History  . Marital status: Widowed    Spouse name: Not on file  . Number of children: Not on file  . Years of education: Not on file  . Highest education level: Not on file  Occupational History  . Not on file  Tobacco Use  . Smoking status: Never Smoker  . Smokeless tobacco: Never Used    Vaping Use  . Vaping Use: Never used  Substance and Sexual Activity  . Alcohol use: No  . Drug use: No  . Sexual activity: Not on file  Other Topics Concern  . Not on file  Social History Narrative  . Not on file   Social Determinants of Health   Financial Resource Strain:   . Difficulty of Paying Living Expenses: Not on file  Food Insecurity:   . Worried About Charity fundraiser in the Last Year: Not on file  . Ran Out of Food in the Last Year: Not on file  Transportation Needs:   . Lack of Transportation (Medical): Not on file  . Lack of Transportation (Non-Medical): Not on file  Physical Activity:   . Days of Exercise per Week: Not on file  . Minutes of Exercise per Session: Not on file  Stress:   . Feeling of Stress : Not on file  Social Connections:   . Frequency of Communication with Friends and Family: Not on file  . Frequency of Social Gatherings with Friends and Family: Not on file  . Attends Religious Services: Not on file  . Active Member of Clubs or Organizations: Not on file  . Attends Archivist Meetings: Not on file  . Marital Status:  Not on file  Intimate Partner Violence:   . Fear of Current or Ex-Partner: Not on file  . Emotionally Abused: Not on file  . Physically Abused: Not on file  . Sexually Abused: Not on file   Family History  Problem Relation Age of Onset  . Heart disease Father       VITAL SIGNS BP (!) 145/62   Pulse 62   Temp (!) 97 F (36.1 C)   Resp 20   Ht 5\' 6"  (1.676 m)   Wt 266 lb (120.7 kg)   SpO2 100%   BMI 42.93 kg/m   Outpatient Encounter Medications as of 04/22/2020  Medication Sig  . acetaminophen (TYLENOL) 500 MG tablet Take 2 tablets (1,000 mg total) by mouth 3 (three) times daily.  Marland Kitchen allopurinol (ZYLOPRIM) 100 MG tablet Take 50 mg by mouth daily. On Monday and Tuesday  . Balsam [place]-Castor Oil (VENELEX) OINT Apply topically in the morning, at noon, and at bedtime. Special Instructions: Apply to sacrum  and bilateral buttocks qshift for Moisture Associated damage.  . Cholecalciferol (VITAMIN D) 50 MCG (2000 UT) CAPS Take 1 capsule (2,000 Units total) by mouth daily.  Marland Kitchen doxycycline (VIBRAMYCIN) 100 MG capsule Take 100 mg by mouth 2 (two) times daily. Special Instructions: for bilateral lower extremity cellulitis  . ELIQUIS 2.5 MG TABS tablet TAKE 1 TABLET(2.5 MG) BY MOUTH TWICE DAILY  . feeding supplement, ENSURE ENLIVE, (ENSURE ENLIVE) LIQD Take 237 mLs by mouth 2 (two) times daily between meals.  . insulin aspart (NOVOLOG FLEXPEN) 100 UNIT/ML FlexPen Inject 5 Units into the skin in the morning, at noon, and at bedtime. Special instructions: Administer if blood sugar >150  . loperamide (IMODIUM) 2 MG capsule Take 2 mg by mouth daily as needed for diarrhea or loose stools.  . metolazone (ZAROXOLYN) 2.5 MG tablet Take 2.5 mg every Wednesday  . metoprolol succinate (TOPROL-XL) 50 MG 24 hr tablet Take 1 tablet (50 mg total) by mouth at bedtime.  . NON FORMULARY 1700cc fluid restriction: dietary to give 840cc nursing to give 7-3= 360cc 3-11= 360cc 11-7= 140cc Special Instructions: Document fluid given each shift Every Shift Day, Evening, Night  . ondansetron (ZOFRAN) 4 MG tablet Take 1 tablet (4 mg total) by mouth every 6 (six) hours as needed for nausea.  . OXYGEN Inhale 2 L into the lungs continuous. Document O2 sat every shift  . potassium chloride SA (KLOR-CON) 20 MEQ tablet Take 40 mEq by mouth daily. For Hypokalemia  . rosuvastatin (CRESTOR) 20 MG tablet Take 1 tablet (20 mg total) by mouth daily.  . sodium bicarbonate 650 MG tablet Take 650 mg by mouth 3 (three) times daily.  Marland Kitchen torsemide (DEMADEX) 100 MG tablet Take 100 mg by mouth 2 (two) times daily. Special Instructions: heart failure  . TOUJEO SOLOSTAR 300 UNIT/ML SOPN INJECT 60 UNITS INTO THE SKIN AT BEDTIME  . traMADol (ULTRAM) 50 MG tablet Take 0.5 tablets (25 mg total) by mouth 3 (three) times daily.  Marland Kitchen UNABLE TO FIND Diet: NAS  .  [DISCONTINUED] torsemide (DEMADEX) 20 MG tablet Take 5 tablets (100 mg total) by mouth 2 (two) times daily.   No facility-administered encounter medications on file as of 04/22/2020.     SIGNIFICANT DIAGNOSTIC EXAMS   PREVIOUS   04-08-20: chest x-ray:  1. Small bilateral pleural effusions with bibasilar atelectasis. 2. Diffuse interstitial prominence likely reflects pulmonary edema.  04-09-20: 2-d echo:  Left ventricular ejection fraction, by estimation, is 60 to 65%.  The  left ventricle has normal function. The left ventricle has no regional  wall motion abnormalities. There is mild left ventricular hypertrophy.  Left ventricular diastolic parameters  are indeterminate in the setting of atrial fibrillation.   NO NEW EXAMS.   PREVIOUS   04-08-20: wbc 10.2; hgb 8.0; hct 28.1; mcv 94.9 plt 361; glucose 185; bun 60; creat 2.96; k+ 2.9; na++ 140; ca 8.6 liver normal albumin 2.0; hgb a1c 7.5 BNP 468.0 mag 1.6 04-10-20: glucose 209; bun 64; creat 2.88; k+ 3.3; na++ 136; ca 8.6 04-12-20: glucose 182; bun 74; creat 2.82; k+ 2.4; na++ 135; ca 8.9 mag 1.8 04-13-20: glucose 190; bun 78; creat 2.97; k+ 3.2; na++ 132; ca 8.8 04-14-20; wbc 11.1; hgb 7.2; hct 25.5; mcv 94.4 plt 347; glucose 201; bun 86; creat 3.10; k+ 3.8; na++ 133; ca 8.7 mag 1.9 04-15-20: glucose 203; bun 92; creat 3.39 k+ 3.6; na++ 130; ca 9.0   TODAY  04-21-20: glucose 103; bun 106; creat 3.73; k+ 2.5; na++ 134; ca 8.9 uric acid 5.7  04-22-20: glucose 119; bun 107; creat 3.83; k+ 3.3; na++ 134 ca 8.9   Review of Systems  Constitutional: Positive for malaise/fatigue.  Respiratory: Negative for cough and shortness of breath.   Cardiovascular: Negative for chest pain, palpitations and leg swelling.  Gastrointestinal: Negative for abdominal pain, constipation and heartburn.  Musculoskeletal: Negative for back pain, joint pain and myalgias.  Skin: Negative.   Neurological: Positive for weakness. Negative for dizziness.    Psychiatric/Behavioral: The patient is not nervous/anxious.     Physical Exam Constitutional:      General: She is not in acute distress.    Appearance: She is well-developed. She is morbidly obese. She is not diaphoretic.  Neck:     Thyroid: No thyromegaly.  Cardiovascular:     Rate and Rhythm: Normal rate. Rhythm irregular.     Pulses: Normal pulses.     Heart sounds: Murmur heard.   Pulmonary:     Effort: Pulmonary effort is normal. No respiratory distress.     Breath sounds: Normal breath sounds.     Comments: 02 Abdominal:     General: Bowel sounds are normal. There is no distension.     Palpations: Abdomen is soft.     Tenderness: There is no abdominal tenderness.  Musculoskeletal:     Cervical back: Neck supple.     Right lower leg: Edema present.     Left lower leg: Edema present.     Comments: Bilateral lower extremity lymphedema Is able to move all extremities      Lymphadenopathy:     Cervical: No cervical adenopathy.  Skin:    General: Skin is warm and dry.     Comments: Bilateral lower extremities: upper and lower sections with areas that are red hot and firm to to palpation    Neurological:     Mental Status: She is alert and oriented to person, place, and time.  Psychiatric:        Mood and Affect: Mood normal.        ASSESSMENT/ PLAN:   TODAY  1. Hypokalemia: is without change k+ 3.3 will increase k+ to 40 meq twice daily and will monitor her labs,   2. Acute on chronic diastolic heart failure is stable EF 60-65% (04-09-20) will continue demadex 100 mg twice daily with metolazone 2.5 mg three times weekly; toprol xl 50 mg daily is on 02.   3. Persistent atrial fibrillation: is stable will  continue toprol xl 50 mg daily for rate control eliquis 2.5 mg twice daily   PREVIOUS   4. Coronary artery disease involving native coronary artery of native heart with unstable angina pectoris: is stable will continue toprol xl 50 mg daily  5. Hypertension  associated with stage 5 chronic kidney disease due to type 2 diabetes mellitus: is stable b/p 145/62: will continue norvasc 5 mg daily toprol xl 50 mg daily   6. Acute respiratory failure with hypoxia: is stable is on 02 will monitor   7. CKD stage 5 due to type 2 diabetes mellitus: is stable bun 92; creat 3.39 will monitor is on sodium bicarb 650 gm three times daily   8. Obesity class III BMI 40-49.9 (morbid obesity) without change: BMI 42.93  9. Chronic gout due to renal impairment of multiple sites without tophi: is stable will continue allopurinol 50 mg twice  tylenol 1 gm three times daily ultram 25 mg three times daily   10. Hyperlipidemia associated with type 2 diabetes mellitus: is stable will continue crestor 20 mg daily   11. Type 2 diabetes mellitus with stage 5 chronic kidney disease not on chronic dialysis with long term current use of insulin: is stable hgb a1c 7.5: will continue novolog 5 units with meals for cbg >150; toujeo 60 units nightly   12. Acquired lymphedema os lower extremity: is stable bilateral lower legs with less edema present; legs are discolored  13. Anemia due to stage 5 chronic kidney disease treated with darbepoetin: is without change: hgb 7.2 will continue aranesp every other week.   Will continue her abt for her cellulitis   MD is aware of resident's narcotic use and is in agreement with current plan of care. We will attempt to wean resident as appropriate.  Ok Edwards NP [facility]  Contact [phone] Monday through Friday 8am- 5pm  After hours call [phone]

## 2020-04-23 DIAGNOSIS — I5032 Chronic diastolic (congestive) heart failure: Secondary | ICD-10-CM | POA: Diagnosis not present

## 2020-04-23 DIAGNOSIS — N185 Chronic kidney disease, stage 5: Secondary | ICD-10-CM | POA: Diagnosis not present

## 2020-04-23 DIAGNOSIS — Z515 Encounter for palliative care: Secondary | ICD-10-CM | POA: Diagnosis not present

## 2020-04-25 ENCOUNTER — Telehealth: Payer: Self-pay | Admitting: Cardiology

## 2020-04-25 ENCOUNTER — Encounter (HOSPITAL_COMMUNITY)
Admission: RE | Admit: 2020-04-25 | Discharge: 2020-04-25 | Disposition: A | Discharge status: Home / Self Care | Payer: Medicare Other | Source: Skilled Nursing Facility | Attending: Adult Health | Admitting: Adult Health

## 2020-04-25 DIAGNOSIS — D631 Anemia in chronic kidney disease: Secondary | ICD-10-CM | POA: Insufficient documentation

## 2020-04-25 LAB — BASIC METABOLIC PANEL
Anion gap: 16 — ABNORMAL HIGH (ref 5–15)
BUN: 107 mg/dL — ABNORMAL HIGH (ref 8–23)
CO2: 33 mmol/L — ABNORMAL HIGH (ref 22–32)
Calcium: 9.3 mg/dL (ref 8.9–10.3)
Chloride: 90 mmol/L — ABNORMAL LOW (ref 98–111)
Creatinine, Ser: 3.82 mg/dL — ABNORMAL HIGH (ref 0.44–1.00)
GFR calc non Af Amer: 10 mL/min — ABNORMAL LOW (ref >60)
Glucose, Bld: 81 mg/dL (ref 70–99)
Potassium: 3.6 mmol/L (ref 3.5–5.1)
Sodium: 139 mmol/L (ref 135–145)

## 2020-04-25 LAB — MAGNESIUM: Magnesium: 1.7 mg/dL (ref 1.7–2.4)

## 2020-04-25 NOTE — Telephone Encounter (Signed)
New message    Would like to speak with nurse about patient labs

## 2020-04-25 NOTE — Telephone Encounter (Signed)
Thank you for letting me know

## 2020-04-25 NOTE — Telephone Encounter (Signed)
I spoke with Estill Bamberg from the [facility] and she reports patient is now on hospice and they would like to cancel future appointments.

## 2020-04-30 ENCOUNTER — Other Ambulatory Visit: Payer: Self-pay | Admitting: Adult Health

## 2020-04-30 ENCOUNTER — Non-Acute Institutional Stay (SKILLED_NURSING_FACILITY): Payer: Medicare Other | Admitting: Adult Health

## 2020-04-30 ENCOUNTER — Encounter: Payer: Self-pay | Admitting: Adult Health

## 2020-04-30 DIAGNOSIS — R627 Adult failure to thrive: Secondary | ICD-10-CM | POA: Diagnosis not present

## 2020-04-30 DIAGNOSIS — J9621 Acute and chronic respiratory failure with hypoxia: Secondary | ICD-10-CM

## 2020-04-30 DIAGNOSIS — I5021 Acute systolic (congestive) heart failure: Secondary | ICD-10-CM

## 2020-04-30 MED ORDER — MORPHINE SULFATE (CONCENTRATE) 20 MG/ML PO SOLN
5.0000 mg | ORAL | 0 refills | Status: AC | PRN
Start: 2020-04-30 — End: ?

## 2020-04-30 NOTE — Progress Notes (Signed)
Location:    [facility] Room Number: 135/P Place of Service:  SNF (31)   CODE STATUS: DNR  No Known Allergies  Chief Complaint  Patient presents with  . Short Term Rehab (STR)         Failure to thrive in adult:    . Acute on chronic diastolic heart failure:   Acute on chronic respiratory failure with hypoxia    Weekly follow up for the first 30 days post hospitalization.     HPI:  She is a 84 year old long term resident of this facility being seen for the management of her chronic illnesses:Failure to thrive in adult:    . Acute on chronic diastolic heart failure:   Acute on chronic respiratory failure with hypoxia. She is being followed by hospice care. She is having pain when touched; she states the pain is intense and sharp.  She denies any anxiety. There are no reports of insomnia.   Past Medical History:  Diagnosis Date  . Atrial fibrillation ([place])   . CAD (coronary artery disease)    Multivessel, DES to LAD and RCA 08/2016  . Cardiomyopathy (HCC)    LVEF 45%  . CKD (chronic kidney disease) stage 5, GFR less than 15 ml/min (HCC)   . Essential hypertension   . Gout   . Pancreatitis, recurrent   . Type 2 diabetes mellitus ([place])     Past Surgical History:  Procedure Laterality Date  . CHOLECYSTECTOMY    . CORONARY STENT INTERVENTION N/A 09/08/2016   Procedure: Coronary Stent Intervention;  Surgeon: Belva Crome, MD;  Location: [place] [facility];  Service: Cardiovascular;  Laterality: N/A;  . LEFT HEART CATH AND CORONARY ANGIOGRAPHY N/A 09/03/2016   Procedure: Left Heart Cath and Coronary Angiography;  Surgeon: Nelva Bush, MD;  Location: [place] [facility];  Service: Cardiovascular;  Laterality: N/A;  . TONSILLECTOMY      Social History   Socioeconomic History  . Marital status: Widowed    Spouse name: Not on file  . Number of children: Not on file  . Years of education: Not on file  . Highest education level: Not on file  Occupational  History  . Not on file  Tobacco Use  . Smoking status: Never Smoker  . Smokeless tobacco: Never Used  Vaping Use  . Vaping Use: Never used  Substance and Sexual Activity  . Alcohol use: No  . Drug use: No  . Sexual activity: Not on file  Other Topics Concern  . Not on file  Social History Narrative  . Not on file   Social Determinants of Health   Financial Resource Strain:   . Difficulty of Paying Living Expenses: Not on file  Food Insecurity:   . Worried About Charity fundraiser in the Last Year: Not on file  . Ran Out of Food in the Last Year: Not on file  Transportation Needs:   . Lack of Transportation (Medical): Not on file  . Lack of Transportation (Non-Medical): Not on file  Physical Activity:   . Days of Exercise per Week: Not on file  . Minutes of Exercise per Session: Not on file  Stress:   . Feeling of Stress : Not on file  Social Connections:   . Frequency of Communication with Friends and Family: Not on file  . Frequency of Social Gatherings with Friends and Family: Not on file  . Attends Religious Services: Not on file  .  Active Member of Clubs or Organizations: Not on file  . Attends Archivist Meetings: Not on file  . Marital Status: Not on file  Intimate Partner Violence:   . Fear of Current or Ex-Partner: Not on file  . Emotionally Abused: Not on file  . Physically Abused: Not on file  . Sexually Abused: Not on file   Family History  Problem Relation Age of Onset  . Heart disease Father       VITAL SIGNS BP (!) 153/70   Pulse 78   Temp 97.9 F (36.6 C)   Resp 20   Ht 5\' 6"  (1.676 m)   Wt 266 lb (120.7 kg)   SpO2 93%   BMI 42.93 kg/m   Outpatient Encounter Medications as of 04/30/2020  Medication Sig  . acetaminophen (TYLENOL) 500 MG tablet Take 2 tablets (1,000 mg total) by mouth 3 (three) times daily.  Roseanne Kaufman Peru-Castor Oil (VENELEX) OINT Apply topically in the morning, at noon, and at bedtime. Special Instructions:  Apply to sacrum and bilateral buttocks qshift for Moisture Associated damage.  Marland Kitchen doxycycline (VIBRAMYCIN) 100 MG capsule Take 100 mg by mouth 2 (two) times daily. Special Instructions: for bilateral lower extremity cellulitis  . ELIQUIS 2.5 MG TABS tablet TAKE 1 TABLET(2.5 MG) BY MOUTH TWICE DAILY  . feeding supplement, ENSURE ENLIVE, (ENSURE ENLIVE) LIQD Take 237 mLs by mouth 2 (two) times daily between meals.  Marland Kitchen loperamide (IMODIUM) 2 MG capsule Take 2 mg by mouth daily as needed for diarrhea or loose stools.  Marland Kitchen LORazepam (ATIVAN) 0.5 MG tablet Take 0.5 mg by mouth every 6 (six) hours as needed for anxiety (Agitation).  . metolazone (ZAROXOLYN) 2.5 MG tablet Take 2.5 mg every Wednesday  . morphine (ROXANOL) 20 MG/ML concentrated solution Take by mouth every 3 (three) hours as needed for severe pain. Give 0.5 ml for pain per hospice orders  . NON FORMULARY 1700cc fluid restriction: dietary to give 840cc nursing to give 7-3= 360cc 3-11= 360cc 11-7= 140cc Special Instructions: Document fluid given each shift Every Shift Day, Evening, Night  . ondansetron (ZOFRAN) 4 MG tablet Take 1 tablet (4 mg total) by mouth every 6 (six) hours as needed for nausea.  . OXYGEN Inhale 2 L into the lungs continuous. Document O2 sat every shift  . torsemide (DEMADEX) 100 MG tablet Take 100 mg by mouth 2 (two) times daily. Special Instructions: heart failure  . UNABLE TO FIND Diet: NAS  . [DISCONTINUED] allopurinol (ZYLOPRIM) 100 MG tablet Take 50 mg by mouth daily. On Monday and Tuesday  . [DISCONTINUED] Cholecalciferol (VITAMIN D) 50 MCG (2000 UT) CAPS Take 1 capsule (2,000 Units total) by mouth daily.  . [DISCONTINUED] insulin aspart (NOVOLOG FLEXPEN) 100 UNIT/ML FlexPen Inject 5 Units into the skin in the morning, at noon, and at bedtime. Special instructions: Administer if blood sugar >150  . [DISCONTINUED] metoprolol succinate (TOPROL-XL) 50 MG 24 hr tablet Take 1 tablet (50 mg total) by mouth at bedtime.  .  [DISCONTINUED] potassium chloride SA (KLOR-CON) 20 MEQ tablet Take 40 mEq by mouth daily. For Hypokalemia  . [DISCONTINUED] rosuvastatin (CRESTOR) 20 MG tablet Take 1 tablet (20 mg total) by mouth daily.  . [DISCONTINUED] sodium bicarbonate 650 MG tablet Take 650 mg by mouth 3 (three) times daily.  . [DISCONTINUED] TOUJEO SOLOSTAR 300 UNIT/ML SOPN INJECT 60 UNITS INTO THE SKIN AT BEDTIME  . [DISCONTINUED] traMADol (ULTRAM) 50 MG tablet Take 0.5 tablets (25 mg total) by mouth 3 (three)  times daily.   No facility-administered encounter medications on file as of 04/30/2020.     SIGNIFICANT DIAGNOSTIC EXAMS   PREVIOUS   04-08-20: chest x-ray:  1. Small bilateral pleural effusions with bibasilar atelectasis. 2. Diffuse interstitial prominence likely reflects pulmonary edema.  04-09-20: 2-d echo:  Left ventricular ejection fraction, by estimation, is 60 to 65%. The  left ventricle has normal function. The left ventricle has no regional  wall motion abnormalities. There is mild left ventricular hypertrophy.  Left ventricular diastolic parameters  are indeterminate in the setting of atrial fibrillation.   NO NEW EXAMS.   PREVIOUS   04-08-20: wbc 10.2; hgb 8.0; hct 28.1; mcv 94.9 plt 361; glucose 185; bun 60; creat 2.96; k+ 2.9; na++ 140; ca 8.6 liver normal albumin 2.0; hgb a1c 7.5 BNP 468.0 mag 1.6 04-10-20: glucose 209; bun 64; creat 2.88; k+ 3.3; na++ 136; ca 8.6 04-12-20: glucose 182; bun 74; creat 2.82; k+ 2.4; na++ 135; ca 8.9 mag 1.8 04-13-20: glucose 190; bun 78; creat 2.97; k+ 3.2; na++ 132; ca 8.8 04-14-20; wbc 11.1; hgb 7.2; hct 25.5; mcv 94.4 plt 347; glucose 201; bun 86; creat 3.10; k+ 3.8; na++ 133; ca 8.7 mag 1.9 04-15-20: glucose 203; bun 92; creat 3.39 k+ 3.6; na++ 130; ca 9.0  04-21-20: glucose 103; bun 106; creat 3.73; k+ 2.5; na++ 134; ca 8.9 uric acid 5.7  04-22-20: glucose 119; bun 107; creat 3.83; k+ 3.3; na++ 134 ca 8.9   NO NEW LABS.   Review of Systems  Constitutional:  Positive for malaise/fatigue.  Respiratory: Negative for cough and shortness of breath.   Cardiovascular: Negative for chest pain, palpitations and leg swelling.  Gastrointestinal: Negative for abdominal pain, constipation and heartburn.  Musculoskeletal: Negative for back pain, joint pain and myalgias.  Skin: Negative.   Neurological: Positive for weakness. Negative for dizziness.  Psychiatric/Behavioral: The patient is not nervous/anxious.     Physical Exam Constitutional:      General: She is not in acute distress.    Appearance: She is well-developed. She is obese. She is not diaphoretic.  Neck:     Thyroid: No thyromegaly.  Cardiovascular:     Rate and Rhythm: Normal rate. Rhythm irregular.     Pulses: Normal pulses.     Heart sounds: Murmur heard.   Pulmonary:     Effort: Pulmonary effort is normal. No respiratory distress.     Breath sounds: Normal breath sounds.     Comments: 02 Abdominal:     General: Bowel sounds are normal. There is no distension.     Palpations: Abdomen is soft.     Tenderness: There is no abdominal tenderness.  Musculoskeletal:     Cervical back: Neck supple.     Right lower leg: Edema present.     Left lower leg: Edema present.     Comments: Bilateral lower extremity lymphedema Is able to move all extremities       Lymphadenopathy:     Cervical: No cervical adenopathy.  Skin:    General: Skin is warm and dry.  Neurological:     Mental Status: She is alert and oriented to person, place, and time.  Psychiatric:        Mood and Affect: Mood normal.       ASSESSMENT/ PLAN:  TODAY  1. Failure to thrive in adult: is declining is now followed by hospice care. Will continue to focus her care upon comfort  2. Acute on chronic diastolic heart failure: is  stable EF 60-65 % (04-09-20): will stop demadex and metolazone fluid restriction and daily weights.  Will change roxanol to 5 mg every 6 hours routinely and every 3 hours as needed.    3.  Acute on chronic respiratory failure with hypoxia: is without change is 02 dependent   PREVIOUS   4. Coronary artery disease involving native coronary artery of native heart with unstable angina pectoris: is stable will monitor   5. Hypertension associated with stage 5 chronic kidney disease due to type 2 diabetes mellitus: is stable b/p 153/70: will continue to monitor   6. CKD stage 5 due to type 2 diabetes mellitus: is stable bun 107; creat 3.83 will monitor   7. Obesity class III BMI 40-49.9 (morbid obesity) without change: BMI 42.93  8. 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 Hyperlipidemia associated with type 2 diabetes mellitus: is stable will monitor    10. Type 2 diabetes mellitus with stage 5 chronic kidney disease not on chronic dialysis with long term current use of insulin: is stable hgb a1c 7.5: will monitor   11. Acquired lymphedema os lower extremity: is stable bilateral lower legs with less edema present; legs are discolored  12. Anemia due to stage 5 chronic kidney disease treated with darbepoetin: is without change: hgb 7.2 will monitor    13. Hypokalemia: is without change k+ 3.3 is off medications will monitor   14. Persistent atrial fibrillation: is stable will stop eliquis is off toprol    For her pain with touch: will begin her on gabapentin 100 mg nightly and will monitor her response; will not be able to give her any more this medication due to her renal function.    MD is aware of resident's narcotic use and is in agreement with current plan of care. We will attempt to wean resident as appropriate.  Ok Edwards NP [facility]  Contact [phone] Monday through Friday 8am- 5pm  After hours call [phone]

## 2020-05-01 ENCOUNTER — Non-Acute Institutional Stay (SKILLED_NURSING_FACILITY): Payer: Medicare Other | Admitting: Adult Health

## 2020-05-01 DIAGNOSIS — R627 Adult failure to thrive: Secondary | ICD-10-CM

## 2020-05-01 DIAGNOSIS — J9621 Acute and chronic respiratory failure with hypoxia: Secondary | ICD-10-CM

## 2020-05-01 DIAGNOSIS — I5022 Chronic systolic (congestive) heart failure: Secondary | ICD-10-CM

## 2020-05-02 ENCOUNTER — Non-Acute Institutional Stay (SKILLED_NURSING_FACILITY): Payer: Medicare Other | Admitting: Adult Health

## 2020-05-02 DIAGNOSIS — I25110 Atherosclerotic heart disease of native coronary artery with unstable angina pectoris: Secondary | ICD-10-CM

## 2020-05-02 DIAGNOSIS — I499 Cardiac arrhythmia, unspecified: Secondary | ICD-10-CM | POA: Diagnosis not present

## 2020-05-02 DIAGNOSIS — N185 Chronic kidney disease, stage 5: Secondary | ICD-10-CM

## 2020-05-02 DIAGNOSIS — I5033 Acute on chronic diastolic (congestive) heart failure: Secondary | ICD-10-CM | POA: Diagnosis not present

## 2020-05-02 DIAGNOSIS — Z743 Need for continuous supervision: Secondary | ICD-10-CM | POA: Diagnosis not present

## 2020-05-02 DIAGNOSIS — I5022 Chronic systolic (congestive) heart failure: Secondary | ICD-10-CM | POA: Insufficient documentation

## 2020-05-02 DIAGNOSIS — E1122 Type 2 diabetes mellitus with diabetic chronic kidney disease: Secondary | ICD-10-CM | POA: Diagnosis not present

## 2020-05-02 DIAGNOSIS — R627 Adult failure to thrive: Secondary | ICD-10-CM | POA: Insufficient documentation

## 2020-05-02 DIAGNOSIS — R410 Disorientation, unspecified: Secondary | ICD-10-CM | POA: Diagnosis not present

## 2020-05-02 DIAGNOSIS — R0902 Hypoxemia: Secondary | ICD-10-CM | POA: Diagnosis not present

## 2020-05-02 NOTE — Progress Notes (Signed)
Location:   [facility] Room Number: 938 BOFBP of Service:  SNF (31)   CODE STATUS: dnr  No Known Allergies  Chief Complaint  Patient presents with  . Acute Visit    care plan meeting     HPI:  We have come together for her care plan meeting. BIMS 11/15 mood 6/30. She is followed by hospice care. No falls. Is total care with adls; is able to feed self after setup. Is incontinent of bladder and bowel. There are no reports of uncontrolled pain is on roxanol and gabapentin. No reports of anxiety or agitation. She continues to be followed for her chronic illnesses including:  Failure to thrive in adult Chronic systolic congestive heart failure Acute and chronic respiratory failure with hypoxia   Past Medical History:  Diagnosis Date  . Atrial fibrillation ([place])   . CAD (coronary artery disease)    Multivessel, DES to LAD and RCA 08/2016  . Cardiomyopathy (HCC)    LVEF 45%  . CKD (chronic kidney disease) stage 5, GFR less than 15 ml/min (HCC)   . Essential hypertension   . Gout   . Pancreatitis, recurrent   . Type 2 diabetes mellitus ([place])     Past Surgical History:  Procedure Laterality Date  . CHOLECYSTECTOMY    . CORONARY STENT INTERVENTION N/A 09/08/2016   Procedure: Coronary Stent Intervention;  Surgeon: Belva Crome, MD;  Location: [place] [facility];  Service: Cardiovascular;  Laterality: N/A;  . LEFT HEART CATH AND CORONARY ANGIOGRAPHY N/A 09/03/2016   Procedure: Left Heart Cath and Coronary Angiography;  Surgeon: Nelva Bush, MD;  Location: [place] [facility];  Service: Cardiovascular;  Laterality: N/A;  . TONSILLECTOMY      Social History   Socioeconomic History  . Marital status: Widowed    Spouse name: Not on file  . Number of children: Not on file  . Years of education: Not on file  . Highest education level: Not on file  Occupational History  . Not on file  Tobacco Use  . Smoking status: Never Smoker  . Smokeless tobacco: Never  Used  Vaping Use  . Vaping Use: Never used  Substance and Sexual Activity  . Alcohol use: No  . Drug use: No  . Sexual activity: Not on file  Other Topics Concern  . Not on file  Social History Narrative  . Not on file   Social Determinants of Health   Financial Resource Strain:   . Difficulty of Paying Living Expenses: Not on file  Food Insecurity:   . Worried About Charity fundraiser in the Last Year: Not on file  . Ran Out of Food in the Last Year: Not on file  Transportation Needs:   . Lack of Transportation (Medical): Not on file  . Lack of Transportation (Non-Medical): Not on file  Physical Activity:   . Days of Exercise per Week: Not on file  . Minutes of Exercise per Session: Not on file  Stress:   . Feeling of Stress : Not on file  Social Connections:   . Frequency of Communication with Friends and Family: Not on file  . Frequency of Social Gatherings with Friends and Family: Not on file  . Attends Religious Services: Not on file  . Active Member of Clubs or Organizations: Not on file  . Attends Archivist Meetings: Not on file  . Marital Status: Not on file  Intimate Partner Violence:   .  Fear of Current or Ex-Partner: Not on file  . Emotionally Abused: Not on file  . Physically Abused: Not on file  . Sexually Abused: Not on file   Family History  Problem Relation Age of Onset  . Heart disease Father       VITAL SIGNS BP 132/80   Pulse 88   Temp 97.6 F (36.4 C)   Ht 5\' 6"  (1.676 m)   Wt 266 lb (120.7 kg)   BMI 42.93 kg/m   Outpatient Encounter Medications as of 05/01/2020  Medication Sig  . acetaminophen (TYLENOL) 500 MG tablet Take 2 tablets (1,000 mg total) by mouth 3 (three) times daily.  Roseanne Kaufman Peru-Castor Oil (VENELEX) OINT Apply topically in the morning, at noon, and at bedtime. Special Instructions: Apply to sacrum and bilateral buttocks qshift for Moisture Associated damage.  . feeding supplement, ENSURE ENLIVE, (ENSURE  ENLIVE) LIQD Take 237 mLs by mouth 2 (two) times daily between meals.  Marland Kitchen loperamide (IMODIUM) 2 MG capsule Take 2 mg by mouth daily as needed for diarrhea or loose stools.  Marland Kitchen LORazepam (ATIVAN) 0.5 MG tablet Take 0.5 mg by mouth every 6 (six) hours as needed for anxiety (Agitation).  . morphine (ROXANOL) 20 MG/ML concentrated solution Take 0.25 mLs (5 mg total) by mouth every 3 (three) hours as needed for severe pain (AND give 5 mg eveyr 6 hours routinely). Give 0.5 ml for pain per hospice orders  . ondansetron (ZOFRAN) 4 MG tablet Take 1 tablet (4 mg total) by mouth every 6 (six) hours as needed for nausea.  . OXYGEN Inhale 2 L into the lungs continuous. Document O2 sat every shift  . UNABLE TO FIND Diet: NAS  . [DISCONTINUED] doxycycline (VIBRAMYCIN) 100 MG capsule Take 100 mg by mouth 2 (two) times daily. Special Instructions: for bilateral lower extremity cellulitis  . [DISCONTINUED] ELIQUIS 2.5 MG TABS tablet TAKE 1 TABLET(2.5 MG) BY MOUTH TWICE DAILY  . [DISCONTINUED] metolazone (ZAROXOLYN) 2.5 MG tablet Take 2.5 mg every Wednesday  . [DISCONTINUED] NON FORMULARY 1700cc fluid restriction: dietary to give 840cc nursing to give 7-3= 360cc 3-11= 360cc 11-7= 140cc Special Instructions: Document fluid given each shift Every Shift Day, Evening, Night  . [DISCONTINUED] torsemide (DEMADEX) 100 MG tablet Take 100 mg by mouth 2 (two) times daily. Special Instructions: heart failure   No facility-administered encounter medications on file as of 05/01/2020.     SIGNIFICANT DIAGNOSTIC EXAMS   PREVIOUS   04-08-20: chest x-ray:  1. Small bilateral pleural effusions with bibasilar atelectasis. 2. Diffuse interstitial prominence likely reflects pulmonary edema.  04-09-20: 2-d echo:  Left ventricular ejection fraction, by estimation, is 60 to 65%. The  left ventricle has normal function. The left ventricle has no regional  wall motion abnormalities. There is mild left ventricular hypertrophy.  Left  ventricular diastolic parameters  are indeterminate in the setting of atrial fibrillation.   NO NEW EXAMS.   PREVIOUS   04-08-20: wbc 10.2; hgb 8.0; hct 28.1; mcv 94.9 plt 361; glucose 185; bun 60; creat 2.96; k+ 2.9; na++ 140; ca 8.6 liver normal albumin 2.0; hgb a1c 7.5 BNP 468.0 mag 1.6 04-10-20: glucose 209; bun 64; creat 2.88; k+ 3.3; na++ 136; ca 8.6 04-12-20: glucose 182; bun 74; creat 2.82; k+ 2.4; na++ 135; ca 8.9 mag 1.8 04-13-20: glucose 190; bun 78; creat 2.97; k+ 3.2; na++ 132; ca 8.8 04-14-20; wbc 11.1; hgb 7.2; hct 25.5; mcv 94.4 plt 347; glucose 201; bun 86; creat 3.10; k+ 3.8; na++  133; ca 8.7 mag 1.9 04-15-20: glucose 203; bun 92; creat 3.39 k+ 3.6; na++ 130; ca 9.0  04-21-20: glucose 103; bun 106; creat 3.73; k+ 2.5; na++ 134; ca 8.9 uric acid 5.7  04-22-20: glucose 119; bun 107; creat 3.83; k+ 3.3; na++ 134 ca 8.9   NO NEW LABS.   Review of Systems  Constitutional: Negative for malaise/fatigue.  Respiratory: Negative for cough and shortness of breath.   Cardiovascular: Negative for chest pain, palpitations and leg swelling.  Gastrointestinal: Negative for abdominal pain, constipation and heartburn.  Musculoskeletal: Negative for back pain, joint pain and myalgias.  Skin: Negative.   Neurological: Negative for dizziness.  Psychiatric/Behavioral: The patient is not nervous/anxious.     Physical Exam Constitutional:      General: She is not in acute distress.    Appearance: She is well-developed. She is morbidly obese. She is not diaphoretic.  Neck:     Thyroid: No thyromegaly.  Cardiovascular:     Rate and Rhythm: Normal rate. Rhythm irregular.     Pulses: Normal pulses.     Heart sounds: Murmur heard.   Pulmonary:     Effort: Pulmonary effort is normal. No respiratory distress.     Breath sounds: Normal breath sounds.     Comments: 02 Abdominal:     General: Bowel sounds are normal. There is no distension.     Palpations: Abdomen is soft.     Tenderness: There  is no abdominal tenderness.  Musculoskeletal:     Cervical back: Neck supple.     Right lower leg: Edema present.     Left lower leg: Edema present.     Comments: Bilateral lower extremity lymphedema Is able to move all extremities        Lymphadenopathy:     Cervical: No cervical adenopathy.  Skin:    General: Skin is warm and dry.  Neurological:     Mental Status: She is alert and oriented to person, place, and time.  Psychiatric:        Mood and Affect: Mood normal.       ASSESSMENT/ PLAN:  TODAY  1. Failure to thrive in adult 2. Chronic systolic congestive heart failure 3. Acute and chronic respiratory failure with hypoxia  Will continue current medications Will continue to be followed by hospice care Will continue to monitor her status.   MD is aware of resident's narcotic use and is in agreement with current plan of care. We will attempt to wean resident as appropriate.  Ok Edwards NP [facility]  Contact [phone] Monday through Friday 8am- 5pm  After hours call [phone]

## 2020-05-02 NOTE — Progress Notes (Deleted)
Location:      Place of Service:  SNF (31)    CODE STATUS: ***  No Known Allergies  No chief complaint on file.   HPI:  Past Medical History:  Diagnosis Date   Atrial fibrillation ([place])    CAD (coronary artery disease)    Multivessel, DES to LAD and RCA 08/2016   Cardiomyopathy (HCC)    LVEF 45%   CKD (chronic kidney disease) stage 5, GFR less than 15 ml/min (HCC)    Essential hypertension    Gout    Pancreatitis, recurrent    Type 2 diabetes mellitus ([place])     Past Surgical History:  Procedure Laterality Date   CHOLECYSTECTOMY     CORONARY STENT INTERVENTION N/A 09/08/2016   Procedure: Coronary Stent Intervention;  Surgeon: Belva Crome, MD;  Location: [facility] [place] [facility];  Service: Cardiovascular;  Laterality: N/A;   LEFT HEART CATH AND CORONARY ANGIOGRAPHY N/A 09/03/2016   Procedure: Left Heart Cath and Coronary Angiography;  Surgeon: Nelva Bush, MD;  Location: [facility];  Service: Cardiovascular;  Laterality: N/A;   TONSILLECTOMY      Social History   Socioeconomic History   Marital status: Widowed    Spouse name: Not on file   Number of children: Not on file   Years of education: Not on file   Highest education level: Not on file  Occupational History   Not on file  Tobacco Use   Smoking status: Never Smoker   Smokeless tobacco: Never Used  Vaping Use   Vaping Use: Never used  Substance and Sexual Activity   Alcohol use: No   Drug use: No   Sexual activity: Not on file  Other Topics Concern   Not on file  Social History Narrative   Not on file   Social Determinants of Health   Financial Resource Strain:    Difficulty of Paying Living Expenses: Not on file  Food Insecurity:    Worried About [place] in the Last Year: Not on file   Ran Out of Food in the Last Year: Not on file  Transportation Needs:    Lack of Transportation (Medical): Not on file   Lack of Transportation (Non-Medical):  Not on file  Physical Activity:    Days of Exercise per Week: Not on file   Minutes of Exercise per Session: Not on file  Stress:    Feeling of Stress : Not on file  Social Connections:    Frequency of Communication with Friends and Family: Not on file   Frequency of Social Gatherings with Friends and Family: Not on file   Attends Religious Services: Not on file   Active Member of Clubs or Organizations: Not on file   Attends Archivist Meetings: Not on file   Marital Status: Not on file  Intimate Partner Violence:    Fear of Current or Ex-Partner: Not on file   Emotionally Abused: Not on file   Physically Abused: Not on file   Sexually Abused: Not on file   Family History  Problem Relation Age of Onset   Heart disease Father     VITAL SIGNS There were no vitals taken for this visit.  Patient's Medications  New Prescriptions   No medications on file  Previous Medications   ACETAMINOPHEN (TYLENOL) 500 MG TABLET    Take 2 tablets (1,000 mg total) by mouth 3 (three) times daily.   BALSAM PERU-CASTOR OIL (VENELEX) OINT  Apply topically in the morning, at noon, and at bedtime. Special Instructions: Apply to sacrum and bilateral buttocks qshift for Moisture Associated damage.   FEEDING SUPPLEMENT, ENSURE ENLIVE, (ENSURE ENLIVE) LIQD    Take 237 mLs by mouth 2 (two) times daily between meals.   LOPERAMIDE (IMODIUM) 2 MG CAPSULE    Take 2 mg by mouth daily as needed for diarrhea or loose stools.   LORAZEPAM (ATIVAN) 0.5 MG TABLET    Take 0.5 mg by mouth every 6 (six) hours as needed for anxiety (Agitation).   MORPHINE (ROXANOL) 20 MG/ML CONCENTRATED SOLUTION    Take 0.25 mLs (5 mg total) by mouth every 3 (three) hours as needed for severe pain (AND give 5 mg eveyr 6 hours routinely). Give 0.5 ml for pain per hospice orders   ONDANSETRON (ZOFRAN) 4 MG TABLET    Take 1 tablet (4 mg total) by mouth every 6 (six) hours as needed for nausea.   OXYGEN    Inhale 2 L  into the lungs continuous. Document O2 sat every shift   UNABLE TO FIND    Diet: NAS  Modified Medications   No medications on file  Discontinued Medications   No medications on file     SIGNIFICANT DIAGNOSTIC EXAMS       ASSESSMENT/ PLAN:      Patient is being discharged with the following home health services:    Patient is being discharged with the following durable medical equipment:    Patient has been advised to f/u with their PCP in 1-2 weeks to bring them up to date on their rehab stay.  Social services at facility was responsible for arranging this appointment.  Pt was provided with a 30 day supply of prescriptions for medications and refills must be obtained from their PCP.  For controlled substances, a more limited supply may be provided adequate until PCP appointment only.   Ok Edwards NP [facility]  Contact [phone] Monday through Friday 8am- 5pm  After hours call [phone]   This encounter was created in error - please disregard.

## 2020-05-05 NOTE — Progress Notes (Signed)
Location:   [facility] Room Number: 496 PRFFM of Service:  SNF (31)    CODE STATUS: dnr  No Known Allergies  Chief Complaint  Patient presents with  . Discharge Note    discharge to hospice house.     HPI:  She is being discharged to the hospice home today. She will not need dme; home health or medications written. Her medical will be provided at the hospice house. She had been hospitalized for chf. The focus of her care became comfort. Her family desires for her last days to spent at the hospice home.    Past Medical History:  Diagnosis Date  . Atrial fibrillation ([place])   . CAD (coronary artery disease)    Multivessel, DES to LAD and RCA 08/2016  . Cardiomyopathy (HCC)    LVEF 45%  . CKD (chronic kidney disease) stage 5, GFR less than 15 ml/min (HCC)   . Essential hypertension   . Gout   . Pancreatitis, recurrent   . Type 2 diabetes mellitus ([place])     Past Surgical History:  Procedure Laterality Date  . CHOLECYSTECTOMY    . CORONARY STENT INTERVENTION N/A 09/08/2016   Procedure: Coronary Stent Intervention;  Surgeon: Belva Crome, MD;  Location: [place] [facility];  Service: Cardiovascular;  Laterality: N/A;  . LEFT HEART CATH AND CORONARY ANGIOGRAPHY N/A 09/03/2016   Procedure: Left Heart Cath and Coronary Angiography;  Surgeon: Nelva Bush, MD;  Location: [place] [facility];  Service: Cardiovascular;  Laterality: N/A;  . TONSILLECTOMY      Social History   Socioeconomic History  . Marital status: Widowed    Spouse name: Not on file  . Number of children: Not on file  . Years of education: Not on file  . Highest education level: Not on file  Occupational History  . Not on file  Tobacco Use  . Smoking status: Never Smoker  . Smokeless tobacco: Never Used  Vaping Use  . Vaping Use: Never used  Substance and Sexual Activity  . Alcohol use: No  . Drug use: No  . Sexual activity: Not on file  Other Topics Concern  . Not on file    Social History Narrative  . Not on file   Social Determinants of Health   Financial Resource Strain:   . Difficulty of Paying Living Expenses: Not on file  Food Insecurity:   . Worried About Charity fundraiser in the Last Year: Not on file  . Ran Out of Food in the Last Year: Not on file  Transportation Needs:   . Lack of Transportation (Medical): Not on file  . Lack of Transportation (Non-Medical): Not on file  Physical Activity:   . Days of Exercise per Week: Not on file  . Minutes of Exercise per Session: Not on file  Stress:   . Feeling of Stress : Not on file  Social Connections:   . Frequency of Communication with Friends and Family: Not on file  . Frequency of Social Gatherings with Friends and Family: Not on file  . Attends Religious Services: Not on file  . Active Member of Clubs or Organizations: Not on file  . Attends Archivist Meetings: Not on file  . Marital Status: Not on file  Intimate Partner Violence:   . Fear of Current or Ex-Partner: Not on file  . Emotionally Abused: Not on file  . Physically Abused: Not on file  . Sexually Abused:  Not on file   Family History  Problem Relation Age of Onset  . Heart disease Father     VITAL SIGNS BP 132/80   Pulse 80   Ht 5\' 6"  (1.676 m)   Wt 266 lb (120.7 kg)   BMI 42.93 kg/m   Patient's Medications  New Prescriptions   No medications on file  Previous Medications   ACETAMINOPHEN (TYLENOL) 500 MG TABLET    Take 2 tablets (1,000 mg total) by mouth 3 (three) times daily.   BALSAM PERU-CASTOR OIL (VENELEX) OINT    Apply topically in the morning, at noon, and at bedtime. Special Instructions: Apply to sacrum and bilateral buttocks qshift for Moisture Associated damage.   FEEDING SUPPLEMENT, ENSURE ENLIVE, (ENSURE ENLIVE) LIQD    Take 237 mLs by mouth 2 (two) times daily between meals.   LOPERAMIDE (IMODIUM) 2 MG CAPSULE    Take 2 mg by mouth daily as needed for diarrhea or loose stools.   LORAZEPAM  (ATIVAN) 0.5 MG TABLET    Take 0.5 mg by mouth every 6 (six) hours as needed for anxiety (Agitation).   MORPHINE (ROXANOL) 20 MG/ML CONCENTRATED SOLUTION    Take 0.25 mLs (5 mg total) by mouth every 3 (three) hours as needed for severe pain (AND give 5 mg eveyr 6 hours routinely). Give 0.5 ml for pain per hospice orders   ONDANSETRON (ZOFRAN) 4 MG TABLET    Take 1 tablet (4 mg total) by mouth every 6 (six) hours as needed for nausea.   OXYGEN    Inhale 2 L into the lungs continuous. Document O2 sat every shift   UNABLE TO FIND    Diet: NAS  Modified Medications   No medications on file  Discontinued Medications   No medications on file     SIGNIFICANT DIAGNOSTIC EXAMS   PREVIOUS   04-08-20: chest x-ray:  1. Small bilateral pleural effusions with bibasilar atelectasis. 2. Diffuse interstitial prominence likely reflects pulmonary edema.  04-09-20: 2-d echo:  Left ventricular ejection fraction, by estimation, is 60 to 65%. The  left ventricle has normal function. The left ventricle has no regional  wall motion abnormalities. There is mild left ventricular hypertrophy.  Left ventricular diastolic parameters  are indeterminate in the setting of atrial fibrillation.   NO NEW EXAMS.   PREVIOUS   04-08-20: wbc 10.2; hgb 8.0; hct 28.1; mcv 94.9 plt 361; glucose 185; bun 60; creat 2.96; k+ 2.9; na++ 140; ca 8.6 liver normal albumin 2.0; hgb a1c 7.5 BNP 468.0 mag 1.6 04-10-20: glucose 209; bun 64; creat 2.88; k+ 3.3; na++ 136; ca 8.6 04-12-20: glucose 182; bun 74; creat 2.82; k+ 2.4; na++ 135; ca 8.9 mag 1.8 04-13-20: glucose 190; bun 78; creat 2.97; k+ 3.2; na++ 132; ca 8.8 04-14-20; wbc 11.1; hgb 7.2; hct 25.5; mcv 94.4 plt 347; glucose 201; bun 86; creat 3.10; k+ 3.8; na++ 133; ca 8.7 mag 1.9 04-15-20: glucose 203; bun 92; creat 3.39 k+ 3.6; na++ 130; ca 9.0  04-21-20: glucose 103; bun 106; creat 3.73; k+ 2.5; na++ 134; ca 8.9 uric acid 5.7  04-22-20: glucose 119; bun 107; creat 3.83; k+ 3.3; na++  134 ca 8.9   NO NEW LABS.   Review of Systems  Constitutional: Negative for malaise/fatigue.  Respiratory: Positive for shortness of breath. Negative for cough.   Cardiovascular: Positive for leg swelling. Negative for chest pain and palpitations.  Gastrointestinal: Negative for abdominal pain, constipation and heartburn.  Musculoskeletal: Negative for back pain, joint  pain and myalgias.  Skin: Negative.   Neurological: Negative for dizziness.  Psychiatric/Behavioral: The patient is not nervous/anxious.     Physical Exam Constitutional:      General: She is not in acute distress.    Appearance: She is well-developed. She is obese. She is not diaphoretic.  Neck:     Thyroid: No thyromegaly.  Cardiovascular:     Rate and Rhythm: Normal rate. Rhythm irregular.     Pulses: Normal pulses.     Heart sounds: Murmur heard.   Pulmonary:     Effort: Pulmonary effort is normal. No respiratory distress.     Breath sounds: Normal breath sounds.     Comments: 02 Abdominal:     General: Bowel sounds are normal. There is no distension.     Palpations: Abdomen is soft.     Tenderness: There is no abdominal tenderness.  Musculoskeletal:     Cervical back: Neck supple.     Right lower leg: Edema present.     Left lower leg: Edema present.     Comments:  Bilateral lower extremity lymphedema Is able to move all extremities         Lymphadenopathy:     Cervical: No cervical adenopathy.  Skin:    General: Skin is warm and dry.  Neurological:     Mental Status: She is alert and oriented to person, place, and time.  Psychiatric:        Mood and Affect: Mood normal.       ASSESSMENT/ PLAN:  Patient is being discharged with the following home health services:  None needed   Patient is being discharged with the following durable medical equipment:  None needed   Patient has been advised to f/u with their PCP in 1-2 weeks to bring them up to date on their rehab stay.  Social services at  facility was responsible for arranging this appointment.  Pt was provided with a 30 day supply of prescriptions for medications and refills must be obtained from their PCP.  For controlled substances, a more limited supply may be provided adequate until PCP appointment only.  Hospice home will provide all medications and medical care.     Ok Edwards NP [facility]  Contact [phone] Monday through Friday 8am- 5pm  After hours call [phone]

## 2020-05-09 ENCOUNTER — Ambulatory Visit: Payer: PPO | Admitting: Cardiology

## 2020-05-19 DEATH — deceased

## 2020-05-23 ENCOUNTER — Telehealth: Payer: PPO | Admitting: Cardiology
